# Patient Record
Sex: Female | Born: 1940 | Race: White | Hispanic: No | Marital: Married | Smoking: Former smoker
Health system: Southern US, Community
[De-identification: ages and names within clinical notes are randomized; demographics above are authoritative.]

## PROBLEM LIST (undated history)

## (undated) DIAGNOSIS — F0390 Unspecified dementia without behavioral disturbance: Secondary | ICD-10-CM

## (undated) DIAGNOSIS — I10 Essential (primary) hypertension: Secondary | ICD-10-CM

## (undated) DIAGNOSIS — I2510 Atherosclerotic heart disease of native coronary artery without angina pectoris: Secondary | ICD-10-CM

## (undated) DIAGNOSIS — I4892 Unspecified atrial flutter: Secondary | ICD-10-CM

## (undated) DIAGNOSIS — I480 Paroxysmal atrial fibrillation: Secondary | ICD-10-CM

## (undated) DIAGNOSIS — I6529 Occlusion and stenosis of unspecified carotid artery: Secondary | ICD-10-CM

## (undated) DIAGNOSIS — I340 Nonrheumatic mitral (valve) insufficiency: Secondary | ICD-10-CM

## (undated) DIAGNOSIS — F32A Depression, unspecified: Secondary | ICD-10-CM

## (undated) DIAGNOSIS — E785 Hyperlipidemia, unspecified: Secondary | ICD-10-CM

## (undated) DIAGNOSIS — F419 Anxiety disorder, unspecified: Secondary | ICD-10-CM

## (undated) DIAGNOSIS — K219 Gastro-esophageal reflux disease without esophagitis: Secondary | ICD-10-CM

## (undated) DIAGNOSIS — Z9289 Personal history of other medical treatment: Secondary | ICD-10-CM

## (undated) DIAGNOSIS — G459 Transient cerebral ischemic attack, unspecified: Secondary | ICD-10-CM

## (undated) DIAGNOSIS — F329 Major depressive disorder, single episode, unspecified: Secondary | ICD-10-CM

## (undated) DIAGNOSIS — Z720 Tobacco use: Secondary | ICD-10-CM

## (undated) HISTORY — DX: Anxiety disorder, unspecified: F41.9

## (undated) HISTORY — DX: Gastro-esophageal reflux disease without esophagitis: K21.9

## (undated) HISTORY — DX: Personal history of other medical treatment: Z92.89

## (undated) HISTORY — DX: Essential (primary) hypertension: I10

## (undated) HISTORY — DX: Nonrheumatic mitral (valve) insufficiency: I34.0

## (undated) HISTORY — PX: A FLUTTER ABLATION: SHX5348

## (undated) HISTORY — DX: Occlusion and stenosis of unspecified carotid artery: I65.29

## (undated) HISTORY — DX: Transient cerebral ischemic attack, unspecified: G45.9

## (undated) HISTORY — DX: Major depressive disorder, single episode, unspecified: F32.9

## (undated) HISTORY — DX: Depression, unspecified: F32.A

## (undated) HISTORY — PX: MITRAL VALVE REPLACEMENT: SHX147

## (undated) HISTORY — DX: Hyperlipidemia, unspecified: E78.5

---

## 2000-09-11 ENCOUNTER — Emergency Department (HOSPITAL_COMMUNITY): Admission: EM | Admit: 2000-09-11 | Discharge: 2000-09-11 | Payer: Self-pay | Admitting: Emergency Medicine

## 2000-12-05 ENCOUNTER — Emergency Department (HOSPITAL_COMMUNITY): Admission: EM | Admit: 2000-12-05 | Discharge: 2000-12-05 | Payer: Self-pay | Admitting: Emergency Medicine

## 2000-12-05 ENCOUNTER — Encounter: Payer: Self-pay | Admitting: Emergency Medicine

## 2001-04-08 ENCOUNTER — Other Ambulatory Visit: Admission: RE | Admit: 2001-04-08 | Discharge: 2001-04-08 | Payer: Self-pay | Admitting: Family Medicine

## 2003-04-30 ENCOUNTER — Inpatient Hospital Stay (HOSPITAL_COMMUNITY): Admission: EM | Admit: 2003-04-30 | Discharge: 2003-05-02 | Payer: Self-pay | Admitting: Emergency Medicine

## 2004-03-15 ENCOUNTER — Ambulatory Visit: Payer: Self-pay | Admitting: Internal Medicine

## 2004-03-29 ENCOUNTER — Ambulatory Visit: Payer: Self-pay | Admitting: Internal Medicine

## 2004-04-26 ENCOUNTER — Ambulatory Visit: Payer: Self-pay | Admitting: *Deleted

## 2004-05-24 ENCOUNTER — Ambulatory Visit: Payer: Self-pay | Admitting: Internal Medicine

## 2004-05-26 ENCOUNTER — Emergency Department (HOSPITAL_COMMUNITY): Admission: EM | Admit: 2004-05-26 | Discharge: 2004-05-26 | Payer: Self-pay | Admitting: Emergency Medicine

## 2004-06-21 ENCOUNTER — Ambulatory Visit: Payer: Self-pay | Admitting: Cardiology

## 2004-07-19 ENCOUNTER — Ambulatory Visit: Payer: Self-pay | Admitting: *Deleted

## 2004-08-16 ENCOUNTER — Ambulatory Visit: Payer: Self-pay | Admitting: Internal Medicine

## 2004-09-06 ENCOUNTER — Ambulatory Visit: Payer: Self-pay | Admitting: Cardiology

## 2004-09-06 ENCOUNTER — Emergency Department (HOSPITAL_COMMUNITY): Admission: EM | Admit: 2004-09-06 | Discharge: 2004-09-06 | Payer: Self-pay | Admitting: Emergency Medicine

## 2004-09-10 ENCOUNTER — Ambulatory Visit: Payer: Self-pay

## 2004-09-13 ENCOUNTER — Ambulatory Visit: Payer: Self-pay | Admitting: Internal Medicine

## 2004-10-04 ENCOUNTER — Ambulatory Visit: Payer: Self-pay | Admitting: Cardiology

## 2004-11-01 ENCOUNTER — Ambulatory Visit: Payer: Self-pay | Admitting: Cardiology

## 2004-11-15 ENCOUNTER — Ambulatory Visit: Payer: Self-pay | Admitting: Internal Medicine

## 2004-11-29 ENCOUNTER — Ambulatory Visit: Payer: Self-pay | Admitting: Cardiology

## 2004-12-13 ENCOUNTER — Ambulatory Visit: Payer: Self-pay | Admitting: Internal Medicine

## 2004-12-27 ENCOUNTER — Ambulatory Visit: Payer: Self-pay | Admitting: Internal Medicine

## 2005-01-09 ENCOUNTER — Ambulatory Visit: Payer: Self-pay | Admitting: *Deleted

## 2005-01-09 ENCOUNTER — Ambulatory Visit: Payer: Self-pay | Admitting: Cardiology

## 2005-01-24 ENCOUNTER — Ambulatory Visit: Payer: Self-pay | Admitting: Internal Medicine

## 2005-02-14 ENCOUNTER — Ambulatory Visit: Payer: Self-pay | Admitting: Cardiology

## 2005-03-07 ENCOUNTER — Ambulatory Visit: Payer: Self-pay | Admitting: Cardiology

## 2005-04-05 ENCOUNTER — Ambulatory Visit: Payer: Self-pay | Admitting: Internal Medicine

## 2005-05-02 ENCOUNTER — Ambulatory Visit: Payer: Self-pay | Admitting: Cardiology

## 2005-05-30 ENCOUNTER — Ambulatory Visit: Payer: Self-pay | Admitting: Cardiology

## 2005-06-13 ENCOUNTER — Ambulatory Visit: Payer: Self-pay | Admitting: Cardiology

## 2005-07-11 ENCOUNTER — Ambulatory Visit: Payer: Self-pay | Admitting: Cardiology

## 2005-08-08 ENCOUNTER — Ambulatory Visit: Payer: Self-pay | Admitting: Cardiology

## 2005-08-11 DIAGNOSIS — I2510 Atherosclerotic heart disease of native coronary artery without angina pectoris: Secondary | ICD-10-CM

## 2005-08-11 HISTORY — DX: Atherosclerotic heart disease of native coronary artery without angina pectoris: I25.10

## 2005-08-23 ENCOUNTER — Ambulatory Visit: Payer: Self-pay | Admitting: Cardiology

## 2005-08-23 ENCOUNTER — Inpatient Hospital Stay (HOSPITAL_COMMUNITY): Admission: EM | Admit: 2005-08-23 | Discharge: 2005-08-25 | Payer: Self-pay | Admitting: Emergency Medicine

## 2005-08-29 ENCOUNTER — Ambulatory Visit: Payer: Self-pay

## 2005-09-03 ENCOUNTER — Ambulatory Visit: Payer: Self-pay | Admitting: Cardiology

## 2005-09-05 ENCOUNTER — Ambulatory Visit (HOSPITAL_COMMUNITY): Admission: RE | Admit: 2005-09-05 | Discharge: 2005-09-05 | Payer: Self-pay | Admitting: Cardiology

## 2005-09-05 ENCOUNTER — Ambulatory Visit: Payer: Self-pay | Admitting: Cardiology

## 2005-09-12 ENCOUNTER — Ambulatory Visit: Payer: Self-pay | Admitting: Cardiology

## 2005-09-19 ENCOUNTER — Ambulatory Visit: Payer: Self-pay | Admitting: Cardiology

## 2005-10-02 ENCOUNTER — Ambulatory Visit: Payer: Self-pay | Admitting: Internal Medicine

## 2005-10-08 ENCOUNTER — Ambulatory Visit: Payer: Self-pay | Admitting: Internal Medicine

## 2005-10-08 ENCOUNTER — Ambulatory Visit: Payer: Self-pay | Admitting: Cardiology

## 2005-10-08 ENCOUNTER — Ambulatory Visit: Payer: Self-pay

## 2005-10-10 ENCOUNTER — Ambulatory Visit: Payer: Self-pay | Admitting: Internal Medicine

## 2005-10-10 ENCOUNTER — Ambulatory Visit (HOSPITAL_COMMUNITY): Admission: RE | Admit: 2005-10-10 | Discharge: 2005-10-11 | Payer: Self-pay | Admitting: Internal Medicine

## 2005-10-15 ENCOUNTER — Ambulatory Visit: Payer: Self-pay | Admitting: Cardiology

## 2005-10-24 ENCOUNTER — Ambulatory Visit: Payer: Self-pay | Admitting: Cardiology

## 2005-11-05 ENCOUNTER — Ambulatory Visit: Payer: Self-pay | Admitting: Internal Medicine

## 2005-11-14 ENCOUNTER — Ambulatory Visit: Payer: Self-pay | Admitting: Cardiology

## 2005-11-21 ENCOUNTER — Ambulatory Visit: Payer: Self-pay | Admitting: Cardiology

## 2005-12-05 ENCOUNTER — Ambulatory Visit: Payer: Self-pay | Admitting: Internal Medicine

## 2005-12-19 ENCOUNTER — Ambulatory Visit: Payer: Self-pay | Admitting: Cardiology

## 2005-12-26 ENCOUNTER — Ambulatory Visit: Payer: Self-pay | Admitting: Cardiology

## 2006-01-09 ENCOUNTER — Ambulatory Visit: Payer: Self-pay | Admitting: Cardiology

## 2006-01-30 ENCOUNTER — Ambulatory Visit: Payer: Self-pay | Admitting: Cardiology

## 2006-02-20 ENCOUNTER — Ambulatory Visit: Payer: Self-pay | Admitting: Cardiology

## 2006-03-07 ENCOUNTER — Ambulatory Visit: Payer: Self-pay | Admitting: Internal Medicine

## 2006-04-04 ENCOUNTER — Ambulatory Visit: Payer: Self-pay | Admitting: Cardiology

## 2006-04-10 ENCOUNTER — Ambulatory Visit: Payer: Self-pay | Admitting: Internal Medicine

## 2006-04-24 ENCOUNTER — Ambulatory Visit: Payer: Self-pay | Admitting: Cardiology

## 2006-05-15 ENCOUNTER — Ambulatory Visit: Payer: Self-pay | Admitting: Cardiology

## 2006-05-29 ENCOUNTER — Ambulatory Visit: Payer: Self-pay | Admitting: *Deleted

## 2006-06-12 ENCOUNTER — Ambulatory Visit: Payer: Self-pay | Admitting: Cardiology

## 2006-06-26 ENCOUNTER — Ambulatory Visit: Payer: Self-pay | Admitting: Internal Medicine

## 2006-07-17 ENCOUNTER — Ambulatory Visit: Payer: Self-pay | Admitting: Cardiology

## 2006-08-07 ENCOUNTER — Ambulatory Visit: Payer: Self-pay | Admitting: Cardiology

## 2006-08-28 ENCOUNTER — Ambulatory Visit: Payer: Self-pay | Admitting: Internal Medicine

## 2006-09-18 ENCOUNTER — Ambulatory Visit: Payer: Self-pay | Admitting: Cardiology

## 2006-10-09 ENCOUNTER — Ambulatory Visit: Payer: Self-pay | Admitting: *Deleted

## 2006-10-30 ENCOUNTER — Ambulatory Visit: Payer: Self-pay | Admitting: Internal Medicine

## 2006-11-20 ENCOUNTER — Ambulatory Visit: Payer: Self-pay | Admitting: Cardiovascular Disease

## 2006-12-18 ENCOUNTER — Ambulatory Visit: Payer: Self-pay | Admitting: Cardiovascular Disease

## 2007-01-15 ENCOUNTER — Ambulatory Visit: Payer: Self-pay | Admitting: Cardiology

## 2007-01-29 ENCOUNTER — Ambulatory Visit: Payer: Self-pay | Admitting: Cardiology

## 2007-02-12 ENCOUNTER — Ambulatory Visit: Payer: Self-pay | Admitting: Cardiology

## 2007-03-05 ENCOUNTER — Ambulatory Visit: Payer: Self-pay | Admitting: Cardiology

## 2007-03-18 ENCOUNTER — Ambulatory Visit: Payer: Self-pay | Admitting: Cardiology

## 2007-03-18 LAB — CONVERTED CEMR LAB
ALT: 22 units/L (ref 0–35)
AST: 21 units/L (ref 0–37)
Basophils Relative: 0 % (ref 0.0–1.0)
Bilirubin, Direct: 0.1 mg/dL (ref 0.0–0.3)
CO2: 32 meq/L (ref 19–32)
Calcium: 9.7 mg/dL (ref 8.4–10.5)
Chloride: 104 meq/L (ref 96–112)
Creatinine, Ser: 0.6 mg/dL (ref 0.4–1.2)
Eosinophils Relative: 2.8 % (ref 0.0–5.0)
GFR calc non Af Amer: 106 mL/min
Glucose, Bld: 104 mg/dL — ABNORMAL HIGH (ref 70–99)
HCT: 39.9 % (ref 36.0–46.0)
Neutrophils Relative %: 70.6 % (ref 43.0–77.0)
Platelets: 249 10*3/uL (ref 150–400)
RBC: 4.69 M/uL (ref 3.87–5.11)
RDW: 12.7 % (ref 11.5–14.6)
Sodium: 140 meq/L (ref 135–145)
Total Bilirubin: 0.6 mg/dL (ref 0.3–1.2)
Total Protein: 6.9 g/dL (ref 6.0–8.3)
WBC: 11.1 10*3/uL — ABNORMAL HIGH (ref 4.5–10.5)

## 2007-04-02 ENCOUNTER — Ambulatory Visit: Payer: Self-pay | Admitting: Cardiology

## 2007-04-30 ENCOUNTER — Ambulatory Visit: Payer: Self-pay | Admitting: Cardiology

## 2007-05-21 ENCOUNTER — Ambulatory Visit: Payer: Self-pay | Admitting: Internal Medicine

## 2007-05-28 ENCOUNTER — Ambulatory Visit: Payer: Self-pay | Admitting: Internal Medicine

## 2007-06-25 ENCOUNTER — Ambulatory Visit: Payer: Self-pay | Admitting: Cardiology

## 2007-07-06 ENCOUNTER — Ambulatory Visit: Payer: Self-pay | Admitting: Cardiology

## 2007-07-06 ENCOUNTER — Inpatient Hospital Stay (HOSPITAL_COMMUNITY): Admission: AD | Admit: 2007-07-06 | Discharge: 2007-07-07 | Payer: Self-pay | Admitting: Cardiology

## 2007-07-14 ENCOUNTER — Ambulatory Visit: Payer: Self-pay | Admitting: Internal Medicine

## 2007-07-14 ENCOUNTER — Inpatient Hospital Stay (HOSPITAL_COMMUNITY): Admission: RE | Admit: 2007-07-14 | Discharge: 2007-07-15 | Payer: Self-pay | Admitting: Internal Medicine

## 2007-07-15 ENCOUNTER — Encounter: Payer: Self-pay | Admitting: Internal Medicine

## 2007-07-17 ENCOUNTER — Ambulatory Visit: Payer: Self-pay | Admitting: Cardiology

## 2007-07-30 ENCOUNTER — Ambulatory Visit: Payer: Self-pay | Admitting: Cardiovascular Disease

## 2007-07-30 ENCOUNTER — Inpatient Hospital Stay (HOSPITAL_COMMUNITY): Admission: EM | Admit: 2007-07-30 | Discharge: 2007-07-31 | Payer: Self-pay | Admitting: Emergency Medicine

## 2007-08-05 ENCOUNTER — Ambulatory Visit: Payer: Self-pay

## 2007-08-13 ENCOUNTER — Ambulatory Visit: Payer: Self-pay | Admitting: Internal Medicine

## 2007-08-13 LAB — CONVERTED CEMR LAB: Prothrombin Time: 27 s — ABNORMAL HIGH (ref 10.9–13.3)

## 2007-08-20 ENCOUNTER — Ambulatory Visit: Payer: Self-pay | Admitting: Internal Medicine

## 2007-08-20 ENCOUNTER — Ambulatory Visit: Payer: Self-pay | Admitting: Cardiology

## 2007-08-20 ENCOUNTER — Encounter: Payer: Self-pay | Admitting: Cardiology

## 2007-08-20 ENCOUNTER — Ambulatory Visit: Payer: Self-pay

## 2007-09-10 ENCOUNTER — Ambulatory Visit: Payer: Self-pay | Admitting: Cardiology

## 2007-10-01 ENCOUNTER — Ambulatory Visit: Payer: Self-pay | Admitting: Internal Medicine

## 2007-10-15 ENCOUNTER — Ambulatory Visit: Payer: Self-pay | Admitting: Internal Medicine

## 2007-10-29 ENCOUNTER — Ambulatory Visit: Payer: Self-pay | Admitting: Cardiology

## 2007-11-19 ENCOUNTER — Ambulatory Visit: Payer: Self-pay | Admitting: Cardiology

## 2007-12-17 ENCOUNTER — Ambulatory Visit: Payer: Self-pay | Admitting: Cardiology

## 2007-12-28 ENCOUNTER — Ambulatory Visit: Payer: Self-pay | Admitting: Cardiology

## 2008-01-14 ENCOUNTER — Ambulatory Visit: Payer: Self-pay | Admitting: Internal Medicine

## 2008-02-04 ENCOUNTER — Ambulatory Visit: Payer: Self-pay | Admitting: Cardiology

## 2008-02-25 ENCOUNTER — Ambulatory Visit: Payer: Self-pay | Admitting: Internal Medicine

## 2008-03-10 ENCOUNTER — Ambulatory Visit: Payer: Self-pay | Admitting: Cardiovascular Disease

## 2008-03-31 ENCOUNTER — Ambulatory Visit: Payer: Self-pay | Admitting: Internal Medicine

## 2008-04-14 ENCOUNTER — Ambulatory Visit: Payer: Self-pay | Admitting: Internal Medicine

## 2008-05-12 ENCOUNTER — Ambulatory Visit: Payer: Self-pay | Admitting: Cardiology

## 2008-06-02 ENCOUNTER — Ambulatory Visit: Payer: Self-pay | Admitting: Cardiology

## 2008-06-30 ENCOUNTER — Ambulatory Visit: Payer: Self-pay | Admitting: Cardiology

## 2008-06-30 LAB — CONVERTED CEMR LAB: INR: 2.8 — ABNORMAL HIGH (ref 0.0–1.5)

## 2008-07-21 ENCOUNTER — Ambulatory Visit: Payer: Self-pay | Admitting: Cardiology

## 2008-08-18 ENCOUNTER — Ambulatory Visit: Payer: Self-pay | Admitting: Cardiology

## 2008-08-23 ENCOUNTER — Telehealth: Payer: Self-pay | Admitting: Cardiology

## 2008-08-25 ENCOUNTER — Ambulatory Visit: Payer: Self-pay | Admitting: Cardiology

## 2008-09-15 ENCOUNTER — Ambulatory Visit: Payer: Self-pay | Admitting: Cardiology

## 2008-09-21 DIAGNOSIS — F17200 Nicotine dependence, unspecified, uncomplicated: Secondary | ICD-10-CM | POA: Insufficient documentation

## 2008-09-21 DIAGNOSIS — Z9889 Other specified postprocedural states: Secondary | ICD-10-CM | POA: Insufficient documentation

## 2008-09-21 DIAGNOSIS — E785 Hyperlipidemia, unspecified: Secondary | ICD-10-CM

## 2008-09-21 DIAGNOSIS — I10 Essential (primary) hypertension: Secondary | ICD-10-CM

## 2008-09-21 DIAGNOSIS — I480 Paroxysmal atrial fibrillation: Secondary | ICD-10-CM | POA: Insufficient documentation

## 2008-09-22 ENCOUNTER — Ambulatory Visit: Payer: Self-pay | Admitting: Cardiology

## 2008-09-23 ENCOUNTER — Ambulatory Visit: Payer: Self-pay | Admitting: Cardiology

## 2008-09-23 LAB — CONVERTED CEMR LAB
Basophils Relative: 0.8 % (ref 0.0–3.0)
Bilirubin, Direct: 0 mg/dL (ref 0.0–0.3)
CO2: 30 meq/L (ref 19–32)
Calcium: 9.4 mg/dL (ref 8.4–10.5)
HCT: 44.7 % (ref 36.0–46.0)
HDL: 41.4 mg/dL (ref >39.00)
Hemoglobin: 15.3 g/dL — ABNORMAL HIGH (ref 12.0–15.0)
Lymphocytes Relative: 23.8 % (ref 12.0–46.0)
MCHC: 34.3 g/dL (ref 30.0–36.0)
Monocytes Relative: 7.3 % (ref 3.0–12.0)
Neutro Abs: 5.7 10*3/uL (ref 1.4–7.7)
Potassium: 4.4 meq/L (ref 3.5–5.1)
RBC: 4.97 M/uL (ref 3.87–5.11)
Sodium: 138 meq/L (ref 135–145)
Total Bilirubin: 0.9 mg/dL (ref 0.3–1.2)
VLDL: 22.8 mg/dL (ref 0.0–40.0)

## 2008-10-12 ENCOUNTER — Ambulatory Visit: Payer: Self-pay | Admitting: Internal Medicine

## 2008-11-10 ENCOUNTER — Ambulatory Visit: Payer: Self-pay | Admitting: Internal Medicine

## 2008-11-30 ENCOUNTER — Ambulatory Visit: Payer: Self-pay | Admitting: Cardiology

## 2008-12-26 ENCOUNTER — Encounter: Payer: Self-pay | Admitting: *Deleted

## 2008-12-29 ENCOUNTER — Ambulatory Visit: Payer: Self-pay | Admitting: Cardiology

## 2008-12-29 LAB — CONVERTED CEMR LAB
POC INR: 4
Prothrombin Time: 24.4 s

## 2009-01-26 ENCOUNTER — Ambulatory Visit: Payer: Self-pay | Admitting: Cardiology

## 2009-01-26 LAB — CONVERTED CEMR LAB: POC INR: 4.1

## 2009-02-23 ENCOUNTER — Ambulatory Visit: Payer: Self-pay | Admitting: Cardiology

## 2009-02-23 LAB — CONVERTED CEMR LAB: POC INR: 3.5

## 2009-03-14 ENCOUNTER — Encounter (INDEPENDENT_AMBULATORY_CARE_PROVIDER_SITE_OTHER): Payer: Self-pay | Admitting: *Deleted

## 2009-03-23 ENCOUNTER — Ambulatory Visit: Payer: Self-pay | Admitting: Cardiology

## 2009-04-08 ENCOUNTER — Inpatient Hospital Stay (HOSPITAL_COMMUNITY): Admission: EM | Admit: 2009-04-08 | Discharge: 2009-04-10 | Payer: Self-pay | Admitting: Emergency Medicine

## 2009-04-17 ENCOUNTER — Ambulatory Visit: Payer: Self-pay | Admitting: Family Medicine

## 2009-04-20 ENCOUNTER — Ambulatory Visit: Payer: Self-pay | Admitting: Cardiology

## 2009-04-28 ENCOUNTER — Encounter: Payer: Self-pay | Admitting: Internal Medicine

## 2009-04-28 ENCOUNTER — Ambulatory Visit: Payer: Self-pay | Admitting: Internal Medicine

## 2009-05-18 ENCOUNTER — Ambulatory Visit: Payer: Self-pay | Admitting: Cardiology

## 2009-06-15 ENCOUNTER — Ambulatory Visit: Payer: Self-pay | Admitting: Cardiovascular Disease

## 2009-07-11 ENCOUNTER — Ambulatory Visit: Payer: Self-pay | Admitting: Neurology

## 2009-07-13 ENCOUNTER — Ambulatory Visit: Payer: Self-pay | Admitting: Cardiovascular Disease

## 2009-08-10 ENCOUNTER — Ambulatory Visit: Payer: Self-pay | Admitting: Cardiovascular Disease

## 2009-09-07 ENCOUNTER — Ambulatory Visit: Payer: Self-pay | Admitting: Cardiology

## 2009-09-07 LAB — CONVERTED CEMR LAB: POC INR: 3

## 2009-10-05 ENCOUNTER — Ambulatory Visit: Payer: Self-pay | Admitting: Cardiovascular Disease

## 2009-10-05 LAB — CONVERTED CEMR LAB: POC INR: 2.8

## 2009-10-26 ENCOUNTER — Ambulatory Visit: Payer: Self-pay | Admitting: Cardiovascular Disease

## 2009-11-06 ENCOUNTER — Encounter: Payer: Self-pay | Admitting: Cardiovascular Disease

## 2009-11-07 ENCOUNTER — Ambulatory Visit: Payer: Self-pay | Admitting: Cardiology

## 2009-11-10 LAB — CONVERTED CEMR LAB
BUN: 9 mg/dL (ref 6–23)
Eosinophils Relative: 2.4 % (ref 0.0–5.0)
GFR calc non Af Amer: 94.24 mL/min (ref >60)
HCT: 42.8 % (ref 36.0–46.0)
Hemoglobin: 14.2 g/dL (ref 12.0–15.0)
Lymphs Abs: 2.5 10*3/uL (ref 0.7–4.0)
Monocytes Relative: 5.6 % (ref 3.0–12.0)
Platelets: 258 10*3/uL (ref 150.0–400.0)
Potassium: 4.5 meq/L (ref 3.5–5.1)
Sodium: 136 meq/L (ref 135–145)
TSH: 1.45 microintl units/mL (ref 0.35–5.50)
WBC: 10.4 10*3/uL (ref 4.5–10.5)

## 2009-11-22 ENCOUNTER — Ambulatory Visit: Payer: Self-pay | Admitting: Cardiovascular Disease

## 2009-12-13 ENCOUNTER — Ambulatory Visit: Payer: Self-pay | Admitting: Cardiovascular Disease

## 2009-12-13 LAB — CONVERTED CEMR LAB: POC INR: 3.7

## 2010-01-10 ENCOUNTER — Ambulatory Visit: Payer: Self-pay | Admitting: Cardiology

## 2010-01-10 LAB — CONVERTED CEMR LAB: POC INR: 3.2

## 2010-02-07 ENCOUNTER — Ambulatory Visit: Payer: Self-pay | Admitting: Cardiology

## 2010-02-07 LAB — CONVERTED CEMR LAB: POC INR: 3.2

## 2010-03-12 ENCOUNTER — Ambulatory Visit: Payer: Self-pay | Admitting: Cardiovascular Disease

## 2010-04-18 ENCOUNTER — Ambulatory Visit: Payer: Self-pay | Admitting: Cardiovascular Disease

## 2010-05-23 ENCOUNTER — Ambulatory Visit: Admission: RE | Admit: 2010-05-23 | Discharge: 2010-05-23 | Payer: Self-pay | Source: Home / Self Care

## 2010-05-23 LAB — CONVERTED CEMR LAB: POC INR: 4

## 2010-06-12 NOTE — Medication Information (Signed)
Summary: rov/ewj  Anticoagulant Therapy  Managed by: Cloyde Reams, RN, BSN Referring MD: Charlies Constable MD PCP: dr Elease Hashimoto in [place] Supervising MD: Mariah Milling Indication 1: Aortic Valve Replacement (ICD-V43.3) Indication 2: Mitral Valve Disorder (ICD-424.0) Lab Used: [facility] Site: [place] INR POC 3.2 INR RANGE 3.0-4.0  Dietary changes: no    Health status changes: no    Bleeding/hemorrhagic complications: no    Recent/future hospitalizations: no    Any changes in medication regimen? no    Recent/future dental: no  Any missed doses?: no       Is patient compliant with meds? yes       Allergies: 1)  ! Codeine  Anticoagulation Management History:      The patient is taking warfarin and comes in today for a routine follow up visit.  Positive risk factors for bleeding include an age of 44 years or older.  The bleeding index is 'intermediate risk'.  Positive CHADS2 values include History of HTN.  Negative CHADS2 values include Age > 69 years old.  The start date was 09/12/1997.  Her last INR was 2.8.  Anticoagulation responsible provider: gollan.  INR POC: 3.2.  Cuvette Lot#: [id number].  Exp: 02/2011.    Anticoagulation Management Assessment/Plan:      The patient's current anticoagulation dose is Coumadin 5 mg tabs: take as directed.  The target INR is 3 - 4.  The next INR is due 02/07/2010.  Anticoagulation instructions were given to patient.  Results were reviewed/authorized by Cloyde Reams, RN, BSN.  She was notified by Cloyde Reams RN.         Prior Anticoagulation Instructions: INR 3.7  Continue on same dosage 1 tablet daily except 1.5 tablets on Mondays, Wednesdays, and Saturdays.  Recheck in 4 weeks.    Current Anticoagulation Instructions: INR 3.2  Continue on same dosage 1 tablet daily except 1.5 tablets on Mondays, Wednesdays and Saturdays.  Recheck in 4 weeks.

## 2010-06-12 NOTE — Medication Information (Signed)
Summary: CCR/AMD  Anticoagulant Therapy  Managed by: Robyn Haber, RN, BSN Referring MD: Charlies Constable MD Supervising MD: Mariah Milling Indication 1: Aortic Valve Replacement (ICD-V43.3) Indication 2: Mitral Valve Disorder (ICD-424.0) Lab Used: [facility] Site: [place] INR POC 3.0 INR RANGE 3.0-4.0  Dietary changes: no    Health status changes: no    Bleeding/hemorrhagic complications: no    Recent/future hospitalizations: no    Any changes in medication regimen? no    Recent/future dental: no  Any missed doses?: no       Is patient compliant with meds? yes       Allergies: 1)  ! Codeine  Anticoagulation Management History:      The patient is taking warfarin and comes in today for a routine follow up visit.  Positive risk factors for bleeding include an age of 71 years or older.  The bleeding index is 'intermediate risk'.  Positive CHADS2 values include History of HTN.  Negative CHADS2 values include Age > 24 years old.  The start date was 09/12/1997.  Her last INR was 2.8.  Anticoagulation responsible provider: gollan.  INR POC: 3.0.    Anticoagulation Management Assessment/Plan:      The patient's current anticoagulation dose is Coumadin 5 mg tabs: take as directed.  The target INR is 3 - 4.  The next INR is due 10/05/2009.  Anticoagulation instructions were given to patient.  Results were reviewed/authorized by Robyn Haber, RN, BSN.  She was notified by Charlena Cross, RN, BSN.         Prior Anticoagulation Instructions: coumadin 7.5 mg today then coumadin 5 mg daily with 7.5 mg on Wed and Sat  Current Anticoagulation Instructions: The patient is to continue with the same dose of coumadin.  This dosage includes: coumadin 5 mg daily with 7.5mg  on Wed and Sat

## 2010-06-12 NOTE — Medication Information (Signed)
Summary: CCR/AMD  Anticoagulant Therapy  Managed by: Robyn Haber, RN, BSN Referring MD: Charlies Constable MD Supervising MD: Mariah Milling Indication 1: Aortic Valve Replacement (ICD-V43.3) Indication 2: Mitral Valve Disorder (ICD-424.0) Lab Used: [facility]  Site: [place] INR POC 3.2 INR RANGE 3.0-4.0  Dietary changes: no    Health status changes: no    Bleeding/hemorrhagic complications: no    Recent/future hospitalizations: no    Any changes in medication regimen? no    Recent/future dental: no  Any missed doses?: no       Is patient compliant with meds? yes       Allergies: 1)  ! Codeine  Anticoagulation Management History:      The patient is taking warfarin and comes in today for a routine follow up visit.  Positive risk factors for bleeding include an age of 6 years or older.  The bleeding index is 'intermediate risk'.  Positive CHADS2 values include History of HTN.  Negative CHADS2 values include Age > 21 years old.  The start date was 09/12/1997.  Her last INR was 2.8.  Anticoagulation responsible provider: Britini Garcilazo.  INR POC: 3.2.    Anticoagulation Management Assessment/Plan:      The patient's current anticoagulation dose is Coumadin 5 mg tabs: take as directed.  The target INR is 3 - 4.  The next INR is due 07/13/2009.  Anticoagulation instructions were given to patient.  Results were reviewed/authorized by Robyn Haber, RN, BSN.  She was notified by Charlena Cross, RN, BSN.         Prior Anticoagulation Instructions: coumadin 7.5 mg today then resume dose of 5mg  daily with 7.5 mg on Wed and Sat.  Current Anticoagulation Instructions: The patient is to continue with the same dose of coumadin.  This dosage includes: coumadin 5 mg daily with 7.5 mg on Wed and Sat.

## 2010-06-12 NOTE — Medication Information (Signed)
Summary: CCR/AMD  Anticoagulant Therapy  Managed by: Cloyde Reams, RN, BSN Referring MD: Charlies Constable MD Supervising MD: Mariah Milling Indication 1: Aortic Valve Replacement (ICD-V43.3) Indication 2: Mitral Valve Disorder (ICD-424.0) Lab Used: [facility]--[place]  Site: [place] INR POC 2.8 INR RANGE 3.0-4.0    Bleeding/hemorrhagic complications: no     Any changes in medication regimen? no     Any missed doses?: no       Is patient compliant with meds? yes       Allergies: 1)  ! Codeine  Anticoagulation Management History:      The patient is taking warfarin and comes in today for a routine follow up visit.  Positive risk factors for bleeding include an age of 71 years or older.  The bleeding index is 'intermediate risk'.  Positive CHADS2 values include History of HTN.  Negative CHADS2 values include Age > 71 years old.  The start date was 09/12/1997.  Her last INR was 2.8.  Anticoagulation responsible provider: gollan.  INR POC: 2.8.  Cuvette Lot#: [id number].  Exp: 12/2010.    Anticoagulation Management Assessment/Plan:      The patient's current anticoagulation dose is Coumadin 5 mg tabs: take as directed.  The target INR is 3 - 4.  The next INR is due 10/26/2009.  Anticoagulation instructions were given to patient.  Results were reviewed/authorized by Cloyde Reams, RN, BSN.  She was notified by Cloyde Reams, RN, BSN.         Prior Anticoagulation Instructions: The patient is to continue with the same dose of coumadin.  This dosage includes: coumadin 5 mg daily with 7.5mg  on Wed and Sat  Current Anticoagulation Instructions: INR 2.8  Take 1.5 tablets today, then resume same dosage 1 tablet daily except 1.5 tablets on Wednesdays and Saturdays.  Recheck in 3 weeks.

## 2010-06-12 NOTE — Medication Information (Signed)
Summary: CCR/AMD  Anticoagulant Therapy  Managed by: Robyn Haber, RN, BSN Referring MD: Charlies Constable MD Supervising MD: Mariah Milling Indication 1: Aortic Valve Replacement (ICD-V43.3) Indication 2: Mitral Valve Disorder (ICD-424.0) Lab Used: [facility] Site:  INR POC 2.5 INR RANGE 3.0-4.0  Dietary changes: no    Health status changes: no    Bleeding/hemorrhagic complications: no    Recent/future hospitalizations: no    Any changes in medication regimen? yes       Details: started on namenda  Recent/future dental: no  Any missed doses?: no       Is patient compliant with meds? yes       Allergies: 1)  ! Codeine  Anticoagulation Management History:      The patient is taking warfarin and comes in today for a routine follow up visit.  Positive risk factors for bleeding include an age of 21 years or older.  The bleeding index is 'intermediate risk'.  Positive CHADS2 values include History of HTN.  Negative CHADS2 values include Age > 47 years old.  The start date was 09/12/1997.  Her last INR was 2.8.  Anticoagulation responsible provider: gollan.  INR POC: 2.5.    Anticoagulation Management Assessment/Plan:      The patient's current anticoagulation dose is Coumadin 5 mg tabs: take as directed.  The target INR is 3 - 4.  The next INR is due 08/10/2009.  Anticoagulation instructions were given to patient.  Results were reviewed/authorized by Robyn Haber, RN, BSN.  She was notified by Charlena Cross, RN, BSN.         Prior Anticoagulation Instructions: The patient is to continue with the same dose of coumadin.  This dosage includes: coumadin 5 mg daily with 7.5 mg on Wed and Sat.  Current Anticoagulation Instructions: coumadin 10 mg today then resume 5 mg daily with 7.5 mg on Wed and Sat

## 2010-06-12 NOTE — Medication Information (Signed)
Summary: CCR/AMD  Anticoagulant Therapy  Managed by: Cloyde Reams, RN, BSN Referring MD: Charlies Constable MD Supervising MD: Mariah Milling Indication 1: Aortic Valve Replacement (ICD-V43.3) Indication 2: Mitral Valve Disorder (ICD-424.0) Lab Used: [facility] Site: [place] INR POC 3.4 INR RANGE 3.0-4.0  Dietary changes: no    Health status changes: no    Bleeding/hemorrhagic complications: no    Recent/future hospitalizations: no    Any changes in medication regimen? no     Any missed doses?: no       Is patient compliant with meds? yes       Allergies: 1)  ! Codeine  Anticoagulation Management History:      Positive risk factors for bleeding include an age of 71 years or older.  The bleeding index is 'intermediate risk'.  Positive CHADS2 values include History of HTN.  Negative CHADS2 values include Age > 71 years old.  The start date was 09/12/1997.  Her last INR was 2.8.  Anticoagulation responsible provider: Mardie Kellen.  INR POC: 3.4.  Cuvette Lot#: [id number].  Exp: 01/2011.    Anticoagulation Management Assessment/Plan:      The patient's current anticoagulation dose is Coumadin 5 mg tabs: take as directed.  The target INR is 3 - 4.  The next INR is due 10/26/2009.  Anticoagulation instructions were given to patient.  Results were reviewed/authorized by Cloyde Reams, RN, BSN.  She was notified by Benedict Needy, RN.         Prior Anticoagulation Instructions: INR 2.8  Take 1.5 tablets today, then resume same dosage 1 tablet daily except 1.5 tablets on Wednesdays and Saturdays.  Recheck in 3 weeks.    Current Anticoagulation Instructions: INR 3.4  Continue taking 1 tablet daily except 1.5 Wednesdays and Saturday. Recheck in 4 weeks.

## 2010-06-12 NOTE — Assessment & Plan Note (Signed)
Summary: F1Y   Visit Type:  Follow-up Primary Provider:  dr Elease Hashimoto in [place]  CC:  no complaints.  History of Present Illness: [name] [name] is 70 years old and return for followup management of valvular heart disease.she had a mitral valve replacement with a Bjork-Shiley valve years ago for much regurgitation. she also has a history of TIAs and aspirin has been added to her Coumadin. 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 She also has a history of paroxysmal atrial fibrillation and had atrial flutter ablation a number of years ago.  She says she's been doing quite well has had no chest pain shortness breath or palpitations. Her other medical problems include hypertension and hyperlipidemia. Unfortunately she continues to smoke a pack and a half of cigarettes per day.  Current Medications (verified): 1)  Coumadin 5 Mg Tabs (Warfarin Sodium) .... Take As Directed 2)  Lopressor 50 Mg Tabs (Metoprolol Tartrate) .... Take 1 Tablet By Mouth Twice A Day 3)  Adult Aspirin Ec Low Strength 81 Mg Tbec (Aspirin) .[name].. 1 Tablet Once Daily 4)  Temazepam 30 Mg Caps (Temazepam) .... At Bedtime 5)  Prilosec 20 Mg Cpdr (Omeprazole) .... Take One Tablet Once Daily 6)  Celexa 40 Mg Tabs (Citalopram Hydrobromide) .[name].. 1 Tab Once Daily 7)  Vitamin C Cr 500 Mg Cr-Caps (Ascorbic Acid) .... Take One Cap Once Daily 8)  Eql One Daily Womens  Tabs (Multiple Vitamins-Calcium) .... Take One Tablet Once Daily 9)  Namenda 10 Mg Tabs (Memantine Hcl) .... Take 1 Tablet By Mouth Two Times A Day 10)  Vitamin B-12 500 Mcg  Tabs (Cyanocobalamin) .[name].. 1 Tab Once Daily  Allergies (verified): 1)  ! Codeine  Past History:  Past Medical History: Reviewed history from 09/21/2008 and no changes required.  1. History of nonobstructive coronary artery disease by       catheterization, April 2007.   2. History of atrial fibrillation on chronic Coumadin.   3. Depression.   4.  History of atrial flutter, status post radiofrequency ablation x2.   5. History of mitral regurgitation status post Bjork Shiley mechanical mitral valve       replacement in 1980   6. Gastroesophageal reflux disease.   7. History of transient ischemic attack.   8. Hypertension.   9. Anxiety.   10.Ongoing tobacco abuse.   11.Hyperlipidemia.   Review of Systems       ROS is negative except as outlined in HPI.   Vital Signs:  Patient profile:   70 year old female Height:      66 inches Weight:      158 pounds BMI:     25.59 Pulse rate:   68 / minute BP sitting:   146 / 74  (left arm) Cuff size:   regular  Vitals Entered By: Burnett Kanaris, CNA (November 07, 2009 2:35 PM)  Physical Exam  Additional Exam:  Gen. Well-nourished, in no distress   Neck: No JVD, thyroid not enlarged, no carotid bruits Lungs: No tachypnea, clear without rales, rhonchi or wheezes Cardiovascular: Rhythm regular, PMI not displaced,  normal prosthetic closure sound and normal prosthetic opening sound, short systolic murmur at the left sternal edge, no peripheral edema, pulses normal in all 4 extremities. Abdomen: BS normal, abdomen soft and non-tender without masses or organomegaly, no hepatosplenomegaly. MS: No deformities, no cyanosis or clubbing   Neuro:  No focal sns   Skin:  no lesions  Impression & Recommendations:  Problem # 1:  MITRAL VALVE REPLACEMENT, HX OF (ICD-V15.1) She has a mitral prosthesis. She has had no cardiovascular symptoms. She is on both Coumadin and aspirin because of previous TIAs thought to be related to her prosthetic valve. This polyp is stable.  We will arrange followup with Dr. Shirlee Latch in one year. Orders: EKG w/ Interpretation (93000) TLB-BMP (Basic Metabolic Panel-BMET) (80048-METABOL) TLB-CBC Platelet - w/Differential (85025-CBCD) TLB-TSH (Thyroid Stimulating Hormone) (84443-TSH)  Problem # 2:  ATRIAL FIBRILLATION (ICD-427.31) She has a history of paroxysmal  fibrillation. She has also had previous atrial flutter ablation. She's had no recent symptomatic recurrences. She is on chronic Coumadin therapy. This problem appears stable. Her updated medication list for this problem includes:    Coumadin 5 Mg Tabs (Warfarin sodium) .[name]... Take as directed    Lopressor 50 Mg Tabs (Metoprolol tartrate) .[name]... Take 1 tablet by mouth twice a day    Adult Aspirin Ec Low Strength 81 Mg Tbec (Aspirin) .[name]... 1 tablet once daily  Orders: EKG w/ Interpretation (93000) TLB-BMP (Basic Metabolic Panel-BMET) (80048-METABOL) TLB-CBC Platelet - w/Differential (85025-CBCD) TLB-TSH (Thyroid Stimulating Hormone) (84443-TSH)  Her updated medication list for this problem includes:    Coumadin 5 Mg Tabs (Warfarin sodium) .[name]... Take as directed    Lopressor 50 Mg Tabs (Metoprolol tartrate) .[name]... Take 1 tablet by mouth twice a day    Adult Aspirin Ec Low Strength 81 Mg Tbec (Aspirin) .[name]... 1 tablet once daily  Problem # 3:  TOBACCO ABUSE (ICD-305.1) Unfortunately, she continues to smoke a pack and a half of cigarettes per day. We counseled her regarding this.  Patient Instructions: 1)  Labs today- bmet/cbc/tsh (427.32;v43.3) 2)  Your physician wants you to follow-up in: 1 year with Dr. Shirlee Latch.  You will receive a reminder letter in the mail two months in advance. If you don't receive a letter, please call our office to schedule the follow-up appointment. 3)  Your physician recommends that you continue on your current medications as directed. Please refer to the Current Medication list given to you today.

## 2010-06-12 NOTE — Medication Information (Signed)
Summary: CCR/AMD  Anticoagulant Therapy  Managed by: Cloyde Reams, RN, BSN Referring MD: Charlies Constable MD PCP: dr Elease Hashimoto in [place] Supervising MD: Mariah Milling Indication 1: Aortic Valve Replacement (ICD-V43.3) Indication 2: Mitral Valve Disorder (ICD-424.0) Lab Used: [facility] Site: [place] INR POC 2.6 INR RANGE 3.0-4.0  Dietary changes: no    Health status changes: no    Bleeding/hemorrhagic complications: no    Recent/future hospitalizations: no    Any changes in medication regimen? no    Recent/future dental: no  Any missed doses?: no       Is patient compliant with meds? yes       Allergies: 1)  ! Codeine  Anticoagulation Management History:      The patient is taking warfarin and comes in today for a routine follow up visit.  Positive risk factors for bleeding include an age of 38 years or older.  The bleeding index is 'intermediate risk'.  Positive CHADS2 values include History of HTN.  Negative CHADS2 values include Age > 49 years old.  The start date was 09/12/1997.  Her last INR was 2.8.  Anticoagulation responsible provider: Haydn Hutsell.  INR POC: 2.6.  Cuvette Lot#: [id number].  Exp: 01/2011.    Anticoagulation Management Assessment/Plan:      The patient's current anticoagulation dose is Coumadin 5 mg tabs: take as directed.  The target INR is 3 - 4.  The next INR is due 12/13/2009.  Anticoagulation instructions were given to patient.  Results were reviewed/authorized by Cloyde Reams, RN, BSN.  She was notified by Cloyde Reams RN.         Prior Anticoagulation Instructions: INR 3.4  Continue taking 1 tablet daily except 1.5 Wednesdays and Saturday. Recheck in 4 weeks.  Current Anticoagulation Instructions: INR 2.6  Take 2 tablets today, then start taking 1 tablet daily except 1.5 tablets on Mondays, Wednesdays, and Saturdays.  Recheck in 3 weeks.

## 2010-06-12 NOTE — Medication Information (Signed)
Summary: CCR/AMD  Anticoagulant Therapy  Managed by: Robyn Haber, RN, BSN Referring MD: Charlies Constable MD Supervising MD: Mariah Milling Indication 1: Aortic Valve Replacement (ICD-V43.3) Indication 2: Mitral Valve Disorder (ICD-424.0) Lab Used: [facility] Site: [place] INR POC 2.8 INR RANGE 3.0-4.0  Dietary changes: no    Health status changes: no    Bleeding/hemorrhagic complications: no    Recent/future hospitalizations: no    Any changes in medication regimen? no    Recent/future dental: no  Any missed doses?: no       Is patient compliant with meds? yes       Allergies: 1)  ! Codeine  Anticoagulation Management History:      The patient is taking warfarin and comes in today for a routine follow up visit.  Positive risk factors for bleeding include an age of 71 years or older.  The bleeding index is 'intermediate risk'.  Positive CHADS2 values include History of HTN.  Negative CHADS2 values include Age > 7 years old.  The start date was 09/12/1997.  Her last INR was 2.8.  Anticoagulation responsible provider: Caprice Wasko.  INR POC: 2.8.    Anticoagulation Management Assessment/Plan:      The patient's current anticoagulation dose is Coumadin 5 mg tabs: take as directed.  The target INR is 3 - 4.  The next INR is due 09/07/2009.  Anticoagulation instructions were given to patient.  Results were reviewed/authorized by Robyn Haber, RN, BSN.  She was notified by Charlena Cross, RN, BSN.         Prior Anticoagulation Instructions: coumadin 10 mg today then resume 5 mg daily with 7.5 mg on Wed and Sat  Current Anticoagulation Instructions: coumadin 7.5 mg today then coumadin 5 mg daily with 7.5 mg on Wed and Sat

## 2010-06-12 NOTE — Medication Information (Signed)
Summary: CCR/AMD  Anticoagulant Therapy  Managed by: Bethena Midget, RN, BSN Referring MD: Charlies Constable MD PCP: dr Elease Hashimoto in [place] Supervising MD: Mariah Milling Indication 1: Aortic Valve Replacement (ICD-V43.3) Indication 2: Mitral Valve Disorder (ICD-424.0) Lab Used: [facility] Site: [place] INR POC 3.7 INR RANGE 3.0-4.0  Dietary changes: no    Health status changes: no    Bleeding/hemorrhagic complications: no    Recent/future hospitalizations: no    Any changes in medication regimen? no    Recent/future dental: no  Any missed doses?: no       Is patient compliant with meds? yes       Allergies: 1)  ! Codeine  Anticoagulation Management History:      The patient is taking warfarin and comes in today for a routine follow up visit.  Positive risk factors for bleeding include an age of 71 years or older.  The bleeding index is 'intermediate risk'.  Positive CHADS2 values include History of HTN.  Negative CHADS2 values include Age > 71 years old.  The start date was 09/12/1997.  Her last INR was 2.8.  Anticoagulation responsible [name] [name]: Gollan.  INR POC: 3.7.  Cuvette Lot#: [id number].  Exp: 04/2011.    Anticoagulation Management Assessment/Plan:      The patient's current anticoagulation dose is Coumadin 5 mg tabs: take as directed.  The target INR is 3 - 4.  The next INR is due 05/23/2010.  Anticoagulation instructions were given to patient.  Results were reviewed/authorized by Bethena Midget, RN, BSN.  She was notified by Bethena Midget, RN, BSN.         Prior Anticoagulation Instructions: INR 3.3  Continue taking 1 tab daily except for 1.5 tabs on Monday Wednesday and Saturday. Recheck in 4 weeks.   Current Anticoagulation Instructions: INR 3.7 Continue 5mg s everyday except 7.5mg s on Mondays, Wednesdays and Saturdays. Recheck in 4 weeks.

## 2010-06-12 NOTE — Medication Information (Signed)
Summary: CCR/AMD  Anticoagulant Therapy  Managed by: Cloyde Reams, RN, BSN Referring MD: Charlies Constable MD PCP: dr Elease Hashimoto in [place] Supervising MD: Shirlee Latch MD, [place] Indication 1: Aortic Valve Replacement (ICD-V43.3) Indication 2: Mitral Valve Disorder (ICD-424.0) Lab Used: [facility] Site: [place] INR POC 3.3 INR RANGE 3.0-4.0  Dietary changes: no     Bleeding/hemorrhagic complications: no     Any changes in medication regimen? no    Recent/future dental: no  Any missed doses?: no       Is patient compliant with meds? yes       Allergies: 1)  ! Codeine  Anticoagulation Management History:      The patient is taking warfarin and comes in today for a routine follow up visit.  Positive risk factors for bleeding include an age of 18 years or older.  The bleeding index is 'intermediate risk'.  Positive CHADS2 values include History of HTN.  Negative CHADS2 values include Age > 70 years old.  The start date was 09/12/1997.  Her last INR was 2.8.  Anticoagulation responsible Kenyatta Keidel: Shirlee Latch MD, [place].  INR POC: 3.3.  Cuvette Lot#: [id number].  Exp: 03/2011.    Anticoagulation Management Assessment/Plan:      The patient's current anticoagulation dose is Coumadin 5 mg tabs: take as directed.  The target INR is 3 - 4.  The next INR is due 04/11/2010.  Anticoagulation instructions were given to patient.  Results were reviewed/authorized by Cloyde Reams, RN, BSN.  She was notified by Benedict Needy, RN.         Prior Anticoagulation Instructions: INR 3.2  Continue on same dosage 1 tablet daily except 1.5 tablets on Mondays, Wednesdays, and Saturdays.  Recheck in 4 weeks.    Current Anticoagulation Instructions: INR 3.3  Continue taking 1 tab daily except for 1.5 tabs on Monday Wednesday and Saturday. Recheck in 4 weeks.

## 2010-06-12 NOTE — Medication Information (Signed)
Summary: ccr  Anticoagulant Therapy  Managed by: Robyn Haber, RN, BSN Referring MD: Charlies Constable MD Supervising MD: Gala Romney MD, Reuel Boom Indication 1: Aortic Valve Replacement (ICD-V43.3) Indication 2: Mitral Valve Disorder (ICD-424.0) Lab Used: [facility]  Site: [place] INR RANGE 3.0-4.0  Dietary changes: no    Health status changes: no    Bleeding/hemorrhagic complications: no    Recent/future hospitalizations: no    Any changes in medication regimen? no    Recent/future dental: no  Any missed doses?: no       Is patient compliant with meds? yes       Allergies: 1)  ! Codeine  Anticoagulation Management History:      The patient is taking warfarin and comes in today for a routine follow up visit.  Positive risk factors for bleeding include an age of 71 years or older.  The bleeding index is 'intermediate risk'.  Positive CHADS2 values include History of HTN.  Negative CHADS2 values include Age > 11 years old.  The start date was 09/12/1997.  Her last INR was 2.8.  Anticoagulation responsible provider: Bensimhon MD, Reuel Boom.    Anticoagulation Management Assessment/Plan:      The patient's current anticoagulation dose is Coumadin 5 mg tabs: take as directed.  The target INR is 3 - 4.  The next INR is due 06/15/2009.  Anticoagulation instructions were given to patient.  Results were reviewed/authorized by Robyn Haber, RN, BSN.         Prior Anticoagulation Instructions: coumadin 5 mg daily with 7.5 mg on Thurs and Sat  Current Anticoagulation Instructions: coumadin 7.5 mg today then resume dose of 5mg  daily with 7.5 mg on Wed and Sat.

## 2010-06-12 NOTE — Miscellaneous (Signed)
Summary: Coumadin follow-up  Clinical Lists Changes  Observations: Added new observation of NEXT PT: 11/23/2009 (11/06/2009 14:38)

## 2010-06-12 NOTE — Medication Information (Signed)
Summary: rov/ewj  Anticoagulant Therapy  Managed by: Cloyde Reams, RN, BSN Referring MD: Charlies Constable MD PCP: dr Elease Hashimoto in [place] Supervising MD: Shirlee Latch MD, Quintavius Niebuhr Indication 1: Aortic Valve Replacement (ICD-V43.3) Indication 2: Mitral Valve Disorder (ICD-424.0) Lab Used: [facility] Site: [place] INR POC 3.2 INR RANGE 3.0-4.0  Dietary changes: no    Health status changes: no    Bleeding/hemorrhagic complications: no    Recent/future hospitalizations: no    Any changes in medication regimen? no    Recent/future dental: no  Any missed doses?: no       Is patient compliant with meds? yes       Allergies: 1)  ! Codeine  Anticoagulation Management History:      The patient is taking warfarin and comes in today for a routine follow up visit.  Positive risk factors for bleeding include an age of 11 years or older.  The bleeding index is 'intermediate risk'.  Positive CHADS2 values include History of HTN.  Negative CHADS2 values include Age > 43 years old.  The start date was 09/12/1997.  Her last INR was 2.8.  Anticoagulation responsible provider: Shirlee Latch MD, Kenry Daubert.  INR POC: 3.2.  Cuvette Lot#: [id number].  Exp: 03/2011.    Anticoagulation Management Assessment/Plan:      The patient's current anticoagulation dose is Coumadin 5 mg tabs: take as directed.  The target INR is 3 - 4.  The next INR is due 03/07/2010.  Anticoagulation instructions were given to patient.  Results were reviewed/authorized by Cloyde Reams, RN, BSN.  She was notified by Cloyde Reams RN.         Prior Anticoagulation Instructions: INR 3.2  Continue on same dosage 1 tablet daily except 1.5 tablets on Mondays, Wednesdays and Saturdays.  Recheck in 4 weeks.    Current Anticoagulation Instructions: INR 3.2  Continue on same dosage 1 tablet daily except 1.5 tablets on Mondays, Wednesdays, and Saturdays.  Recheck in 4 weeks.

## 2010-06-12 NOTE — Medication Information (Signed)
Summary: ccr/ewj  Anticoagulant Therapy  Managed by: Cloyde Reams, RN, BSN Referring MD: Charlies Constable MD PCP: dr Elease Hashimoto in [place] Supervising MD: Mariah Milling Indication 1: Aortic Valve Replacement (ICD-V43.3) Indication 2: Mitral Valve Disorder (ICD-424.0) Lab Used: [facility] Site:  INR POC 3.7 INR RANGE 3.0-4.0  Dietary changes: no    Health status changes: no    Bleeding/hemorrhagic complications: no    Recent/future hospitalizations: no    Any changes in medication regimen? no    Recent/future dental: no  Any missed doses?: no       Is patient compliant with meds? yes       Allergies: 1)  ! Codeine  Anticoagulation Management History:      The patient is taking warfarin and comes in today for a routine follow up visit.  Positive risk factors for bleeding include an age of 45 years or older.  The bleeding index is 'intermediate risk'.  Positive CHADS2 values include History of HTN.  Negative CHADS2 values include Age > 40 years old.  The start date was 09/12/1997.  Her last INR was 2.8.  Anticoagulation responsible provider: Dallen Bunte.  INR POC: 3.7.  Cuvette Lot#: [id number].  Exp: 10/2010.    Anticoagulation Management Assessment/Plan:      The patient's current anticoagulation dose is Coumadin 5 mg tabs: take as directed.  The target INR is 3 - 4.  The next INR is due 01/10/2010.  Anticoagulation instructions were given to patient.  Results were reviewed/authorized by Cloyde Reams, RN, BSN.  She was notified by Cloyde Reams RN.         Prior Anticoagulation Instructions: INR 2.6  Take 2 tablets today, then start taking 1 tablet daily except 1.5 tablets on Mondays, Wednesdays, and Saturdays.  Recheck in 3 weeks.    Current Anticoagulation Instructions: INR 3.7  Continue on same dosage 1 tablet daily except 1.5 tablets on Mondays, Wednesdays, and Saturdays.  Recheck in 4 weeks.

## 2010-06-14 NOTE — Medication Information (Signed)
Summary: rov/tm  Anticoagulant Therapy  Managed by: Cloyde Reams, RN, BSN Referring MD: Charlies Constable MD PCP: dr Elease Hashimoto in [place] Supervising MD: Mariah Milling Indication 1: Aortic Valve Replacement (ICD-V43.3) Indication 2: Mitral Valve Disorder (ICD-424.0) Lab Used: [facility] Site: [place] INR POC 4.0 INR RANGE 3.0-4.0  Dietary changes: no    Health status changes: no    Bleeding/hemorrhagic complications: no    Recent/future hospitalizations: no    Any changes in medication regimen? no    Recent/future dental: no  Any missed doses?: no       Is patient compliant with meds? yes       Allergies: 1)  ! Codeine  Anticoagulation Management History:      The patient is taking warfarin and comes in today for a routine follow up visit.  Positive risk factors for bleeding include an age of 71 years or older.  The bleeding index is 'intermediate risk'.  Positive CHADS2 values include History of HTN.  Negative CHADS2 values include Age > 30 years old.  The start date was 09/12/1997.  Her last INR was 2.8.  Anticoagulation responsible provider: Darrien Laakso.  INR POC: 4.0.  Cuvette Lot#: [id number].  Exp: 06/2011.    Anticoagulation Management Assessment/Plan:      The patient's current anticoagulation dose is Coumadin 5 mg tabs: take as directed.  The target INR is 3 - 4.  The next INR is due 06/27/2010.  Anticoagulation instructions were given to patient.  Results were reviewed/authorized by Cloyde Reams, RN, BSN.  She was notified by Cloyde Reams RN.         Prior Anticoagulation Instructions: INR 3.7 Continue 5mg s everyday except 7.5mg s on Mondays, Wednesdays and Saturdays. Recheck in 4 weeks.   Current Anticoagulation Instructions: INR 4.0  Continue on same dosage 1 tablet daily except 1.5 tablets on Mondays, Wednesdays, and Saturdays.  Recheck in 4 weeks.

## 2010-06-27 ENCOUNTER — Encounter (INDEPENDENT_AMBULATORY_CARE_PROVIDER_SITE_OTHER): Payer: Medicare PPO

## 2010-06-27 ENCOUNTER — Encounter: Payer: Self-pay | Admitting: Cardiovascular Disease

## 2010-06-27 DIAGNOSIS — Z7901 Long term (current) use of anticoagulants: Secondary | ICD-10-CM

## 2010-06-27 DIAGNOSIS — I059 Rheumatic mitral valve disease, unspecified: Secondary | ICD-10-CM

## 2010-07-03 ENCOUNTER — Encounter: Payer: Self-pay | Admitting: Cardiovascular Disease

## 2010-07-04 NOTE — Medication Information (Signed)
Summary: ROV/EWJ/AMD  Anticoagulant Therapy  Managed by: Bethena Midget, RN, BSN Referring MD: Charlies Constable MD PCP: dr Elease Hashimoto in [place] Supervising MD: Mariah Milling Indication 1: Aortic Valve Replacement (ICD-V43.3) Indication 2: Mitral Valve Disorder (ICD-424.0) Lab Used: [facility] Site: [place] INR POC 4.3 INR RANGE 3.0-4.0  Dietary changes: yes       Details: Ate less green veggies  Health status changes: no    Bleeding/hemorrhagic complications: no    Recent/future hospitalizations: no    Any changes in medication regimen? no    Recent/future dental: no  Any missed doses?: no       Is patient compliant with meds? yes       Allergies: 1)  ! Codeine  Anticoagulation Management History:      The patient is taking warfarin and comes in today for a routine follow up visit.  Positive risk factors for bleeding include an age of 70 years or older.  The bleeding index is 'intermediate risk'.  Positive CHADS2 values include History of HTN.  Negative CHADS2 values include Age > 10 years old.  The start date was 09/12/1997.  Her last INR was 2.8.  Anticoagulation responsible provider: Buzz Axel.  INR POC: 4.3.  Cuvette Lot#: [id number].  Exp: 05/2011.    Anticoagulation Management Assessment/Plan:      The patient's current anticoagulation dose is Coumadin 5 mg tabs: take as directed.  The target INR is 3 - 4.  The next INR is due 07/25/2010.  Anticoagulation instructions were given to patient.  Results were reviewed/authorized by Bethena Midget, RN, BSN.  She was notified by Bethena Midget, RN, BSN.         Prior Anticoagulation Instructions: INR 4.0  Continue on same dosage 1 tablet daily except 1.5 tablets on Mondays, Wednesdays, and Saturdays.  Recheck in 4 weeks.   Current Anticoagulation Instructions: INR 4.3 Today only take 1/2 pill then resume 1 pill everyday except 1.5 pills on Mondays, Wednesdays and Saturdays. Recheck in 4 weeks.

## 2010-07-25 ENCOUNTER — Encounter: Payer: Self-pay | Admitting: Cardiovascular Disease

## 2010-07-25 ENCOUNTER — Encounter (INDEPENDENT_AMBULATORY_CARE_PROVIDER_SITE_OTHER): Payer: Medicare PPO

## 2010-07-25 DIAGNOSIS — Z7901 Long term (current) use of anticoagulants: Secondary | ICD-10-CM

## 2010-07-25 DIAGNOSIS — I359 Nonrheumatic aortic valve disorder, unspecified: Secondary | ICD-10-CM

## 2010-07-31 NOTE — Medication Information (Signed)
Summary: rov/tm  Anticoagulant Therapy  Managed by: Cloyde Reams, RN, BSN Referring MD: Charlies Constable MD PCP: dr Elease Hashimoto in [place] Supervising MD: Mariah Milling Indication 1: Aortic Valve Replacement (ICD-V43.3) Indication 2: Mitral Valve Disorder (ICD-424.0) Lab Used: [facility] Site:  INR POC 2.5 INR RANGE 3.0-4.0  Dietary changes: no    Health status changes: no    Bleeding/hemorrhagic complications: no    Recent/future hospitalizations: no    Any changes in medication regimen? no    Recent/future dental: no  Any missed doses?: no       Is patient compliant with meds? yes       Allergies: 1)  ! Codeine  Anticoagulation Management History:      The patient is taking warfarin and comes in today for a routine follow up visit.  Positive risk factors for bleeding include an age of 70 years or older.  The bleeding index is 'intermediate risk'.  Positive CHADS2 values include History of HTN.  Negative CHADS2 values include Age > 55 years old.  The start date was 09/12/1997.  Her last INR was 2.8.  Anticoagulation responsible provider: [place].  INR POC: 2.5.  Cuvette Lot#: [id number].  Exp: 05/2011.    Anticoagulation Management Assessment/Plan:      The patient's current anticoagulation dose is Coumadin 5 mg tabs: take as directed.  The target INR is 3 - 4.  The next INR is due 08/15/2010.  Anticoagulation instructions were given to patient.  Results were reviewed/authorized by Cloyde Reams, RN, BSN.  She was notified by Cloyde Reams RN.         Prior Anticoagulation Instructions: INR 4.3 Today only take 1/2 pill then resume 1 pill everyday except 1.5 pills on Mondays, Wednesdays and Saturdays. Recheck in 4 weeks.   Current Anticoagulation Instructions: INR 2.5  Take 10mg  today, then resume 5mg  daily except 7.5mg  on Mondays, Wednesdays, and Saturdays. Recheck in 3 weeks.

## 2010-08-07 ENCOUNTER — Encounter: Payer: Self-pay | Admitting: Cardiology

## 2010-08-07 DIAGNOSIS — G459 Transient cerebral ischemic attack, unspecified: Secondary | ICD-10-CM

## 2010-08-07 DIAGNOSIS — I4891 Unspecified atrial fibrillation: Secondary | ICD-10-CM

## 2010-08-07 DIAGNOSIS — Z9889 Other specified postprocedural states: Secondary | ICD-10-CM

## 2010-08-15 ENCOUNTER — Ambulatory Visit (INDEPENDENT_AMBULATORY_CARE_PROVIDER_SITE_OTHER): Payer: Medicare PPO | Admitting: Emergency Medicine

## 2010-08-15 DIAGNOSIS — G459 Transient cerebral ischemic attack, unspecified: Secondary | ICD-10-CM

## 2010-08-15 DIAGNOSIS — I4891 Unspecified atrial fibrillation: Secondary | ICD-10-CM

## 2010-08-15 DIAGNOSIS — Z7901 Long term (current) use of anticoagulants: Secondary | ICD-10-CM | POA: Insufficient documentation

## 2010-08-15 DIAGNOSIS — Z9889 Other specified postprocedural states: Secondary | ICD-10-CM

## 2010-08-15 LAB — COMPREHENSIVE METABOLIC PANEL
ALT: 24 U/L (ref 0–35)
ALT: 31 U/L (ref 0–35)
AST: 25 U/L (ref 0–37)
Alkaline Phosphatase: 118 U/L — ABNORMAL HIGH (ref 39–117)
Alkaline Phosphatase: 79 U/L (ref 39–117)
BUN: 5 mg/dL — ABNORMAL LOW (ref 6–23)
CO2: 21 mEq/L (ref 19–32)
CO2: 24 mEq/L (ref 19–32)
Chloride: 101 mEq/L (ref 96–112)
GFR calc Af Amer: >60 mL/min (ref >60)
GFR calc non Af Amer: >60 mL/min (ref >60)
GFR calc non Af Amer: >60 mL/min (ref >60)
Glucose, Bld: 112 mg/dL — ABNORMAL HIGH (ref 70–99)
Potassium: 3.2 mEq/L — ABNORMAL LOW (ref 3.5–5.1)
Sodium: 133 mEq/L — ABNORMAL LOW (ref 135–145)
Sodium: 137 mEq/L (ref 135–145)
Total Bilirubin: 0.9 mg/dL (ref 0.3–1.2)
Total Bilirubin: 1.1 mg/dL (ref 0.3–1.2)

## 2010-08-15 LAB — DIFFERENTIAL
Basophils Absolute: 0 10*3/uL (ref 0.0–0.1)
Basophils Absolute: 0 10*3/uL (ref 0.0–0.1)
Basophils Absolute: 0.1 10*3/uL (ref 0.0–0.1)
Basophils Relative: 0 % (ref 0–1)
Basophils Relative: 0 % (ref 0–1)
Basophils Relative: 0 % (ref 0–1)
Eosinophils Absolute: 0.1 10*3/uL (ref 0.0–0.7)
Eosinophils Absolute: 0.2 10*3/uL (ref 0.0–0.7)
Eosinophils Relative: 1 % (ref 0–5)
Lymphocytes Relative: 12 % (ref 12–46)
Monocytes Absolute: 0.5 10*3/uL (ref 0.1–1.0)
Monocytes Relative: 7 % (ref 3–12)
Neutro Abs: 6.1 10*3/uL (ref 1.7–7.7)
Neutro Abs: 6.6 10*3/uL (ref 1.7–7.7)
Neutrophils Relative %: 77 % (ref 43–77)
Neutrophils Relative %: 81 % — ABNORMAL HIGH (ref 43–77)

## 2010-08-15 LAB — URINALYSIS, ROUTINE W REFLEX MICROSCOPIC
Bilirubin Urine: NEGATIVE
Leukocytes, UA: NEGATIVE
Nitrite: NEGATIVE
Protein, ur: NEGATIVE mg/dL
Urobilinogen, UA: 0.2 mg/dL (ref 0.0–1.0)

## 2010-08-15 LAB — POCT CARDIAC MARKERS
CKMB, poc: 1.5 ng/mL (ref 1.0–8.0)
Myoglobin, poc: 107 ng/mL (ref 12–200)
Troponin i, poc: <0.05 ng/mL (ref 0.00–0.09)
Troponin i, poc: <0.05 ng/mL (ref 0.00–0.09)

## 2010-08-15 LAB — PROTIME-INR
Prothrombin Time: 27.9 seconds — ABNORMAL HIGH (ref 11.6–15.2)
Prothrombin Time: 28.8 seconds — ABNORMAL HIGH (ref 11.6–15.2)

## 2010-08-15 LAB — CBC
HCT: 36.4 % (ref 36.0–46.0)
Hemoglobin: 12.6 g/dL (ref 12.0–15.0)
Hemoglobin: 13.6 g/dL (ref 12.0–15.0)
Hemoglobin: 16.6 g/dL — ABNORMAL HIGH (ref 12.0–15.0)
MCHC: 35.2 g/dL (ref 30.0–36.0)
RBC: 4.15 MIL/uL (ref 3.87–5.11)
RBC: 4.44 MIL/uL (ref 3.87–5.11)
RBC: 5.49 MIL/uL — ABNORMAL HIGH (ref 3.87–5.11)
RDW: 13.1 % (ref 11.5–15.5)
WBC: 17.8 10*3/uL — ABNORMAL HIGH (ref 4.0–10.5)

## 2010-08-15 LAB — BASIC METABOLIC PANEL
CO2: 22 mEq/L (ref 19–32)
Calcium: 8.6 mg/dL (ref 8.4–10.5)
Creatinine, Ser: 0.74 mg/dL (ref 0.4–1.2)
GFR calc Af Amer: >60 mL/min (ref >60)
GFR calc non Af Amer: >60 mL/min (ref >60)
Sodium: 135 mEq/L (ref 135–145)

## 2010-08-15 LAB — POCT I-STAT, CHEM 8
BUN: 12 mg/dL (ref 6–23)
Calcium, Ion: 1.11 mmol/L — ABNORMAL LOW (ref 1.12–1.32)
Glucose, Bld: 111 mg/dL — ABNORMAL HIGH (ref 70–99)
TCO2: 23 mmol/L (ref 0–100)

## 2010-08-15 LAB — CLOSTRIDIUM DIFFICILE EIA: C difficile Toxins A+B, EIA: NEGATIVE

## 2010-08-15 LAB — LIPASE, BLOOD: Lipase: 16 U/L (ref 11–59)

## 2010-08-15 LAB — POCT INR: INR: 4.2

## 2010-08-15 LAB — MAGNESIUM: Magnesium: 1.6 mg/dL (ref 1.5–2.5)

## 2010-08-15 NOTE — Patient Instructions (Signed)
Take 1/2 tablet today, then resume same dosage 1 tablet daily except 1.5 tablets on Mondays, Wednesdays, and Saturdays.  Recheck in 4 weeks.

## 2010-08-22 ENCOUNTER — Telehealth: Payer: Self-pay | Admitting: Cardiology

## 2010-08-23 MED ORDER — WARFARIN SODIUM 5 MG PO TABS: 5.0000 mg | ORAL_TABLET | ORAL | Status: DC

## 2010-08-23 NOTE — Telephone Encounter (Signed)
Rx refill sent to pharmacy, pt aware.

## 2010-09-12 ENCOUNTER — Ambulatory Visit (INDEPENDENT_AMBULATORY_CARE_PROVIDER_SITE_OTHER): Payer: Medicare PPO | Admitting: Emergency Medicine

## 2010-09-12 DIAGNOSIS — I4891 Unspecified atrial fibrillation: Secondary | ICD-10-CM

## 2010-09-12 DIAGNOSIS — G459 Transient cerebral ischemic attack, unspecified: Secondary | ICD-10-CM

## 2010-09-12 DIAGNOSIS — Z9889 Other specified postprocedural states: Secondary | ICD-10-CM

## 2010-09-25 NOTE — H&P (Signed)
[name] [name], STINES             ACCOUNT NO.:  [id number]   MEDICAL RECORD NO.:  [id number]          PATIENT TYPE:  INP   LOCATION:  2024                         FACILITY:  [facility]   PHYSICIAN:  Brayton El, MD    DATE OF BIRTH:  11-Nov-1940   DATE OF ADMISSION:  07/30/2007  DATE OF DISCHARGE:                              HISTORY & PHYSICAL   CARDIOLOGIST:  Everardo Beals. Juanda Chance, M.D., [facility]   CHIEF COMPLAINT:  Chest discomfort.   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  The  patient states that since her recent mapping and catheter ablation  procedure on July 14, 2007 she has been doing fairly well.  Today she  was working in the yard, exerting herself more than she has done in  quite some time and she began to experience substernal chest discomfort  associated with shortness of breath and diaphoresis that did not resolve  until she received nitroglycerin here in the emergency room.  Since then  she has not had any recurrence of this pain.  Her initial troponin in  the emergency room was completely within normal limits.  The patient  denies any edema, PND and orthopnea.  She states she had been in her  usual state of  health until she experienced this episode today.   PAST MEDICAL HISTORY:  The past medical history is as above in the HPI.   SOCIAL HISTORY:  The patient continues to smoke cigarettes.  She denies  any significant alcohol intake.   FAMILY HISTORY:  The family history is positive for premature coronary  disease.   REVIEW OF SYSTEMS:  The review of systems is as per the HPI.  All the  patient's  systems are reviewed and are negative.   PHYSICAL EXAMINATION:  VITAL SIGNS:  Temperature of 98.1, pulse is 76-  83, respirations are 18, and blood pressure is 125/63. She is sating at  98% on 2 liters,  GENERAL APPEARANCE:  In general she is in no acute distress.  HEENT:  Normocephalic and atraumatic.  Extraocular movements are intact.  NECK:  The neck is supple without JVD or carotid bruits.  HEART:  The heart has a regular rate and rhythm with a 2/6 systolic  murmur heard, loudest at the a the right upper sternal border, and a  prosthetic valve click auscultated in the left sternal border.  LUNGS:  The lungs are clear to auscultation bilaterally.  ABDOMEN:  The abdomen is soft, nontender and nondistended.  EXTREMITIES:  The extremities are without edema.  NEUROLOGIC:  The neuro exam is nonfocal.  PSYCHIATRIC:  The patient is appropriate with normal levels of insight.  SKIN:  The skin is warm and dry.   LABORATORY DATA:  The patient has a sodium of 131, potassium 3.9,  chloride 103, BUN 10, creatinine 0.8, and glucose 99.  Hemoglobin 13.9,  hematocrit 41.0.  Troponin less than 0.05.  CK/MB less than 1.  Myoglobin 75.  The EKG was independently reviewed by myself and  demonstrated normal sinus rhythm with nonspecific abnormalities in V1,  V2, 1, and aVL; however, there was some baseline artifact in 1 and aVL.  The nonspecific T wave abnormality in V2 is different when compared to  the EKG of July 15, 2007; however, this may be a function of lead  placement.  Cardiac catheterization report from September 06, 2006 was  reviewed.  It demonstrated the left main to be free of coronary disease,  the LAD to give rise to a diagonal branch and two septal perforators  that were free of significant disease.  The circumflex gave rise to a  first marginal branch, secondary marginal branch and two posterolateral  branches.  There was 40% narrowing in the first marginal branch and left  ventriculogram  at that time showed and an ejection fraction of 60%.   ASSESSMENT AND PLAN:  The patient's symptoms are concerning for unstable  angina. She did have an ablation two weeks ago; however, she had not had  chest pain up until today when she exerted herself.  We will admit the  patient to a telemetry unit and rule her out for an acute myocardial  infarction.  We will continue her on her home medications.  If the  decision is eventually made that she will need a repeat cardiac  catheterization her Coumadin would have to be held with starting her on  heparin so as to keep her anticoagulated as she will be at high risk for  thromboembolism considering her mechanical mitral valve and her history  of transient ischemic attacks.  However, for now we will continue her on  aspirin 81 mg and her Coumadin.  Although the patient did have a false  positive exercise Cardiolite prior to her last heart catheterization a  repeat exercise stress test to compare to compare to the old one may be  prudent.   The patient will be made nothing per os after midnight.      Brayton El, MD  Electronically Signed     SGA/MEDQ  D:  07/30/2007  T:  07/31/2007  Job:  [id number]

## 2010-09-25 NOTE — Assessment & Plan Note (Signed)
[facility]                            CARDIOLOGY OFFICE NOTE   NAME:[name], [name]                    MRN:          [id number]  DATE:08/20/2007                            DOB:          1941/05/10    PRIMARY CARE PHYSICIAN:  Dr. Lorie Phenix   CLINICAL HISTORY:  [name] is 70 years old and returned for a follow  up visit after a recent hospitalization for chest pain.  She was  hospitalized for chest pain and ruled out for a myocardial infarction  and underwent a Myoview scan which was negative for ischemia.  She had  had a previous catheterization in 2007 which showed nonobstructive  disease.   She also has a Bjork-Shiley valve placed for mitral regurgitation.  She  also has had recurrent atrial flutter following the first ablation and  had a second ablation in early March.   PAST MEDICAL HISTORY:  Significant for GERD.   Her current medications include aspirin, Coumadin, Prilosec, Prozac,  Simvastatin and Diltiazem.   EXAMINATION:  The blood pressure is 171/73, and the pulse 80 and  regular.  There was no venous distention.  Carotid pulses were full without  bruits.  The chest was clear.  The cardiac rhythm was regular.  There was a normal mitral prosthetic  closure sound and open sound.  The abdomen was soft with normal bowel sounds.  Peripheral pulses were full.  There was no peripheral edema.   IMPRESSION:  1. Recent hospitalization for chest pain, doubt ischemia with negative      Myoview scan.  2. Status post Bjork-Shiley mitral valve replacement.  3. Status post recurrent atrial flutter requiring second ablation      procedure.  4. History of transient ischemic attacks while on  Coumadin, and now      on both Coumadin and aspirin.  5. Nonobstructive disease at prior catheterization.   RECOMMENDATIONS:  I think [name] [name] is doing well.  We will plan to  continue her same medications, and we will see her back in follow up  in  one year.     Bruce Elvera Lennox Juanda Chance, MD, [facility]  Electronically Signed    BRB/MedQ  DD: 08/20/2007  DT: 08/20/2007  Job #: [id number]   cc:   Lorie Phenix

## 2010-09-25 NOTE — Discharge Summary (Signed)
NAMEALBIE, BAZIN             ACCOUNT NO.:  [id number]   MEDICAL RECORD NO.:  [id number]          PATIENT TYPE:  OIB   LOCATION:  2027                         FACILITY:  [facility]   PHYSICIAN:  Duke Salvia, MD, FACCDATE OF BIRTH:  06-05-1940   DATE OF ADMISSION:  07/14/2007  DATE OF DISCHARGE:  07/15/2007                               DISCHARGE SUMMARY   ALLERGIES:  This patient has an ALLERGY to CODEINE.   Dictation time greater than 35 minutes.   FINAL DIAGNOSES:  1. Recent admission July 06, 2007 to July 07, 2007 for      recurrent tachy-palpitations with chest tightness.      a.     Recurrent atrial tachycardia with conversion on intravenous       Cardizem.      b.     Discharged July 07, 2007 to return for electrophysiology       study, July 14, 2007.  2. Discharging day #1 status post electrophysiology      study/radiofrequency catheter ablation of a low right atrial      tachycardia, Dr. Sherryl Manges.   SECONDARY DIAGNOSES:  1. History of atrial tachycardia, status post ablation of a low right      atrial tachycardia, May, 2007.  2. Left heart catheterization September 05, 2005.  This study showed left      main and the left anterior descending had no significant disease.      The first obtuse marginal 40%, the proximal right coronary artery      40%.  There was normal left ventricular function.  Slight      hypokinesis of the anterolateral wall.  3. History of mitral regurgitation status post mitral valve      replacement, (mechanical Bjork-Shiley).      a.     Chronic Coumadin therapy.  4. Ongoing tobacco habituation.  5. Gastroesophageal reflux disease.  6. Status post cholecystectomy.  7. History of transient ischemic attack's.  8. Hypertension.   PROCEDURE:  July 14, 2007 electrophysiology study, radiofrequency  catheter ablation of a low right atrial tachycardia (this was a  recurrent atrial arrhythmia), Dr. Sherryl Manges.  The patient had no  post  procedural complications.  Discharging post procedure day #1.   HOSPITAL COURSE:  Ms. Toves is a 70 year old female who has a history  of tachy-palpitations.  She presented to the office of [facility] on July 06, 2007 with four days of palpitations associated with  chest tightness.  She did not relate any associated nausea, vomiting,  dyspnea, diaphoresis, presyncope or syncope.  The patient was  transferred to [facility] and was placed on IV Cardizem and  spontaneously converted.  Two things were planned for that  hospitalization - one was a 2-D echocardiogram and the second was a  radiofrequency catheter ablation of recurrent atrial arrhythmia.  Because of scheduling conflicts, the radiofrequency catheter ablation  was planned for July 13, 2007 and the echocardiogram was not done.   The patient presented electively July 14, 2007 for electrophysiology  study, radiofrequency catheter ablation.  As mentioned above,  the  arrhythmia was a low right atrial tachycardia.  This was successfully  ablated.  The patient has had no post procedural complications.  The  patient did hold her Coumadin on Monday, July 13, 2007.  She had an  appointment with the Coumadin Clinic at that time but due to a snow  cancellation, was unable to keep it.  The patient's INR is 1.9 on July 15, 2007 the day of discharge.  She is asked to take 7.5 mg both today  and tomorrow and to present to the [facility]in Clinic Friday, July 17, 2007  a 11:30.  In followup she will followup Dr. Juanda Chance, [facility]  on Thursday, August 20, 2007 at 2 o'clock for that echocardiogram, which  had not been done her last admission and at 3 o'clock to see Dr. Juanda Chance.   DISCHARGE MEDICATIONS:  The patient has a prescription for medications  of:  1. Cardizem 180 mg daily.  This was on hold prior to the procedure and      the patient will not need it going forward, but she is advised to      take it if she  has recurrence of tachy-palpitations.  2. Coumadin 7.5 mg Wednesday July 15, 2007 and Thursday July 16, 2007      and then to present to the Coumadin Clinic for Pro Time on Friday,      July 17, 2007.  Her home dose of Coumadin is 5 mg Tuesday, Thursday      and Saturday and 7.5 mg on Sunday, Monday, Wednesday and Friday.  3. Enteric coated aspirin 81 mg daily.  4. Temazepam 30 mg at bedtime daily.  5. Prilosec 20 mg daily.  6. Multivitamin daily.  7. Prozac 20 mg daily.  8. Vitamin B12 as before this admission.  9. Continue Excedrin as needed.   FOLLOWUP:  Once again, Coumadin Clinic on Friday, July 17, 2007 at  11:30.  Dr. Juanda Chance, Thursday, August 20, 2007 at 2 o'clock for  echocardiogram and 3 o'clock to see Dr. Juanda Chance.   LABORATORY DATA:  Labs for today included a Pro Time 21.9 and INR 1.9.      Maple Mirza, [place]      Duke Salvia, MD, [facility]  Electronically Signed    GM/MEDQ  D:  07/15/2007  T:  07/15/2007  Job:  [id number]

## 2010-09-25 NOTE — H&P (Signed)
NAMECOPPER, KIRTLEY             ACCOUNT NO.:  [id number]   MEDICAL RECORD NO.:  [id number]          PATIENT TYPE:  INP   LOCATION:  3703                         FACILITY:  [facility]   PHYSICIAN:  Madolyn Frieze. Jens Som, MD, FACCDATE OF BIRTH:  05/26/1940   DATE OF ADMISSION:  07/06/2007  DATE OF DISCHARGE:                              HISTORY & PHYSICAL   Mrs. 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  The patient is status post mitral  valve replacement with mechanical valve in 1980 secondary to mitral  regurgitation.  The patient underwent ablation of a low right atrial  tachycardia in May 2007 by Dr. Graciela Husbands.  Note:  The patient did have a  cardiac catheterization on September 05, 2005, after the admission for  atrial flutter as her troponins were mildly elevated.  The left main was  free of disease.  There was no significant disease in the LAD.  There  was 40% narrowing in the first marginal and 40% narrowing in the  proximal right coronary artery.  There was normal LV function with  slight hypokinesis of the anterolateral wall.  She has been treated  medically.  The patient states that she typically does not have  orthopnea, PND or pedal edema, although she does have chronic mild  dyspnea on exertion.  She does not have exertional chest pain.  Approximately 4 days ago she developed palpitations associated with  chest tightness.  This has persisted continuously since that time.  The  pain in her chest is not radiate nor is it pleuritic.  It is not related  to food.  There is no associated nausea, vomiting, shortness of breath  or diaphoresis.  She has not had presyncope or syncope.  She presented  to the office today because of the above symptoms.   CURRENT MEDICATIONS:  1. Aspirin 81 mg  daily.  2. Coumadin as directed.  3. Temazepam 30 mg p.o. nightly.  4. Prilosec 20 mg daily.  5. Vitamin B12.  6. Multivitamin.  7. Vitamin C.  8. Prozac 20 mg daily.  9. Excedrin.   ALLERGIES:  She has an allergy CODEINE.   SOCIAL HISTORY:  She does smoke, but she does not consume alcohol.   FAMILY HISTORY:  Positive for coronary artery disease.   PAST MEDICAL HISTORY:  Significant for:  1. Mitral valve replacement as described in the HPI.  2. She is also had apparently TIAs on Coumadin, and low-dose aspirin      has been added due to that.  3. She also has a history of atrial flutter ablation.  4. Gastroesophageal reflux disease.  5. She had a prior cholecystectomy as well.   REVIEW OF SYSTEMS:  She denies any headaches, fevers, or chills.  There  is no productive cough or hemoptysis.  There is no dysphagia,  odynophagia, melena, hematochezia.  There is no dysuria, hematuria.  There is no rash or seizure  activity.  There is no orthopnea, PND or  pedal edema.  Remaining systems are negative.   PHYSICAL EXAMINATION:  VITAL SIGNS:  Blood pressure of 153/95 and pulse  148.  She weighs 146 pounds.  GENERAL:  She is well-developed, well-nourished and does not appear to  be in any acute distress.  Skin is warm and dry.  She does not appear to  be depressed.  There is no peripheral clubbing.  Her back is normal.  HEENT:  Normal with normal eyelids.  NECK:  Supple with a normal upstroke bilaterally.  No bruits heard.  There is no jugular distention and no thyromegaly noted.  CHEST:  Clear to auscultation.  CARDIOVASCULAR:  Exam reveals a tachycardic rate, but a regular rhythm  is noted.  There is a crisp mechanical valve sound with no murmurs.  There is no rub noted.  ABDOMEN:  Nontender, nondistended.  Positive bowel sounds.  No  hepatosplenomegaly.  No mass appreciated.  There is no abdominal bruit.  She is status post cholecystectomy.  She also has a previous sternotomy.   EXTREMITIES:  She is 2+ femoral pulses bilaterally, no bruits.  Extremities show no edema.  No cords.  She is 2+ dorsalis pedis pulses  bilaterally.  NEUROLOGIC:  Exam is grossly intact.   Her electrocardiogram shows atrial flutter at rate of 147.  There are no  significant ST changes.   Note:  We did give adenosine in the office (6 mg) IV.  This clearly  demonstrated atrial flutter, but the rate of the flutter was very slow.   DIAGNOSES:  1. Recurrent atrial flutter versus ectopic atrial tachycardia.  Mrs.      Stanczak had developed recurrent atrial flutter, and her ventricular      response is 150.  It has been present this way for 4 days.  We will      admit to the hospital and cycle enzymes.  Note:  Her troponin was      elevated previously when she was in atrial flutter.  We will also      add IV Cardizem for rate control.  I will check an echocardiogram      as well as TSH.  We will need to check her INRs.  If they been      therapeutic recently, then we will plan to proceed with      cardioversion tomorrow or if the schedule will allow, we will ask      the electrophysiologist to proceed with ablation.  If her INR has      recently been less than 2, then she will need a TEE prior to either      of these.  We will also check baseline laboratories.  2. Hypertension.  Her blood pressure is elevated today, but we are      adding Cardizem, and this should help with this.  3. Status post mitral valve replacement.  She will continue on her      Coumadin as well as her baby aspirin.  We are repeated      echocardiogram for atrial flutter, and this should help reassess      her mitral valve as well.  4. Gastroesophageal reflux disease.  5. History of tobacco use.  We discussed the importance of      discontinuing this.      Madolyn Frieze Jens Som, MD, [facility]  Electronically Signed     BSC/MEDQ  D:  07/06/2007  T:  07/06/2007  Job:  [id number]

## 2010-09-25 NOTE — Discharge Summary (Signed)
NAMEJIANNI, SHELDEN             ACCOUNT NO.:  [id number]   MEDICAL RECORD NO.:  [id number]          PATIENT TYPE:  INP   LOCATION:  3703                         FACILITY:  [facility]   PHYSICIAN:  Maple Mirza, PA   DATE OF BIRTH:  [birth year]   DATE OF ADMISSION:  07/06/2007  DATE OF DISCHARGE:  07/07/2007                               DISCHARGE SUMMARY   TIME OF THIS DICTATION:  Greater than 35 minutes since it involves  setting up a return appointment to [facility]Stay [facility].   FINAL DIAGNOSES:  1. Admitted with 4 days of persistent palpitation/chest tightness (no      dyspnea on exertion, no orthopnea, no pre/syncope)  2. Atrial flutter, typical.  Adenosine given at [facility] office      demonstrating atrial flutter with slow rate.   SECONDARY DIAGNOSES:  1. History of electrophysiology study May 2007.  Arrhythmia was not      atrial flutter but atrial tachycardia from the low septal right      atrium.  2. History of mitral valve replacement with mechanical mitral valve      1980.  3. History of TIAs.  4. Chronic Coumadin.  5. GERD.  6. Hypertension.  7. Ongoing tobacco habituation having been counseled this admission      and expressing that she does not want to quit.  8. Status post cholecystectomy.   PROCEDURES THIS ADMISSION:  Echocardiogram was planned, but this has not  been done at the time of this discharge.  Perhaps we can get to it at  the next admission.   BRIEF HISTORY:  Ms. Konicki is a 70 year old female.  She has a history  of mitral valve replacement for mitral regurgitation.  This was done in  1980.  She has a history of transient ischemic attack, atrial  tachycardia ablation which was done in May 2007.   The patient did have a cardiac catheterization September 05, 2005, this was  the same admission for her atrial tachycardia ablation.  Her troponins  were mildly elevated.  This study showed that the left main was free of  disease.  There was  no significant disease in the LAD.  There was a 40%  narrowing in the first obtuse marginal and a 40% narrowing in the  proximal right coronary artery left ventricular function was normal with  slight hypokinesis of the anterolateral wall.  She has been treated  since medically.   The patient states she does not have orthopnea, paroxysmal nocturnal  dyspnea or pedal edema, although she does have chronic mild dyspnea on  exertion.  She does not have exertional chest pain but approximately 4  days ago she developed palpitations associated with chest tightness.  This has persisted continuously since that time.  The pain in her chest  does not radiate and it is not pleuritic.  It is not related to intake  of food.  There is no associated nausea, vomiting, shortness of breath  or diaphoresis.  She is has not had presyncope or syncope.  She  presented to the office today with her tachy palpitations  and chest  tightness.   HOSPITAL COURSE:  The patient was seen in the office by Dr. Olga Millers with tachy palpitations lasting 4 days and persistent.  The  patient was given adenosine in the office which showed pronounced and  marked atrial flutter waves with slow rates.  The patient was  transferred from the office to [facility] and started on IV  Cardizem bolus and drip.   HOSPITAL COURSE:  The patient was transferred from the office with a  diagnosis of atrial flutter which by electrocardiogram looks typical.  She was started on IV Cardizem bolus and drip and about 3 o'clock in the  morning on February 24 converted spontaneously on this medication back  to sinus rhythm.  Her troponin I studies have been cycled, and they are  0.03 and 0.03.  Her TSH is 1.016, creatinine this admission is 0.73,  hemoglobin 15.  Her INR is therapeutic.  Prior to knowledge of her  conversion to sinus rhythm, the patient was scheduled for  transesophageal echocardiogram and cardioversion.  This was  cancelled  when the patient declared sinus rhythm.  It was thought that perhaps in  the afternoon of February 24, the patient could be taken to the  electrophysiology lab for electrophysiology study radiofrequency  catheter ablation.  However, scheduling conflicts have not allowed this  to be done.  The patient will then discharge in sinus rhythm February  24.  She will be maintained on Cardizem and she will be brought back on  March 3 at 7 o'clock in the morning to Short-Stay C to have a  radiofrequency catheter ablation after electrophysiology study.  Prior  to discharge on February 24, she will be given Cardizem 180 mg.  This is  a medication she will take for the rest of the week and over the  weekend.   MEDICATIONS AT DISCHARGE:  1. Cardizem 180 mg daily.  2. Coumadin 7.5 mg Sunday, Monday, Wednesday, Friday; 5 mg Tuesday,      Thursday, Saturday.  This is her home dose.  3. Enteric-coated aspirin 81 mg daily.  4. Temazepam 30 mg at bedtime daily.  5. Prilosec 20 mg daily.  6. Multivitamin daily.  7. Prozac 20 mg daily.  8. Continue vitamin B12, vitamin BC as before this admission.  9. Continue Excedrin as needed.   She returns to [facility] Short-Stay C Tuesday, March 3 at 7  o'clock.  She is asked to eat nothing after midnight, Monday, March 2.  She is not to take Cardizem on the morning of Monday March 2 or in the  morning of March 3.      Maple Mirza, PA     GM/MEDQ  D:  07/07/2007  T:  07/08/2007  Job:  [id number]   cc:   Everardo Beals. Juanda Chance, MD, [facility]  Madolyn Frieze. Jens Som, MD, [facility]  Duke Salvia, MD, [facility]  Lorie Phenix

## 2010-09-25 NOTE — Assessment & Plan Note (Signed)
[facility]                            CARDIOLOGY OFFICE NOTE   NAME:Pantaleon, JORJA EMPIE                    MRN:          [id number]  DATE:03/18/2007                            DOB:          1940/11/17    PRIMARY CARE PHYSICIAN:  Dr. Lorie Phenix.   CLINICAL HISTORY:  Ms. Rosenkranz is 70 years old and had a Bjork-Shiley  mitral valve replacement for mitral regurgitation in 1980.  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  We have targeted her INRs between 3 and  4.   She also has had an atrial flutter ablation.  She had a positive  troponin at the time of her atrial flutter, and subsequently underwent  catheterization and was found to have only very mild nonobstructive  coronary disease.  Dr. Graciela Husbands performed atrial flutter ablation.   She has been doing well recently from her heart with no recent chest  pain or shortness of breath.  She occasionally has some short-lived  palpitations.   Her chief complaint is related to some memory disturbances which has  affected her at work.   PAST MEDICAL HISTORY:  Significant for gastroesophageal reflux disease.   CURRENT MEDICATIONS:  1. Aspirin.  2. Coumadin.  3. Temazepam.  4. Prilosec.  5. Vitamin B12.  6. Prozac.   PHYSICAL EXAMINATION:  The blood pressure is 175/90 and the pulse 83 and  regular.  There was no venous distension.  The carotid pulses were full without  bruits.  CHEST:  Clear.  Cardiac rhythm was regular.  There was a normal prosthetic closure sound  and a normal prosthetic opening sound.  There is a short systolic murmur  at the left sternal edge.  The abdomen was soft with normal bowel sounds.  There was no  hepatosplenomegaly.  Peripheral pulses were full and there was no peripheral edema.   An ECG was normal.   IMPRESSION:  1. Valvular heart disease, status post Bjork-Shiley mitral valve  replacement for mitral regurgitation, 1980, now stable.  2. History of TIAs while on Coumadin, now on Coumadin and aspirin.  3. Nonobstructive disease at catheterization.  4. Status post atrial flutter ablation.  5. Good left ventricular function.  6. Memory difficulties.   RECOMMENDATIONS:  I think Ms. Gleed is doing quite well from the  standpoint of her heart.  Her biggest problem is memory.  We will plan  to get some laboratory studies today, including a BNP, CBC, T4, B12  level, and liver function tests.  I have recommended that she see Dr.  Elease Hashimoto for further evaluation regarding her memory difficulties and  consideration for treatment.  I will plan to see Ms. Preuss back in 1  year.     Bruce Elvera Lennox Juanda Chance, MD, [facility]  Electronically Signed    BRB/MedQ  DD: 03/18/2007  DT: 03/19/2007  Job #: [id number]

## 2010-09-25 NOTE — Assessment & Plan Note (Signed)
[facility]                               LIPID CLINIC NOTE   NAME:Velador, LEILANEE RIGHETTI                    MRN:          [id number]  DATE:07/17/2007                            DOB:          [birth year]    [name] is seen back in the Coumadin Clinic today for follow-up  after her recent hospital admission on July 14, 2007 status post  radiofrequency catheter ablation of a low right atrial tachycardia by  Dr. Graciela Husbands. The patient is seen in Coumadin Clinic today for normal  follow-up.  She states that she has been feeling mini palpitations and  is extremely short of breath.  The patient's weights have remained  normal.  She is having no chest pain or chest discomfort.  The patient's  case was discussed with Dr. Sharrell Ku as Dr. Graciela Husbands was not in the  office today. An EKG was obtained to determine if the patient was in  normal rhythm or not. EKG evaluated by Dr. Ladona Ridgel revealed normal sinus  rhythm and normal EKG with ventricular rate of 95 beats per minute and a  QRS duration of 90 milliseconds as a result and the patient's symptoms  were resolving. The patient was asked to continue on her medications as  directed at the time of discharge and to followup with Dr. Graciela Husbands as  indicated. The patient will call with questions or problems in the  meantime.      Shelby Dubin, PharmD, BCPS, CPP  Electronically Signed      Doylene Canning. Ladona Ridgel, MD  Electronically Signed   MP/MedQ  DD: 08/03/2007  DT: 08/03/2007  Job #: [id number]

## 2010-09-25 NOTE — Discharge Summary (Signed)
NAMEFEY, COGHILL             ACCOUNT NO.:  [id number]   MEDICAL RECORD NO.:  [id number]          PATIENT TYPE:  INP   LOCATION:  2024                         FACILITY:  [facility]   PHYSICIAN:  Bruce R. Juanda Chance, MD, FACCDATE OF BIRTH:  14-Oct-1940   DATE OF ADMISSION:  07/30/2007  DATE OF DISCHARGE:  07/31/2007                               DISCHARGE SUMMARY   PRIMARY CARDIOLOGIST:  Everardo Beals. Juanda Chance, MD, [facility]   PRIMARY CARE Joslynne Klatt:  Dr. Lorie Phenix.   DISCHARGE DIAGNOSIS:  Unstable angina.   SECONDARY DIAGNOSES:  1. History of nonobstructive coronary artery disease by      catheterization, April 2007.  2. History of atrial fibrillation on chronic Coumadin.  3. Depression.  4. History of atrial flutter, status post radiofrequency ablation.  5. History of mitral regurgitation status post mechanical mitral valve      replacement in 1980 with chronic Coumadin.  6. Gastroesophageal reflux disease.  7. History of transient ischemic attack.  8. Hypertension.  9. Anxiety.  10.Ongoing tobacco abuse.  11.Hyperlipidemia.   ALLERGIES:  CODEINE.   PROCEDURES:  None.   HISTORY OF PRESENT ILLNESS:  A 70 year old female with the above problem  list who was in her usual state of health until July 30, 2007, when she  was out doing some yard work and had sudden onset of extreme weakness  with associated nausea, pallor, and diaphoresis, followed by substernal  chest heaviness and dyspnea.  She laid down on the bed, called 911, and  upon their upon arrival, was treated with oxygen with resolution of  chest heaviness.  She presented to [facility] ED, where ECG showed no  acute changes.  She was admitted for further evaluation.   HOSPITAL COURSE:  The patient ruled out for MI.  She has not had any  recurrent chest discomfort, and we will plan to discharge her home today  in good condition.  We have arranged for an Adenosine Myoview on August 05, 2007, at 11:30 a.m. in our office with  followup with Dr. Juanda Chance on  August 20, 2007.   DISCHARGE LABORATORY DATA:  Hemoglobin 13.9, hematocrit 40.6, WBC 7.8,  platelet 264, MCV 85.3.  sodium 135, potassium 4.6, chloride 103, CO2  28, BUN 6, creatinine 0.65, glucose 98, INR 2.6, total bilirubin 0.9,  alkaline phosphatase 120, AST 21, ALT 20, and albumin 3.5.  Cardiac  markers negative x2.  Total cholesterol 306, triglycerides 75, HDL 42,  LDL 249, calcium 9.2, and TSH is pending.  BNP 117.0.   DISPOSITION:  The patient is being discharged home today in good  condition.  Follow up on her appointments.  As above, we have arranged  for followup Adenosine Myoview August 05, 2007, at 11:30 a.m. in our  office.  She will follow up with the 2D echocardiogram on August 20, 2007  at 2 p.m. and then with Dr. Juanda Chance on August 20, 2007, at 3 p.m.   DISCHARGE MEDICATIONS:  1. Aspirin 81 mg daily.  2. Cardizem CD 180 mg daily.  3. Temazepam 30 mg nightly.  4. Prilosec 20 mg daily.  5. Vitamin B12 daily.  6. Multivitamin 1 daily.  7. Vitamin C daily.  8. Prozac 20 mg daily.  9. Coumadin as previously prescribed.  10.Claritin as previously taken.  11.Simvastatin 40 mg nightly.   The patient has been counseled on the importance of smoking cessation.   OUTSTANDING LAB STUDIES:  TSH is pending.   DURATION OF DISCHARGE ENCOUNTER:  Forty minutes including physician  time.      Nicolasa Ducking, ANP      Bruce R. Juanda Chance, MD, [facility]  Electronically Signed    CB/MEDQ  D:  07/31/2007  T:  08/01/2007  Job:  [id number]   cc:   Lorie Phenix

## 2010-09-25 NOTE — Op Note (Signed)
NAMEJOELEE, SNOKE             ACCOUNT NO.:  [id number]   MEDICAL RECORD NO.:  [id number]          PATIENT TYPE:  OIB   LOCATION:  2027                         FACILITY:  [facility]   PHYSICIAN:  Duke Salvia, MD, FACCDATE OF BIRTH:  19-Jan-1941   DATE OF PROCEDURE:  07/14/2007  DATE OF DISCHARGE:                               OPERATIVE REPORT   PREOPERATIVE DIAGNOSIS:  Supraventricular tachycardia.   POSTOPERATIVE DIAGNOSIS:  Peri-coronary sinus atrial tachycardia.   PROCEDURE:  Invasive electrophysiological study, isoproterenol infusion,  arrhythmia mapping, and catheter ablation.   Following obtaining informed consent, the patient was brought to the  electrophysiology laboratory and placed on the fluoroscopic table in the  supine position.  After routine prep and drape, cardiac catheterization  was performed under local anesthesia and conscious sedation.  Noninvasive blood pressure monitoring, transcutaneous oxygen saturation  monitoring, and end-tidal CO2 monitoring were performed continuously  throughout the procedure.  Following the procedure, the catheter was  removed.  The sheaths were left in place, and the patient was  transferred to the holding area in stable condition for sheath removal.   CATHETERS:  1. A 5-French quadripolar catheter was inserted via the femoral vein      to the AV junction to measure the His electrogram.  2. A 5-French quadripolar catheter was inserted via the femoral vein      to the right ventricular apex and subsequently changed out for a      hexapolar catheter.  3. A 7-French dual decapolar catheter was inserted via the femoral      vein to the tricuspid annulus and the right atrium.  4. A 6-French octapolar catheter was inserted via the right femoral      vein to the coronary sinus.  5. An 8-French 5 mm deflectable-tip ablation catheter was inserted via      an SR-0 sheath to mapping sites in the posterior septal space.   Surface I, aVF,  and V1 were monitored continuously throughout the  procedure.  Following insertion of the catheters, the stimulation  protocol included incremental atrial pacing, incremental ventricular  pacing, single and double atrial extra stimuli paced cycle length of  600, 500k, and 400 msec, burst atrial pacing from 400-300 msec in the  absence and the presence of isoproterenol.   RESULTS:   SURFACE ELECTROCARDIOGRAM:  Rhythm:  Sinus; RR interval 684 msec; PR  interval:  185 msec; QRS duration 107 msec; QT interval 454 msec; P-wave  duration 159 msec; AH interval 102 msec; HV interval 45 msec.  Final:  Sinus:  RR interval 625 msec; PR interval 128 msec; QRS duration  105 msec; QT interval 356 msec; P-wave duration N/M;  AH interval 58  msec; HV interval:  49 msec.   AV Wenckebach was less than 400 msec.   VA conduction was dissociated at 600 msec.   The AV node had an effective refractory period.  At a paced cycle length  of 500 msec, it was 280 msec, and antegrade conduction was continuous.   Accessory pathway function:  No evidence of an accessory pathway was  identified.  ARRHYTHMIAS INDUCED:  During catheter manipulation, the patient wanted  supraventricular tachycardia which mimicked the patient's clinical  tachycardia.  It was noted to be 1:1, and it was noted that we could  dissociate A from V with ventricular pacing.  We also knew that  previously carotid sinus massage and resulted in the induction of  complete heart block, with preservation of tachycardia, all consistent  with an atrial tachycardia.   Mapping of the tachycardia demonstrated a site near the coronary sinus  os that was -22 to -30 seconds prior the onset of surface P.  Left  atrial conduction was proximal to lateral, and right atrial conduction  was mapped with a halo catheter and continuously had earliest right  atrial activation at the os of the coronary sinus.   Radiofrequency energy:  A radiofrequency  catheter was used to map the  site and the site where we were -25 msec prior the onset of surface P.  RF energy resulted in of the termination of the tachycardia following  prolongation of the PP interval a little bit different from what I  expected.  We then placed a rosette around this area.   Following this, induction with burst pacing and programmed stimulation  failed to reinduce tachycardia in the absence or in the presence of  isoproterenol.   A total of 4 minutes and 29 seconds of RF energy was applied in the  aforementioned applications.   FLUOROSCOPY TIME:  A total of 5 minutes and 25 seconds of fluoroscopy  time was utilized at 7-1/2 frames per second.   IMPRESSION:  1. Normal sinus function.  2. Abnormal atrial function, manifested by sustained atrial      tachycardia originating from the coronary sinus area.  3. Normal AV nodal function.  4. Normal His-Purkinje system function.  5. No accessory pathway.   In summary, the results of electrophysiological testing resulted in the  reinduction of the patient's clinical arrhythmia, which was then  demonstrated to be an atrial tachycardia.  Catheter ablation using  electrogram mapping, unipolar mapping successfully identify the source  of the tachycardia and eliminated it with RF energy.  The patient was  not inducible at the end of the study in the presence and the absence of  isoproterenol.   The patient will be observed overnight.  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      Duke Salvia, MD, [facility]  Electronically Signed     SCK/MEDQ  D:  07/14/2007  T:  07/15/2007  Job:  [id number]   cc:   Electrophysiology Laboratory

## 2010-09-25 NOTE — Letter (Signed)
August 20, 2007     RE:  CATALEYAH, COLBORN  MRN:  [id number]  /  DOB:  13-Apr-1941   To whom it may concern:   Mrs. [name] [name] was requested to serve on jury duty.  She has been  recently hospitalized for cardiac arrhythmia and chest pain.  She has  undergone a radiofrequency ablation procedure for her arrhythmia and she  had and is being evaluated with a scan for chest pain.  Because of her  medical condition, I think it is in her best interest from a medical  standpoint not to serve on jury duty for the next 2 months from today  which is August 20, 2007.  I would be glad to answer questions regarding  her medical condition.    Sincerely,      Bruce R. Juanda Chance, MD, [facility]  Electronically Signed    BRB/MedQ  DD: 08/20/2007  DT: 08/20/2007  Job #: [id number]   CC:    Beverely Low, Ms.

## 2010-09-28 NOTE — Discharge Summary (Signed)
NAME:  [name] [name], [name] [name]                       ACCOUNT NO.:  [id number]   MEDICAL RECORD NO.:  [id number]                   PATIENT TYPE:  INP   LOCATION:  5743                                 FACILITY:  [facility]   PHYSICIAN:  Candyce Churn, M.D.          DATE OF BIRTH:  1940/06/23   DATE OF ADMISSION:  04/29/2003  DATE OF DISCHARGE:  05/02/2003                                 DISCHARGE SUMMARY   DISCHARGE DIAGNOSES:  1. Acute gastroenteritis, resolved.  2. History of depression.  3. History of cerebrovascular accident.  4. Upper respiratory tract infection, likely viral, improved.   DISCHARGE MEDICATIONS:  1. Prozac 20 mg daily.  2. Plavix 75 mg daily.  3. Prilosec 20 mg daily.  4. Claritin 10 mg daily.  5. Coumadin 7.5 mg Tuesday and Thursday, and 5 mg on the other days of the     week.  6. Cipro 500 mg p.o. b.i.d. for three days.   FOLLOWUP:  She is to follow up with her primary care physician, Dr. Lorie [name], in three to four days to check PT and INR.   HOSPITAL COURSE:  [name] [name] is a 70 year old female with a history  of depression, stroke, allergies, and apparent GERD.  She had been  complaining of intermittent abdominal pain after eating a cream pie several  hours prior to that.  It was associated with nausea and vomiting.  She had  recently had a respiratory infection, which she received antibiotic  therapy for in the form of Zithromax.  On admission she was complaining of  sore muscles and dizziness and also loose stools.  No melena or bright red  blood per rectum.   On evaluation in the [facility] Emergency Room on April 14, 2003, she was  found to have a low-grade temperature of 99.1, she had tenderness in the  left upper quadrant without rebound.  Her white count was 22.5, hemoglobin  14.9, platelet count 279,000.  CT scan of the abdomen and pelvis reveals  gastric distention, but was otherwise benign.  There was some fluid in the   colon.   She was admitted and treated with intravenous fluids and continued on her  usual medications except for Coumadin.  She was also treated with  intravenous Flagyl and ciprofloxacin.   She actually improved a great deal the initial 24 hours after admission.  She was seen in consultation by Dr. Herbert Moors who felt that continued IV  hydration was important, and he did feel that it was also acute  gastroenteritis and that she was clinically improving.   Stools for white cells were negative on April 30, 2003.  Stool for C.  diff toxin was negative on April 30, 2003.  Urinalysis on April 30, 2003, did reveal positive nitrites, 3 to 6 white cells, and many bacteria.  Leukocyte esterase was negative.  This could have been simply a contaminated  urine.  On admission, the prothrombin time was elevated at 28.2, and INR was 4.1,  and by May 02, 2003, the day of discharge, her INR was down to 2.8, and  prothrombin time was 23.   White count on admission was 22,500, and on April 30, 2003, was 15,900,  on May 01, 2003, was 7000.  Hemoglobin was 14.9 upon admission and 12.6  on April 30, 2003.  White cell differential included 93% polys on  admission to the ER.   The patient was able to tolerate a soft diet on May 01, 2003, and by  May 02, 2003, she was ambulatory, taking her diet very well, was  without pain or complaint.   She was discharged home on May 02, 2003, on the above medications in  markedly improved condition.   She is to follow up with Dr. Lorie [name] as per above.                                                Candyce Churn, M.D.    RNG/MEDQ  D:  05/21/2003  T:  05/22/2003  Job:  [id number]   cc:   [name] [name]  [address]  [place]  [place] [address]  Fax: [phone]

## 2010-09-28 NOTE — H&P (Signed)
[name] [name], [name] [name]             ACCOUNT NO.:  [id number]   MEDICAL RECORD NO.:  [id number]          PATIENT TYPE:  OIB   LOCATION:  NA                           FACILITY:  [facility]   PHYSICIAN:  Ok Anis, NPDATE OF BIRTH:  11-Apr-1941   DATE OF ADMISSION:  09/05/2005  DATE OF DISCHARGE:                                HISTORY & PHYSICAL   PRIMARY CARE PHYSICIAN:  Dr. Lorie Phenix.   PRIMARY CARDIOLOGIST:  Dr. Charlies Constable.   PATIENT PROFILE:  A 70 year old, married, white female with a prior history  of mitral valve replacement in 1980 who presented today for cardiac  catheterization secondary to a recent episodes of a fluttering chest pain  with follow-up abnormal functional study.   PROBLEM LIST:  1.  Chest pain.      1.  On August 29, 2005 exercise Myoview, exercise time 6 minutes.          Positive chest pain with exercise.  EF 72% with perfusion imaging          showing a defect in the mid/distal anterior wall with only slight          reversibility without wall motion abnormality.  2.  Atrial flutter with rapid ventricular response.      1.  Hospitalized August 23, 2005 status post cardioversion on August 25, 2005.  3.  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  4.  Chronic anticoagulation.  5.  Ongoing tobacco abuse 30 pack year history, currently one pack a day.  6.  Anxiety.  7.  History of transient ischemic attack.  8.  Gastroesophageal reflux disease.  9.  Seasonal allergies.   ALLERGIES:  CODEINE.   HISTORY OF PRESENT ILLNESS:  A 70 year old white female with a prior history  of tobacco abuse and MVR as well as A. flutter who was admitted in August 23, 2005 with anterior chest heaviness and was found to be in atrial flutter.  She underwent cardioversion on April 15 and was discharged home on diltiazem  and Coumadin. She was also set up for an outpatient functional study  secondary to the chest  pain that she had on admission and this was performed  revealing mid/distal anterior defect which was felt to be too significant to  be breast attenuation.  She also has been reporting exertional chest  heaviness as well as dyspnea on exertion and decreased exercise tolerance.  She presents for cardiac catheterization today.   HOME MEDICATIONS:  1.  Coumadin as directed.  2.  Prozac 20 mg q.d.  3.  Prilosec 20 mg q.d.  4.  Claritin 10 mg q.d.  5.  Vitamin B12 1000 mcg q.d.  6.  Multivitamin 1 q.d.  7.  Restoril 30 mg q.h.s.  8.  Diltiazem ER 240 mg q.d.  9.  Nicotine patch not currently using.   FAMILY HISTORY:  Father died of an MI at age 65.  Mother died around age 50  also of an  MI.   SOCIAL HISTORY:  She lives in [place] with her husband.  She is a  Conservation officer, nature at [employer]. She is currently smoking a pack a day which  she is down from about a pack and a half and she has been smoking for 30  years. She does drink a lot of caffeine but denies any alcohol or drugs and  does not routinely exercise.   REVIEW OF SYSTEMS:  Positive for exertional chest heaviness and dyspnea on  exertion as well as GERD and depression. All other systems reviewed and  negative.   PHYSICAL EXAMINATION:  VITAL SIGNS:  Temperature 97.5, heart rate 80,  respirations 18, blood pressure 151/64, pulse ox 96% on room air.  She is 66  inches tall and weighs 150 pounds.  GENERAL:  Pleasant, white female in no acute distress.  Awake, alert,  oriented x3.  NECK:  No carotid upstrokes, no bruits or JVD.  LUNGS:  Respirations regular and unlabored, clear to auscultation.  CARDIAC:  Regular S1, S2, no S3 or S4. There is a 2/6 systolic murmur in the  __________ upper sternal border.  ABDOMEN:  Round, soft, nontender, nondistended. Bowel sounds present x4.  EXTREMITIES:  Warm, dry, pink, no clubbing, cyanosis or edema. Dorsalis  pedis and posterior tibial pulses 2+ and equal bilaterally. There are no   femoral bruits.   ACCESSORY FOCAL FINDINGS:  All labs and chest x-ray are pending.   ASSESSMENT/PLAN:  1.  Unstable angina. The patient continues to have exertional chest      heaviness with decrease in exercise tolerance.  She has an abnormal      functional study and scheduled for cardiac catheterization today with      Dr. Juanda Chance.  She remains on aspirin and beta blocker therapy.  Will add      statin if coronary artery disease present.  2.  History of MVR. She has come off Coumadin and has been bridged with      Lovenox. She will need to be bridged following catheterization as well.  3.  Atrial flutter.  She is currently in sinus rhythm.  She remains on      diltiazem and Coumadin.  4.  Tobacco abuse.  Smoking cessation strongly advised.  The patient      underwent smoking cessation counseling during her last admission but      continues to smoke a pack a day.  5.  Gastroesophageal reflux disease. No complaints. Continue proton pump      inhibitor.  6.  Depression.  Continue Prozac.  7.  Seasonal allergies stable, continue Claritin.      Ok Anis, NP     CRB/MEDQ  D:  09/05/2005  T:  09/05/2005  Job:  [id number]

## 2010-09-28 NOTE — Op Note (Signed)
NAMELEONARD, HENDLER             ACCOUNT NO.:  [id number]   MEDICAL RECORD NO.:  [id number]          PATIENT TYPE:  OIB   LOCATION:  3702                         FACILITY:  [facility]   PHYSICIAN:  Duke Salvia, M.D.  DATE OF BIRTH:  09/13/1940   DATE OF PROCEDURE:  10/10/2005  DATE OF DISCHARGE:                                 OPERATIVE REPORT   PREOPERATIVE DIAGNOSIS:  Atrial flutter.   POSTOPERATIVE DIAGNOSIS:  Low right atrial tachycardia.   OPERATION PERFORMED:  Invasive electrophysiologic study, arrhythmia mapping,  radio frequency catheter ablation.   DESCRIPTION OF PROCEDURE:  Following the obtaining of informed consent, the  patient was brought to the electrophysiology laboratory and placed on the  fluoroscopic table in supine position.  After routine prep and drape,  cardiac catheterization was performed with local anesthesia and conscious  sedation.  Noninvasive blood pressure monitoring, transcutaneous oxygen  saturation monitoring and end tidal CO2 monitoring were performed continuous  throughout the procedure.  Following the procedure, the catheters were  removed.  Hemostasis was obtained.  The patient was transferred to the  holding area in stable condition.   Please note that the patient's INR at the beginning of the study was 1.9  with a Bjork-Shiley valve.  It had been anticipated that it would be above  2.  Because of this, heparin without a bolus was initiated at the onset of  the procedure.   CATHETERS:  A 5 French quadripolar catheter was inserted via the left  femoral vein to the AV junction, subsequently changed out for a 6 French  hexapolar catheter to be used as a unipolar marker.  A 6 French octapolar catheter was inserted via the right femoral vein to the  coronary sinus and subsequently switched out for an 8 French sheath through  which the ablation catheter was passed.  A 7 French duodecapolar catheter was inserted via the left femoral vein to  the tricuspid annulus.   Surface leads 1, aVF and V1 were monitored continuously throughout the  procedure.  Following insertion of the catheters, the stimulation protocol  included incremental atrial pacing, incremental ventricular pacing, single  and double atrial extrastimuli at pace cycle lengths of 600 and 400 msec;  entrainment mapping from the tricuspid annulus.   RESULTS:  Surface electrocardiogram basic intervals.  Initial:  Rhythm R-R interval 856 msec, P-R interval was 150 msec, P-wave duration was  137 msec, QRS duration 106 msec, QT interval 415 msec, bundle branch block  absent, pre-excitation absent, A-H interval 99 msec, H-V interval 56 msec.  Final:  Rhythm sinus.  R-R interval 915 msec, P-R interval 193 msec, QRS duration  117 msec, Q-T interval 460 msec, P-wave duration 131 msec, bundle branch  block absent, pre-excitation absent, A0-H interval 143 msec, H-V interval 53  msec.   AV NODAL FUNCTION:  AV Wenckebach was 380 msec.  VA Wenckebach was 400 msec.  AV nodal effective refractory period at pace cycle length of 600 msec was  270 msec and A nodal conduction was continuous antegrade and retrograde.   ACCESSORY PATHWAY FUNCTION:  No evidence of an  accessory pathway was  identified.   ARRHYTHMIAS INDUCED:  The patient's clinical atrial arrhythmia was induced  with programmed stimulation with coronary sinus pacing at 400:300:250.  It  was sustained.  The atrial cycle length was 346 msec, reflecting the  patient's clinical arrhythmia.   Entrainment mapping was undertaken from the tricuspid annular region as the  tachycardia seemed to emanate from the posterior septal area in the region  just inferior posterior to the coronary sinus os.  Earliest activation was  in the right atrium and the earliest atrial activation  in the right atrium  was about 15 to 20 msec to onset of surface P.  In the area of earliest  atrial activation, the post pacing interval was  approximately 360 msec,  quite close to the tachycardia cycle length.  For the more medial tricuspid  annulus, post pacing interval was 403 msec.   Activation mapping was then used to try to identify the site of earliest  atrial activation.  At the site of successful ablation at -43 msec prior to  onset of surface P with a very fractionated electrogram, RF was delivered  with immediate termination of tachycardia.  A total of 60 seconds of RF was  placed at this site for a total application of RF energy at multiple sites  of three minutes and 59 seconds.  Hereafter the clinical tachycardia was  noninducible.   FLUOROSCOPY TIME:  A total of 15 minutes and 4 seconds of fluoroscopy time  was utilized with the RAO camera, the LAO camera time was inadvertently  turned off before it was recorded.   I should mention that at the end of the procedure, a nonclinical atrial  tachycardia with a cycle length of 460 msec that was nonsustained was  induced.  It lasted about 15 to 18 seconds and then stopped.   IMPRESSION:  1.  Normal sinus function.  2.  Abnormal atrial function manifested by a low right atrial tachycardia      with origins and descriptions as characterized above.  This was      successfully eliminated with radio frequency catheter ablation.  3.  Normal AV nodal function.  4.  Normal His-Purkinje system function.  5.  No accessory pathway.  6.  Normal ventricular response to programmed stimulation.   SUMMARY:  In conclusion, the results of electrophysiologic testing  demonstrated that the patient's arrhythmia was not in fact an atrial flutter  with slow isthmus conduction but an atrial tachycardia originating in the  low septal right atrium.  RF ablation successfully eliminated this  tachycardia, rendering it noninducible.   The patient did have a nonclinical inducible tachycardia at a cycle length of 460 msec that was self terminating after just a few seconds.   In  conclusion, the results of electrophysiologic testing demonstrated that  the patient's arrhythmia was in fact an atrial tachycardia that had a focal  origin in the posterior septal right atrium just inferior posterior to the  coronary sinus.  RF ablation successfully eliminated this tachycardia.   The patient will be admitted to hospital for anticoagulation overlap, given  her Bjork-Shiley mitral valve.  Her INR today was 1.9.  She took her  Coumadin yesterday.  We will have to see what it is tomorrow and I will get  Dr. Juanda Chance to assist with this as I will be out of town tomorrow.           ______________________________  Duke Salvia, M.D.  SCK/MEDQ  D:  10/10/2005  T:  10/11/2005  Job:  [id number]   cc:   Charlies Constable, M.D. [facility]  [address]  [address]  [place]  [place] [address]   Electrophysiology lab

## 2010-09-28 NOTE — Discharge Summary (Signed)
[name] [name], [name] [name]             ACCOUNT NO.:  [id number]   MEDICAL RECORD NO.:  [id number]          PATIENT TYPE:  INP   LOCATION:  2009                         FACILITY:  [facility]   PHYSICIAN:  Luther Bing, M.D. [facility] OF BIRTH:  [birth year]   DATE OF ADMISSION:  08/23/2005  DATE OF DISCHARGE:  08/25/2005                                 DISCHARGE SUMMARY   PRIMARY CARDIOLOGIST:  Charlies Constable, M.D.   ELECTROPHYSIOLOGIST:  Duke Salvia, M.D.   PRIMARY CARE PHYSICIAN:  Lorie Phenix, M.D.   PRINCIPAL DIAGNOSIS:  Atrial flutter with rapid ventricular response.   OTHER DIAGNOSES:  1.  Chest pain.  2.  History of transient ischemic attack.  3.  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  4.  Chronic anticoagulation.  5.  Ongoing tobacco abuse.  6.  Anxiety.  7.  Gastroesophageal reflux disease.  8.  Seasonal allergies.   ALLERGIES:  CODEINE.   PROCEDURES:  DC cardioversion.   HISTORY OF PRESENT ILLNESS:  A 70 year old white female with a prior history  of tobacco abuse, status post MVR in 1980, with chronic anticoagulation, who  was in her usual state of health until August 23, 2005, when she was at work  and developed increased heart rate with anterior chest heaviness and  tingling in her fingers lasting less than five minutes, associated with  shortness of breath.  She went home and still felt a little bit  uncomfortable and called into our office and was referred to the [facility]  ED.  In the ED she was noted to be in atrial flutter with rapid ventricular  response and was admitted for further evaluation.   HOSPITAL COURSE:  [name] [name] had mild elevation of troponin to a peak of  0.10 but normal CKs and MBs.  She was placed on initially a diltiazem drip;  however, this was discontinued in favor of diltiazem 60 mg q.6h. with heart  rates sustaining in the 100 range and occasionally going up as high as 120-  130 with  exertion.  Arrangements were made for DC cardioversion and this  took place on April 15 with one 75-joule synchronized discharge and  successful conversion to sinus rhythm.  We will convert her diltiazem to  diltiazem ER 240 mg daily.  She has remained on Coumadin therapy.  We will  have her follow up with Dr. Graciela Husbands for potential of atrial flutter ablation  in the near future.  She has been counseled regarding the importance of  smoking cessation and is being discharged home today in satisfactory  condition.   DISCHARGE LABORATORY DATA:  Hemoglobin 15.1, hematocrit 44.9, WBC 11.4,  platelets 280, MCV 86.7.  Sodium 135, potassium 3.6, chloride 103, CO2 23,  BUN 7, creatinine 7, creatinine 0.7, glucose 100.  PT 26.2, INR 2.4.  Total  bilirubin 1.1, alkaline phosphatase 124, AST 20, ALT 18, albumin 3.7.  Peak  troponin I 0.10, CK 87, MB 3.6.  Total cholesterol 288, triglycerides 172,  HDL 47, LDL 207.  Calcium 9.3.  TSH 2.339.   DISPOSITION:  The patient is being discharged home today in good condition.   FOLLOW-UP PLANS AND APPOINTMENTS:  We will contact her with an appointment  for follow-up with Dr. Juanda Chance in approximately two weeks and with Dr. Graciela Husbands  on a first-available basis.  We will also arrange for the patient to undergo  an exercise stress test to rule out ischemia.   DISCHARGE MEDICATIONS:  1.  Coumadin as directed.  2.  Prozac 20 mg daily.  3.  Prilosec 20 mg daily.  4.  Claritin 10 mg daily.  5.  Vitamin B12 1000 mcg daily.  6.  Multivitamin one daily.  7.  Restoril 30 mg q.h.s.  8.  Diltiazem ER 240 mg daily.  9.  Nicotine patch 21 mg daily x6 weeks, then 14 mg x2 weeks, then 7 mg x2      weeks.   OUTSTANDING LAB STUDIES:  None.   DURATION OF DISCHARGE ENCOUNTER:  45 minutes including physician time.      Ok Anis, NP      [place] Bing, M.D. [facility]  Electronically Signed    CRB/MEDQ  D:  08/25/2005  T:  08/26/2005  Job:  [id number]   cc:    Lorie Phenix  Fax: [phone]

## 2010-09-28 NOTE — Discharge Summary (Signed)
[name] [name], APEL             ACCOUNT NO.:  [id number]   MEDICAL RECORD NO.:  [id number]          PATIENT TYPE:  OIB   LOCATION:  3702                         FACILITY:  [facility]   PHYSICIAN:  Duke Salvia, M.D.  DATE OF BIRTH:  [birth year]   DATE OF ADMISSION:  10/10/2005  DATE OF DISCHARGE:  10/11/2005                                 DISCHARGE SUMMARY   ALLERGIES:  CODEINE.   PRINCIPAL DIAGNOSES:  1.  Discharge on day one, status post electrophysiology study/radiofrequency      catheter ablation of atrial tachycardia.  2.  Atrial tachycardia originated in the low posterior septal right atrium,      inferoposterior to the coronary sinus.  3.  Subtherapeutic INR on admission, 1.9.  4.  Home dose 5 mg daily of Coumadin.  5.  Recurrent history of tachy palpitation.   SECONDARY DIAGNOSES:  1.  A history of mitral valve replacement for mitral regurgitation in 1980.  2.  A history of transient ischemic attacks, even on Coumadin.  INR target      is 3-4.  Also she is on aspirin.  3.  Myoview study, August 29, 2005.  Defect in the mid to distal anterior      wall.  Wall motion is normal.  EF is 72%.  4.  Catheterization September 08, 2005.  Ejection fraction 60%.  Overall wall      motion is good.  Nonobstructive coronary artery disease.  5.  Echocardiogram Sep 10, 2004, ejection fraction normal.  No mitral      regurgitation.   PROCEDURE:  Oct 10, 2005 electrophysiology study radiofrequency catheter  ablation of atrial tachycardia, Dr. Sherryl Manges.  The patient has had no  post-procedural complications.  Her INR was subtherapeutic on admission,  1.9.  Post procedure day number one, it was 1.7 after being given 7.5 mg on  the evening of May 31st.  The patient has been maintained on IV heparin  throughout this hospitalization.   BRIEF HISTORY:  [name] [name] is a 70 year old female.  She has had problems  with recurrent tachy palpitations.  She presented to the hospital August 23, 2005.  She was found to be in what is thought of as atrial flutter with 2:1  conduction.  This terminated spontaneously.   Her thromboembolic risk factors are notable for a mitral valve as well as  prior transient ischemic attacks, even while on Coumadin.  They affected the  left arm and the left leg and lasted just until she got to the emergency  room.   She is not known to have atrial fibrillation.  She has normal left  ventricular function.   The patient and husband would like to discuss the possibility of catheter  ablation.  The potential benefits and risks were discussed with the patient,  and she is willing to proceed.  This will be done on an elective basis at  [facility].   HOSPITAL COURSE:  The patient presented to [facility] May 31st.  She underwent electrophysiology study.  Her dysrhythmia is not atrial  flutter, but it is  atrial tachycardia with a low posterior septal right  atrial origination.  It is inferoposterior to the coronary sinus.  It was  eliminated with radiofrequency catheter ablation, and inducibility was not  successful post procedure.  The patient has had subtherapeutic INR  throughout this admission.  On admission on May 31st, it was 1.9.  On the  day of discharge, June 1, it is 1.7.  She will go home on a weekend of  Lovenox.  She will have six syringes, which will cover her for 72 hours and  then will present to the [facility] on Tuesday, June 5th at  11:30.   The patient discharges  on Coumadin as follows.  She has 5-mg tablets.  She  is to take 1-1/2 tablets Friday, Saturday and Sunday and then begin 5 mg on  Monday, June 4th.  She presents to the Coumadin Clinic Tuesday, June 5th.  She will continue on Prozac 20 mg daily, aspirin 81 mg daily, temazepam 30  mg at bedtime, Prilosec 20 mg daily,  Claritin 10 mg daily, multivitamin  daily and diltiazem XR 240 mg daily.   PERTINENT LABORATORIES THIS ADMISSION:  Complete blood  count:  White cells  7.9, hemoglobin 14.6, hematocrit 42.4, platelets 291.  Serum electrolytes:  Sodium 141, potassium 3.8, chloride 104, bicarbonate 29, glucose 110, BUN 7,  creatinine 0.8.  On May 29th, the protime was 18.4, INR 2.1, still below the  target 3-4 range necessary for this patient.   We recommend that she increase the Coumadin dose as an outpatient.  She will  also follow up with Dr. Graciela Husbands Tuesday, June 26th at 11:30.      Maple Mirza, P.A.    ______________________________  Duke Salvia, M.D.    GM/MEDQ  D:  10/11/2005  T:  10/11/2005  Job:  [id number]   cc:   Duke Salvia, M.D.  [address]  [address]  [place]  [place] [address]   Lorie Phenix  Fax: [phone]

## 2010-09-28 NOTE — Cardiovascular Report (Signed)
NAMECLYDELL, ALBERTS             ACCOUNT NO.:  [id number]   MEDICAL RECORD NO.:  [id number]            PATIENT TYPE:   LOCATION:                                 FACILITY:   PHYSICIAN:  Bruce R. Juanda Chance, MD, FACCDATE OF BIRTH:  [birth year]   DATE OF PROCEDURE:  09/05/2005  DATE OF DISCHARGE:                            CARDIAC CATHETERIZATION   CLINICAL HISTORY:  Ms. Totino is 70 years old and mitral valve replaced  in 1980, and was hospitalized with atrial flutter, and had positive  enzymes, and subsequently had a Myoview scan which was positive, so she  was brought in for cardiac catheterization.   PROCEDURE:  The procedure was performed via the right femoral artery  using an arterial sheath and 6-Frnech preformed coronary catheters.  A  front wall __________  was performed.  Omnipaque contrast was used.  The  patient tolerated the procedure well and left the laboratory in  satisfactory condition.   </RESULTS>  1. The left main coronary artery was free of significant disease.  2. Left anterior descending artery __________  gave rise to a diagonal      branch and two septal perforators.  These and the LAD proper were      free of significant disease.  3. __________  gave rise to 2 marginal branches and 2 posterolateral      branches.  There was 40% narrowing in the first marginal branch.  4. The right coronary artery was __________  gave rise to a right      ventricular branch, posterior descending branch, and posterolateral      branch.  There was 40% narrowing in the proximal right coronary      artery.   LEFT VENTRICULOGRAM:  The left ventriculogram was performed in the RAO  projection.  It showed a slight hypokinesis of the anterolateral wall.  The overall wall motion was quite good with an estimated ejection  fraction of 60%.  There was Bjork-Shiley prosthesis with a 1+ mitral  regurgitation.   HEMODYNAMIC DATA:  The aortic pressure was 143/65, mean 98.  Left ventricle  pressure was 143/12.   CONCLUSION:  1. Nonobstructive coronary artery disease with 40% narrowing in the      first marginal branch of the circumflex artery, no significant      obstruction of the left anterior descending artery, and 40%      narrowing of the proximal right coronary artery with slight      anterolateral wall hypokinesis and an estimated ejection fraction      of 60%.  2. Status post Bjork-Shiley mitral valve replacement.   RECOMMENDATIONS:  Reassurance.      Bruce Elvera Lennox Juanda Chance, MD, [facility]  Electronically Signed    BRB/MEDQ  D:  04/09/2006  T:  04/09/2006  Job:  [id number]

## 2010-09-28 NOTE — Cardiovascular Report (Signed)
NAMESHAQUAN, PUERTA             ACCOUNT NO.:  [id number]   MEDICAL RECORD NO.:  [id number]          PATIENT TYPE:  OIB   LOCATION:  NA                           FACILITY:  [facility]   PHYSICIAN:  Charlies Constable, M.D. [facility] DATE OF BIRTH:  [birth year]   DATE OF PROCEDURE:  09/05/2005  DATE OF DISCHARGE:                              CARDIAC CATHETERIZATION   INDICATIONS:  Ms. Garcilazo is 70 years old and in 1980 had Bjork-Shiley  mitral valve replacement for mitral regurgitation.  She has done well since  that time, although she has had TIAs and has been kept on aspirin along with  her Coumadin.  She recently was admitted with an episode of atrial flutter  and converted to sinus rhythm, but her troponins were positive and she  subsequently had a Cardiolite scan which suggested anterior ischemia.  For  this reason, she was scheduled to come in for angiography.  She has been  bridged with Lovenox as her Coumadin was tapered and her INR was 1 today.   PROCEDURE NOTE:  The procedure was performed via the right femoral artery  with arterial sheath and 6-French coronary.  Procedure was performed and  Omnipaque contrast was used.  The patient tolerated the procedure well and  left the laboratory in satisfactory condition.   RESULTS:  The left main coronary artery is free of disease.   Left anterior descending artery gave rise to diagonal branch and two septal  perforators.  These were free of significant disease.   The circumflex gave rise to a first marginal branch, a second marginal  branch and two posterolateral branches.  There was 4% narrowing in the first  marginal branch.   The coronary femoral artery gave rise to a right ventricle branch, posterior  descending branch and posterolateral branch.  There is 4 cm proximal portion  of the right coronary.   The left ventriculogram performed in the RAO position showed good wall  motion.  There was slight hypokinesis of the anterolateral wall  toward the  apex.  The overall motion was good with estimated fraction of 60%.  The  Bjork-Shiley mitral prosthesis appeared to be moving well.  There was trace  to 1+ mitral regurgitation.   HEMODYNAMIC DATA:  Left ventricle pressure 162/12 and aortic pressure 162/65  with a mean of 98.   CONCLUSION:  1.  Nonobstructive coronary artery disease with no significant obstruction      in the left anterior descending artery, 40% narrowing in the first      marginal branch of circumflex artery, 40% narrowing of proximal right      coronary and overall normal LV function with slight hypokinesis of the      anterolateral wall.  2.  Status post prior Bjork-Shiley and mitral valve replacement.   RECOMMENDATIONS:  The patient has only nonobstructive coronary disease.  In  view of these findings, I think the Cardiolite scan represents a false  positive study.  Will plan reassurance.  Will start her back on Coumadin  tonight and give her Lovenox in about 2 hours to bridge her until her  INR is  therapeutic.   ADDENDUM:  We are going to start her back on 7.5 mg of Coumadin tonight and  tomorrow night and then 5 mg daily until Monday when she comes into the  Coumadin Clinic.  Continue Lovenox until then as well.  Lovenox probably  needs to be continued until INR is greater than 2.5.           ______________________________  Charlies Constable, M.D. [facility]     BB/MEDQ  D:  09/05/2005  T:  09/06/2005  Job:  [id number]   cc:   Lorie Phenix  Fax: [phone]

## 2010-09-28 NOTE — Consult Note (Signed)
NAME:  [name] [name], [name] [name]                       ACCOUNT NO.:  [id number]   MEDICAL RECORD NO.:  [id number]                   PATIENT TYPE:  INP   LOCATION:  5743                                 FACILITY:  [facility]   PHYSICIAN:  Graylin Shiver, M.D.                DATE OF BIRTH:  [birth year]   DATE OF CONSULTATION:  04/30/2003  DATE OF DISCHARGE:                                   CONSULTATION   REFERRING PHYSICIAN:  Dr. Candyce Churn.   REASON FOR CONSULTATION:  The patient is a 70 year old Caucasian female who  came to the emergency room with complaints of abdominal pain which was  generalized; this was associated with vomiting and diarrhea; this started  yesterday.  She was admitted to the hospital, started on IV fluids and also  IV Cipro and Flagyl.  It was felt that she needed to be admitted because of  her symptoms.  Her white blood cell count on admission was 22,500; today, it  is 15,900.  Her hemoglobin and hematocrit are 14.9 and 42.8.  Today, she  feels much better.  She is sitting up, no longer complaining of abdominal  pain, no longer vomiting; she is sitting up, eating a liquid tray right now.  It was felt that she probably had a gastroenteritis.   A CT scan of the abdomen and pelvis was done last night and she had some  fluid in her colon, but it was otherwise a negative scan.  Stool for enteric  pathogens, ova and parasites and Clostridium difficile toxin are pending.   MEDICAL PROBLEMS:  1. Valvular heart disease, status post mitral valve replacement in the past.  2. Depression.  3. History of CVA.   MEDICATIONS:  Plavix, Prozac, Claritin, Prilosec, Coumadin.   ALLERGIES:  Allergies to ASPIRIN and CODEINE.   SOCIAL HISTORY:  Smokes cigarettes, denies alcohol.   SYSTEMS REVIEW:  No anginal chest pains, shortness of breath, cough or  sputum production.   PHYSICAL EXAMINATION:  VITAL SIGNS:  Vital signs are stable.  GENERAL:  She does not appear in any acute  distress.  HEENT:  She is nonicteric.  HEART:  Regular rhythm.  Mitral valve click heard.  LUNGS:  Lungs clear.  ABDOMEN:  Bowel sounds normal and soft.  Tenderness.  No hepatosplenomegaly.   IMPRESSION:  Acute gastroenteritis seems to be clinically improving.   PLAN:  Continue IV hydration.  Continue current antibiotics, pending stool  culture check.  Advance diet as tolerated.                                               Graylin Shiver, M.D.    SFG/MEDQ  D:  04/30/2003  T:  05/02/2003  Job:  [id number]   cc:   Candyce Churn,  M.D.  [address] [place]  [place] [address]  Fax: [phone]

## 2010-09-28 NOTE — H&P (Signed)
NAMELAYNE, [name] [name]             ACCOUNT NO.:  [id number]   MEDICAL RECORD NO.:  [id number]          PATIENT TYPE:  EMS   LOCATION:  MAJO                         FACILITY:  [facility]   PHYSICIAN:  Rollene Rotunda, M.D.   DATE OF BIRTH:  1941-03-28   DATE OF ADMISSION:  08/23/2005  DATE OF DISCHARGE:                                HISTORY & PHYSICAL   PRIMARY CARE PHYSICIAN:  Dr. Lorie Phenix   CARDIOLOGIST:  Dr. Charlies Constable   REASON FOR ADMISSION:  Evaluate patient with atrial flutter with rapid  ventricular response.   HISTORY OF PRESENT ILLNESS:  The patient is a very pleasant 70 year old with  a history of mitral valve replacement in 1980.  She has had occasional  palpitations over time.  Today around 10 o'clock she developed rapid heart  rate and a pounding in her chest.  She put up with this all day long.  She  had some chest heaviness and some tingling in her fingers.  She was slightly  short of breath with this.  She was at work at the time and went home to lie  down.  It persisted throughout the day.  She finally called the office and  was referred to the emergency room.  She did not have any presyncope or  syncope.  She did not have any jaw discomfort or shoulder discomfort.  She  did not have any diaphoresis.  She has been slightly anxious throughout the  day.   Of note, the patient had been on metoprolol which was stopped last August.  It was stopped because she was no longer having any palpitations.  She does  drink caffeine frequently throughout the day.  She has been taking Claritin-  D.   PAST MEDICAL HISTORY:  TIA, mitral regurgitation.   PAST SURGICAL HISTORY:  Mitral valve replacement with a Bjork-Shiley valve  on chronic Coumadin therapy (she has had therapeutic Coumadin levels 3.0  last Thursday and approximately 1 month ago), cholecystectomy.   ALLERGIES:  CODEINE.   MEDICATIONS:  Coumadin, Prozac 20 mg daily, temazepam 30 mg daily, vitamin  B12,  Women's Day vitamin, Prilosec 20 mg daily, Excedrin, Claritin, vitamin  C.   SOCIAL HISTORY:  The patient lives in [place] with her husband.  She  is Conservation officer, nature at [employer].  She smokes cigarettes 1-1/2 packs per  day.  She drinks a lot of caffeine.   FAMILY HISTORY:  Contributory for father dying of myocardial infarction at  age 74.   REVIEW OF SYSTEMS:  As stated in the HPI positive for reading glasses,  menopause, bilateral hip and knee pains.  Review negative for all other  systems.   PHYSICAL EXAMINATION:  GENERAL:  The patient appears anxious but in no  distress.  VITAL SIGNS:  Blood pressure 150/103 initially, heart rate 150s initially,  afebrile.  HEENT:  Eyes unremarkable; pupils equal, round, react to light; fundi not  visualized; oral mucosa unremarkable.  NECK:  No jugular venous distention; waveform within normal limits; carotid  upstroke brisk and symmetric; no bruits, no thyromegaly.  LYMPHATICS:  No  cervical, axillary, or inguinal adenopathy.  LUNGS:  Clear to auscultation bilaterally.  BACK:  No costovertebral angle tenderness.  CHEST:  Well-healed sternotomy scar.  HEART:  PMI not displaced or sustained; mechanical S1, S2 within normal  limits, no S3, no S4, no murmurs.  ABDOMEN:  Flat, positive bowel sounds, normal in frequency and pitch; mild  diffuse tenderness without rebound or guarding; no hepatomegaly, no  splenomegaly.  SKIN:  No rashes, no nodules.  EXTREMITIES:  2+ pulses throughout; no cyanosis, no clubbing.  NEUROLOGICAL:  Oriented to person, place, and time.  Cranial nerves II-XII  grossly intact.  Motor grossly intact.   ELECTROCARDIOGRAM:  Atrial flutter with a rate of 150, axis within normal  limits, intervals within normal limits.   LABORATORIES:  Pending.   CHEST X-RAY:  Pending.   ASSESSMENT AND PLAN:  Problem 1. ATRIAL FLUTTER.  The patient was in atrial  flutter on admission.  She did get some adenosine which slowed it  down and  demonstrated the flutter waves.  Interestingly, she has a flutter rate of  approximately 150 and was having 1:1 conduction.  She was started on IV  Cardizem and now has continued flutter but with a rate of 2:1.  She is now  having no symptoms.   The patient will continue on the Cardizem.  She has been on therapeutic  anticoagulation and can have cardioversion on Monday.  Ultimately, we should  consider flutter ablation in this patient.   Problem 2. CHEST DISCOMFORT.  The patient has had some chest discomfort.  We  will cycle enzymes.  I would suggest that if these are negative she should  still have a stress perfusion study as an outpatient.   Problem 3. MITRAL VALVE REPLACEMENT.  The patient is not having any symptoms  related to this.  She has had therapeutic anticoagulation.   Problem 4. ANXIETY.  She will continue Prozac and other medications.  May  have p.r.n. benzodiazepines.   Problem 5. TOBACCO.  She will be educated about the need to stop smoking.           ______________________________  Rollene Rotunda, M.D.     JH/MEDQ  D:  08/23/2005  T:  08/23/2005  Job:  [id number]   cc:   Charlies Constable, M.D. [facility]  [address]  [address]  [place]  [place] [address]   Lorie Phenix  Fax: [phone]

## 2010-09-28 NOTE — Discharge Summary (Signed)
[name] [name], [name] [name]             ACCOUNT NO.:  [id number]   MEDICAL RECORD NO.:  [id number]          PATIENT TYPE:  INP   LOCATION:  1824                         FACILITY:  [facility]   PHYSICIAN:  Charlies Constable, M.D. [facility] DATE OF BIRTH:  12-08-1940   DATE OF ADMISSION:  09/06/2004  DATE OF DISCHARGE:  09/06/2004                           DISCHARGE SUMMARY - REFERRING   Please refer to dictated History and Physical.   Dr. Edwyna Shell and Gene Serpe, P.A.  saw [name] [name] in the emergency room  evaluation of tachy palpitations.  The patient was never admitted to the  hospital, and she was released from the emergency room with plans for  outpatient management.  Outpatient management was outlined in the dictated  History and Physical exam to include:  1.  Lopressor 25 mg q.8h.  2.  Coumadin 5 mg daily except 7.5 mg on Thursday and Sunday.   She will keep her scheduled appointment with the Coumadin clinic on  Thursday, Sep 13, 2004. She will have an outpatient 2-D echocardiogram in 30  days, event monitor, and she will return to the office on Tuesday, Oct 02, 2004, at 4:30 to be seen by Dr. Juanda Chance.  She also was asked to change  Claritin D to plain Claritin.      Joellyn Rued, P.A. [facility]    ______________________________  Charlies Constable, M.D. [facility]    EW/MEDQ  D:  01/09/2005  T:  01/09/2005  Job:  [id number]   cc:   Beatris Si

## 2010-09-28 NOTE — Assessment & Plan Note (Signed)
[facility]                              CARDIOLOGY OFFICE NOTE   NAME:[name] [name] KACHEL                    MRN:          [id number]  DATE:01/09/2006                            DOB:          1941-02-19    PRIMARY CARE PHYSICIAN:  Dr. Lorie Phenix.   CLINICAL HISTORY:  [name] [name] is 70 years old and had a Bjork-Shiley mitral  valve replacement for mitral regurgitation in 1980.  She has a history of  TIA since that time.  Has been on Coumadin and aspirin because of this.  Her  INRs have been targeted at 3-4.  She was admitted earlier this year with  atrial flutter and had positive troponin.  Subsequently underwent  catheterization and was found to have only mild nonobstructive disease.  She  subsequently underwent atrial flutter/ablation by Dr. Graciela Husbands.   She has been doing well since that time, although she said she has had some  difficulty getting her INR therapeutic.  It was therapeutic today at 3.6.  She has had no recent symptoms of chest pain, shortness of breath, or  palpitations.   Her past medical history is negative for hypertension and diabetes.   Her current medications include aspirin, Coumadin, temazepam, Prilosec,  Claritin, Prozac, and Excedrin.   Her past medical history is significant for GERD.   PHYSICAL EXAMINATION:  VITAL SIGNS:  Blood pressure 148/76, pulse 75 and  regular.  NECK:  There was no venous distention.  The carotid pulses were full without  bruits.  CHEST:  Clear.  CARDIAC:  Rhythm was regular.  No murmurs or gallops.  ABDOMEN:  Soft without organomegaly.  EXTREMITIES:  Peripheral pulses were full.  There was no peripheral edema.   IMPRESSION:  1. Valvular disease, status post Bjork-Shiley mitral valve replacement for      mitral regurgitation in 1980, now stable.  2. History of transient ischemic attacks, on Coumadin.  Now on Coumadin      and aspirin.  3. Nonobstructive disease at recent  catheterization.  4. Status post recent atrial flutter/ablation.  5. Good left ventricular function.   RECOMMENDATIONS:  I think [name] [name] is doing well.  We will not make any  changes in her medications.  We will plan to see her back in followup in one  year.                               Bruce Elvera Lennox Juanda Chance, MD, [facility]    BRB/MedQ  DD:  01/09/2006  DT:  01/09/2006  Job #:  [id number]

## 2010-09-28 NOTE — H&P (Signed)
NAME:  [name] [name], [name] [name]             ACCOUNT NO.:  [id number]   MEDICAL RECORD NO.:  [id number]          PATIENT TYPE:  INP   LOCATION:  1824                         FACILITY:  [facility]   PHYSICIAN:  Charlies Constable, M.D. [facility] DATE OF BIRTH:  April 23, 1941   DATE OF ADMISSION:  09/06/2004  DATE OF DISCHARGE:                                HISTORY & PHYSICAL   [name] [name] is a 70 year old female with no known coronary artery disease,  status post remote Bjork-Shiley mitral valve replacement, on chronic  Coumadin, followed by Charlies Constable, M.D., who presents to the emergency room  with complaint of new onset tachypalpitations associated with dyspnea,  lightheadedness and chest pressure.   The patient describes an episode lasting approximately 45 minutes earlier  this morning where she felt the sensation of her heart beating both hard and  fast.  She reportedly took a pulse with a reading in the 140 bpm range.  She  also had some associated left hand tingling, which has since improved.  Following this episode, she felt weak and washed out.   The patient contacted our office and was directed to the emergency room.  On  admission, electrocardiogram revealed normal sinus rhythm with no ischemic  changes.  The patient is currently asymptomatic.   ALLERGIES:  CODEINE.   MEDICATIONS PRIOR TO ADMISSION:  1.  Coumadin 5 mg daily.  2.  Prilosec.  3.  Aspirin 81 mg daily.  4.  Prozac 30 mg daily.  5.  Restoril 30 mg q.h.s.  6.  Claritin D one tablet daily.   PAST MEDICAL HISTORY:  1.  Status post mitral valve replacement in 1980 (Bjork-Shiley) by [name] [name], M.D.  2.  History of TIAs.  Normal left ventricular function by echocardiogram in      2001.  3.  Longstanding tobacco smoking.  4.  Status post cholecystectomy.  5.  History of rheumatic fever.   SOCIAL HISTORY:  The patient lives in [place], [place], with her  husband.  She continues to work part-time in a [employer].  They have one grown son.  She has a 24-pack-year history of tobacco smoking.  Denies alcohol use.   FAMILY HISTORY:  Father deceased at age 73 secondary to fatal myocardial  infarction (first).  Siblings:  The patient has a brother deceased in his  35s secondary to MI.   REVIEW OF SYSTEMS:  Chronic exertional dyspnea and chronic exertional chest  discomfort with no recent exacerbation of either.  Denies orthopnea, PND or  pedal edema.  Reports rare palpitations.  Reports left lower extremity  claudication.  Occasional heartburn symptoms.  Denies any recent evidence of  upper or lower GI bleeding.   PHYSICAL EXAMINATION:  VITAL SIGNS:  Blood pressure 175/79 on admission.  Pulse 90s and regular.  Respirations 16.  SAO2 98% (room air).  GENERAL APPEARANCE:  A 70 year old female in no apparent distress.  HEENT:  Normocephalic and atraumatic.  NECK:  Preserved bilateral carotid pulses without bruits.  No JVD.  LUNGS:  Clear to auscultation in all fields.  HEART:  Regular rate and rhythm (S1 and S2).  Soft grade 1/6 systolic  ejection murmur.  Crisp S1 with rumble-like S2 sound.  No diastolic  murmur.  ABDOMEN:  Soft and nontender.  Intact bowel sounds without bruits.  EXTREMITIES:  Preserved bilateral femoral pulses with soft bilateral bruits  (left greater than right).  Intact distal pulses without edema.  NEUROLOGIC:  No focal deficits.  Grossly intact cranial nerves II-XII.  Preserved bilateral strength in both upper and lower extremities.   LABORATORY DATA:  Admission chest x-ray:  No acute disease.   Electrocardiogram:  Normal sinus rhythm at 88 bpm with normal access; no  acute changes.   Hemoglobin 15.5, hematocrit 44.8, WBC 8.7, platelets 279.  Sodium 136,  potassium 3.7, BUN 3, creatinine 0.7, glucose 90.  CPK 88/1.7, troponin I  0.01.  Elevated alkaline phosphatase 141.  INR 2.2.   IMPRESSION:  1.  Symptomatic tachypalpitations.  2.  Status post mitral  valve replacement (Bjork-Shiley) in 1990.      1.  Chronic Coumadin.  3.  Chronic exertional dyspnea.  4.  Exertional chest pressure.  5.  Left lower extremity claudication.  6.  Longstanding tobacco.  7.  History of transient ischemic attacks.   PLAN:  The patient was seen and examined in the emergency room by Charlies Constable, M.D., who felt that the patient could be further evaluated as an  outpatient.  Serial cardiac markers are negative.  Electrocardiogram  revealed no ischemic changes.  Will therefore treat the patient with the  addition of Lopressor 25 mg q.8h. and adjust Coumadin slightly to 5 mg  daily, except 7.5 mg every Thursday and Sunday.  The patient will keep a  previously scheduled followup appointment in our Coumadin clinic on  Thursday, Sep 13, 2004.  Additionally, we will schedule her for an  outpatient 2-D echocardiogram and a 30-day event monitor.  She will then  return to the office on Tuesday, Oct 02, 2004, at 4:30 p.m. to follow up  with Charlies Constable, M.D.  Of note, I also instructed Ms. Eberwein to switch  Claritin D to plain Claritin without the decongestant component.      GS/MEDQ  D:  09/06/2004  T:  09/06/2004  Job:  [id number]   cc:   Lorie Phenix  [address]  [place]  [place] [address]  Fax: [phone]

## 2010-10-10 ENCOUNTER — Ambulatory Visit (INDEPENDENT_AMBULATORY_CARE_PROVIDER_SITE_OTHER): Payer: Medicare PPO | Admitting: Emergency Medicine

## 2010-10-10 DIAGNOSIS — G459 Transient cerebral ischemic attack, unspecified: Secondary | ICD-10-CM

## 2010-10-10 DIAGNOSIS — Z9889 Other specified postprocedural states: Secondary | ICD-10-CM

## 2010-10-10 DIAGNOSIS — I4891 Unspecified atrial fibrillation: Secondary | ICD-10-CM

## 2010-10-19 ENCOUNTER — Other Ambulatory Visit: Payer: Self-pay

## 2010-10-19 MED ORDER — METOPROLOL TARTRATE 50 MG PO TABS: 50.0000 mg | ORAL_TABLET | Freq: Two times a day (BID) | ORAL | Status: DC

## 2010-11-07 ENCOUNTER — Ambulatory Visit (INDEPENDENT_AMBULATORY_CARE_PROVIDER_SITE_OTHER): Payer: Medicare PPO | Admitting: Emergency Medicine

## 2010-11-07 DIAGNOSIS — Z9889 Other specified postprocedural states: Secondary | ICD-10-CM

## 2010-11-07 DIAGNOSIS — I4891 Unspecified atrial fibrillation: Secondary | ICD-10-CM

## 2010-11-07 DIAGNOSIS — G459 Transient cerebral ischemic attack, unspecified: Secondary | ICD-10-CM

## 2010-11-07 LAB — POCT INR: INR: 4.3

## 2010-11-28 ENCOUNTER — Ambulatory Visit (INDEPENDENT_AMBULATORY_CARE_PROVIDER_SITE_OTHER): Payer: Medicare PPO | Admitting: Emergency Medicine

## 2010-11-28 DIAGNOSIS — G459 Transient cerebral ischemic attack, unspecified: Secondary | ICD-10-CM

## 2010-11-28 DIAGNOSIS — I4891 Unspecified atrial fibrillation: Secondary | ICD-10-CM

## 2010-11-28 DIAGNOSIS — Z9889 Other specified postprocedural states: Secondary | ICD-10-CM

## 2010-12-19 ENCOUNTER — Ambulatory Visit (INDEPENDENT_AMBULATORY_CARE_PROVIDER_SITE_OTHER): Payer: Medicare PPO | Admitting: Emergency Medicine

## 2010-12-19 DIAGNOSIS — G459 Transient cerebral ischemic attack, unspecified: Secondary | ICD-10-CM

## 2010-12-19 DIAGNOSIS — I4891 Unspecified atrial fibrillation: Secondary | ICD-10-CM

## 2010-12-19 DIAGNOSIS — Z9889 Other specified postprocedural states: Secondary | ICD-10-CM

## 2011-01-09 ENCOUNTER — Ambulatory Visit (INDEPENDENT_AMBULATORY_CARE_PROVIDER_SITE_OTHER): Payer: Medicare PPO | Admitting: Emergency Medicine

## 2011-01-09 DIAGNOSIS — Z9889 Other specified postprocedural states: Secondary | ICD-10-CM

## 2011-01-09 DIAGNOSIS — I4891 Unspecified atrial fibrillation: Secondary | ICD-10-CM

## 2011-01-09 DIAGNOSIS — G459 Transient cerebral ischemic attack, unspecified: Secondary | ICD-10-CM

## 2011-01-09 LAB — POCT INR: INR: 5.2

## 2011-01-16 ENCOUNTER — Other Ambulatory Visit: Payer: Self-pay | Admitting: Cardiology

## 2011-01-16 ENCOUNTER — Ambulatory Visit (INDEPENDENT_AMBULATORY_CARE_PROVIDER_SITE_OTHER): Payer: Medicare PPO | Admitting: Emergency Medicine

## 2011-01-16 DIAGNOSIS — I4891 Unspecified atrial fibrillation: Secondary | ICD-10-CM

## 2011-01-16 DIAGNOSIS — G459 Transient cerebral ischemic attack, unspecified: Secondary | ICD-10-CM

## 2011-01-16 DIAGNOSIS — Z9889 Other specified postprocedural states: Secondary | ICD-10-CM

## 2011-01-16 LAB — POCT INR: INR: 2.6

## 2011-01-30 ENCOUNTER — Ambulatory Visit (INDEPENDENT_AMBULATORY_CARE_PROVIDER_SITE_OTHER): Payer: Medicare PPO | Admitting: Emergency Medicine

## 2011-01-30 DIAGNOSIS — I4891 Unspecified atrial fibrillation: Secondary | ICD-10-CM

## 2011-01-30 DIAGNOSIS — G459 Transient cerebral ischemic attack, unspecified: Secondary | ICD-10-CM

## 2011-01-30 DIAGNOSIS — Z9889 Other specified postprocedural states: Secondary | ICD-10-CM

## 2011-01-30 LAB — POCT INR: INR: 4.1

## 2011-02-01 LAB — CARDIAC PANEL(CRET KIN+CKTOT+MB+TROPI)
Relative Index: INVALID
Relative Index: INVALID
Total CK: 65
Troponin I: 0.01

## 2011-02-01 LAB — COMPREHENSIVE METABOLIC PANEL
ALT: 18
AST: 23
CO2: 28
Calcium: 9.9
GFR calc Af Amer: >60
GFR calc non Af Amer: >60
Potassium: 3.7
Sodium: 138
Total Protein: 6.9

## 2011-02-01 LAB — CBC
MCHC: 34
RBC: 5.17 — ABNORMAL HIGH

## 2011-02-01 LAB — TSH: TSH: 1.016

## 2011-02-04 LAB — COMPREHENSIVE METABOLIC PANEL
AST: 21
Albumin: 3.5
BUN: 6
Creatinine, Ser: 0.65
Potassium: 4.6
Sodium: 135
Total Protein: 6.8

## 2011-02-04 LAB — I-STAT 8, (EC8 V) (CONVERTED LAB)
Acid-Base Excess: 1
Chloride: 103
HCT: 41
Hemoglobin: 13.9
Operator id: 146091
Potassium: 3.9
Sodium: 131 — ABNORMAL LOW

## 2011-02-04 LAB — PROTIME-INR
INR: 1.9 — ABNORMAL HIGH
INR: 2.6 — ABNORMAL HIGH
INR: 2.8 — ABNORMAL HIGH
Prothrombin Time: 30.2 — ABNORMAL HIGH

## 2011-02-04 LAB — POCT CARDIAC MARKERS
CKMB, poc: <1 — ABNORMAL LOW
Troponin i, poc: <0.05

## 2011-02-04 LAB — LIPID PANEL
Cholesterol: 306 — ABNORMAL HIGH
LDL Cholesterol: 249 — ABNORMAL HIGH

## 2011-02-04 LAB — POCT I-STAT CREATININE: Operator id: 146091

## 2011-02-04 LAB — TROPONIN I: Troponin I: 0.01

## 2011-02-04 LAB — CBC
MCHC: 34.3
MCV: 85.3
RBC: 4.76

## 2011-02-04 LAB — CK TOTAL AND CKMB (NOT AT ARMC)
CK, MB: 1.9
Relative Index: INVALID

## 2011-02-04 LAB — TSH: TSH: 2.938

## 2011-02-04 LAB — APTT
aPTT: 39 — ABNORMAL HIGH
aPTT: 44 — ABNORMAL HIGH

## 2011-02-16 ENCOUNTER — Other Ambulatory Visit: Payer: Self-pay | Admitting: Cardiology

## 2011-02-20 ENCOUNTER — Ambulatory Visit (INDEPENDENT_AMBULATORY_CARE_PROVIDER_SITE_OTHER): Payer: Medicare PPO | Admitting: Emergency Medicine

## 2011-02-20 DIAGNOSIS — G459 Transient cerebral ischemic attack, unspecified: Secondary | ICD-10-CM

## 2011-02-20 DIAGNOSIS — I4891 Unspecified atrial fibrillation: Secondary | ICD-10-CM

## 2011-02-20 DIAGNOSIS — Z9889 Other specified postprocedural states: Secondary | ICD-10-CM

## 2011-02-20 LAB — POCT INR: INR: 3.3

## 2011-03-20 ENCOUNTER — Ambulatory Visit (INDEPENDENT_AMBULATORY_CARE_PROVIDER_SITE_OTHER): Payer: Medicare PPO | Admitting: Emergency Medicine

## 2011-03-20 DIAGNOSIS — G459 Transient cerebral ischemic attack, unspecified: Secondary | ICD-10-CM

## 2011-03-20 DIAGNOSIS — Z9889 Other specified postprocedural states: Secondary | ICD-10-CM

## 2011-03-20 DIAGNOSIS — I4891 Unspecified atrial fibrillation: Secondary | ICD-10-CM

## 2011-03-20 DIAGNOSIS — Z7901 Long term (current) use of anticoagulants: Secondary | ICD-10-CM

## 2011-03-20 LAB — POCT INR: INR: 2.2

## 2011-04-03 ENCOUNTER — Ambulatory Visit (INDEPENDENT_AMBULATORY_CARE_PROVIDER_SITE_OTHER): Payer: Medicare PPO | Admitting: Emergency Medicine

## 2011-04-03 DIAGNOSIS — G459 Transient cerebral ischemic attack, unspecified: Secondary | ICD-10-CM

## 2011-04-03 DIAGNOSIS — Z9889 Other specified postprocedural states: Secondary | ICD-10-CM

## 2011-04-03 DIAGNOSIS — Z7901 Long term (current) use of anticoagulants: Secondary | ICD-10-CM

## 2011-04-03 DIAGNOSIS — I4891 Unspecified atrial fibrillation: Secondary | ICD-10-CM

## 2011-04-03 LAB — POCT INR: INR: 4.2

## 2011-04-08 ENCOUNTER — Ambulatory Visit (INDEPENDENT_AMBULATORY_CARE_PROVIDER_SITE_OTHER): Payer: Medicare PPO | Admitting: Cardiology

## 2011-04-08 ENCOUNTER — Encounter: Payer: Self-pay | Admitting: Cardiology

## 2011-04-08 DIAGNOSIS — I6529 Occlusion and stenosis of unspecified carotid artery: Secondary | ICD-10-CM | POA: Insufficient documentation

## 2011-04-08 DIAGNOSIS — E785 Hyperlipidemia, unspecified: Secondary | ICD-10-CM

## 2011-04-08 DIAGNOSIS — I10 Essential (primary) hypertension: Secondary | ICD-10-CM

## 2011-04-08 DIAGNOSIS — F17200 Nicotine dependence, unspecified, uncomplicated: Secondary | ICD-10-CM

## 2011-04-08 DIAGNOSIS — R0989 Other specified symptoms and signs involving the circulatory and respiratory systems: Secondary | ICD-10-CM

## 2011-04-08 DIAGNOSIS — I4891 Unspecified atrial fibrillation: Secondary | ICD-10-CM

## 2011-04-08 DIAGNOSIS — Z9889 Other specified postprocedural states: Secondary | ICD-10-CM

## 2011-04-08 DIAGNOSIS — I059 Rheumatic mitral valve disease, unspecified: Secondary | ICD-10-CM

## 2011-04-08 LAB — CBC WITH DIFFERENTIAL/PLATELET
Basophils Absolute: 0.1 10*3/uL (ref 0.0–0.1)
Eosinophils Absolute: 0.2 10*3/uL (ref 0.0–0.7)
Lymphocytes Relative: 25.5 % (ref 12.0–46.0)
MCHC: 33.7 g/dL (ref 30.0–36.0)
Monocytes Relative: 5.5 % (ref 3.0–12.0)
Neutro Abs: 5.7 10*3/uL (ref 1.4–7.7)
Neutrophils Relative %: 65.7 % (ref 43.0–77.0)
Platelets: 289 10*3/uL (ref 150.0–400.0)
RDW: 14.3 % (ref 11.5–14.6)

## 2011-04-08 LAB — BASIC METABOLIC PANEL
CO2: 24 mEq/L (ref 19–32)
Calcium: 9.6 mg/dL (ref 8.4–10.5)
Creatinine, Ser: 0.7 mg/dL (ref 0.4–1.2)
GFR: 87.69 mL/min (ref >60.00)
Glucose, Bld: 99 mg/dL (ref 70–99)

## 2011-04-08 LAB — LDL CHOLESTEROL, DIRECT: Direct LDL: 226 mg/dL

## 2011-04-08 LAB — LIPID PANEL
HDL: 49.3 mg/dL (ref >39.00)
Triglycerides: 128 mg/dL (ref 0.0–149.0)

## 2011-04-08 NOTE — Assessment & Plan Note (Signed)
BP is high, but she says it runs in normal range when she checks at home.  I have asked her to check it daily for 2 weeks and record the readings.  We will call for numbers in 2 weeks.

## 2011-04-08 NOTE — Progress Notes (Signed)
PCP: Dr. Elease Hashimoto  69 yo with history of mitral regurgitation s/p MV replacement with Bjork-Shiley tilting disc valve in 1980 as well as paroxysmal atrial fibrillation and history of TIA presents for cardiology followup.  Patient has been seen in the past by Dr. Juanda Chance and is seen by me for the first time today.  Patient has been doing well in general.  She does continue to smoke 1 ppd.  She denies exertional dyspnea and is relatively active doing yardwork, etc.  No chest pain.  No tachypalpitations.  She is in sinus rhythm today.  No recent TIA-like symptoms.  Per her husband, she has mild dementia and has had some memory difficulty that has been fairly stable.  BP is high today, but husband says that it is within normal range whenever she checks it at home.   ECG: NSR, anterolateral shallow T wave inversions  PMH: 1. Mitral regurgitation s/p mitral valve replacement in 1980 with Bjork-Shiley tilting disc mechanical mitral valve.   Echo (4/09) with EF 55%, well-seated mechanical mitral valve with mean gradient 6 mmHg across the valve.  2. Atrial flutter s/p atrial flutter ablation.  3. Paroxysmal atrial fibrillation.  4. TIA 5. Dementia: Mild.  6. Active smoker 7. GERD 8. HTN 9. Depression  SH: Married, lives in [place] with husband.  Smokes 1 ppd.  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   FH: No premature CAD.   ROS: All systems reviewed and negative except as per HPI.   Current Outpatient Prescriptions  Medication Sig Dispense Refill  . Ascorbic Acid (VITAMIN C) 500 MG tablet Take 500 mg by mouth daily.        [name] aspirin EC 81 MG EC tablet Take 81 mg by mouth daily.        . citalopram (CELEXA) 40 MG tablet Take 40 mg by mouth daily.        . Cyanocobalamin (VITAMIN B 12) 250 MCG LOZG Take 500 mg by mouth daily.        . metoprolol (LOPRESSOR) 50 MG tablet TAKE ONE TABLET BY MOUTH TWICE DAILY  60 tablet  2  . Multiple Vitamins-Calcium (EQL ONE DAILY WOMENS PO)  Take 1 tablet by mouth daily.        [name] omeprazole (PRILOSEC) 20 MG capsule Take 20 mg by mouth daily.        . temazepam (RESTORIL) 30 MG capsule Take 30 mg by mouth at bedtime.        [name] warfarin (COUMADIN) 5 MG tablet TAKE ONE TABLET BY MOUTH AS DIRECTED  45 tablet  2    BP 159/76  Pulse 92  Ht 5\' 7"  (1.702 m)  Wt 71.578 kg (157 lb 12.8 oz)  BMI 24.72 kg/m2 General: NAD Neck: No JVD, no thyromegaly or thyroid nodule.  Lungs: Clear to auscultation bilaterally with normal respiratory effort. CV: Nondisplaced PMI.  Heart regular S1/S2, no S3/S4, 1/6 systolic murmur upper sternal border.  No peripheral edema.  Left carotid bruit.  Normal pedal pulses.  Abdomen: Soft, nontender, no hepatosplenomegaly, no distention.  Neurologic: Alert and oriented x 3.  Psych: Normal affect. Extremities: No clubbing or cyanosis.

## 2011-04-08 NOTE — Assessment & Plan Note (Signed)
Left carotid bruit, will get carotid dopplers.

## 2011-04-08 NOTE — Assessment & Plan Note (Signed)
Paroxysmal.  In NSR today.  She is on coumadin and takes metoprolol.

## 2011-04-08 NOTE — Patient Instructions (Signed)
Lab today--lipid profile/BMET /CBC 424.0  Take and record your blood pressure. I will call you in 2 weeks to get the readings. Luana Shu [phone]  Your physician has requested that you have an echocardiogram. Echocardiography is a painless test that uses sound waves to create images of your heart. It provides your doctor with information about the size and shape of your heart and how well your heart's chambers and valves are working. This procedure takes approximately one hour. There are no restrictions for this procedure. THIS CAN BE SCHEDULED IN [facility]  Your physician has requested that you have a carotid duplex. This test is an ultrasound of the carotid arteries in your neck. It looks at blood flow through these arteries that supply the brain with blood. Allow one hour for this exam. There are no restrictions or special instructions. THIS CAN BE SCHEDULED IN [facility]  Your physician wants you to follow-up in: 1 year with Dr Shirlee Latch. (November 2013).  You will receive a reminder letter in the mail two months in advance. If you don't receive a letter, please call our office to schedule the follow-up appointment. THIS CAN BE SCHEDULED IN [place]

## 2011-04-08 NOTE — Assessment & Plan Note (Signed)
Mechanical Bjork-Shiley tilting disc valve.  I will get an echo to reassess the valve.  She will continue ASA 81 mg daily and INR goal 3-3.5 (history of TIA).  She needs bridging heparin or Lovenox if she ever has to come off coumadin, and she needs antibiotic prophylaxis with dental work.

## 2011-04-08 NOTE — Assessment & Plan Note (Addendum)
Check lipids today 

## 2011-04-08 NOTE — Assessment & Plan Note (Signed)
I strongly encouraged her to quit smoking, she does not seem ready yet.

## 2011-04-15 ENCOUNTER — Telehealth: Payer: Self-pay | Admitting: *Deleted

## 2011-04-15 DIAGNOSIS — E785 Hyperlipidemia, unspecified: Secondary | ICD-10-CM

## 2011-04-15 DIAGNOSIS — I059 Rheumatic mitral valve disease, unspecified: Secondary | ICD-10-CM

## 2011-04-15 DIAGNOSIS — I10 Essential (primary) hypertension: Secondary | ICD-10-CM

## 2011-04-15 MED ORDER — ATORVASTATIN CALCIUM 20 MG PO TABS: 20.0000 mg | ORAL_TABLET | Freq: Every day | ORAL | Status: DC

## 2011-04-15 NOTE — Telephone Encounter (Signed)
Notes Recorded by Jacqlyn Krauss, RN on 04/15/2011 at 2:24 PM I discussed with pt's husband. Pt will start atorvastatin 20mg  daily. She will return for fasting lipid/liver profile 06/20/11. Notes Recorded by [name], MD on 04/12/2011 at 8:50 AM LDL is extremely high. Would suggest start atorvastatin 20 mg daily with lipids/LFTs in 2 months.

## 2011-04-24 ENCOUNTER — Ambulatory Visit (INDEPENDENT_AMBULATORY_CARE_PROVIDER_SITE_OTHER): Payer: Medicare PPO | Admitting: Emergency Medicine

## 2011-04-24 ENCOUNTER — Telehealth: Payer: Self-pay | Admitting: *Deleted

## 2011-04-24 DIAGNOSIS — Z9889 Other specified postprocedural states: Secondary | ICD-10-CM

## 2011-04-24 DIAGNOSIS — G459 Transient cerebral ischemic attack, unspecified: Secondary | ICD-10-CM

## 2011-04-24 DIAGNOSIS — I4891 Unspecified atrial fibrillation: Secondary | ICD-10-CM

## 2011-04-24 DIAGNOSIS — Z7901 Long term (current) use of anticoagulants: Secondary | ICD-10-CM

## 2011-04-24 LAB — POCT INR: INR: 4.6

## 2011-04-24 NOTE — Telephone Encounter (Signed)
LMTCB

## 2011-04-24 NOTE — Telephone Encounter (Signed)
ESSENTIAL HYPERTENSION, BENIGN - [name], MD 04/08/2011 3:07 PM Signed  BP is high, but she says it runs in normal range when she checks at home. I have asked her to check it daily for 2 weeks and record the readings. We will call for numbers in 2 weeks.   04/24/11--I talked with pt's husband-- BP readings since office visit with Dr Shirlee Latch 04/08/11----126/73  136/75 136/83 117/65 and  143/59.  I will forward to Dr Shirlee Latch for review.

## 2011-04-24 NOTE — Telephone Encounter (Signed)
These are acceptable.

## 2011-04-26 NOTE — Telephone Encounter (Signed)
Discussed with husband

## 2011-04-26 NOTE — Telephone Encounter (Signed)
LMTCB

## 2011-04-30 ENCOUNTER — Encounter (INDEPENDENT_AMBULATORY_CARE_PROVIDER_SITE_OTHER): Payer: Medicare PPO | Admitting: *Deleted

## 2011-04-30 ENCOUNTER — Other Ambulatory Visit (INDEPENDENT_AMBULATORY_CARE_PROVIDER_SITE_OTHER): Payer: Medicare PPO | Admitting: *Deleted

## 2011-04-30 DIAGNOSIS — R0989 Other specified symptoms and signs involving the circulatory and respiratory systems: Secondary | ICD-10-CM

## 2011-04-30 DIAGNOSIS — I6529 Occlusion and stenosis of unspecified carotid artery: Secondary | ICD-10-CM

## 2011-04-30 DIAGNOSIS — I059 Rheumatic mitral valve disease, unspecified: Secondary | ICD-10-CM

## 2011-05-08 ENCOUNTER — Encounter: Payer: Medicare PPO | Admitting: Emergency Medicine

## 2011-05-08 ENCOUNTER — Ambulatory Visit (INDEPENDENT_AMBULATORY_CARE_PROVIDER_SITE_OTHER): Payer: Medicare PPO | Admitting: Emergency Medicine

## 2011-05-08 DIAGNOSIS — Z7901 Long term (current) use of anticoagulants: Secondary | ICD-10-CM

## 2011-05-08 DIAGNOSIS — G459 Transient cerebral ischemic attack, unspecified: Secondary | ICD-10-CM

## 2011-05-08 DIAGNOSIS — I4891 Unspecified atrial fibrillation: Secondary | ICD-10-CM

## 2011-05-08 DIAGNOSIS — Z9889 Other specified postprocedural states: Secondary | ICD-10-CM

## 2011-05-08 LAB — POCT INR: INR: 3.6

## 2011-05-16 ENCOUNTER — Telehealth: Payer: Self-pay | Admitting: *Deleted

## 2011-05-16 NOTE — Telephone Encounter (Signed)
[name], MD More Detail >>      [name], MD        Sent: Caleen Essex May 03, 2011 11:29 AM    To: Jacqlyn Krauss, RN        [name] [name]    MRN: [id number] DOB: 26-Jul-1940     Pt Work: [phone] Pt Home: [phone]           Message     Echo showed normal EF, mechanical valve looked ok.  Please let paitent know.    05/16/11--pt's husband notified of echo results

## 2011-05-28 ENCOUNTER — Other Ambulatory Visit: Payer: Self-pay | Admitting: Cardiology

## 2011-05-28 NOTE — Telephone Encounter (Signed)
Medication refill-coumadin 

## 2011-05-29 ENCOUNTER — Ambulatory Visit (INDEPENDENT_AMBULATORY_CARE_PROVIDER_SITE_OTHER): Payer: Medicare PPO | Admitting: Emergency Medicine

## 2011-05-29 DIAGNOSIS — I4891 Unspecified atrial fibrillation: Secondary | ICD-10-CM

## 2011-05-29 DIAGNOSIS — Z9889 Other specified postprocedural states: Secondary | ICD-10-CM

## 2011-05-29 DIAGNOSIS — G459 Transient cerebral ischemic attack, unspecified: Secondary | ICD-10-CM

## 2011-05-29 DIAGNOSIS — Z7901 Long term (current) use of anticoagulants: Secondary | ICD-10-CM

## 2011-05-29 LAB — POCT INR: INR: 3.3

## 2011-06-20 ENCOUNTER — Other Ambulatory Visit: Payer: Medicare PPO | Admitting: *Deleted

## 2011-06-25 ENCOUNTER — Other Ambulatory Visit: Payer: Self-pay | Admitting: Cardiology

## 2011-06-26 ENCOUNTER — Ambulatory Visit (INDEPENDENT_AMBULATORY_CARE_PROVIDER_SITE_OTHER): Payer: Medicare PPO | Admitting: Emergency Medicine

## 2011-06-26 DIAGNOSIS — G459 Transient cerebral ischemic attack, unspecified: Secondary | ICD-10-CM

## 2011-06-26 DIAGNOSIS — Z7901 Long term (current) use of anticoagulants: Secondary | ICD-10-CM

## 2011-06-26 DIAGNOSIS — Z9889 Other specified postprocedural states: Secondary | ICD-10-CM

## 2011-06-26 DIAGNOSIS — I4891 Unspecified atrial fibrillation: Secondary | ICD-10-CM

## 2011-06-26 LAB — POCT INR: INR: 2.8

## 2011-07-24 ENCOUNTER — Ambulatory Visit (INDEPENDENT_AMBULATORY_CARE_PROVIDER_SITE_OTHER): Payer: Medicare PPO

## 2011-07-24 DIAGNOSIS — G459 Transient cerebral ischemic attack, unspecified: Secondary | ICD-10-CM

## 2011-07-24 DIAGNOSIS — I4891 Unspecified atrial fibrillation: Secondary | ICD-10-CM

## 2011-07-24 DIAGNOSIS — Z7901 Long term (current) use of anticoagulants: Secondary | ICD-10-CM

## 2011-07-24 DIAGNOSIS — Z9889 Other specified postprocedural states: Secondary | ICD-10-CM

## 2011-07-24 LAB — POCT INR: INR: 3.2

## 2011-08-21 ENCOUNTER — Ambulatory Visit (INDEPENDENT_AMBULATORY_CARE_PROVIDER_SITE_OTHER): Payer: Medicare PPO

## 2011-08-21 DIAGNOSIS — Z7901 Long term (current) use of anticoagulants: Secondary | ICD-10-CM

## 2011-08-21 DIAGNOSIS — Z9889 Other specified postprocedural states: Secondary | ICD-10-CM

## 2011-08-21 DIAGNOSIS — I4891 Unspecified atrial fibrillation: Secondary | ICD-10-CM

## 2011-08-21 DIAGNOSIS — G459 Transient cerebral ischemic attack, unspecified: Secondary | ICD-10-CM

## 2011-08-21 LAB — POCT INR: INR: 3.4

## 2011-08-28 ENCOUNTER — Other Ambulatory Visit: Payer: Self-pay | Admitting: Cardiology

## 2011-09-25 ENCOUNTER — Ambulatory Visit (INDEPENDENT_AMBULATORY_CARE_PROVIDER_SITE_OTHER): Payer: Medicare PPO

## 2011-09-25 DIAGNOSIS — G459 Transient cerebral ischemic attack, unspecified: Secondary | ICD-10-CM

## 2011-09-25 DIAGNOSIS — Z9889 Other specified postprocedural states: Secondary | ICD-10-CM

## 2011-09-25 DIAGNOSIS — Z7901 Long term (current) use of anticoagulants: Secondary | ICD-10-CM

## 2011-09-25 DIAGNOSIS — I4891 Unspecified atrial fibrillation: Secondary | ICD-10-CM

## 2011-10-31 ENCOUNTER — Other Ambulatory Visit: Payer: Self-pay | Admitting: Cardiology

## 2011-10-31 MED ORDER — WARFARIN SODIUM 5 MG PO TABS: ORAL_TABLET | ORAL | Status: DC

## 2011-11-06 ENCOUNTER — Ambulatory Visit (INDEPENDENT_AMBULATORY_CARE_PROVIDER_SITE_OTHER): Payer: Medicare PPO

## 2011-11-06 DIAGNOSIS — Z7901 Long term (current) use of anticoagulants: Secondary | ICD-10-CM

## 2011-11-06 DIAGNOSIS — Z9889 Other specified postprocedural states: Secondary | ICD-10-CM

## 2011-11-06 DIAGNOSIS — I4891 Unspecified atrial fibrillation: Secondary | ICD-10-CM

## 2011-11-06 DIAGNOSIS — G459 Transient cerebral ischemic attack, unspecified: Secondary | ICD-10-CM

## 2011-12-18 ENCOUNTER — Ambulatory Visit (INDEPENDENT_AMBULATORY_CARE_PROVIDER_SITE_OTHER): Payer: Medicare PPO

## 2011-12-18 DIAGNOSIS — G459 Transient cerebral ischemic attack, unspecified: Secondary | ICD-10-CM

## 2011-12-18 DIAGNOSIS — Z7901 Long term (current) use of anticoagulants: Secondary | ICD-10-CM

## 2011-12-18 DIAGNOSIS — I4891 Unspecified atrial fibrillation: Secondary | ICD-10-CM

## 2011-12-18 DIAGNOSIS — Z9889 Other specified postprocedural states: Secondary | ICD-10-CM

## 2012-01-01 ENCOUNTER — Other Ambulatory Visit: Payer: Self-pay

## 2012-01-01 MED ORDER — METOPROLOL TARTRATE 50 MG PO TABS: 50.0000 mg | ORAL_TABLET | Freq: Two times a day (BID) | ORAL | Status: DC

## 2012-01-01 NOTE — Telephone Encounter (Signed)
..   Requested Prescriptions   Signed Prescriptions Disp Refills  . metoprolol (LOPRESSOR) 50 MG tablet 60 tablet 5    Sig: Take 1 tablet (50 mg total) by mouth 2 (two) times daily.    Authorizing Provider: Laurey Morale    Ordering User: Christella Hartigan, Dmarcus Decicco Judie Petit

## 2012-01-22 ENCOUNTER — Ambulatory Visit (INDEPENDENT_AMBULATORY_CARE_PROVIDER_SITE_OTHER): Payer: Medicare PPO

## 2012-01-22 DIAGNOSIS — G459 Transient cerebral ischemic attack, unspecified: Secondary | ICD-10-CM

## 2012-01-22 DIAGNOSIS — Z7901 Long term (current) use of anticoagulants: Secondary | ICD-10-CM

## 2012-01-22 DIAGNOSIS — Z9889 Other specified postprocedural states: Secondary | ICD-10-CM

## 2012-01-22 DIAGNOSIS — I4891 Unspecified atrial fibrillation: Secondary | ICD-10-CM

## 2012-01-22 LAB — POCT INR: INR: 5.2

## 2012-02-05 ENCOUNTER — Ambulatory Visit (INDEPENDENT_AMBULATORY_CARE_PROVIDER_SITE_OTHER): Payer: Medicare PPO

## 2012-02-05 DIAGNOSIS — I4891 Unspecified atrial fibrillation: Secondary | ICD-10-CM

## 2012-02-05 DIAGNOSIS — Z7901 Long term (current) use of anticoagulants: Secondary | ICD-10-CM

## 2012-02-05 DIAGNOSIS — Z9889 Other specified postprocedural states: Secondary | ICD-10-CM

## 2012-02-05 DIAGNOSIS — G459 Transient cerebral ischemic attack, unspecified: Secondary | ICD-10-CM

## 2012-02-19 ENCOUNTER — Ambulatory Visit (INDEPENDENT_AMBULATORY_CARE_PROVIDER_SITE_OTHER): Payer: Medicare PPO

## 2012-02-19 DIAGNOSIS — G459 Transient cerebral ischemic attack, unspecified: Secondary | ICD-10-CM

## 2012-02-19 DIAGNOSIS — Z9889 Other specified postprocedural states: Secondary | ICD-10-CM

## 2012-02-19 DIAGNOSIS — Z7901 Long term (current) use of anticoagulants: Secondary | ICD-10-CM

## 2012-02-19 DIAGNOSIS — I4891 Unspecified atrial fibrillation: Secondary | ICD-10-CM

## 2012-03-11 ENCOUNTER — Ambulatory Visit (INDEPENDENT_AMBULATORY_CARE_PROVIDER_SITE_OTHER): Payer: Medicare PPO

## 2012-03-11 DIAGNOSIS — Z7901 Long term (current) use of anticoagulants: Secondary | ICD-10-CM

## 2012-03-11 DIAGNOSIS — Z9889 Other specified postprocedural states: Secondary | ICD-10-CM

## 2012-03-11 DIAGNOSIS — I4891 Unspecified atrial fibrillation: Secondary | ICD-10-CM

## 2012-03-11 DIAGNOSIS — G459 Transient cerebral ischemic attack, unspecified: Secondary | ICD-10-CM

## 2012-03-11 LAB — POCT INR: INR: 3.2

## 2012-03-25 ENCOUNTER — Other Ambulatory Visit: Payer: Self-pay | Admitting: *Deleted

## 2012-03-25 MED ORDER — ATORVASTATIN CALCIUM 20 MG PO TABS: 20.0000 mg | ORAL_TABLET | Freq: Every day | ORAL | Status: DC

## 2012-03-26 ENCOUNTER — Other Ambulatory Visit: Payer: Self-pay

## 2012-03-26 MED ORDER — WARFARIN SODIUM 5 MG PO TABS: ORAL_TABLET | ORAL | Status: DC

## 2012-04-08 ENCOUNTER — Ambulatory Visit (INDEPENDENT_AMBULATORY_CARE_PROVIDER_SITE_OTHER): Payer: Medicare PPO

## 2012-04-08 DIAGNOSIS — I4891 Unspecified atrial fibrillation: Secondary | ICD-10-CM

## 2012-04-08 DIAGNOSIS — Z9889 Other specified postprocedural states: Secondary | ICD-10-CM

## 2012-04-08 DIAGNOSIS — G459 Transient cerebral ischemic attack, unspecified: Secondary | ICD-10-CM

## 2012-04-08 DIAGNOSIS — Z7901 Long term (current) use of anticoagulants: Secondary | ICD-10-CM

## 2012-05-14 ENCOUNTER — Ambulatory Visit (INDEPENDENT_AMBULATORY_CARE_PROVIDER_SITE_OTHER): Payer: Medicare PPO

## 2012-05-14 DIAGNOSIS — G459 Transient cerebral ischemic attack, unspecified: Secondary | ICD-10-CM

## 2012-05-14 DIAGNOSIS — Z7901 Long term (current) use of anticoagulants: Secondary | ICD-10-CM

## 2012-05-14 DIAGNOSIS — I4891 Unspecified atrial fibrillation: Secondary | ICD-10-CM

## 2012-05-14 DIAGNOSIS — Z9889 Other specified postprocedural states: Secondary | ICD-10-CM

## 2012-06-17 ENCOUNTER — Ambulatory Visit (INDEPENDENT_AMBULATORY_CARE_PROVIDER_SITE_OTHER): Payer: Medicare PPO | Admitting: *Deleted

## 2012-06-17 DIAGNOSIS — Z7901 Long term (current) use of anticoagulants: Secondary | ICD-10-CM

## 2012-06-17 DIAGNOSIS — Z9889 Other specified postprocedural states: Secondary | ICD-10-CM

## 2012-06-17 DIAGNOSIS — I4891 Unspecified atrial fibrillation: Secondary | ICD-10-CM

## 2012-06-17 DIAGNOSIS — G459 Transient cerebral ischemic attack, unspecified: Secondary | ICD-10-CM

## 2012-06-24 ENCOUNTER — Other Ambulatory Visit: Payer: Self-pay | Admitting: *Deleted

## 2012-06-24 MED ORDER — METOPROLOL TARTRATE 50 MG PO TABS: 50.0000 mg | ORAL_TABLET | Freq: Two times a day (BID) | ORAL | Status: DC

## 2012-07-15 ENCOUNTER — Ambulatory Visit (INDEPENDENT_AMBULATORY_CARE_PROVIDER_SITE_OTHER): Payer: Medicare PPO

## 2012-07-15 DIAGNOSIS — Z9889 Other specified postprocedural states: Secondary | ICD-10-CM

## 2012-07-15 DIAGNOSIS — G459 Transient cerebral ischemic attack, unspecified: Secondary | ICD-10-CM

## 2012-07-15 DIAGNOSIS — I4891 Unspecified atrial fibrillation: Secondary | ICD-10-CM

## 2012-07-15 DIAGNOSIS — Z7901 Long term (current) use of anticoagulants: Secondary | ICD-10-CM

## 2012-07-15 LAB — POCT INR: INR: 3.1

## 2012-07-24 ENCOUNTER — Other Ambulatory Visit: Payer: Self-pay | Admitting: *Deleted

## 2012-07-24 MED ORDER — METOPROLOL TARTRATE 50 MG PO TABS: 50.0000 mg | ORAL_TABLET | Freq: Two times a day (BID) | ORAL | Status: DC

## 2012-08-12 ENCOUNTER — Ambulatory Visit (INDEPENDENT_AMBULATORY_CARE_PROVIDER_SITE_OTHER): Payer: Medicare PPO

## 2012-08-12 DIAGNOSIS — I4891 Unspecified atrial fibrillation: Secondary | ICD-10-CM

## 2012-08-12 DIAGNOSIS — Z7901 Long term (current) use of anticoagulants: Secondary | ICD-10-CM

## 2012-08-12 DIAGNOSIS — Z9889 Other specified postprocedural states: Secondary | ICD-10-CM

## 2012-08-12 DIAGNOSIS — G459 Transient cerebral ischemic attack, unspecified: Secondary | ICD-10-CM

## 2012-08-12 LAB — POCT INR: INR: 3.4

## 2012-08-25 ENCOUNTER — Other Ambulatory Visit: Payer: Self-pay | Admitting: *Deleted

## 2012-08-25 MED ORDER — METOPROLOL TARTRATE 50 MG PO TABS: 50.0000 mg | ORAL_TABLET | Freq: Two times a day (BID) | ORAL | Status: DC

## 2012-09-16 ENCOUNTER — Ambulatory Visit (INDEPENDENT_AMBULATORY_CARE_PROVIDER_SITE_OTHER): Payer: Medicare PPO

## 2012-09-16 DIAGNOSIS — Z7901 Long term (current) use of anticoagulants: Secondary | ICD-10-CM

## 2012-09-16 DIAGNOSIS — I4891 Unspecified atrial fibrillation: Secondary | ICD-10-CM

## 2012-09-16 DIAGNOSIS — G459 Transient cerebral ischemic attack, unspecified: Secondary | ICD-10-CM

## 2012-09-16 DIAGNOSIS — Z9889 Other specified postprocedural states: Secondary | ICD-10-CM

## 2012-09-16 LAB — POCT INR: INR: 3.4

## 2012-09-22 ENCOUNTER — Other Ambulatory Visit: Payer: Self-pay | Admitting: *Deleted

## 2012-09-22 MED ORDER — METOPROLOL TARTRATE 50 MG PO TABS: 50.0000 mg | ORAL_TABLET | Freq: Two times a day (BID) | ORAL | Status: DC

## 2012-10-22 ENCOUNTER — Other Ambulatory Visit: Payer: Self-pay | Admitting: Pharmacist

## 2012-10-22 ENCOUNTER — Other Ambulatory Visit: Payer: Self-pay | Admitting: *Deleted

## 2012-10-22 MED ORDER — WARFARIN SODIUM 5 MG PO TABS: ORAL_TABLET | ORAL | Status: DC

## 2012-10-28 ENCOUNTER — Ambulatory Visit (INDEPENDENT_AMBULATORY_CARE_PROVIDER_SITE_OTHER): Payer: Medicare PPO

## 2012-10-28 DIAGNOSIS — Z9889 Other specified postprocedural states: Secondary | ICD-10-CM

## 2012-10-28 DIAGNOSIS — I4891 Unspecified atrial fibrillation: Secondary | ICD-10-CM

## 2012-10-28 DIAGNOSIS — Z7901 Long term (current) use of anticoagulants: Secondary | ICD-10-CM

## 2012-10-28 DIAGNOSIS — G459 Transient cerebral ischemic attack, unspecified: Secondary | ICD-10-CM

## 2012-10-28 LAB — POCT INR: INR: 3.3

## 2012-11-02 ENCOUNTER — Other Ambulatory Visit: Payer: Self-pay | Admitting: *Deleted

## 2012-11-02 MED ORDER — METOPROLOL TARTRATE 50 MG PO TABS: 50.0000 mg | ORAL_TABLET | Freq: Two times a day (BID) | ORAL | Status: DC

## 2012-11-17 ENCOUNTER — Other Ambulatory Visit: Payer: Self-pay | Admitting: *Deleted

## 2012-11-17 MED ORDER — METOPROLOL TARTRATE 50 MG PO TABS: 50.0000 mg | ORAL_TABLET | Freq: Two times a day (BID) | ORAL | Status: DC

## 2012-11-17 NOTE — Telephone Encounter (Signed)
Talked with patient's husband about Ms Fabel needing to schedule an office appointment before further refills can be granted. She has not been seen since 2012. Husband was adviced to schedule appointment and then I will able to refill medication up to appointment date. There after we can refill medication accordingly. He agreed and will call back to schedule her appointment.   Micki Riley, CMA

## 2012-11-23 ENCOUNTER — Other Ambulatory Visit: Payer: Self-pay | Admitting: *Deleted

## 2012-11-23 MED ORDER — ATORVASTATIN CALCIUM 20 MG PO TABS: 20.0000 mg | ORAL_TABLET | Freq: Every day | ORAL | Status: DC

## 2012-12-04 ENCOUNTER — Ambulatory Visit: Payer: Medicare PPO | Admitting: Physician Assistant

## 2012-12-07 ENCOUNTER — Encounter: Payer: Self-pay | Admitting: Physician Assistant

## 2012-12-07 ENCOUNTER — Ambulatory Visit (INDEPENDENT_AMBULATORY_CARE_PROVIDER_SITE_OTHER): Payer: Medicare PPO | Admitting: Physician Assistant

## 2012-12-07 VITALS — BP 150/70 | HR 76 | Wt 137.0 lb

## 2012-12-07 DIAGNOSIS — I6529 Occlusion and stenosis of unspecified carotid artery: Secondary | ICD-10-CM

## 2012-12-07 DIAGNOSIS — F17200 Nicotine dependence, unspecified, uncomplicated: Secondary | ICD-10-CM

## 2012-12-07 DIAGNOSIS — E785 Hyperlipidemia, unspecified: Secondary | ICD-10-CM

## 2012-12-07 DIAGNOSIS — I059 Rheumatic mitral valve disease, unspecified: Secondary | ICD-10-CM

## 2012-12-07 DIAGNOSIS — I10 Essential (primary) hypertension: Secondary | ICD-10-CM

## 2012-12-07 DIAGNOSIS — I4891 Unspecified atrial fibrillation: Secondary | ICD-10-CM

## 2012-12-07 MED ORDER — METOPROLOL TARTRATE 50 MG PO TABS: 75.0000 mg | ORAL_TABLET | Freq: Two times a day (BID) | ORAL | Status: DC

## 2012-12-07 MED ORDER — ATORVASTATIN CALCIUM 20 MG PO TABS: 20.0000 mg | ORAL_TABLET | Freq: Every day | ORAL | Status: DC

## 2012-12-07 NOTE — Progress Notes (Signed)
[address] [place], [place]  [address] Phone: [phone] Fax:  [phone]  Date:  12/07/2012   ID:  [name] [name], DOB Jan 07, 1941, MRN [id number]  PCP:  Lorie Phenix, MD  Cardiologist:  Dr. [name]     History of Present Illness: [name] [name] is a 72 y.o. female who returns for follow up.  She has a hx of mitral regurgitation s/p MV replacement with Bjork-Shiley tilting disc valve in 1980 as well as paroxysmal atrial fibrillation and history of TIA. Patient was previously followed by Dr. Juanda Chance.  Last seen by Dr. [name] in 03/2011.  Follow up echo demonstrated stable mechanical mitral valve prosthesis.  Carotid US demonstrated 60-79% RICA stenosis.  Follow up US was due in 04/2012.  LDL was very high on labs and Atorvastatin was started.    The patient denies chest pain, shortness of breath, syncope, orthopnea, PND or significant pedal edema.   Labs (11/12):  K 4.7, Cr 0.7, LDL 226, Hgb 14.3    Wt Readings from Last 3 Encounters:  12/07/12 137 lb (62.143 kg)  04/08/11 157 lb 12.8 oz (71.578 kg)  11/07/09 158 lb (71.668 kg)    Past Medical History: 1. Mitral regurgitation s/p mitral valve replacement in 1980 with Bjork-Shiley tilting disc mechanical mitral valve. Echo (4/09) with EF 55%, well-seated mechanical mitral valve with mean gradient 6 mmHg across the valve.  Echo 04/2011: Mild LVH, EF 55-65%, mechanical MVR, mean gradient 4, mildly elevated PASP 2. Atrial flutter s/p atrial flutter ablation.  3. Paroxysmal atrial fibrillation.  4. TIA  5. Dementia: Mild.  6. Active smoker  7. GERD  8. HTN  9. Depression 10. CAD: Nonobstructive by [facility] 11/07 (OM1 40%, proximal RCA 40%, EF 60%) 11. Carotid stenosis: Carotid US (12/12) RICA 60-79%, LICA 0-39% (followup 1 year)  Current Outpatient Prescriptions  Medication Sig Dispense Refill  . Ascorbic Acid (VITAMIN C) 500 MG tablet Take 500 mg by mouth daily.        [name] aspirin EC 81 MG  EC tablet Take 81 mg by mouth daily.        [name] atorvastatin (LIPITOR) 20 MG tablet Take 1 tablet (20 mg total) by mouth daily.  30 tablet  0  . citalopram (CELEXA) 40 MG tablet Take 40 mg by mouth daily.        . Cyanocobalamin (VITAMIN B 12) 250 MCG LOZG Take 500 mg by mouth daily.        . metoprolol (LOPRESSOR) 50 MG tablet Take 1.5 tablets (75 mg total) by mouth 2 (two) times daily.  90 tablet  11  . Multiple Vitamins-Calcium (EQL ONE DAILY WOMENS PO) Take 1 tablet by mouth daily.        . temazepam (RESTORIL) 30 MG capsule Take 30 mg by mouth at bedtime.        [name] warfarin (COUMADIN) 5 MG tablet Take as directed by the Anticoagulation clinic.  45 tablet  3   No current facility-administered medications for this visit.    Allergies:    Allergies  Allergen Reactions  . Codeine     Social History:  The patient  reports that she has been smoking.  She does not have any smokeless tobacco history on file. She reports that she does not drink alcohol.   ROS:  Please see the history of present illness.      All other systems reviewed and negative.   PHYSICAL EXAM: VS:  BP 150/70  Pulse 76  Wt 137 lb (62.143 kg)  BMI 21.45 kg/m2 Well nourished, well developed, in no acute distress HEENT: normal Neck: no JVD Vascular: + bilateral carotid bruits Cardiac:  mechanical S1, normal S2; RRR; 1/6 systolic murmur along LSB Lungs:  clear to auscultation bilaterally, no wheezing, rhonchi or rales Abd: soft, nontender, no hepatomegaly Ext: no edema Skin: warm and dry Neuro:  CNs 2-12 intact, no focal abnormalities noted  EKG:  NSR, HR 76, normal axis, NSSTTW changes, no change from prior tracing     ASSESSMENT AND PLAN:  1. Mitral Regurgitation, s/p Bjork-Shiley MVR:  Continue coumadin with goal INR 3-3.5 (hx of TIA).  Last echo with stable mechanical MV prosthesis.  Continue SBE prophylaxis.  2. Carotid Stenosis:  Arrange f/u dopplers.   3. Hyperlipidemia:  Arrange follow up Lipids and LFTs.    4. Paroxysmal Atrial Fibrillation:  Maintaining NSR.  Remains on Coumadin.  She needs bridging Lovenox or Heparin if she ever needs to come off heparin. 5. CAD:  No angina.  Continue ASA  And statin.  6. Hypertension:  Elevated BP.  Increase Metoprolol to 75 mg bid.  Check BP with RN at INR check next month.  7. Tobacco Abuse:  We discussed the importance of cessation and different strategies for quitting.    8. Disposition:  F/u with Dr. [name] in 1 year.   Signed, Tereso Newcomer, PA-C  12/07/2012 3:06 PM

## 2012-12-07 NOTE — Patient Instructions (Addendum)
BP CHECK IN 12/2012 THE [place] OFFICE WHEN YOU GO FOR 12/2012 COUMADIN VISIT, MAKE SURE THEY SEND RESULTS TO SCOTT [facility]AVER, PAC  FASTING LIPID AND LIVER PANEL TO BE DONE ON 12/09/12 IN [place] WITH YOUR NEXT COUMADIN VISIT   PLEASE SCHEDULE TO HAVE CAROTID DOPPLERS DONE IN THE [place] OFFICE DX 433.10   INCREASE METOPROLOL TO 75 MG TWICE DAILY (1 AND 1/2 TABS TWICE DAILY) REFILL SENT IN TODAY  Your physician wants you to follow-up in: 12 MONTH WITH DR. Shirlee Latch. You will receive a reminder letter in the mail two months in advance. If you don't receive a letter, please call our office to schedule the follow-up appointment.

## 2012-12-09 ENCOUNTER — Ambulatory Visit (INDEPENDENT_AMBULATORY_CARE_PROVIDER_SITE_OTHER): Payer: Medicare PPO | Admitting: *Deleted

## 2012-12-09 ENCOUNTER — Ambulatory Visit (INDEPENDENT_AMBULATORY_CARE_PROVIDER_SITE_OTHER): Payer: Medicare PPO

## 2012-12-09 DIAGNOSIS — Z9889 Other specified postprocedural states: Secondary | ICD-10-CM

## 2012-12-09 DIAGNOSIS — E785 Hyperlipidemia, unspecified: Secondary | ICD-10-CM

## 2012-12-09 DIAGNOSIS — G459 Transient cerebral ischemic attack, unspecified: Secondary | ICD-10-CM

## 2012-12-09 DIAGNOSIS — Z7901 Long term (current) use of anticoagulants: Secondary | ICD-10-CM

## 2012-12-09 DIAGNOSIS — I4891 Unspecified atrial fibrillation: Secondary | ICD-10-CM

## 2012-12-09 LAB — POCT INR: INR: 4.1

## 2012-12-10 LAB — HEPATIC FUNCTION PANEL
ALT: 15 IU/L (ref 0–32)
AST: 14 IU/L (ref 0–40)
Albumin: 3.9 g/dL (ref 3.5–4.8)
Alkaline Phosphatase: 128 IU/L — ABNORMAL HIGH (ref 39–117)
Total Bilirubin: 0.5 mg/dL (ref 0.0–1.2)

## 2012-12-10 LAB — LIPID PANEL
Cholesterol, Total: 175 mg/dL (ref 100–199)
Triglycerides: 77 mg/dL (ref 0–149)

## 2012-12-11 ENCOUNTER — Telehealth: Payer: Self-pay | Admitting: *Deleted

## 2012-12-11 NOTE — Telephone Encounter (Signed)
Message copied by Tarri Fuller on Fri Dec 11, 2012  6:03 PM ------      Message from: [place], [place] T      Created: Thu Dec 10, 2012  5:19 PM       Lipids okay      Continue with current treatment plan.      Tereso Newcomer, PA-C        12/10/2012 5:19 PM ------

## 2012-12-11 NOTE — Telephone Encounter (Signed)
s/w pt's husband who has been notified about lab results with verbal understanding

## 2013-01-06 ENCOUNTER — Telehealth: Payer: Self-pay

## 2013-01-06 ENCOUNTER — Ambulatory Visit (INDEPENDENT_AMBULATORY_CARE_PROVIDER_SITE_OTHER): Payer: Medicare PPO

## 2013-01-06 DIAGNOSIS — Z9889 Other specified postprocedural states: Secondary | ICD-10-CM

## 2013-01-06 DIAGNOSIS — Z7901 Long term (current) use of anticoagulants: Secondary | ICD-10-CM

## 2013-01-06 DIAGNOSIS — I10 Essential (primary) hypertension: Secondary | ICD-10-CM

## 2013-01-06 DIAGNOSIS — I4891 Unspecified atrial fibrillation: Secondary | ICD-10-CM

## 2013-01-06 DIAGNOSIS — G459 Transient cerebral ischemic attack, unspecified: Secondary | ICD-10-CM

## 2013-01-06 DIAGNOSIS — Z79899 Other long term (current) drug therapy: Secondary | ICD-10-CM

## 2013-01-06 MED ORDER — LISINOPRIL 10 MG PO TABS: 10.0000 mg | ORAL_TABLET | Freq: Every day | ORAL | Status: DC

## 2013-01-06 NOTE — Telephone Encounter (Signed)
Pt was seen in [facility] today, BP checked per Tereso Newcomer, PA request.  Pt's Metoprolol was increased from 50mg  BID to 75mg  BID at last OV on 12/07/12.  Pt's BP today in office is 156/74.  Will forward to [facility] for review and further medication management.

## 2013-01-06 NOTE — Addendum Note (Signed)
Addended by: Migdalia Dk on: 01/06/2013 03:42 PM   Modules accepted: Orders

## 2013-01-06 NOTE — Telephone Encounter (Signed)
BP still elevated. Start Lisinopril 10 mg QD. Check BMET 1-2 weeks after starting with BP check. Tereso Newcomer, PA-C   01/06/2013 1:25 PM

## 2013-01-06 NOTE — Telephone Encounter (Signed)
Called spoke with pt's husband aware BP still elevated per Tereso Newcomer, PA.  Advised to start taking Lisinopril 10mg  QD in addition to continuing on the Metoprolol 75mg  BID.  Pt has carotid US on 01/20/13 will schedule BP check and lab same day.  Pt's husband aware of appts and plan. Verbalized understanding.

## 2013-01-20 ENCOUNTER — Ambulatory Visit: Payer: Medicare PPO

## 2013-01-20 ENCOUNTER — Encounter (INDEPENDENT_AMBULATORY_CARE_PROVIDER_SITE_OTHER): Payer: Medicare PPO

## 2013-01-20 ENCOUNTER — Ambulatory Visit (INDEPENDENT_AMBULATORY_CARE_PROVIDER_SITE_OTHER): Payer: Medicare PPO | Admitting: *Deleted

## 2013-01-20 VITALS — BP 158/72 | HR 71 | Ht 66.0 in | Wt 138.0 lb

## 2013-01-20 DIAGNOSIS — I10 Essential (primary) hypertension: Secondary | ICD-10-CM

## 2013-01-20 DIAGNOSIS — I6529 Occlusion and stenosis of unspecified carotid artery: Secondary | ICD-10-CM

## 2013-01-20 DIAGNOSIS — R42 Dizziness and giddiness: Secondary | ICD-10-CM

## 2013-01-20 DIAGNOSIS — Z79899 Other long term (current) drug therapy: Secondary | ICD-10-CM

## 2013-01-20 NOTE — Progress Notes (Signed)
Pt came in for BP check in the office today while here for an ECHO. BP today was 158/72, no headaches or dizziness. Pt states that she started Lipitor 2 weeks ago. Pt states that she does not check her BP at home. Instructed her to purchase an at home BP machine and check her blood pressure at home once or twice a day, at the same time of day. Pt to call the office in one week to report her blood pressure readings.

## 2013-01-21 ENCOUNTER — Encounter: Payer: Self-pay | Admitting: Physician Assistant

## 2013-01-21 LAB — BASIC METABOLIC PANEL
BUN/Creatinine Ratio: 11 (ref 11–26)
BUN: 7 mg/dL — ABNORMAL LOW (ref 8–27)
CO2: 24 mmol/L (ref 18–29)
Chloride: 98 mmol/L (ref 97–108)
Creatinine, Ser: 0.66 mg/dL (ref 0.57–1.00)
GFR calc Af Amer: 102 mL/min/{1.73_m2} (ref >59)
Glucose: 93 mg/dL (ref 65–99)
Potassium: 5.1 mmol/L (ref 3.5–5.2)
Sodium: 135 mmol/L (ref 134–144)

## 2013-01-22 ENCOUNTER — Telehealth: Payer: Self-pay | Admitting: *Deleted

## 2013-01-22 NOTE — Telephone Encounter (Signed)
lmptcb go over test results

## 2013-01-22 NOTE — Telephone Encounter (Signed)
pt and her husband have both been notified about carotid dopplers and lab results with verbal understanding to all results, pt to stay on current regimen

## 2013-01-22 NOTE — Telephone Encounter (Signed)
Returning nurse call.

## 2013-01-27 ENCOUNTER — Ambulatory Visit (INDEPENDENT_AMBULATORY_CARE_PROVIDER_SITE_OTHER): Payer: Medicare PPO

## 2013-01-27 DIAGNOSIS — G459 Transient cerebral ischemic attack, unspecified: Secondary | ICD-10-CM

## 2013-01-27 DIAGNOSIS — I4891 Unspecified atrial fibrillation: Secondary | ICD-10-CM

## 2013-01-27 DIAGNOSIS — Z9889 Other specified postprocedural states: Secondary | ICD-10-CM

## 2013-01-27 DIAGNOSIS — Z7901 Long term (current) use of anticoagulants: Secondary | ICD-10-CM

## 2013-01-27 LAB — POCT INR: INR: 3.1

## 2013-02-24 ENCOUNTER — Ambulatory Visit (INDEPENDENT_AMBULATORY_CARE_PROVIDER_SITE_OTHER): Payer: Medicare PPO | Admitting: General Practice

## 2013-02-24 DIAGNOSIS — Z9889 Other specified postprocedural states: Secondary | ICD-10-CM

## 2013-02-24 DIAGNOSIS — I4891 Unspecified atrial fibrillation: Secondary | ICD-10-CM

## 2013-02-24 DIAGNOSIS — G459 Transient cerebral ischemic attack, unspecified: Secondary | ICD-10-CM

## 2013-02-24 DIAGNOSIS — Z7901 Long term (current) use of anticoagulants: Secondary | ICD-10-CM

## 2013-02-24 LAB — POCT INR: INR: 3.9

## 2013-03-24 ENCOUNTER — Ambulatory Visit (INDEPENDENT_AMBULATORY_CARE_PROVIDER_SITE_OTHER): Payer: Medicare PPO | Admitting: *Deleted

## 2013-03-24 DIAGNOSIS — Z7901 Long term (current) use of anticoagulants: Secondary | ICD-10-CM

## 2013-03-24 DIAGNOSIS — G459 Transient cerebral ischemic attack, unspecified: Secondary | ICD-10-CM

## 2013-03-24 DIAGNOSIS — Z9889 Other specified postprocedural states: Secondary | ICD-10-CM

## 2013-03-24 DIAGNOSIS — I4891 Unspecified atrial fibrillation: Secondary | ICD-10-CM

## 2013-04-28 ENCOUNTER — Ambulatory Visit (INDEPENDENT_AMBULATORY_CARE_PROVIDER_SITE_OTHER): Payer: Medicare PPO

## 2013-04-28 DIAGNOSIS — G459 Transient cerebral ischemic attack, unspecified: Secondary | ICD-10-CM

## 2013-04-28 DIAGNOSIS — Z9889 Other specified postprocedural states: Secondary | ICD-10-CM

## 2013-04-28 DIAGNOSIS — Z7901 Long term (current) use of anticoagulants: Secondary | ICD-10-CM

## 2013-04-28 DIAGNOSIS — I4891 Unspecified atrial fibrillation: Secondary | ICD-10-CM

## 2013-05-10 ENCOUNTER — Other Ambulatory Visit: Payer: Self-pay | Admitting: *Deleted

## 2013-05-10 MED ORDER — WARFARIN SODIUM 5 MG PO TABS: ORAL_TABLET | ORAL | Status: DC

## 2013-06-09 ENCOUNTER — Ambulatory Visit (INDEPENDENT_AMBULATORY_CARE_PROVIDER_SITE_OTHER): Payer: Medicare PPO

## 2013-06-09 DIAGNOSIS — G459 Transient cerebral ischemic attack, unspecified: Secondary | ICD-10-CM

## 2013-06-09 DIAGNOSIS — Z7901 Long term (current) use of anticoagulants: Secondary | ICD-10-CM

## 2013-06-09 DIAGNOSIS — Z5181 Encounter for therapeutic drug level monitoring: Secondary | ICD-10-CM

## 2013-06-09 DIAGNOSIS — Z9889 Other specified postprocedural states: Secondary | ICD-10-CM

## 2013-06-09 DIAGNOSIS — I4891 Unspecified atrial fibrillation: Secondary | ICD-10-CM

## 2013-06-09 LAB — POCT INR: INR: 3.3

## 2013-07-21 ENCOUNTER — Ambulatory Visit (INDEPENDENT_AMBULATORY_CARE_PROVIDER_SITE_OTHER): Payer: Medicare PPO

## 2013-07-21 DIAGNOSIS — Z9889 Other specified postprocedural states: Secondary | ICD-10-CM

## 2013-07-21 DIAGNOSIS — I4891 Unspecified atrial fibrillation: Secondary | ICD-10-CM

## 2013-07-21 DIAGNOSIS — G459 Transient cerebral ischemic attack, unspecified: Secondary | ICD-10-CM

## 2013-07-21 DIAGNOSIS — Z7901 Long term (current) use of anticoagulants: Secondary | ICD-10-CM

## 2013-07-21 DIAGNOSIS — Z5181 Encounter for therapeutic drug level monitoring: Secondary | ICD-10-CM

## 2013-07-21 LAB — POCT INR: INR: 3.8

## 2013-08-04 ENCOUNTER — Other Ambulatory Visit: Payer: Self-pay

## 2013-08-04 MED ORDER — LISINOPRIL 10 MG PO TABS: 10.0000 mg | ORAL_TABLET | Freq: Every day | ORAL | Status: DC

## 2013-09-01 ENCOUNTER — Ambulatory Visit (INDEPENDENT_AMBULATORY_CARE_PROVIDER_SITE_OTHER): Payer: Medicare PPO

## 2013-09-01 DIAGNOSIS — I4891 Unspecified atrial fibrillation: Secondary | ICD-10-CM

## 2013-09-01 DIAGNOSIS — G459 Transient cerebral ischemic attack, unspecified: Secondary | ICD-10-CM

## 2013-09-01 DIAGNOSIS — Z7901 Long term (current) use of anticoagulants: Secondary | ICD-10-CM

## 2013-09-01 DIAGNOSIS — Z9889 Other specified postprocedural states: Secondary | ICD-10-CM

## 2013-09-01 DIAGNOSIS — Z5181 Encounter for therapeutic drug level monitoring: Secondary | ICD-10-CM

## 2013-09-01 LAB — POCT INR: INR: 3.2

## 2013-10-13 ENCOUNTER — Ambulatory Visit (INDEPENDENT_AMBULATORY_CARE_PROVIDER_SITE_OTHER): Payer: Medicare PPO

## 2013-10-13 DIAGNOSIS — Z7901 Long term (current) use of anticoagulants: Secondary | ICD-10-CM

## 2013-10-13 DIAGNOSIS — I4891 Unspecified atrial fibrillation: Secondary | ICD-10-CM

## 2013-10-13 DIAGNOSIS — Z5181 Encounter for therapeutic drug level monitoring: Secondary | ICD-10-CM

## 2013-10-13 DIAGNOSIS — Z9889 Other specified postprocedural states: Secondary | ICD-10-CM

## 2013-10-13 DIAGNOSIS — G459 Transient cerebral ischemic attack, unspecified: Secondary | ICD-10-CM

## 2013-10-13 LAB — POCT INR: INR: 2.8

## 2013-11-03 ENCOUNTER — Ambulatory Visit (INDEPENDENT_AMBULATORY_CARE_PROVIDER_SITE_OTHER): Payer: Medicare PPO

## 2013-11-03 DIAGNOSIS — Z5181 Encounter for therapeutic drug level monitoring: Secondary | ICD-10-CM

## 2013-11-03 DIAGNOSIS — Z7901 Long term (current) use of anticoagulants: Secondary | ICD-10-CM

## 2013-11-03 DIAGNOSIS — Z9889 Other specified postprocedural states: Secondary | ICD-10-CM

## 2013-11-03 DIAGNOSIS — G459 Transient cerebral ischemic attack, unspecified: Secondary | ICD-10-CM

## 2013-11-03 DIAGNOSIS — I4891 Unspecified atrial fibrillation: Secondary | ICD-10-CM

## 2013-11-03 LAB — POCT INR: INR: 3.4

## 2013-11-04 ENCOUNTER — Other Ambulatory Visit: Payer: Self-pay | Admitting: *Deleted

## 2013-11-04 MED ORDER — WARFARIN SODIUM 5 MG PO TABS: ORAL_TABLET | ORAL | Status: DC

## 2013-12-01 ENCOUNTER — Ambulatory Visit (INDEPENDENT_AMBULATORY_CARE_PROVIDER_SITE_OTHER): Payer: Medicare PPO

## 2013-12-01 DIAGNOSIS — Z9889 Other specified postprocedural states: Secondary | ICD-10-CM

## 2013-12-01 DIAGNOSIS — I4891 Unspecified atrial fibrillation: Secondary | ICD-10-CM

## 2013-12-01 DIAGNOSIS — Z5181 Encounter for therapeutic drug level monitoring: Secondary | ICD-10-CM

## 2013-12-01 DIAGNOSIS — G459 Transient cerebral ischemic attack, unspecified: Secondary | ICD-10-CM

## 2013-12-01 DIAGNOSIS — Z7901 Long term (current) use of anticoagulants: Secondary | ICD-10-CM

## 2013-12-01 LAB — POCT INR: INR: 3

## 2013-12-08 ENCOUNTER — Other Ambulatory Visit: Payer: Self-pay

## 2013-12-08 DIAGNOSIS — I10 Essential (primary) hypertension: Secondary | ICD-10-CM

## 2013-12-08 MED ORDER — METOPROLOL TARTRATE 50 MG PO TABS: 75.0000 mg | ORAL_TABLET | Freq: Two times a day (BID) | ORAL | Status: DC

## 2013-12-15 ENCOUNTER — Other Ambulatory Visit: Payer: Self-pay

## 2013-12-15 MED ORDER — ATORVASTATIN CALCIUM 20 MG PO TABS: 20.0000 mg | ORAL_TABLET | Freq: Every day | ORAL | Status: DC

## 2013-12-29 ENCOUNTER — Ambulatory Visit (INDEPENDENT_AMBULATORY_CARE_PROVIDER_SITE_OTHER): Payer: Medicare PPO

## 2013-12-29 DIAGNOSIS — Z5181 Encounter for therapeutic drug level monitoring: Secondary | ICD-10-CM

## 2013-12-29 DIAGNOSIS — G459 Transient cerebral ischemic attack, unspecified: Secondary | ICD-10-CM

## 2013-12-29 DIAGNOSIS — Z9889 Other specified postprocedural states: Secondary | ICD-10-CM

## 2013-12-29 DIAGNOSIS — I4891 Unspecified atrial fibrillation: Secondary | ICD-10-CM

## 2013-12-29 DIAGNOSIS — Z7901 Long term (current) use of anticoagulants: Secondary | ICD-10-CM

## 2013-12-29 LAB — POCT INR: INR: 3.3

## 2014-01-25 ENCOUNTER — Ambulatory Visit (INDEPENDENT_AMBULATORY_CARE_PROVIDER_SITE_OTHER): Payer: Medicare PPO | Admitting: Pharmacist Clinician (PhC)/ Clinical Pharmacy Specialist

## 2014-01-25 ENCOUNTER — Encounter: Payer: Self-pay | Admitting: Cardiology

## 2014-01-25 ENCOUNTER — Ambulatory Visit (INDEPENDENT_AMBULATORY_CARE_PROVIDER_SITE_OTHER): Payer: Medicare PPO | Admitting: Cardiology

## 2014-01-25 VITALS — BP 140/66 | HR 69 | Ht 66.0 in | Wt 135.0 lb

## 2014-01-25 DIAGNOSIS — Z9889 Other specified postprocedural states: Secondary | ICD-10-CM

## 2014-01-25 DIAGNOSIS — I4891 Unspecified atrial fibrillation: Secondary | ICD-10-CM

## 2014-01-25 DIAGNOSIS — Z5181 Encounter for therapeutic drug level monitoring: Secondary | ICD-10-CM

## 2014-01-25 DIAGNOSIS — G459 Transient cerebral ischemic attack, unspecified: Secondary | ICD-10-CM

## 2014-01-25 DIAGNOSIS — I480 Paroxysmal atrial fibrillation: Secondary | ICD-10-CM

## 2014-01-25 DIAGNOSIS — I10 Essential (primary) hypertension: Secondary | ICD-10-CM

## 2014-01-25 DIAGNOSIS — Z7901 Long term (current) use of anticoagulants: Secondary | ICD-10-CM

## 2014-01-25 DIAGNOSIS — E785 Hyperlipidemia, unspecified: Secondary | ICD-10-CM

## 2014-01-25 DIAGNOSIS — R0989 Other specified symptoms and signs involving the circulatory and respiratory systems: Secondary | ICD-10-CM

## 2014-01-25 LAB — POCT INR: INR: 3.4

## 2014-01-25 NOTE — Patient Instructions (Signed)
Your physician recommends that you have  lab work today--Lipid profile/CBCd/BMET.  Your physician wants you to follow-up in: 1 year with Dr Shirlee Latch. (September 2016). You will receive a reminder letter in the mail two months in advance. If you don't receive a letter, please call our office to schedule the follow-up appointment.

## 2014-01-26 LAB — BASIC METABOLIC PANEL
BUN: 9 mg/dL (ref 6–23)
CALCIUM: 9.7 mg/dL (ref 8.4–10.5)
CO2: 25 meq/L (ref 19–32)
Chloride: 98 mEq/L (ref 96–112)
Creatinine, Ser: 0.8 mg/dL (ref 0.4–1.2)
GFR: 74.58 mL/min (ref >60.00)
GLUCOSE: 99 mg/dL (ref 70–99)
Potassium: 4.7 mEq/L (ref 3.5–5.1)
Sodium: 132 mEq/L — ABNORMAL LOW (ref 135–145)

## 2014-01-26 LAB — CBC WITH DIFFERENTIAL/PLATELET
BASOS ABS: 0 10*3/uL (ref 0.0–0.1)
Basophils Relative: 0.3 % (ref 0.0–3.0)
EOS PCT: 2.8 % (ref 0.0–5.0)
Eosinophils Absolute: 0.3 10*3/uL (ref 0.0–0.7)
HCT: 45.4 % (ref 36.0–46.0)
Hemoglobin: 15.6 g/dL — ABNORMAL HIGH (ref 12.0–15.0)
LYMPHS PCT: 21.2 % (ref 12.0–46.0)
Lymphs Abs: 2.4 10*3/uL (ref 0.7–4.0)
MCHC: 34.4 g/dL (ref 30.0–36.0)
MCV: 91.4 fl (ref 78.0–100.0)
MONOS PCT: 5.4 % (ref 3.0–12.0)
Monocytes Absolute: 0.6 10*3/uL (ref 0.1–1.0)
Neutro Abs: 7.9 10*3/uL — ABNORMAL HIGH (ref 1.4–7.7)
Neutrophils Relative %: 70.3 % (ref 43.0–77.0)
PLATELETS: 217 10*3/uL (ref 150.0–400.0)
RBC: 4.97 Mil/uL (ref 3.87–5.11)
RDW: 13.3 % (ref 11.5–15.5)
WBC: 11.2 10*3/uL — AB (ref 4.0–10.5)

## 2014-01-26 LAB — LIPID PANEL
Cholesterol: 169 mg/dL (ref 0–200)
HDL: 47.5 mg/dL (ref >39.00)
LDL CALC: 108 mg/dL — AB (ref 0–99)
NonHDL: 121.5
TRIGLYCERIDES: 67 mg/dL (ref 0.0–149.0)
Total CHOL/HDL Ratio: 4
VLDL: 13.4 mg/dL (ref 0.0–40.0)

## 2014-01-27 NOTE — Progress Notes (Signed)
Patient ID: [name] [name], female   DOB: 15-Jun-1940, 73 y.o.   MRN: [id number] PCP: Dr. Elease Hashimoto  73 yo with history of mitral regurgitation s/p MV replacement with Bjork-Shiley tilting disc valve in 1980 as well as paroxysmal atrial fibrillation and history of TIA presents for cardiology followup. She does continue to smoke 1 ppd.  She can walk on flat ground at a steady pace without problems, gets short of breath if she "hurries".  No chest pain.  No tachypalpitations.  She is in sinus rhythm today.  No recent TIA-like symptoms.  She has mild to moderate dementia.    ECG: NSR, lateral/anterolateral shallow T wave inversions (no significant change)  Labs (9/14): K 5.1, creatinine 0.66, LDL 104, HDL 56  PMH: 1. Mitral regurgitation s/p mitral valve replacement in 1980 with Bjork-Shiley tilting disc mechanical mitral valve.   Echo (4/09) with EF 55%, well-seated mechanical mitral valve with mean gradient 6 mmHg across the valve.  Echo (12/12) with EF 55-60%, mechanical mitral valve with mean gradient 4 mmHg, RV normal size/function, RV-RA gradient 38 mmHg.  2. Atrial flutter s/p atrial flutter ablation.  3. Paroxysmal atrial fibrillation.  4. TIA 5. Dementia: Moderate.  6. Active smoker 7. GERD 8. HTN 9. Depression 10. Carotid stenosis: 9/14 carotid dopplers with mild bilateral plaque.   SH: Married, lives in [place] with husband.  Smokes 1 ppd.  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   FH: No premature CAD.   ROS: All systems reviewed and negative except as per HPI.   Current Outpatient Prescriptions  Medication Sig Dispense Refill  . Ascorbic Acid (VITAMIN C) 500 MG tablet Take 500 mg by mouth daily.        [name] aspirin EC 81 MG EC tablet Take 81 mg by mouth daily.        [name] atorvastatin (LIPITOR) 20 MG tablet Take 1 tablet (20 mg total) by mouth daily.  30 tablet  1  . Cyanocobalamin (VITAMIN B 12) 250 MCG LOZG Take 500 mg by mouth daily.        . metoprolol  (LOPRESSOR) 50 MG tablet Take 1.5 tablets (75 mg total) by mouth 2 (two) times daily.  90 tablet  1  . Multiple Vitamins-Calcium (EQL ONE DAILY WOMENS PO) Take 1 tablet by mouth daily.        . temazepam (RESTORIL) 30 MG capsule Take 30 mg by mouth at bedtime.        [name] warfarin (COUMADIN) 5 MG tablet 1 tablet daily except 1/2 tablet on Wednesdays or as directed by the Anticoagulation clinic.  45 tablet  3   No current facility-administered medications for this visit.    BP 140/66  Pulse 69  Ht  (1.676 m)  Wt 135 lb (61.236 kg)  BMI 21.80 kg/m2 General: NAD Neck: No JVD, no thyromegaly or thyroid nodule.  Lungs: Clear to auscultation bilaterally with normal respiratory effort. CV: Nondisplaced PMI.  Heart regular S1/S2 (mechanical S1), no S3/S4, 2/6 systolic murmur upper sternal border.  No peripheral edema.  Left carotid bruit.  Normal pedal pulses.  Abdomen: Soft, nontender, no hepatosplenomegaly, no distention.  Neurologic: Alert and oriented x 3.  Psych: Normal affect. Extremities: No clubbing or cyanosis.   Assessment/Plan: 1. Mechanical mitral valve: Continue warfarin goal INR 2.5-3.5 and ASA 81 daily.  No overt bleeding.  Will check CBC.  Needs endocarditis prophylaxis with dental work.  If she has to come off warfarin, she should  get Lovenox bridging.  I will get an echo at followup in 1 year unless symptoms change.  2. Atrial fibrillation: Paroxysmal.  No tachypalpitations, in NSR today.  On warfarin as above.  Will continue metoprolol. 3. Hyperlipidemia: Check lipids today.  Goal LDL < 70 with vascular disease (carotid stenosis).  4. Carotid stenosis: Mild on 9/14 carotid dopplers, repeat 9/16.   [name] 01/27/2014

## 2014-01-28 ENCOUNTER — Other Ambulatory Visit: Payer: Self-pay | Admitting: Cardiology

## 2014-01-28 ENCOUNTER — Ambulatory Visit: Payer: Medicare PPO | Admitting: Cardiology

## 2014-02-09 ENCOUNTER — Other Ambulatory Visit: Payer: Self-pay | Admitting: *Deleted

## 2014-02-09 MED ORDER — ATORVASTATIN CALCIUM 20 MG PO TABS: 20.0000 mg | ORAL_TABLET | Freq: Every day | ORAL | Status: DC

## 2014-02-23 ENCOUNTER — Ambulatory Visit (INDEPENDENT_AMBULATORY_CARE_PROVIDER_SITE_OTHER): Payer: Medicare PPO

## 2014-02-23 DIAGNOSIS — I4891 Unspecified atrial fibrillation: Secondary | ICD-10-CM

## 2014-02-23 DIAGNOSIS — G459 Transient cerebral ischemic attack, unspecified: Secondary | ICD-10-CM

## 2014-02-23 DIAGNOSIS — Z9889 Other specified postprocedural states: Secondary | ICD-10-CM

## 2014-02-23 DIAGNOSIS — Z5181 Encounter for therapeutic drug level monitoring: Secondary | ICD-10-CM

## 2014-02-23 DIAGNOSIS — Z7901 Long term (current) use of anticoagulants: Secondary | ICD-10-CM

## 2014-02-23 LAB — POCT INR: INR: 3.1

## 2014-02-24 ENCOUNTER — Other Ambulatory Visit: Payer: Self-pay | Admitting: Cardiology

## 2014-02-25 MED ORDER — LISINOPRIL 10 MG PO TABS: ORAL_TABLET | ORAL | Status: DC

## 2014-02-25 NOTE — Telephone Encounter (Signed)
I confirmed with patient's husband that pt is  taking lisinopril 10mg  daily and has been taking this regularly.  Looks like it was removed from her medication list in error at the time of office visit with Dr Shirlee LatchMcLean in September.

## 2014-02-25 NOTE — Addendum Note (Signed)
Addended by: Jacqlyn KraussLANKFORD, ANNE M on: 02/25/2014 06:01 PM   Modules accepted: Orders

## 2014-03-30 ENCOUNTER — Ambulatory Visit (INDEPENDENT_AMBULATORY_CARE_PROVIDER_SITE_OTHER): Payer: Medicare PPO

## 2014-03-30 DIAGNOSIS — Z5181 Encounter for therapeutic drug level monitoring: Secondary | ICD-10-CM

## 2014-03-30 DIAGNOSIS — Z7901 Long term (current) use of anticoagulants: Secondary | ICD-10-CM

## 2014-03-30 DIAGNOSIS — I4891 Unspecified atrial fibrillation: Secondary | ICD-10-CM

## 2014-03-30 DIAGNOSIS — Z9889 Other specified postprocedural states: Secondary | ICD-10-CM

## 2014-03-30 DIAGNOSIS — G459 Transient cerebral ischemic attack, unspecified: Secondary | ICD-10-CM

## 2014-03-30 LAB — POCT INR: INR: 4.1

## 2014-04-27 ENCOUNTER — Ambulatory Visit (INDEPENDENT_AMBULATORY_CARE_PROVIDER_SITE_OTHER): Payer: Medicare PPO

## 2014-04-27 DIAGNOSIS — Z9889 Other specified postprocedural states: Secondary | ICD-10-CM

## 2014-04-27 DIAGNOSIS — G459 Transient cerebral ischemic attack, unspecified: Secondary | ICD-10-CM

## 2014-04-27 DIAGNOSIS — Z7901 Long term (current) use of anticoagulants: Secondary | ICD-10-CM

## 2014-04-27 DIAGNOSIS — I4891 Unspecified atrial fibrillation: Secondary | ICD-10-CM

## 2014-04-27 DIAGNOSIS — Z5181 Encounter for therapeutic drug level monitoring: Secondary | ICD-10-CM

## 2014-04-27 LAB — POCT INR: INR: 4.3

## 2014-05-05 ENCOUNTER — Emergency Department (HOSPITAL_COMMUNITY): Payer: Medicare PPO

## 2014-05-05 ENCOUNTER — Encounter (HOSPITAL_COMMUNITY): Payer: Self-pay | Admitting: Emergency Medicine

## 2014-05-05 ENCOUNTER — Inpatient Hospital Stay (HOSPITAL_COMMUNITY)
Admission: EM | Admit: 2014-05-05 | Discharge: 2014-05-11 | DRG: 470 | Disposition: A | Discharge status: Skilled Nursing Facility | Payer: Medicare PPO | Attending: Internal Medicine | Admitting: Internal Medicine

## 2014-05-05 DIAGNOSIS — S72002A Fracture of unspecified part of neck of left femur, initial encounter for closed fracture: Principal | ICD-10-CM | POA: Diagnosis present

## 2014-05-05 DIAGNOSIS — F17210 Nicotine dependence, cigarettes, uncomplicated: Secondary | ICD-10-CM | POA: Diagnosis present

## 2014-05-05 DIAGNOSIS — W19XXXA Unspecified fall, initial encounter: Secondary | ICD-10-CM | POA: Diagnosis present

## 2014-05-05 DIAGNOSIS — F419 Anxiety disorder, unspecified: Secondary | ICD-10-CM | POA: Diagnosis present

## 2014-05-05 DIAGNOSIS — N390 Urinary tract infection, site not specified: Secondary | ICD-10-CM | POA: Diagnosis present

## 2014-05-05 DIAGNOSIS — E785 Hyperlipidemia, unspecified: Secondary | ICD-10-CM | POA: Diagnosis present

## 2014-05-05 DIAGNOSIS — M25552 Pain in left hip: Secondary | ICD-10-CM | POA: Diagnosis not present

## 2014-05-05 DIAGNOSIS — Z9889 Other specified postprocedural states: Secondary | ICD-10-CM

## 2014-05-05 DIAGNOSIS — Z885 Allergy status to narcotic agent status: Secondary | ICD-10-CM

## 2014-05-05 DIAGNOSIS — F329 Major depressive disorder, single episode, unspecified: Secondary | ICD-10-CM | POA: Diagnosis present

## 2014-05-05 DIAGNOSIS — I480 Paroxysmal atrial fibrillation: Secondary | ICD-10-CM | POA: Diagnosis present

## 2014-05-05 DIAGNOSIS — G4700 Insomnia, unspecified: Secondary | ICD-10-CM | POA: Diagnosis present

## 2014-05-05 DIAGNOSIS — I4892 Unspecified atrial flutter: Secondary | ICD-10-CM | POA: Diagnosis present

## 2014-05-05 DIAGNOSIS — Y9389 Activity, other specified: Secondary | ICD-10-CM

## 2014-05-05 DIAGNOSIS — F0390 Unspecified dementia without behavioral disturbance: Secondary | ICD-10-CM | POA: Diagnosis present

## 2014-05-05 DIAGNOSIS — I340 Nonrheumatic mitral (valve) insufficiency: Secondary | ICD-10-CM | POA: Diagnosis present

## 2014-05-05 DIAGNOSIS — K219 Gastro-esophageal reflux disease without esophagitis: Secondary | ICD-10-CM | POA: Diagnosis present

## 2014-05-05 DIAGNOSIS — Z7901 Long term (current) use of anticoagulants: Secondary | ICD-10-CM

## 2014-05-05 DIAGNOSIS — Z952 Presence of prosthetic heart valve: Secondary | ICD-10-CM

## 2014-05-05 DIAGNOSIS — I2510 Atherosclerotic heart disease of native coronary artery without angina pectoris: Secondary | ICD-10-CM | POA: Diagnosis present

## 2014-05-05 DIAGNOSIS — D72829 Elevated white blood cell count, unspecified: Secondary | ICD-10-CM | POA: Diagnosis present

## 2014-05-05 DIAGNOSIS — I10 Essential (primary) hypertension: Secondary | ICD-10-CM | POA: Diagnosis present

## 2014-05-05 DIAGNOSIS — I6529 Occlusion and stenosis of unspecified carotid artery: Secondary | ICD-10-CM | POA: Diagnosis present

## 2014-05-05 DIAGNOSIS — Z8673 Personal history of transient ischemic attack (TIA), and cerebral infarction without residual deficits: Secondary | ICD-10-CM

## 2014-05-05 LAB — CBC WITH DIFFERENTIAL/PLATELET
BASOS PCT: 0 % (ref 0–1)
Basophils Absolute: 0 10*3/uL (ref 0.0–0.1)
Eosinophils Absolute: 0.1 10*3/uL (ref 0.0–0.7)
Eosinophils Relative: 1 % (ref 0–5)
HCT: 44.7 % (ref 36.0–46.0)
HEMOGLOBIN: 15.4 g/dL — AB (ref 12.0–15.0)
LYMPHS ABS: 1.5 10*3/uL (ref 0.7–4.0)
Lymphocytes Relative: 9 % — ABNORMAL LOW (ref 12–46)
MCH: 30.4 pg (ref 26.0–34.0)
MCHC: 34.5 g/dL (ref 30.0–36.0)
MCV: 88.3 fL (ref 78.0–100.0)
MONOS PCT: 5 % (ref 3–12)
Monocytes Absolute: 0.8 10*3/uL (ref 0.1–1.0)
NEUTROS ABS: 14.6 10*3/uL — AB (ref 1.7–7.7)
Neutrophils Relative %: 85 % — ABNORMAL HIGH (ref 43–77)
Platelets: 215 10*3/uL (ref 150–400)
RBC: 5.06 MIL/uL (ref 3.87–5.11)
RDW: 12.9 % (ref 11.5–15.5)
WBC: 17.1 10*3/uL — ABNORMAL HIGH (ref 4.0–10.5)

## 2014-05-05 LAB — BASIC METABOLIC PANEL
ANION GAP: 9 (ref 5–15)
BUN: 10 mg/dL (ref 6–23)
CHLORIDE: 102 meq/L (ref 96–112)
CO2: 24 mmol/L (ref 19–32)
Calcium: 9.3 mg/dL (ref 8.4–10.5)
Creatinine, Ser: 0.72 mg/dL (ref 0.50–1.10)
GFR calc Af Amer: >90 mL/min (ref >90)
GFR calc non Af Amer: 83 mL/min — ABNORMAL LOW (ref >90)
Glucose, Bld: 122 mg/dL — ABNORMAL HIGH (ref 70–99)
POTASSIUM: 4.1 mmol/L (ref 3.5–5.1)
Sodium: 135 mmol/L (ref 135–145)

## 2014-05-05 LAB — PROTIME-INR
INR: 2.81 — AB (ref 0.00–1.49)
PROTHROMBIN TIME: 29.8 s — AB (ref 11.6–15.2)

## 2014-05-05 MED ORDER — HYDROMORPHONE HCL 1 MG/ML IJ SOLN
0.5000 mg | Freq: Once | INTRAMUSCULAR | Status: AC
  Administered 2014-05-05: 0.5 mg via INTRAVENOUS
  Filled 2014-05-05: qty 1

## 2014-05-05 MED ORDER — ONDANSETRON HCL 4 MG/2ML IJ SOLN
4.0000 mg | Freq: Once | INTRAMUSCULAR | Status: AC
  Administered 2014-05-05: 4 mg via INTRAVENOUS
  Filled 2014-05-05: qty 2

## 2014-05-05 NOTE — H&P (Signed)
Date: 05/06/2014               Patient Name:  [name] [name] MRN: [id number]  DOB: 16-Jan-1941 Age / Sex: 73 y.o., female   PCP: Lorie Phenix, MD         Medical Service: Internal Medicine Teaching Service         Attending Physician: Dr. Mirian Mo, MD    First Contact: Dr. Danella Penton Pager: [phone]  Second Contact: Dr. Yetta Barre Pager: [phone]       After Hours (After 5p/  First Contact Pager: [phone]  weekends / holidays): Second Contact Pager: [phone]   Chief Complaint: left hip pain after a fall  History of Present Illness:  73 yo female with hx of CAD, MR s/p mech mitral valve, paroxysmal Afib on coumadin, mild carotid stenosis, HLD, HTN, depression, anxiety here with left hip pain after a fall. Patient stated she had a fall this morning when she was walking around in a hurry and tripped with the end of her bed. She fell and landed on her left side. Husband was present during this fall. Patient is not quite sure if she hit her head. At different times she said she is having pain on her head and then later she denied. She was having hip pain. She has baseline dementia, history is not very clear. Tried to confirm most of the history with her husband. He is also not sure whether she hit her head. She was doing well before the fall without any recent illness or infection. No dizziness or LOC. No other hx of falls. At baseline she is not oriented to date or time and according to husband she is at her baseline mental status. She denies any cough, sob, chest pain, n/v, diarrhea, dysuria, pain anywhere else other than her head or hip. No bleeding from anywhere.  She is compliant with her meds. Husband helps her with meds. Sees Dr. Shirlee Latch cardiology.  Xray done at ED showed mildly displaced subcapital fracture through the left femoral neck. Was given dilaudid for pain control in the ED, not sure how much it helped but seems to be controlling the pain. She appears somewhat uncomfortable  during the interview, was diaphoretic, but states she does sweat a lot usually. No other complaints.  Dr. Ophelia Charter ortho was consulted by [place] and he stated he will take the patient to OR in around 2-3 days for her left femur fracture.   Meds: Current Facility-Administered Medications  Medication Dose Route Frequency Provider Last Rate Last Dose  . phytonadione (VITAMIN K) tablet 5 mg  5 mg Oral Once Gust Rung, DO       Current Outpatient Prescriptions  Medication Sig Dispense Refill  . Ascorbic Acid (VITAMIN C) 500 MG tablet Take 500 mg by mouth daily.      [name] aspirin EC 81 MG EC tablet Take 81 mg by mouth daily.      [name] atorvastatin (LIPITOR) 20 MG tablet Take 1 tablet (20 mg total) by mouth daily. 30 tablet 10  . buPROPion (WELLBUTRIN) 75 MG tablet Take 75 mg by mouth daily.    . Cyanocobalamin (VITAMIN B 12) 250 MCG LOZG Take 500 mg by mouth daily.      [name] escitalopram (LEXAPRO) 20 MG tablet Take 20 mg by mouth daily.    [name] guaiFENesin (ROBITUSSIN) 100 MG/5ML liquid Take 200 mg by mouth 3 (three) times daily as needed for cough.    [name] lisinopril (PRINIVIL,ZESTRIL) 10 MG  tablet TAKE 1 TABLET BY MOUTH ONCE A DAY 30 tablet 12  . metoprolol (LOPRESSOR) 50 MG tablet TAKE 1 AND 1/2 TABLET BY MOUTH TWICE DAILY 90 tablet 11  . Multiple Vitamins-Calcium (EQL ONE DAILY WOMENS PO) Take 1 tablet by mouth daily.      . temazepam (RESTORIL) 30 MG capsule Take 30 mg by mouth at bedtime.      [name]. warfarin (COUMADIN) 5 MG tablet 1 tablet daily except 1/2 tablet on Wednesdays or as directed by the Anticoagulation clinic. (Patient taking differently: Takes 1/2 tablet on Wednesdays and Saturdays Takes 1 tablet all other days) 45 tablet 3    Allergies: Allergies as of 05/05/2014 - Review Complete 05/05/2014  Allergen Reaction Noted  . Codeine Other (See Comments)    Past Medical History  Diagnosis Date  . CAD (coronary artery disease) 08/2005    non-obstructive by cath  . Atrial fibrillation     on  chronic coumadin  . Depression   . Atrial flutter     s/p RFA x 2  . Mitral regurgitation     S/P Bjork Shiley mechanical mitral valve replacement in 1980  . Gastroesophageal reflux disease   . TIA (transient ischemic attack)   . HTN (hypertension)   . Anxiety   . HLD (hyperlipidemia)   . Carotid stenosis     a. Carotid US (9/14):  Bilat 1-39% => f/u PRN   Past Surgical History  Procedure Laterality Date  . A flutter ablation      x 2  . Mitral valve replacement      Bjork-Shiley valve placed for mitral regurgitation   Family History  Problem Relation Age of Onset  . Heart attack Other    History   Social History  . Marital Status: Married    Spouse Name: N/A    Number of Children: N/A  . Years of Education: N/A   Occupational History  . Not on file.   Social History Main Topics  . Smoking status: Current Every Day Smoker -- 2.00 packs/day    Types: Cigarettes  . Smokeless tobacco: Current User  . Alcohol Use: No  . Drug Use: No  . Sexual Activity: Not on file   Other Topics Concern  . Not on file   Social History Narrative    Review of Systems: Review of Systems  Constitutional: Positive for diaphoresis. Negative for fever, chills, weight loss and malaise/fatigue.  HENT: Negative for congestion, ear discharge, ear pain, nosebleeds, sore throat and tinnitus.   Eyes: Negative for blurred vision, double vision, photophobia, pain, discharge and redness.  Respiratory: Negative for cough, hemoptysis, sputum production, shortness of breath, wheezing and stridor.   Cardiovascular: Negative for chest pain, palpitations, orthopnea, claudication, leg swelling and PND.  Gastrointestinal: Negative for heartburn, nausea, vomiting, abdominal pain, diarrhea, constipation, blood in stool and melena.  Genitourinary: Negative for dysuria, urgency, hematuria and flank pain.  Musculoskeletal: Positive for joint pain and falls. Negative for myalgias, back pain and neck pain.        Pain on left hip  Skin: Negative.   Neurological: Positive for headaches. Negative for dizziness, tingling, tremors, sensory change, speech change, focal weakness, seizures, loss of consciousness and weakness.  Endo/Heme/Allergies: Negative.   Psychiatric/Behavioral: Positive for depression and memory loss. Negative for hallucinations and substance abuse. The patient is nervous/anxious and has insomnia.      Physical Exam: Blood pressure 143/57, pulse 72, temperature 97.7 F (36.5 C), temperature source Oral, resp. rate  16, height 5\' 7"  (1.702 m), weight 135 lb (61.236 kg), SpO2 95 %. Physical Exam  Constitutional: She appears well-developed and well-nourished.  Appears to be uncomfortable, trying to move around in bed to get comfortable. Diaphoretic.   HENT:  Head: Normocephalic and atraumatic.  Right Ear: External ear normal.  Left Ear: External ear normal.  Nose: Nose normal.  Mouth/Throat: Oropharynx is clear and moist. No oropharyngeal exudate.  Eyes: Conjunctivae and EOM are normal. Pupils are equal, round, and reactive to light. Right eye exhibits no discharge. Left eye exhibits no discharge. No scleral icterus.  Neck: Normal range of motion. Neck supple. No JVD present. No tracheal deviation present. No thyromegaly present.  Cardiovascular: Exam reveals no gallop and no friction rub.   Irregularly irregular. Mechanical valve sound.   Respiratory: Effort normal and breath sounds normal. No stridor.  GI: Soft. Bowel sounds are normal. She exhibits no distension and no mass. There is no tenderness. There is no rebound and no guarding.  Musculoskeletal: She exhibits no edema or tenderness.  ROM on left hip limited due to the hip immobilizer and pain. Rest of the ROM is normal.   Lymphadenopathy:    She has no cervical adenopathy.  Neurological: She is alert. She displays normal reflexes. No cranial nerve deficit. She exhibits normal muscle tone. Coordination normal.  Oriented  only to place. States she is in [place]. Not sure about date, time, holiday, or the president. This is her baseline per husband.  Skin: Skin is warm. She is diaphoretic.  Psychiatric: She has a normal mood and affect. Her behavior is normal.    Lab results: Basic Metabolic Panel:  Recent Labs  96/04/54 2142  NA 135  K 4.1  CL 102  CO2 24  GLUCOSE 122*  BUN 10  CREATININE 0.72  CALCIUM 9.3   Liver Function Tests: No results for input(s): AST, ALT, ALKPHOS, BILITOT, PROT, ALBUMIN in the last 72 hours. No results for input(s): LIPASE, AMYLASE in the last 72 hours. No results for input(s): AMMONIA in the last 72 hours. CBC:  Recent Labs  05/05/14 2142  WBC 17.1*  NEUTROABS 14.6*  HGB 15.4*  HCT 44.7  MCV 88.3  PLT 215   Cardiac Enzymes: No results for input(s): CKTOTAL, CKMB, CKMBINDEX, TROPONINI in the last 72 hours. BNP: No results for input(s): PROBNP in the last 72 hours. D-Dimer: No results for input(s): DDIMER in the last 72 hours. CBG: No results for input(s): GLUCAP in the last 72 hours. Hemoglobin A1C: No results for input(s): HGBA1C in the last 72 hours. Fasting Lipid Panel: No results for input(s): CHOL, HDL, LDLCALC, TRIG, CHOLHDL, LDLDIRECT in the last 72 hours. Thyroid Function Tests: No results for input(s): TSH, T4TOTAL, FREET4, T3FREE, THYROIDAB in the last 72 hours. Anemia Panel: No results for input(s): VITAMINB12, FOLATE, FERRITIN, TIBC, IRON, RETICCTPCT in the last 72 hours. Coagulation:  Recent Labs  05/05/14 2142  LABPROT 29.8*  INR 2.81*   Urine Drug Screen: Drugs of Abuse  No results found for: LABOPIA, COCAINSCRNUR, LABBENZ, AMPHETMU, THCU, LABBARB  Alcohol Level: No results for input(s): ETH in the last 72 hours. Urinalysis: No results for input(s): COLORURINE, LABSPEC, PHURINE, GLUCOSEU, HGBUR, BILIRUBINUR, KETONESUR, PROTEINUR, UROBILINOGEN, NITRITE, LEUKOCYTESUR in the last 72 hours.  Invalid input(s): APPERANCEUR Misc.  Labs:  Imaging results:  Dg Hip Complete Left  05/05/2014   CLINICAL DATA:  Status post fall. Acute onset of left hip pain and shortening. Initial encounter.  EXAM: LEFT HIP -  COMPLETE 2+ VIEW  COMPARISON:  None.  FINDINGS: There is a mildly displaced subcapital fracture through the left femoral neck, with shortening at the fracture site. The left femoral head remains seated at the acetabulum. No additional fractures are seen. The right hip joint is unremarkable in appearance.  Mild degenerative change is noted at the lower lumbar spine, with mild rotatory scoliosis. The sacroiliac joints are grossly unremarkable. The visualized bowel gas pattern is within normal limits.  IMPRESSION: Mildly displaced subcapital fracture through the left femoral neck, with shortening at the fracture site.   Electronically Signed   By: Roanna RaiderJeffery  Chang M.D.   On: 05/05/2014 22:54   Dg Femur Left  05/05/2014   CLINICAL DATA:  Status post fall. Acute onset of left hip pain and shortening. Initial encounter.  EXAM: LEFT FEMUR - 2 VIEW  COMPARISON:  CT of the abdomen and pelvis performed 04/08/2009  FINDINGS: There is a mildly displaced subcapital fracture through the left femoral neck. No additional fractures are seen. The distal femur appears intact. The knee joint is grossly unremarkable. No knee joint effusion is identified. No significant soft tissue abnormalities are seen.  IMPRESSION: Mildly displaced subcapital fracture through the left femoral neck.   Electronically Signed   By: Roanna RaiderJeffery  Chang M.D.   On: 05/05/2014 22:53    Other results: EKG: afib, nonspecific T wave changes. QTC prolonged but hard to say since it's irregular.   Assessment & Plan by Problem: Principal Problem:   Displaced fracture of left femoral neck Active Problems:   Essential hypertension, benign   Atrial fibrillation   MITRAL VALVE REPLACEMENT, HX OF   Dementia   Leukocytosis 73 yo female with mechanical MV, paroxysmal Afib, HTN,   Depression, anxiety, here with left hip pain after a fall today, xray showing left femur fracture.  Left Femur fracture s/p fall - Mildly displaced subcapital fracture through the left femoral neck ,s/p mechanical fall. Not sure whether she hit her head. No neuro deficits, she has baseline dementia and appears to be in her baseline mental status per husband.  - spoke to ortho Dr. Ophelia CharterYates. He will see the patient tomorrow and likely take her to OR in 2-3 days. Will continue to treat her pain in the mean time with dialudid 0.5mg  q4hr PRN - will get CT head to rule out intracranial hemorrhage al though I don't see any bruising on exam. We have to make certain since she is on coumadin. - continue him immobilizer - will reverse coumadin with vitamin K 5mg . Discussed with pharmacy. This is in preparation for her surgery. Will check INR.  - will bridge her with IV heparin per pharmacy in the mean time. - will get UA to make sure she didn't have UTI causing her fall. Unlikely to be UTI since she doesn't have any dysuria.   Leukocytosis - unclear etiology. WBC 17.1. Was 11.2 3 months ago. - unsure of the cause. Does have neutrophil predominance, 85%. Doesn't have any sign of infection (no respiratory symptoms or dysuria, no diarrhea, no sinus pressure, ab pain, etc.). could me just from the stress of the fall and pain  - will check UA. Will hold CXR for now since not having any resp symptoms. - will monitor white count and watch out for any fever - will get culture if febrile and start abx.   MR s/p mitral valve replacement 1980 - on coumadin, INR goal 2.5-3.5. Recently seen by cards 01/2014.  - will hold coumadin and  reverse with vitamin K as above. per card notes on 01/2014, will need to bridge her if off coumadin so will do with IV heparin.  Paroxysmal Afib - on coumadin at home.  - hold coumadin as above, bridge with IV heparin - cont metoprolol.  HTN - BP labile here but mostly controlled in 140's.  At home takes lisinopril 10mg , metoprolol 75mg  BID - will continue home meds.  Mild carotid stenosis - stable on 2014 carotid doppler, 1-39% b/l ICA stenosis.   Depression - cont lexapro Anxiety - cont bupropion Insomnia - cont restoril.    Dispo: Disposition is deferred at this time, awaiting improvement of current medical problems. Anticipated discharge in approximately 2 day(s).   The patient does have a current PCP Lorie Phenix, MD) and does need an [facility] hospital follow-up appointment after discharge.  The patient does have transportation limitations that hinder transportation to clinic appointments.  Signed: Hyacinth Meeker, MD 05/06/2014, 12:39 AM

## 2014-05-05 NOTE — ED Notes (Addendum)
Pt brought to ED by [facility] from home after falling at home, pt states she trip with something on the floor. Pt states she hit her head but no that much, denies any LOC, pt having 6/10 pain on her left leg, that leg lock shorter than right leg. Pt got 100 mcg of Fentanyl by [facility], pt is on Coumadin at home.

## 2014-05-05 NOTE — ED Notes (Signed)
Pt states she is unable to give us a urine sample a this time secondary to pain on her left hip.

## 2014-05-05 NOTE — ED Notes (Signed)
Informed pt that we need a urine sample. Pt un wanting to try at this time.

## 2014-05-05 NOTE — ED Provider Notes (Signed)
CSN: [id number]     Arrival date & time 05/05/14  2112 History   First MD Initiated Contact with Patient 05/05/14 2128     Chief Complaint  Patient presents with  . Fall  . Hip Pain     (Consider location/radiation/quality/duration/timing/severity/associated sxs/prior Treatment) Patient is a 73 y.o. female presenting with fall and hip pain.  Fall This is a new problem. The current episode started less than 1 hour ago. The problem occurs constantly. The problem has not changed since onset.Pertinent negatives include no chest pain, no abdominal pain, no headaches and no shortness of breath. Exacerbated by: movement, palpation. Nothing relieves the symptoms. She has tried nothing for the symptoms.  Hip Pain This is a new problem. The current episode started less than 1 hour ago. Pertinent negatives include no chest pain, no abdominal pain, no headaches and no shortness of breath. The symptoms are aggravated by bending, twisting and stress. Nothing relieves the symptoms. She has tried nothing for the symptoms.    Past Medical History  Diagnosis Date  . CAD (coronary artery disease) 08/2005    non-obstructive by cath  . Atrial fibrillation     on chronic coumadin  . Depression   . Atrial flutter     s/p RFA x 2  . Mitral regurgitation     S/P Bjork Shiley mechanical mitral valve replacement in 1980  . Gastroesophageal reflux disease   . TIA (transient ischemic attack)   . HTN (hypertension)   . Anxiety   . HLD (hyperlipidemia)   . Carotid stenosis     a. Carotid US (9/14):  Bilat 1-39% => f/u PRN   Past Surgical History  Procedure Laterality Date  . A flutter ablation      x 2  . Mitral valve replacement      Bjork-Shiley valve placed for mitral regurgitation   Family History  Problem Relation Age of Onset  . Heart attack Other    History  Substance Use Topics  . Smoking status: Current Every Day Smoker -- 2.00 packs/day    Types: Cigarettes  . Smokeless tobacco:  Current User  . Alcohol Use: No   OB History    No data available     Review of Systems  Respiratory: Negative for shortness of breath.   Cardiovascular: Negative for chest pain.  Gastrointestinal: Negative for abdominal pain.  Neurological: Negative for headaches.  All other systems reviewed and are negative.     Allergies  Codeine  Home Medications   Prior to Admission medications   Medication Sig Start Date End Date Taking? Authorizing Provider  Ascorbic Acid (VITAMIN C) 500 MG tablet Take 500 mg by mouth daily.     Yes Historical Provider, MD  aspirin EC 81 MG EC tablet Take 81 mg by mouth daily.     Yes Historical Provider, MD  atorvastatin (LIPITOR) 20 MG tablet Take 1 tablet (20 mg total) by mouth daily. 02/09/14  Yes Laurey Moralealton S McLean, MD  buPROPion (WELLBUTRIN) 75 MG tablet Take 75 mg by mouth daily. 04/25/14  Yes Historical Provider, MD  Cyanocobalamin (VITAMIN B 12) 250 MCG LOZG Take 500 mg by mouth daily.     Yes Historical Provider, MD  escitalopram (LEXAPRO) 20 MG tablet Take 20 mg by mouth daily. 04/11/14  Yes Historical Provider, MD  guaiFENesin (ROBITUSSIN) 100 MG/5ML liquid Take 200 mg by mouth 3 (three) times daily as needed for cough.   Yes Historical Provider, MD  lisinopril (PRINIVIL,ZESTRIL) 10 MG  tablet TAKE 1 TABLET BY MOUTH ONCE A DAY 02/25/14  Yes Laurey Moralealton S McLean, MD  metoprolol (LOPRESSOR) 50 MG tablet TAKE 1 AND 1/2 TABLET BY MOUTH TWICE DAILY 01/28/14  Yes Laurey Moralealton S McLean, MD  Multiple Vitamins-Calcium (EQL ONE DAILY WOMENS PO) Take 1 tablet by mouth daily.     Yes Historical Provider, MD  temazepam (RESTORIL) 30 MG capsule Take 30 mg by mouth at bedtime.     Yes Historical Provider, MD  warfarin (COUMADIN) 5 MG tablet 1 tablet daily except 1/2 tablet on Wednesdays or as directed by the Anticoagulation clinic. Patient taking differently: Takes 1/2 tablet on Wednesdays and Saturdays Takes 1 tablet all other days 11/04/13  Yes Laurey Moralealton S McLean, MD   BP  153/65 mmHg  Pulse 73  Temp(Src) 98.6 F (37 C) (Oral)  Resp 18  Ht 5\' 7"  (1.702 m)  Wt 137 lb 3.2 oz (62.234 kg)  BMI 21.48 kg/m2  SpO2 100% Physical Exam  Constitutional: She is oriented to person, place, and time. She appears well-developed and well-nourished.  HENT:  Head: Normocephalic and atraumatic.  Right Ear: External ear normal.  Left Ear: External ear normal.  Eyes: Conjunctivae and EOM are normal. Pupils are equal, round, and reactive to light.  Neck: Normal range of motion. Neck supple.  Cardiovascular: Normal rate, regular rhythm, normal heart sounds and intact distal pulses.   Pulmonary/Chest: Effort normal and breath sounds normal.  Abdominal: Soft. Bowel sounds are normal. There is no tenderness.  Musculoskeletal: Normal range of motion.       Left hip: She exhibits tenderness and bony tenderness.  shortened L leg, externally rotated  Neurological: She is alert and oriented to person, place, and time.  Skin: Skin is warm and dry.  Vitals reviewed.   ED Course  Procedures (including critical care time) Labs Review Labs Reviewed  BASIC METABOLIC PANEL - Abnormal; Notable for the following:    Glucose, Bld 122 (*)    GFR calc non Af Amer 83 (*)    All other components within normal limits  CBC WITH DIFFERENTIAL - Abnormal; Notable for the following:    WBC 17.1 (*)    Hemoglobin 15.4 (*)    Neutrophils Relative % 85 (*)    Neutro Abs 14.6 (*)    Lymphocytes Relative 9 (*)    All other components within normal limits  URINALYSIS, ROUTINE W REFLEX MICROSCOPIC - Abnormal; Notable for the following:    APPearance CLOUDY (*)    Hgb urine dipstick SMALL (*)    Ketones, ur 15 (*)    Nitrite POSITIVE (*)    Leukocytes, UA MODERATE (*)    All other components within normal limits  PROTIME-INR - Abnormal; Notable for the following:    Prothrombin Time 29.8 (*)    INR 2.81 (*)    All other components within normal limits  PROTIME-INR - Abnormal; Notable for  the following:    Prothrombin Time 32.4 (*)    INR 3.12 (*)    All other components within normal limits  BASIC METABOLIC PANEL - Abnormal; Notable for the following:    Glucose, Bld 160 (*)    GFR calc non Af Amer 82 (*)    All other components within normal limits  CBC - Abnormal; Notable for the following:    WBC 14.8 (*)    All other components within normal limits  URINE MICROSCOPIC-ADD ON - Abnormal; Notable for the following:    Bacteria, UA MANY (*)  All other components within normal limits  URINE CULTURE  PROTIME-INR  CBC WITH DIFFERENTIAL    Imaging Review Dg Hip Complete Left  05/05/2014   CLINICAL DATA:  Status post fall. Acute onset of left hip pain and shortening. Initial encounter.  EXAM: LEFT HIP - COMPLETE 2+ VIEW  COMPARISON:  None.  FINDINGS: There is a mildly displaced subcapital fracture through the left femoral neck, with shortening at the fracture site. The left femoral head remains seated at the acetabulum. No additional fractures are seen. The right hip joint is unremarkable in appearance.  Mild degenerative change is noted at the lower lumbar spine, with mild rotatory scoliosis. The sacroiliac joints are grossly unremarkable. The visualized bowel gas pattern is within normal limits.  IMPRESSION: Mildly displaced subcapital fracture through the left femoral neck, with shortening at the fracture site.   Electronically Signed   By: Roanna Raider M.D.   On: 05/05/2014 22:54   Dg Femur Left  05/05/2014   CLINICAL DATA:  Status post fall. Acute onset of left hip pain and shortening. Initial encounter.  EXAM: LEFT FEMUR - 2 VIEW  COMPARISON:  CT of the abdomen and pelvis performed 04/08/2009  FINDINGS: There is a mildly displaced subcapital fracture through the left femoral neck. No additional fractures are seen. The distal femur appears intact. The knee joint is grossly unremarkable. No knee joint effusion is identified. No significant soft tissue abnormalities are  seen.  IMPRESSION: Mildly displaced subcapital fracture through the left femoral neck.   Electronically Signed   By: Roanna Raider M.D.   On: 05/05/2014 22:53   Ct Head Wo Contrast  05/06/2014   CLINICAL DATA:  Fall  EXAM: CT HEAD WITHOUT CONTRAST  TECHNIQUE: Contiguous axial images were obtained from the base of the skull through the vertex without intravenous contrast.  COMPARISON:  04/08/2009  FINDINGS: Mild cortical volume loss noted with proportional ventricular prominence. Areas of periventricular white matter hypodensity are most compatible with small vessel ischemic change. No acute hemorrhage, infarct, or mass lesion is identified. No midline shift. Moderate right maxillary sinusitis. No skull fracture.  IMPRESSION: No acute intracranial finding.  Chronic findings as above.  Moderate right maxillary sinusitis.   Electronically Signed   By: Christiana Pellant M.D.   On: 05/06/2014 01:43     EKG Interpretation   Date/Time:  Thursday May 05 2014 21:25:44 EST Ventricular Rate:  72 PR Interval:  156 QRS Duration: 116 QT Interval:  462 QTC Calculation: 506 R Axis:   83 Text Interpretation:  Normal sinus rhythm LVH with secondary  repolarization abnormality Prolonged QT interval No significant change  since last tracing Confirmed by Mirian Mo [id number]) on 05/05/2014  9:34:43 PM      MDM   Final diagnoses:  Left hip pain  Fall    . 73 y.o. female with pertinent PMH of dementia presents with L hip fracture after mechanical fall.  No antecedent symptoms, loss of consciousness or other recent illness. On arrival vital signs and physical exam as above. X-ray with subcapital left femoral fracture. Consulted orthopedics who requested medical admission for operation in the morning. Consulted medicine. Admitted in stable condition.    I have reviewed all laboratory and imaging studies if ordered as above Dx: principal problem L femoral neck fracture 1. Left hip pain   2. Nicholaus Bloom, MD 05/06/14 [phone]

## 2014-05-06 ENCOUNTER — Encounter (HOSPITAL_COMMUNITY): Payer: Self-pay | Admitting: Internal Medicine

## 2014-05-06 ENCOUNTER — Emergency Department (HOSPITAL_COMMUNITY): Payer: Medicare PPO

## 2014-05-06 DIAGNOSIS — I10 Essential (primary) hypertension: Secondary | ICD-10-CM

## 2014-05-06 DIAGNOSIS — W19XXXA Unspecified fall, initial encounter: Secondary | ICD-10-CM

## 2014-05-06 DIAGNOSIS — Z885 Allergy status to narcotic agent status: Secondary | ICD-10-CM | POA: Diagnosis not present

## 2014-05-06 DIAGNOSIS — K219 Gastro-esophageal reflux disease without esophagitis: Secondary | ICD-10-CM | POA: Diagnosis present

## 2014-05-06 DIAGNOSIS — N390 Urinary tract infection, site not specified: Secondary | ICD-10-CM

## 2014-05-06 DIAGNOSIS — F17210 Nicotine dependence, cigarettes, uncomplicated: Secondary | ICD-10-CM | POA: Diagnosis present

## 2014-05-06 DIAGNOSIS — Z7901 Long term (current) use of anticoagulants: Secondary | ICD-10-CM

## 2014-05-06 DIAGNOSIS — Z8673 Personal history of transient ischemic attack (TIA), and cerebral infarction without residual deficits: Secondary | ICD-10-CM | POA: Diagnosis not present

## 2014-05-06 DIAGNOSIS — F419 Anxiety disorder, unspecified: Secondary | ICD-10-CM | POA: Diagnosis present

## 2014-05-06 DIAGNOSIS — I2510 Atherosclerotic heart disease of native coronary artery without angina pectoris: Secondary | ICD-10-CM | POA: Diagnosis present

## 2014-05-06 DIAGNOSIS — F0390 Unspecified dementia without behavioral disturbance: Secondary | ICD-10-CM | POA: Diagnosis present

## 2014-05-06 DIAGNOSIS — I340 Nonrheumatic mitral (valve) insufficiency: Secondary | ICD-10-CM | POA: Diagnosis present

## 2014-05-06 DIAGNOSIS — Y92009 Unspecified place in unspecified non-institutional (private) residence as the place of occurrence of the external cause: Secondary | ICD-10-CM

## 2014-05-06 DIAGNOSIS — B9689 Other specified bacterial agents as the cause of diseases classified elsewhere: Secondary | ICD-10-CM

## 2014-05-06 DIAGNOSIS — F329 Major depressive disorder, single episode, unspecified: Secondary | ICD-10-CM | POA: Diagnosis present

## 2014-05-06 DIAGNOSIS — Z952 Presence of prosthetic heart valve: Secondary | ICD-10-CM

## 2014-05-06 DIAGNOSIS — D72829 Elevated white blood cell count, unspecified: Secondary | ICD-10-CM | POA: Diagnosis present

## 2014-05-06 DIAGNOSIS — S72002A Fracture of unspecified part of neck of left femur, initial encounter for closed fracture: Secondary | ICD-10-CM | POA: Diagnosis present

## 2014-05-06 DIAGNOSIS — I480 Paroxysmal atrial fibrillation: Secondary | ICD-10-CM

## 2014-05-06 DIAGNOSIS — I4892 Unspecified atrial flutter: Secondary | ICD-10-CM | POA: Diagnosis present

## 2014-05-06 DIAGNOSIS — Y9389 Activity, other specified: Secondary | ICD-10-CM | POA: Diagnosis not present

## 2014-05-06 DIAGNOSIS — M25552 Pain in left hip: Secondary | ICD-10-CM | POA: Diagnosis present

## 2014-05-06 DIAGNOSIS — G4700 Insomnia, unspecified: Secondary | ICD-10-CM | POA: Diagnosis present

## 2014-05-06 DIAGNOSIS — E785 Hyperlipidemia, unspecified: Secondary | ICD-10-CM | POA: Diagnosis present

## 2014-05-06 DIAGNOSIS — I6529 Occlusion and stenosis of unspecified carotid artery: Secondary | ICD-10-CM | POA: Diagnosis present

## 2014-05-06 LAB — BASIC METABOLIC PANEL WITH GFR
Anion gap: 5 (ref 5–15)
BUN: 11 mg/dL (ref 6–23)
CO2: 27 mmol/L (ref 19–32)
Calcium: 9.1 mg/dL (ref 8.4–10.5)
Chloride: 104 meq/L (ref 96–112)
Creatinine, Ser: 0.76 mg/dL (ref 0.50–1.10)
GFR calc Af Amer: >90 mL/min
GFR calc non Af Amer: 82 mL/min — ABNORMAL LOW
Glucose, Bld: 160 mg/dL — ABNORMAL HIGH (ref 70–99)
Potassium: 4.4 mmol/L (ref 3.5–5.1)
Sodium: 136 mmol/L (ref 135–145)

## 2014-05-06 LAB — URINE MICROSCOPIC-ADD ON

## 2014-05-06 LAB — CBC
HCT: 44.5 % (ref 36.0–46.0)
Hemoglobin: 15 g/dL (ref 12.0–15.0)
MCH: 29.9 pg (ref 26.0–34.0)
MCHC: 33.7 g/dL (ref 30.0–36.0)
MCV: 88.8 fL (ref 78.0–100.0)
Platelets: 193 K/uL (ref 150–400)
RBC: 5.01 MIL/uL (ref 3.87–5.11)
RDW: 13 % (ref 11.5–15.5)
WBC: 14.8 K/uL — ABNORMAL HIGH (ref 4.0–10.5)

## 2014-05-06 LAB — URINALYSIS, ROUTINE W REFLEX MICROSCOPIC
Bilirubin Urine: NEGATIVE
GLUCOSE, UA: NEGATIVE mg/dL
Ketones, ur: 15 mg/dL — AB
Nitrite: POSITIVE — AB
PH: 6 (ref 5.0–8.0)
Protein, ur: NEGATIVE mg/dL
SPECIFIC GRAVITY, URINE: 1.02 (ref 1.005–1.030)
UROBILINOGEN UA: 0.2 mg/dL (ref 0.0–1.0)

## 2014-05-06 LAB — PROTIME-INR
INR: 3.12 — ABNORMAL HIGH (ref 0.00–1.49)
Prothrombin Time: 32.4 s — ABNORMAL HIGH (ref 11.6–15.2)

## 2014-05-06 MED ORDER — CYANOCOBALAMIN 500 MCG PO TABS
500.0000 ug | ORAL_TABLET | Freq: Every day | ORAL | Status: DC
  Administered 2014-05-06 – 2014-05-11 (×6): 500 ug via ORAL
  Filled 2014-05-06 (×6): qty 1

## 2014-05-06 MED ORDER — LISINOPRIL 10 MG PO TABS
10.0000 mg | ORAL_TABLET | Freq: Every day | ORAL | Status: DC
  Administered 2014-05-06 – 2014-05-11 (×6): 10 mg via ORAL
  Filled 2014-05-06 (×6): qty 1

## 2014-05-06 MED ORDER — ACETAMINOPHEN 325 MG PO TABS
650.0000 mg | ORAL_TABLET | Freq: Four times a day (QID) | ORAL | Status: DC | PRN
  Administered 2014-05-06 – 2014-05-09 (×3): 650 mg via ORAL
  Filled 2014-05-06 (×3): qty 2

## 2014-05-06 MED ORDER — ASPIRIN EC 81 MG PO TBEC
81.0000 mg | DELAYED_RELEASE_TABLET | Freq: Every day | ORAL | Status: DC
  Administered 2014-05-06: 81 mg via ORAL
  Filled 2014-05-06 (×2): qty 1

## 2014-05-06 MED ORDER — HYDROMORPHONE HCL 1 MG/ML IJ SOLN

## 2014-05-06 MED ORDER — METHOCARBAMOL 500 MG PO TABS
500.0000 mg | ORAL_TABLET | Freq: Three times a day (TID) | ORAL | Status: DC | PRN
  Administered 2014-05-06 – 2014-05-07 (×2): 500 mg via ORAL
  Filled 2014-05-06 (×2): qty 1

## 2014-05-06 MED ORDER — ESCITALOPRAM OXALATE 20 MG PO TABS
20.0000 mg | ORAL_TABLET | Freq: Every day | ORAL | Status: DC
  Administered 2014-05-06 – 2014-05-11 (×6): 20 mg via ORAL
  Filled 2014-05-06 (×6): qty 1

## 2014-05-06 MED ORDER — WHITE PETROLATUM GEL
Status: AC
  Administered 2014-05-06: 03:00:00
  Filled 2014-05-06: qty 5

## 2014-05-06 MED ORDER — ATORVASTATIN CALCIUM 20 MG PO TABS
20.0000 mg | ORAL_TABLET | Freq: Every day | ORAL | Status: DC
  Administered 2014-05-06 – 2014-05-11 (×6): 20 mg via ORAL
  Filled 2014-05-06 (×6): qty 1

## 2014-05-06 MED ORDER — BUPROPION HCL 75 MG PO TABS
75.0000 mg | ORAL_TABLET | Freq: Every day | ORAL | Status: DC
  Administered 2014-05-06 – 2014-05-11 (×6): 75 mg via ORAL
  Filled 2014-05-06 (×6): qty 1

## 2014-05-06 MED ORDER — METOPROLOL TARTRATE 50 MG PO TABS
75.0000 mg | ORAL_TABLET | Freq: Two times a day (BID) | ORAL | Status: DC
  Administered 2014-05-06 – 2014-05-11 (×11): 75 mg via ORAL
  Filled 2014-05-06 (×14): qty 1

## 2014-05-06 MED ORDER — CEFTRIAXONE SODIUM IN DEXTROSE 20 MG/ML IV SOLN
1.0000 g | Freq: Every day | INTRAVENOUS | Status: DC
  Administered 2014-05-06 – 2014-05-08 (×3): 1 g via INTRAVENOUS
  Filled 2014-05-06 (×4): qty 50

## 2014-05-06 MED ORDER — TEMAZEPAM 15 MG PO CAPS
30.0000 mg | ORAL_CAPSULE | Freq: Every day | ORAL | Status: DC
  Administered 2014-05-06 – 2014-05-10 (×5): 30 mg via ORAL
  Filled 2014-05-06 (×5): qty 2

## 2014-05-06 MED ORDER — ACETAMINOPHEN 650 MG RE SUPP: 650.0000 mg | Freq: Four times a day (QID) | RECTAL | Status: DC | PRN

## 2014-05-06 MED ORDER — PHYTONADIONE 5 MG PO TABS
5.0000 mg | ORAL_TABLET | Freq: Once | ORAL | Status: AC
  Administered 2014-05-06: 5 mg via ORAL
  Filled 2014-05-06: qty 1

## 2014-05-06 NOTE — ED Notes (Signed)
Attempted urination on the bedpan. Pt unable to go at this time.

## 2014-05-06 NOTE — Progress Notes (Signed)
ANTICOAGULATION CONSULT NOTE - Initial Consult  Pharmacy Consult for Heparin (while warfarin on hold) Indication: atrial fibrillation  Allergies  Allergen Reactions  . Codeine Other (See Comments)    Patient Measurements: Height: 5\' 7"  (170.2 cm) Weight: 135 lb (61.236 kg) IBW/kg (Calculated) : 61.6  Vital Signs: Temp: 97.7 F (36.5 C) (12/24 2122) Temp Source: Oral (12/24 2122) BP: 143/57 mmHg (12/24 2345) Pulse Rate: 72 (12/24 2345)  Labs:  Recent Labs  05/05/14 2142  HGB 15.4*  HCT 44.7  PLT 215  LABPROT 29.8*  INR 2.81*  CREATININE 0.72    Estimated Creatinine Clearance: 60.5 mL/min (by C-G formula based on Cr of 0.72).   Medical History: Past Medical History  Diagnosis Date  . CAD (coronary artery disease) 08/2005    non-obstructive by cath  . Atrial fibrillation     on chronic coumadin  . Depression   . Atrial flutter     s/p RFA x 2  . Mitral regurgitation     S/P Bjork Shiley mechanical mitral valve replacement in 1980  . Gastroesophageal reflux disease   . TIA (transient ischemic attack)   . HTN (hypertension)   . Anxiety   . HLD (hyperlipidemia)   . Carotid stenosis     a. Carotid US (9/14):  Bilat 1-39% => f/u PRN   Assessment: 73 y/o F to to start heparin while warfarin on hold for left femur/hip fracture. INR 2.81 (giving vit K 5 mg), CBC/renal function good.   Goal of Therapy:  Heparin level 0.3-0.7 units/ml Monitor platelets by anticoagulation protocol: Yes   Plan:  -Daily PT/INR -Start heparin when INR <2  Abran DukeLedford, Derric Dealmeida 05/06/2014,12:30 AM

## 2014-05-06 NOTE — Progress Notes (Signed)
Notified MD regarding positive UA.  No orders at this time.

## 2014-05-06 NOTE — Consult Note (Addendum)
Reason for Consult:fall with left closed displaced femoral neck fracture Referring Physician: Dareen Piano MD  [name] [name] is an 73 y.o. female.  HPI: 73 yo female with fall and left angulated displaced femoral neck fracture. Multiple medical problems. Lives with husband , community ambulator with assistive devices. No previous Hx of hip injury. Neg LOC  Past Medical History  Diagnosis Date  . CAD (coronary artery disease) 08/2005    non-obstructive by cath  . Atrial fibrillation     on chronic coumadin  . Depression   . Atrial flutter     s/p RFA x 2  . Mitral regurgitation     S/P Bjork Shiley mechanical mitral valve replacement in 1980  . Gastroesophageal reflux disease   . TIA (transient ischemic attack)   . HTN (hypertension)   . Anxiety   . HLD (hyperlipidemia)   . Carotid stenosis     a. Carotid US (9/14):  Bilat 1-39% => f/u PRN    Past Surgical History  Procedure Laterality Date  . A flutter ablation      x 2  . Mitral valve replacement      Bjork-Shiley valve placed for mitral regurgitation    Family History  Problem Relation Age of Onset  . Heart attack Other     Social History:  reports that she has been smoking Cigarettes.  She has been smoking about 2.00 packs per day. She uses smokeless tobacco. She reports that she does not drink alcohol or use illicit drugs.  Allergies:  Allergies  Allergen Reactions  . Codeine Other (See Comments)    Medications: I have reviewed the patient's current medications.  Results for orders placed or performed during the hospital encounter of 05/05/14 (from the past 48 hour(s))  Basic metabolic panel     Status: Abnormal   Collection Time: 05/05/14  9:42 PM  Result Value Ref Range   Sodium 135 135 - 145 mmol/L    Comment: Please note change in reference range.   Potassium 4.1 3.5 - 5.1 mmol/L    Comment: Please note change in reference range.   Chloride 102 96 - 112 mEq/L   CO2 24 19 - 32 mmol/L   Glucose, Bld 122  (H) 70 - 99 mg/dL   BUN 10 6 - 23 mg/dL   Creatinine, Ser 0.72 0.50 - 1.10 mg/dL   Calcium 9.3 8.4 - 10.5 mg/dL   GFR calc non Af Amer 83 (L) >90 mL/min   GFR calc Af Amer >90 >90 mL/min    Comment: (NOTE) The eGFR has been calculated using the CKD EPI equation. This calculation has not been validated in all clinical situations. eGFR's persistently <90 mL/min signify possible Chronic Kidney Disease.    Anion gap 9 5 - 15  CBC with Differential     Status: Abnormal   Collection Time: 05/05/14  9:42 PM  Result Value Ref Range   WBC 17.1 (H) 4.0 - 10.5 K/uL   RBC 5.06 3.87 - 5.11 MIL/uL   Hemoglobin 15.4 (H) 12.0 - 15.0 g/dL   HCT 44.7 36.0 - 46.0 %   MCV 88.3 78.0 - 100.0 fL   MCH 30.4 26.0 - 34.0 pg   MCHC 34.5 30.0 - 36.0 g/dL   RDW 12.9 11.5 - 15.5 %   Platelets 215 150 - 400 K/uL   Neutrophils Relative % 85 (H) 43 - 77 %   Neutro Abs 14.6 (H) 1.7 - 7.7 K/uL   Lymphocytes Relative 9 (L)  12 - 46 %   Lymphs Abs 1.5 0.7 - 4.0 K/uL   Monocytes Relative 5 3 - 12 %   Monocytes Absolute 0.8 0.1 - 1.0 K/uL   Eosinophils Relative 1 0 - 5 %   Eosinophils Absolute 0.1 0.0 - 0.7 K/uL   Basophils Relative 0 0 - 1 %   Basophils Absolute 0.0 0.0 - 0.1 K/uL  Protime-INR     Status: Abnormal   Collection Time: 05/05/14  9:42 PM  Result Value Ref Range   Prothrombin Time 29.8 (H) 11.6 - 15.2 seconds   INR 2.81 (H) 0.00 - 1.49  Urinalysis, Routine w reflex microscopic     Status: Abnormal   Collection Time: 05/06/14  2:31 AM  Result Value Ref Range   Color, Urine YELLOW YELLOW   APPearance CLOUDY (A) CLEAR   Specific Gravity, Urine 1.020 1.005 - 1.030   pH 6.0 5.0 - 8.0   Glucose, UA NEGATIVE NEGATIVE mg/dL   Hgb urine dipstick SMALL (A) NEGATIVE   Bilirubin Urine NEGATIVE NEGATIVE   Ketones, ur 15 (A) NEGATIVE mg/dL   Protein, ur NEGATIVE NEGATIVE mg/dL   Urobilinogen, UA 0.2 0.0 - 1.0 mg/dL   Nitrite POSITIVE (A) NEGATIVE   Leukocytes, UA MODERATE (A) NEGATIVE  Urine  microscopic-add on     Status: Abnormal   Collection Time: 05/06/14  2:31 AM  Result Value Ref Range   Squamous Epithelial / LPF RARE RARE   WBC, UA TOO NUMEROUS TO COUNT <3 WBC/hpf   RBC / HPF 3-6 <3 RBC/hpf   Bacteria, UA MANY (A) RARE  Protime-INR     Status: Abnormal   Collection Time: 05/06/14  5:35 AM  Result Value Ref Range   Prothrombin Time 32.4 (H) 11.6 - 15.2 seconds   INR 3.12 (H) 0.00 - 7.89  Basic metabolic panel     Status: Abnormal   Collection Time: 05/06/14  5:35 AM  Result Value Ref Range   Sodium 136 135 - 145 mmol/L    Comment: Please note change in reference range.   Potassium 4.4 3.5 - 5.1 mmol/L    Comment: Please note change in reference range.   Chloride 104 96 - 112 mEq/L   CO2 27 19 - 32 mmol/L   Glucose, Bld 160 (H) 70 - 99 mg/dL   BUN 11 6 - 23 mg/dL   Creatinine, Ser 0.76 0.50 - 1.10 mg/dL   Calcium 9.1 8.4 - 10.5 mg/dL   GFR calc non Af Amer 82 (L) >90 mL/min   GFR calc Af Amer >90 >90 mL/min    Comment: (NOTE) The eGFR has been calculated using the CKD EPI equation. This calculation has not been validated in all clinical situations. eGFR's persistently <90 mL/min signify possible Chronic Kidney Disease.    Anion gap 5 5 - 15  CBC     Status: Abnormal   Collection Time: 05/06/14  5:35 AM  Result Value Ref Range   WBC 14.8 (H) 4.0 - 10.5 K/uL   RBC 5.01 3.87 - 5.11 MIL/uL   Hemoglobin 15.0 12.0 - 15.0 g/dL   HCT 44.5 36.0 - 46.0 %   MCV 88.8 78.0 - 100.0 fL   MCH 29.9 26.0 - 34.0 pg   MCHC 33.7 30.0 - 36.0 g/dL   RDW 13.0 11.5 - 15.5 %   Platelets 193 150 - 400 K/uL    Dg Hip Complete Left  05/05/2014   CLINICAL DATA:  Status post fall. Acute onset of  left hip pain and shortening. Initial encounter.  EXAM: LEFT HIP - COMPLETE 2+ VIEW  COMPARISON:  None.  FINDINGS: There is a mildly displaced subcapital fracture through the left femoral neck, with shortening at the fracture site. The left femoral head remains seated at the acetabulum. No  additional fractures are seen. The right hip joint is unremarkable in appearance.  Mild degenerative change is noted at the lower lumbar spine, with mild rotatory scoliosis. The sacroiliac joints are grossly unremarkable. The visualized bowel gas pattern is within normal limits.  IMPRESSION: Mildly displaced subcapital fracture through the left femoral neck, with shortening at the fracture site.   Electronically Signed   By: Garald Balding M.D.   On: 05/05/2014 22:54   Dg Femur Left  05/05/2014   CLINICAL DATA:  Status post fall. Acute onset of left hip pain and shortening. Initial encounter.  EXAM: LEFT FEMUR - 2 VIEW  COMPARISON:  CT of the abdomen and pelvis performed 04/08/2009  FINDINGS: There is a mildly displaced subcapital fracture through the left femoral neck. No additional fractures are seen. The distal femur appears intact. The knee joint is grossly unremarkable. No knee joint effusion is identified. No significant soft tissue abnormalities are seen.  IMPRESSION: Mildly displaced subcapital fracture through the left femoral neck.   Electronically Signed   By: Garald Balding M.D.   On: 05/05/2014 22:53   Ct Head Wo Contrast  05/06/2014   CLINICAL DATA:  Fall  EXAM: CT HEAD WITHOUT CONTRAST  TECHNIQUE: Contiguous axial images were obtained from the base of the skull through the vertex without intravenous contrast.  COMPARISON:  04/08/2009  FINDINGS: Mild cortical volume loss noted with proportional ventricular prominence. Areas of periventricular white matter hypodensity are most compatible with small vessel ischemic change. No acute hemorrhage, infarct, or mass lesion is identified. No midline shift. Moderate right maxillary sinusitis. No skull fracture.  IMPRESSION: No acute intracranial finding.  Chronic findings as above.  Moderate right maxillary sinusitis.   Electronically Signed   By: Conchita Paris M.D.   On: 05/06/2014 01:43    Review of Systems  Constitutional: Negative for fever,  chills and weight loss.  Respiratory: Negative for cough.   Cardiovascular: Negative for chest pain.       MVR, on chronic coumadin  Genitourinary: Positive for urgency and frequency.  Musculoskeletal: Negative for myalgias.  Skin: Positive for rash.  Neurological: Negative for dizziness, sensory change and headaches.  Endo/Heme/Allergies: Bruises/bleeds easily.  Psychiatric/Behavioral: Negative for suicidal ideas.   Blood pressure 198/68, pulse 87, temperature 98.3 F (36.8 C), temperature source Oral, resp. rate 18, height $RemoveBe'5\' 7"'ycmnCmEks$  (1.702 m), weight 62.234 kg (137 lb 3.2 oz), SpO2 95 %. Physical Exam  Constitutional: She appears well-developed and well-nourished.  Cardiovascular:  Mechanical valve murmur. IR IR pulse  Respiratory: Effort normal. No respiratory distress. She has no wheezes.  GI: Soft.  Musculoskeletal:  Left hip short and ER pulses intact  Psychiatric:  Slow with some responses ? Due to pain meds for hip pain. Judgement , good. O times 3    Assessment/Plan: Left femoral neck fracture, for correction of protime to less than 2 and hopeful surgery Saturday afternoon. ( about 5 PM)  Peytin Dechert C 05/06/2014, 8:59 AM

## 2014-05-06 NOTE — Progress Notes (Signed)
Subjective: Pt does not know what year or day it is. Complaining of left hip pain. Denies dysuria.   Objective: Vital signs in last 24 hours: Filed Vitals:   05/06/14 0131 05/06/14 0140 05/06/14 0200 05/06/14 0707  BP: 173/71  185/66 198/68  Pulse: 74  76 87  Temp:  98 F (36.7 C) 97.5 F (36.4 C) 98.3 F (36.8 C)  TempSrc:  Oral Oral Oral  Resp:   18 18  Height:      Weight:   137 lb 3.2 oz (62.234 kg)   SpO2: 94%  94% 95%   Weight change:   Intake/Output Summary (Last 24 hours) at 05/06/14 0930 Last data filed at 05/06/14 0708  Gross per 24 hour  Intake      0 ml  Output    400 ml  Net   -400 ml   General: NAD, diaphoretic HEENT: moist mucous membranes Cardiac: RRR, no murmurs Lungs: CTAB GI: active bowel sounsd Ext: left leg shortened and externally rotated, no bruising noted on left  Hip  Lab Results: Basic Metabolic Panel:  Recent Labs Lab 05/05/14 2142 05/06/14 0535  NA 135 136  K 4.1 4.4  CL 102 104  CO2 24 27  GLUCOSE 122* 160*  BUN 10 11  CREATININE 0.72 0.76  CALCIUM 9.3 9.1   CBC:  Recent Labs Lab 05/05/14 2142 05/06/14 0535  WBC 17.1* 14.8*  NEUTROABS 14.6*  --   HGB 15.4* 15.0  HCT 44.7 44.5  MCV 88.3 88.8  PLT 215 193   Coagulation:  Recent Labs Lab 05/05/14 2142 05/06/14 0535  LABPROT 29.8* 32.4*  INR 2.81* 3.12*   Urinalysis:  Recent Labs Lab 05/06/14 0231  COLORURINE YELLOW  LABSPEC 1.020  PHURINE 6.0  GLUCOSEU NEGATIVE  HGBUR SMALL*  BILIRUBINUR NEGATIVE  KETONESUR 15*  PROTEINUR NEGATIVE  UROBILINOGEN 0.2  NITRITE POSITIVE*  LEUKOCYTESUR MODERATE*    Studies/Results: Dg Hip Complete Left  05/05/2014   CLINICAL DATA:  Status post fall. Acute onset of left hip pain and shortening. Initial encounter.  EXAM: LEFT HIP - COMPLETE 2+ VIEW  COMPARISON:  None.  FINDINGS: There is a mildly displaced subcapital fracture through the left femoral neck, with shortening at the fracture site. The left femoral head  remains seated at the acetabulum. No additional fractures are seen. The right hip joint is unremarkable in appearance.  Mild degenerative change is noted at the lower lumbar spine, with mild rotatory scoliosis. The sacroiliac joints are grossly unremarkable. The visualized bowel gas pattern is within normal limits.  IMPRESSION: Mildly displaced subcapital fracture through the left femoral neck, with shortening at the fracture site.   Electronically Signed   By: Roanna RaiderJeffery  Chang M.D.   On: 05/05/2014 22:54   Dg Femur Left  05/05/2014   CLINICAL DATA:  Status post fall. Acute onset of left hip pain and shortening. Initial encounter.  EXAM: LEFT FEMUR - 2 VIEW  COMPARISON:  CT of the abdomen and pelvis performed 04/08/2009  FINDINGS: There is a mildly displaced subcapital fracture through the left femoral neck. No additional fractures are seen. The distal femur appears intact. The knee joint is grossly unremarkable. No knee joint effusion is identified. No significant soft tissue abnormalities are seen.  IMPRESSION: Mildly displaced subcapital fracture through the left femoral neck.   Electronically Signed   By: Roanna RaiderJeffery  Chang M.D.   On: 05/05/2014 22:53   Ct Head Wo Contrast  05/06/2014   CLINICAL DATA:  Fall  EXAM: CT HEAD WITHOUT CONTRAST  TECHNIQUE: Contiguous axial images were obtained from the base of the skull through the vertex without intravenous contrast.  COMPARISON:  04/08/2009  FINDINGS: Mild cortical volume loss noted with proportional ventricular prominence. Areas of periventricular white matter hypodensity are most compatible with small vessel ischemic change. No acute hemorrhage, infarct, or mass lesion is identified. No midline shift. Moderate right maxillary sinusitis. No skull fracture.  IMPRESSION: No acute intracranial finding.  Chronic findings as above.  Moderate right maxillary sinusitis.   Electronically Signed   By: Christiana PellantGretchen  Green M.D.   On: 05/06/2014 01:43   Medications: I have  reviewed the patient's current medications. Scheduled Meds: . aspirin EC  81 mg Oral Daily  . atorvastatin  20 mg Oral Daily  . buPROPion  75 mg Oral Daily  . cefTRIAXone (ROCEPHIN)  IV  1 g Intravenous Daily  . cyanocobalamin  500 mcg Oral Daily  . escitalopram  20 mg Oral Daily  . lisinopril  10 mg Oral Daily  . metoprolol  75 mg Oral BID  . temazepam  30 mg Oral QHS   Continuous Infusions:  PRN Meds:.acetaminophen **OR** acetaminophen, HYDROmorphone (DILAUDID) injection, methocarbamol Assessment/Plan: Principal Problem:   Displaced fracture of left femoral neck Active Problems:   Essential hypertension, benign   Atrial fibrillation   MITRAL VALVE REPLACEMENT, HX OF   Dementia   Leukocytosis   Fracture of femoral neck, left   Left Femoral neck fracture-  -  Dr. Ophelia CharterYates will take patient to OR around 4-5pm tomorrow.  - Will continue to treat her pain in the mean time with dialudid 0.5mg  q4hr PRN - ordered vitamin K 5mg . INR 3.12 today.  - will bridge her with IV heparin per pharmacy   UTI-- asymptomatic. UA positive for TNTC WBC, nitrites, LE, and many bacteria. Likely cause of leukocytosis.  - urine culture pending - rocephin IV  MR s/p mitral valve replacement 1980 - on coumadin, INR goal 2.5-3.5. Recently seen by cards 01/2014.  -held coumadin and reverse with vitamin K as above.   Paroxysmal Afib - on coumadin at home.  - held coumadin as above, bridge with IV heparin - cont metoprolol.  HTN - elevated overnight.  At home takes lisinopril 10mg , metoprolol 75mg  BID - will continue home meds, will continue to monitor   Depression - cont lexapro Anxiety - cont bupropion Insomnia - cont restoril.    Dispo: Disposition is deferred at this time, awaiting improvement of current medical problems. Anticipated discharge in approximately 2 day(s).   The patient does have a current PCP Lorie Phenix(Nancy Maloney, MD) and [facility]  [facility]charge.  The patient does have transportation limitations that hinder transportation to clinic appointments.  .Services Needed at time of discharge: Y = Yes, Blank = No PT:   OT:   RN:   Equipment:   Other:     LOS: 1 day   Gara Kroneriana Boysie Bonebrake, MD 05/06/2014, 9:30 AM

## 2014-05-07 ENCOUNTER — Encounter (HOSPITAL_COMMUNITY)
Admission: EM | Disposition: A | Discharge status: Skilled Nursing Facility | Payer: Self-pay | Source: Home / Self Care | Attending: Internal Medicine

## 2014-05-07 ENCOUNTER — Inpatient Hospital Stay (HOSPITAL_COMMUNITY): Payer: Medicare PPO

## 2014-05-07 ENCOUNTER — Inpatient Hospital Stay (HOSPITAL_COMMUNITY): Payer: Medicare PPO | Admitting: Certified Registered"

## 2014-05-07 HISTORY — PX: HIP ARTHROPLASTY: SHX981

## 2014-05-07 LAB — PROTIME-INR
INR: 1.67 — ABNORMAL HIGH (ref 0.00–1.49)
Prothrombin Time: 19.9 seconds — ABNORMAL HIGH (ref 11.6–15.2)

## 2014-05-07 LAB — CBC WITH DIFFERENTIAL/PLATELET
BASOS ABS: 0 10*3/uL (ref 0.0–0.1)
Basophils Relative: 0 % (ref 0–1)
Eosinophils Absolute: 0.1 10*3/uL (ref 0.0–0.7)
Eosinophils Relative: 1 % (ref 0–5)
HEMATOCRIT: 41.6 % (ref 36.0–46.0)
Hemoglobin: 14 g/dL (ref 12.0–15.0)
LYMPHS PCT: 8 % — AB (ref 12–46)
Lymphs Abs: 1.3 10*3/uL (ref 0.7–4.0)
MCH: 30 pg (ref 26.0–34.0)
MCHC: 33.7 g/dL (ref 30.0–36.0)
MCV: 89.1 fL (ref 78.0–100.0)
Monocytes Absolute: 1 10*3/uL (ref 0.1–1.0)
Monocytes Relative: 6 % (ref 3–12)
Neutro Abs: 13.1 10*3/uL — ABNORMAL HIGH (ref 1.7–7.7)
Neutrophils Relative %: 85 % — ABNORMAL HIGH (ref 43–77)
PLATELETS: 184 10*3/uL (ref 150–400)
RBC: 4.67 MIL/uL (ref 3.87–5.11)
RDW: 13.3 % (ref 11.5–15.5)
WBC: 15.5 10*3/uL — AB (ref 4.0–10.5)

## 2014-05-07 LAB — SURGICAL PCR SCREEN
MRSA, PCR: NEGATIVE
Staphylococcus aureus: NEGATIVE

## 2014-05-07 SURGERY — HEMIARTHROPLASTY, HIP, DIRECT ANTERIOR APPROACH, FOR FRACTURE
Anesthesia: General | Laterality: Left

## 2014-05-07 MED ORDER — DEXAMETHASONE SODIUM PHOSPHATE 4 MG/ML IJ SOLN
INTRAMUSCULAR | Status: DC | PRN
  Administered 2014-05-07: 4 mg via INTRAVENOUS

## 2014-05-07 MED ORDER — ONDANSETRON HCL 4 MG/2ML IJ SOLN
INTRAMUSCULAR | Status: DC | PRN
  Administered 2014-05-07: 4 mg via INTRAVENOUS

## 2014-05-07 MED ORDER — ONDANSETRON HCL 4 MG/2ML IJ SOLN: 4.0000 mg | Freq: Once | INTRAMUSCULAR | Status: DC | PRN

## 2014-05-07 MED ORDER — NEOSTIGMINE METHYLSULFATE 10 MG/10ML IV SOLN
INTRAVENOUS | Status: DC | PRN
  Administered 2014-05-07: 1.5 mg via INTRAVENOUS

## 2014-05-07 MED ORDER — DEXTROSE-NACL 5-0.45 % IV SOLN
INTRAVENOUS | Status: DC
  Administered 2014-05-08: 02:00:00 via INTRAVENOUS

## 2014-05-07 MED ORDER — GLYCOPYRROLATE 0.2 MG/ML IJ SOLN
INTRAMUSCULAR | Status: DC | PRN
  Administered 2014-05-07: 0.2 mg via INTRAVENOUS

## 2014-05-07 MED ORDER — MENTHOL 3 MG MT LOZG: 1.0000 | LOZENGE | OROMUCOSAL | Status: DC | PRN

## 2014-05-07 MED ORDER — GLYCOPYRROLATE 0.2 MG/ML IJ SOLN
INTRAMUSCULAR | Status: AC
  Filled 2014-05-07: qty 1

## 2014-05-07 MED ORDER — MORPHINE SULFATE 2 MG/ML IJ SOLN: 0.5000 mg | INTRAMUSCULAR | Status: DC | PRN

## 2014-05-07 MED ORDER — LORAZEPAM 0.5 MG PO TABS
0.5000 mg | ORAL_TABLET | Freq: Four times a day (QID) | ORAL | Status: DC | PRN
  Administered 2014-05-07: 1 mg via ORAL
  Administered 2014-05-08: 0.5 mg via ORAL
  Administered 2014-05-10: 1 mg via ORAL
  Filled 2014-05-07: qty 2
  Filled 2014-05-07: qty 1
  Filled 2014-05-07 (×2): qty 2

## 2014-05-07 MED ORDER — ONDANSETRON HCL 4 MG PO TABS: 4.0000 mg | ORAL_TABLET | Freq: Four times a day (QID) | ORAL | Status: DC | PRN

## 2014-05-07 MED ORDER — WARFARIN - PHARMACIST DOSING INPATIENT: Freq: Every day | Status: DC

## 2014-05-07 MED ORDER — LACTATED RINGERS IV SOLN
INTRAVENOUS | Status: DC
  Administered 2014-05-07: 17:00:00 via INTRAVENOUS

## 2014-05-07 MED ORDER — METOCLOPRAMIDE HCL 5 MG/ML IJ SOLN: 5.0000 mg | Freq: Three times a day (TID) | INTRAMUSCULAR | Status: DC | PRN

## 2014-05-07 MED ORDER — FENTANYL CITRATE 0.05 MG/ML IJ SOLN
INTRAMUSCULAR | Status: AC
  Filled 2014-05-07: qty 5

## 2014-05-07 MED ORDER — SODIUM CHLORIDE 0.9 % IR SOLN
Status: DC | PRN
  Administered 2014-05-07: 1000 mL

## 2014-05-07 MED ORDER — DEXAMETHASONE SODIUM PHOSPHATE 4 MG/ML IJ SOLN
INTRAMUSCULAR | Status: AC
  Filled 2014-05-07: qty 1

## 2014-05-07 MED ORDER — ROCURONIUM BROMIDE 50 MG/5ML IV SOLN
INTRAVENOUS | Status: AC
  Filled 2014-05-07: qty 1

## 2014-05-07 MED ORDER — BUPIVACAINE HCL (PF) 0.25 % IJ SOLN
INTRAMUSCULAR | Status: DC | PRN
  Administered 2014-05-07: 10 mL

## 2014-05-07 MED ORDER — LIDOCAINE HCL (CARDIAC) 20 MG/ML IV SOLN
INTRAVENOUS | Status: AC
  Filled 2014-05-07: qty 5

## 2014-05-07 MED ORDER — HEPARIN (PORCINE) IN NACL 100-0.45 UNIT/ML-% IJ SOLN
950.0000 [IU]/h | INTRAMUSCULAR | Status: DC
  Filled 2014-05-07: qty 250

## 2014-05-07 MED ORDER — ROCURONIUM BROMIDE 100 MG/10ML IV SOLN
INTRAVENOUS | Status: DC | PRN
  Administered 2014-05-07: 35 mg via INTRAVENOUS
  Administered 2014-05-07: 10 mg via INTRAVENOUS

## 2014-05-07 MED ORDER — NEOSTIGMINE METHYLSULFATE 10 MG/10ML IV SOLN
INTRAVENOUS | Status: AC
  Filled 2014-05-07: qty 1

## 2014-05-07 MED ORDER — HEPARIN (PORCINE) IN NACL 100-0.45 UNIT/ML-% IJ SOLN
950.0000 [IU]/h | INTRAMUSCULAR | Status: DC
  Administered 2014-05-07: 950 [IU]/h via INTRAVENOUS
  Filled 2014-05-07: qty 250

## 2014-05-07 MED ORDER — ONDANSETRON HCL 4 MG/2ML IJ SOLN: 4.0000 mg | Freq: Four times a day (QID) | INTRAMUSCULAR | Status: DC | PRN

## 2014-05-07 MED ORDER — PROPOFOL 10 MG/ML IV BOLUS
INTRAVENOUS | Status: DC | PRN
  Administered 2014-05-07: 100 mg via INTRAVENOUS

## 2014-05-07 MED ORDER — FENTANYL CITRATE 0.05 MG/ML IJ SOLN
INTRAMUSCULAR | Status: DC | PRN
Start: 2014-05-07 — End: 2014-05-07
  Administered 2014-05-07 (×5): 50 ug via INTRAVENOUS

## 2014-05-07 MED ORDER — ONDANSETRON HCL 4 MG/2ML IJ SOLN
INTRAMUSCULAR | Status: AC
  Filled 2014-05-07: qty 2

## 2014-05-07 MED ORDER — DOCUSATE SODIUM 100 MG PO CAPS
100.0000 mg | ORAL_CAPSULE | Freq: Two times a day (BID) | ORAL | Status: DC
Start: 2014-05-07 — End: 2014-05-11
  Administered 2014-05-07 – 2014-05-11 (×8): 100 mg via ORAL
  Filled 2014-05-07 (×10): qty 1

## 2014-05-07 MED ORDER — FENTANYL CITRATE 0.05 MG/ML IJ SOLN
25.0000 ug | INTRAMUSCULAR | Status: DC | PRN
  Administered 2014-05-07: 50 ug via INTRAVENOUS

## 2014-05-07 MED ORDER — HYDROCODONE-ACETAMINOPHEN 5-325 MG PO TABS
1.0000 | ORAL_TABLET | Freq: Four times a day (QID) | ORAL | Status: DC | PRN
  Administered 2014-05-08 – 2014-05-09 (×3): 1 via ORAL
  Administered 2014-05-10 – 2014-05-11 (×3): 2 via ORAL
  Filled 2014-05-07: qty 1
  Filled 2014-05-07 (×2): qty 2
  Filled 2014-05-07 (×2): qty 1
  Filled 2014-05-07: qty 2

## 2014-05-07 MED ORDER — HEPARIN (PORCINE) IN NACL 100-0.45 UNIT/ML-% IJ SOLN
1250.0000 [IU]/h | INTRAMUSCULAR | Status: DC
  Administered 2014-05-08: 950 [IU]/h via INTRAVENOUS
  Filled 2014-05-07 (×2): qty 250

## 2014-05-07 MED ORDER — PHENOL 1.4 % MT LIQD: 1.0000 | OROMUCOSAL | Status: DC | PRN

## 2014-05-07 MED ORDER — METOCLOPRAMIDE HCL 10 MG PO TABS: 5.0000 mg | ORAL_TABLET | Freq: Three times a day (TID) | ORAL | Status: DC | PRN

## 2014-05-07 MED ORDER — ACETAMINOPHEN 325 MG PO TABS: 650.0000 mg | ORAL_TABLET | Freq: Four times a day (QID) | ORAL | Status: DC | PRN

## 2014-05-07 MED ORDER — CEFAZOLIN SODIUM-DEXTROSE 2-3 GM-% IV SOLR
INTRAVENOUS | Status: DC | PRN
  Administered 2014-05-07: 2 g via INTRAVENOUS

## 2014-05-07 MED ORDER — LACTATED RINGERS IV SOLN
INTRAVENOUS | Status: DC | PRN
  Administered 2014-05-07 (×2): via INTRAVENOUS

## 2014-05-07 MED ORDER — CEFAZOLIN SODIUM-DEXTROSE 2-3 GM-% IV SOLR
INTRAVENOUS | Status: AC
  Filled 2014-05-07: qty 50

## 2014-05-07 MED ORDER — ACETAMINOPHEN 650 MG RE SUPP: 650.0000 mg | Freq: Four times a day (QID) | RECTAL | Status: DC | PRN

## 2014-05-07 MED ORDER — FENTANYL CITRATE 0.05 MG/ML IJ SOLN
INTRAMUSCULAR | Status: AC
  Filled 2014-05-07: qty 2

## 2014-05-07 MED ORDER — PROPOFOL 10 MG/ML IV BOLUS
INTRAVENOUS | Status: AC
  Filled 2014-05-07: qty 20

## 2014-05-07 MED ORDER — BUPIVACAINE HCL (PF) 0.25 % IJ SOLN
INTRAMUSCULAR | Status: AC
  Filled 2014-05-07: qty 30

## 2014-05-07 SURGICAL SUPPLY — 68 items
APL SKNCLS STERI-STRIP NONHPOA (GAUZE/BANDAGES/DRESSINGS) ×1
BENZOIN TINCTURE PRP APPL 2/3 (GAUZE/BANDAGES/DRESSINGS) ×3 IMPLANT
BLADE SAW SAG 73X25 THK (BLADE) ×2
BLADE SAW SGTL 73X25 THK (BLADE) ×1 IMPLANT
BLADE SURG ROTATE 9660 (MISCELLANEOUS) IMPLANT
BRUSH FEMORAL CANAL (MISCELLANEOUS) IMPLANT
CAPT HIP HEMI 1 ×2 IMPLANT
CLOSURE WOUND 1/2 X4 (GAUZE/BANDAGES/DRESSINGS) ×2
COVER BACK TABLE 24X17X13 BIG (DRAPES) IMPLANT
DRAPE IMP U-DRAPE 54X76 (DRAPES) ×3 IMPLANT
DRAPE INCISE IOBAN 66X45 STRL (DRAPES) IMPLANT
DRAPE ORTHO SPLIT 77X108 STRL (DRAPES) ×6
DRAPE SURG ORHT 6 SPLT 77X108 (DRAPES) ×2 IMPLANT
DRAPE U-SHAPE 47X51 STRL (DRAPES) ×3 IMPLANT
DRSG MEPILEX BORDER 4X8 (GAUZE/BANDAGES/DRESSINGS) ×3 IMPLANT
DRSG PAD ABDOMINAL 8X10 ST (GAUZE/BANDAGES/DRESSINGS) ×4 IMPLANT
DURAPREP 26ML APPLICATOR (WOUND CARE) ×3 IMPLANT
ELECT CAUTERY BLADE 6.4 (BLADE) ×3 IMPLANT
ELECT REM PT RETURN 9FT ADLT (ELECTROSURGICAL) ×3
ELECTRODE REM PT RTRN 9FT ADLT (ELECTROSURGICAL) ×1 IMPLANT
EVACUATOR 1/8 PVC DRAIN (DRAIN) IMPLANT
FACESHIELD WRAPAROUND (MASK) ×3 IMPLANT
FACESHIELD WRAPAROUND OR TEAM (MASK) ×2 IMPLANT
GAUZE SPONGE 4X4 12PLY STRL (GAUZE/BANDAGES/DRESSINGS) ×3 IMPLANT
GAUZE XEROFORM 5X9 LF (GAUZE/BANDAGES/DRESSINGS) ×3 IMPLANT
GLOVE BIOGEL PI IND STRL 7.5 (GLOVE) ×1 IMPLANT
GLOVE BIOGEL PI IND STRL 8 (GLOVE) ×1 IMPLANT
GLOVE BIOGEL PI INDICATOR 7.5 (GLOVE)
GLOVE BIOGEL PI INDICATOR 8 (GLOVE) ×2
GLOVE ECLIPSE 7.0 STRL STRAW (GLOVE) ×1 IMPLANT
GLOVE ORTHO TXT STRL SZ7.5 (GLOVE) ×3 IMPLANT
GOWN STRL REUS W/ TWL LRG LVL3 (GOWN DISPOSABLE) ×1 IMPLANT
GOWN STRL REUS W/ TWL XL LVL3 (GOWN DISPOSABLE) ×1 IMPLANT
GOWN STRL REUS W/TWL LRG LVL3 (GOWN DISPOSABLE) ×3
GOWN STRL REUS W/TWL XL LVL3 (GOWN DISPOSABLE) ×3
HANDPIECE INTERPULSE COAX TIP (DISPOSABLE)
IMMOBILIZER KNEE 20 (SOFTGOODS) ×2 IMPLANT
IMMOBILIZER KNEE 22 UNIV (SOFTGOODS) IMPLANT
KIT BASIN OR (CUSTOM PROCEDURE TRAY) ×3 IMPLANT
KIT ROOM TURNOVER OR (KITS) ×3 IMPLANT
MANIFOLD NEPTUNE II (INSTRUMENTS) ×1 IMPLANT
NDL HYPO 25GX1X1/2 BEV (NEEDLE) ×1 IMPLANT
NDL SUT 2 .5 CRC MAYO 1.732X (NEEDLE) ×1 IMPLANT
NEEDLE HYPO 25GX1X1/2 BEV (NEEDLE) ×3 IMPLANT
NEEDLE MAYO TAPER (NEEDLE) ×3
NS IRRIG 1000ML POUR BTL (IV SOLUTION) ×3 IMPLANT
PACK TOTAL JOINT (CUSTOM PROCEDURE TRAY) ×3 IMPLANT
PACK UNIVERSAL I (CUSTOM PROCEDURE TRAY) ×3 IMPLANT
PAD ARMBOARD 7.5X6 YLW CONV (MISCELLANEOUS) ×6 IMPLANT
PIN STEINMAN 3/16 (PIN) ×1 IMPLANT
SET HNDPC FAN SPRY TIP SCT (DISPOSABLE) IMPLANT
SPONGE GAUZE 4X4 12PLY STER LF (GAUZE/BANDAGES/DRESSINGS) ×2 IMPLANT
SPONGE LAP 4X18 X RAY DECT (DISPOSABLE) ×2 IMPLANT
STAPLER VISISTAT 35W (STAPLE) ×3 IMPLANT
STRIP CLOSURE SKIN 1/2X4 (GAUZE/BANDAGES/DRESSINGS) ×4 IMPLANT
SUCTION FRAZIER TIP 10 FR DISP (SUCTIONS) ×3 IMPLANT
SUT ETHIBOND NAB CT1 #1 30IN (SUTURE) ×9 IMPLANT
SUT TICRON (SUTURE) ×6 IMPLANT
SUT VIC AB 2-0 CT1 27 (SUTURE) ×6
SUT VIC AB 2-0 CT1 TAPERPNT 27 (SUTURE) ×3 IMPLANT
SUT VICRYL 0 TIES 12 18 (SUTURE) ×1 IMPLANT
SYR CONTROL 10ML LL (SYRINGE) ×3 IMPLANT
TAPE CLOTH SURG 4X10 WHT LF (GAUZE/BANDAGES/DRESSINGS) ×2 IMPLANT
TOWEL OR 17X24 6PK STRL BLUE (TOWEL DISPOSABLE) ×3 IMPLANT
TOWEL OR 17X26 10 PK STRL BLUE (TOWEL DISPOSABLE) ×3 IMPLANT
TOWER CARTRIDGE SMART MIX (DISPOSABLE) IMPLANT
TRAY FOLEY CATH 16FRSI W/METER (SET/KITS/TRAYS/PACK) IMPLANT
WATER STERILE IRR 1000ML POUR (IV SOLUTION) ×12 IMPLANT

## 2014-05-07 NOTE — Progress Notes (Signed)
Subjective: Pt complaining of hip pain, nurse just gave her dilaudid injection. Does not know what year or city she is in.   Objective: Vital signs in last 24 hours: Filed Vitals:   05/06/14 0707 05/06/14 1353 05/06/14 2020 05/07/14 0506  BP: 198/68 157/69 153/65 168/57  Pulse: 87 73 73 77  Temp: 98.3 F (36.8 C) 98.3 F (36.8 C) 98.6 F (37 C) 98.7 F (37.1 C)  TempSrc: Oral  Oral Oral  Resp: 18 16 18 18   Height:      Weight:      SpO2: 95% 97% 100% 94%   Weight change:   Intake/Output Summary (Last 24 hours) at 05/07/14 0835 Last data filed at 05/07/14 0404  Gross per 24 hour  Intake    420 ml  Output    950 ml  Net   -530 ml   General: NAD, diaphoretic HEENT: moist mucous membranes Cardiac: RRR, no murmurs Lungs: CTAB GI: active bowel sounds, soft   Lab Results: Basic Metabolic Panel:  Recent Labs Lab 05/05/14 2142 05/06/14 0535  NA 135 136  K 4.1 4.4  CL 102 104  CO2 24 27  GLUCOSE 122* 160*  BUN 10 11  CREATININE 0.72 0.76  CALCIUM 9.3 9.1   CBC:  Recent Labs Lab 05/05/14 2142 05/06/14 0535 05/07/14 0533  WBC 17.1* 14.8* 15.5*  NEUTROABS 14.6*  --  13.1*  HGB 15.4* 15.0 14.0  HCT 44.7 44.5 41.6  MCV 88.3 88.8 89.1  PLT 215 193 184   Coagulation:  Recent Labs Lab 05/05/14 2142 05/06/14 0535 05/07/14 0533  LABPROT 29.8* 32.4* 19.9*  INR 2.81* 3.12* 1.67*   Urinalysis:  Recent Labs Lab 05/06/14 0231  COLORURINE YELLOW  LABSPEC 1.020  PHURINE 6.0  GLUCOSEU NEGATIVE  HGBUR SMALL*  BILIRUBINUR NEGATIVE  KETONESUR 15*  PROTEINUR NEGATIVE  UROBILINOGEN 0.2  NITRITE POSITIVE*  LEUKOCYTESUR MODERATE*    Studies/Results: Dg Hip Complete Left  05/05/2014   CLINICAL DATA:  Status post fall. Acute onset of left hip pain and shortening. Initial encounter.  EXAM: LEFT HIP - COMPLETE 2+ VIEW  COMPARISON:  None.  FINDINGS: There is a mildly displaced subcapital fracture through the left femoral neck, with shortening at the  fracture site. The left femoral head remains seated at the acetabulum. No additional fractures are seen. The right hip joint is unremarkable in appearance.  Mild degenerative change is noted at the lower lumbar spine, with mild rotatory scoliosis. The sacroiliac joints are grossly unremarkable. The visualized bowel gas pattern is within normal limits.  IMPRESSION: Mildly displaced subcapital fracture through the left femoral neck, with shortening at the fracture site.   Electronically Signed   By: Roanna RaiderJeffery  Chang M.D.   On: 05/05/2014 22:54   Dg Femur Left  05/05/2014   CLINICAL DATA:  Status post fall. Acute onset of left hip pain and shortening. Initial encounter.  EXAM: LEFT FEMUR - 2 VIEW  COMPARISON:  CT of the abdomen and pelvis performed 04/08/2009  FINDINGS: There is a mildly displaced subcapital fracture through the left femoral neck. No additional fractures are seen. The distal femur appears intact. The knee joint is grossly unremarkable. No knee joint effusion is identified. No significant soft tissue abnormalities are seen.  IMPRESSION: Mildly displaced subcapital fracture through the left femoral neck.   Electronically Signed   By: Roanna RaiderJeffery  Chang M.D.   On: 05/05/2014 22:53   Ct Head Wo Contrast  05/06/2014   CLINICAL DATA:  Fall  EXAM: CT HEAD WITHOUT CONTRAST  TECHNIQUE: Contiguous axial images were obtained from the base of the skull through the vertex without intravenous contrast.  COMPARISON:  04/08/2009  FINDINGS: Mild cortical volume loss noted with proportional ventricular prominence. Areas of periventricular white matter hypodensity are most compatible with small vessel ischemic change. No acute hemorrhage, infarct, or mass lesion is identified. No midline shift. Moderate right maxillary sinusitis. No skull fracture.  IMPRESSION: No acute intracranial finding.  Chronic findings as above.  Moderate right maxillary sinusitis.   Electronically Signed   By: Christiana PellantGretchen  Green M.D.   On:  05/06/2014 01:43   Medications: I have reviewed the patient's current medications. Scheduled Meds: . atorvastatin  20 mg Oral Daily  . buPROPion  75 mg Oral Daily  . cefTRIAXone (ROCEPHIN)  IV  1 g Intravenous Daily  . cyanocobalamin  500 mcg Oral Daily  . escitalopram  20 mg Oral Daily  . lisinopril  10 mg Oral Daily  . metoprolol  75 mg Oral BID  . temazepam  30 mg Oral QHS   Continuous Infusions: . heparin     PRN Meds:.acetaminophen **OR** acetaminophen, HYDROmorphone (DILAUDID) injection, methocarbamol Assessment/Plan: Principal Problem:   Displaced fracture of left femoral neck Active Problems:   Essential hypertension, benign   Atrial fibrillation   MITRAL VALVE REPLACEMENT, HX OF   Dementia   Leukocytosis   Fracture of femoral neck, left   Left Femoral neck fracture-  -  Dr. Ophelia CharterYates will take patient to OR today, INR 1.62 - Will continue to treat her pain in the mean time with dialudid 0.5mg  q4hr PRN - pharm bridging w/ heparin gtt, repeat heparin level at 4:00pm today.  UTI-- asymptomatic. UA positive for TNTC WBC, nitrites, LE, and many bacteria. Likely cause of leukocytosis.  - urine culture reveal e.coli, sensitivities pending - cont rocephin IV  Paroxysmal Afib - on coumadin at home.  - held coumadin as above, bridge with IV heparin - cont metoprolol.  HTN - elevated overnight.  At home takes lisinopril 10mg , metoprolol 75mg  BID - will continue home meds and monitor   Depression - cont lexapro Anxiety - cont bupropion Insomnia - cont restoril.    Dispo: Disposition is deferred at this time, awaiting improvement of current medical problems. Anticipated discharge in approximately 2-3 day(s).   The patient does have a current PCP Lorie Phenix(Nancy Maloney, MD) and [facility] after discharge.  The patient does have transportation limitations that hinder transportation to clinic appointments.  .Services Needed at time of  discharge: Y = Yes, Blank = No PT:   OT:   RN:   Equipment:   Other:     LOS: 2 days   Gara Kroneriana Truong, MD 05/07/2014, 8:35 AM

## 2014-05-07 NOTE — Brief Op Note (Signed)
05/05/2014 - 05/07/2014  6:44 PM  PATIENT:  Jonni Sangerecilia R Hosie  73 y.o. female  PRE-OPERATIVE DIAGNOSIS:  left femoral neck fracture  POST-OPERATIVE DIAGNOSIS:  same  PROCEDURE:  Procedure(s): ARTHROPLASTY MONOPOLAR HIP (Left)  SURGEON:  Surgeon(s) and Role:    * Eldred MangesMark C Handsome Anglin, MD - Primary  PHYSICIAN ASSISTANT:   ASSISTANTS: none   ANESTHESIA:   local and general  EBL:  Total I/O In: 2200 [I.V.:2200] Out: 250 [Urine:50; Blood:200]  BLOOD ADMINISTERED:none  DRAINS: none   LOCAL MEDICATIONS USED:  MARCAINE     SPECIMEN:  No Specimen  DISPOSITION OF SPECIMEN:  N/A  COUNTS:  YES  TOURNIQUET:  * No tourniquets in log *  DICTATION: .Other Dictation: Dictation Number [id number]  PLAN OF CARE: already inpt  PATIENT DISPOSITION:  PACU - hemodynamically stable.   Delay start of Pharmacological VTE agent (>24hrs) due to surgical blood loss or risk of bleeding: yes

## 2014-05-07 NOTE — Transfer of Care (Signed)
Immediate Anesthesia Transfer of Care Note  Patient: [name] SangerCecilia R Fulcher  Procedure(s) Performed: Procedure(s): ARTHROPLASTY MONOPOLAR HIP (Left)  Patient Location: PACU  Anesthesia Type:General  Level of Consciousness: awake  Airway & Oxygen Therapy: Patient Spontanous Breathing and Patient connected to nasal cannula oxygen  Post-op Assessment: Report given to PACU RN and Post -op Vital signs reviewed and stable  Post vital signs: Reviewed and stable  Complications: No apparent anesthesia complications

## 2014-05-07 NOTE — Progress Notes (Signed)
ANTICOAGULATION CONSULT NOTE - Follow Up Consult  Pharmacy Consult for Heparin Indication: atrial fibrillation  Allergies  Allergen Reactions  . Codeine Other (See Comments)    Patient Measurements: Height: 5\' 7"  (170.2 cm) Weight: 137 lb 3.2 oz (62.234 kg) IBW/kg (Calculated) : 61.6  Vital Signs: Temp: 98.7 F (37.1 C) (12/26 0506) Temp Source: Oral (12/26 0506) BP: 168/57 mmHg (12/26 0506) Pulse Rate: 77 (12/26 0506)  Labs:  Recent Labs  05/05/14 2142 05/06/14 0535 05/07/14 0533  HGB 15.4* 15.0 14.0  HCT 44.7 44.5 41.6  PLT 215 193 184  LABPROT 29.8* 32.4* 19.9*  INR 2.81* 3.12* 1.67*  CREATININE 0.72 0.76  --     Estimated Creatinine Clearance: 60.9 mL/min (by C-G formula based on Cr of 0.76).  Assessment: 73 yo F presents on 12/24 with L hip pain after fall. Larey SeatFell this morning and landed on her left side. Xray showed mildly displaced subcapital fracture through the left femoral neck. Pt to transition off of coumadin and bridge with heparin to prepare for surgery. INR yesterday was 3.12, pt was given vitamin K and today the INR is 1.67. Will start heparin gtt today. CBC stable, Hgb 14.0 and plts 184. No s/s of bleed.  Goal of Therapy:  Heparin level 0.3-0.7 units/ml Monitor platelets by anticoagulation protocol: Yes   Plan:  Continue to HOLD coumadin Start heparin gtt at 950 units/hr with NO bolus Check 8 hr HL at 1600 today Monitor daily HL, CBC, s/s of bleed  Davi Kroon J 05/07/2014,7:30 AM

## 2014-05-07 NOTE — H&P (View-Only) (Signed)
Reason for Consult:fall with left closed displaced femoral neck fracture Referring Physician: Dareen Piano MD  [name] [name] is an 73 y.o. female.  HPI: 73 yo female with fall and left angulated displaced femoral neck fracture. Multiple medical problems. Lives with husband , community ambulator with assistive devices. No previous Hx of hip injury. Neg LOC  Past Medical History  Diagnosis Date  . CAD (coronary artery disease) 08/2005    non-obstructive by cath  . Atrial fibrillation     on chronic coumadin  . Depression   . Atrial flutter     s/p RFA x 2  . Mitral regurgitation     S/P Bjork Shiley mechanical mitral valve replacement in 1980  . Gastroesophageal reflux disease   . TIA (transient ischemic attack)   . HTN (hypertension)   . Anxiety   . HLD (hyperlipidemia)   . Carotid stenosis     a. Carotid US (9/14):  Bilat 1-39% => f/u PRN    Past Surgical History  Procedure Laterality Date  . A flutter ablation      x 2  . Mitral valve replacement      Bjork-Shiley valve placed for mitral regurgitation    Family History  Problem Relation Age of Onset  . Heart attack Other     Social History:  reports that she has been smoking Cigarettes.  She has been smoking about 2.00 packs per day. She uses smokeless tobacco. She reports that she does not drink alcohol or use illicit drugs.  Allergies:  Allergies  Allergen Reactions  . Codeine Other (See Comments)    Medications: I have reviewed the patient's current medications.  Results for orders placed or performed during the hospital encounter of 05/05/14 (from the past 48 hour(s))  Basic metabolic panel     Status: Abnormal   Collection Time: 05/05/14  9:42 PM  Result Value Ref Range   Sodium 135 135 - 145 mmol/L    Comment: Please note change in reference range.   Potassium 4.1 3.5 - 5.1 mmol/L    Comment: Please note change in reference range.   Chloride 102 96 - 112 mEq/L   CO2 24 19 - 32 mmol/L   Glucose, Bld 122  (H) 70 - 99 mg/dL   BUN 10 6 - 23 mg/dL   Creatinine, Ser 0.72 0.50 - 1.10 mg/dL   Calcium 9.3 8.4 - 10.5 mg/dL   GFR calc non Af Amer 83 (L) >90 mL/min   GFR calc Af Amer >90 >90 mL/min    Comment: (NOTE) The eGFR has been calculated using the CKD EPI equation. This calculation has not been validated in all clinical situations. eGFR's persistently <90 mL/min signify possible Chronic Kidney Disease.    Anion gap 9 5 - 15  CBC with Differential     Status: Abnormal   Collection Time: 05/05/14  9:42 PM  Result Value Ref Range   WBC 17.1 (H) 4.0 - 10.5 K/uL   RBC 5.06 3.87 - 5.11 MIL/uL   Hemoglobin 15.4 (H) 12.0 - 15.0 g/dL   HCT 44.7 36.0 - 46.0 %   MCV 88.3 78.0 - 100.0 fL   MCH 30.4 26.0 - 34.0 pg   MCHC 34.5 30.0 - 36.0 g/dL   RDW 12.9 11.5 - 15.5 %   Platelets 215 150 - 400 K/uL   Neutrophils Relative % 85 (H) 43 - 77 %   Neutro Abs 14.6 (H) 1.7 - 7.7 K/uL   Lymphocytes Relative 9 (L)  12 - 46 %   Lymphs Abs 1.5 0.7 - 4.0 K/uL   Monocytes Relative 5 3 - 12 %   Monocytes Absolute 0.8 0.1 - 1.0 K/uL   Eosinophils Relative 1 0 - 5 %   Eosinophils Absolute 0.1 0.0 - 0.7 K/uL   Basophils Relative 0 0 - 1 %   Basophils Absolute 0.0 0.0 - 0.1 K/uL  Protime-INR     Status: Abnormal   Collection Time: 05/05/14  9:42 PM  Result Value Ref Range   Prothrombin Time 29.8 (H) 11.6 - 15.2 seconds   INR 2.81 (H) 0.00 - 1.49  Urinalysis, Routine w reflex microscopic     Status: Abnormal   Collection Time: 05/06/14  2:31 AM  Result Value Ref Range   Color, Urine YELLOW YELLOW   APPearance CLOUDY (A) CLEAR   Specific Gravity, Urine 1.020 1.005 - 1.030   pH 6.0 5.0 - 8.0   Glucose, UA NEGATIVE NEGATIVE mg/dL   Hgb urine dipstick SMALL (A) NEGATIVE   Bilirubin Urine NEGATIVE NEGATIVE   Ketones, ur 15 (A) NEGATIVE mg/dL   Protein, ur NEGATIVE NEGATIVE mg/dL   Urobilinogen, UA 0.2 0.0 - 1.0 mg/dL   Nitrite POSITIVE (A) NEGATIVE   Leukocytes, UA MODERATE (A) NEGATIVE  Urine  microscopic-add on     Status: Abnormal   Collection Time: 05/06/14  2:31 AM  Result Value Ref Range   Squamous Epithelial / LPF RARE RARE   WBC, UA TOO NUMEROUS TO COUNT <3 WBC/hpf   RBC / HPF 3-6 <3 RBC/hpf   Bacteria, UA MANY (A) RARE  Protime-INR     Status: Abnormal   Collection Time: 05/06/14  5:35 AM  Result Value Ref Range   Prothrombin Time 32.4 (H) 11.6 - 15.2 seconds   INR 3.12 (H) 0.00 - 1.49  Basic metabolic panel     Status: Abnormal   Collection Time: 05/06/14  5:35 AM  Result Value Ref Range   Sodium 136 135 - 145 mmol/L    Comment: Please note change in reference range.   Potassium 4.4 3.5 - 5.1 mmol/L    Comment: Please note change in reference range.   Chloride 104 96 - 112 mEq/L   CO2 27 19 - 32 mmol/L   Glucose, Bld 160 (H) 70 - 99 mg/dL   BUN 11 6 - 23 mg/dL   Creatinine, Ser 1.61 0.50 - 1.10 mg/dL   Calcium 9.1 8.4 - 09.6 mg/dL   GFR calc non Af Amer 82 (L) >90 mL/min   GFR calc Af Amer >90 >90 mL/min    Comment: (NOTE) The eGFR has been calculated using the CKD EPI equation. This calculation has not been validated in all clinical situations. eGFR's persistently <90 mL/min signify possible Chronic Kidney Disease.    Anion gap 5 5 - 15  CBC     Status: Abnormal   Collection Time: 05/06/14  5:35 AM  Result Value Ref Range   WBC 14.8 (H) 4.0 - 10.5 K/uL   RBC 5.01 3.87 - 5.11 MIL/uL   Hemoglobin 15.0 12.0 - 15.0 g/dL   HCT 04.5 40.9 - 81.1 %   MCV 88.8 78.0 - 100.0 fL   MCH 29.9 26.0 - 34.0 pg   MCHC 33.7 30.0 - 36.0 g/dL   RDW 91.4 78.2 - 95.6 %   Platelets 193 150 - 400 K/uL    Dg Hip Complete Left  05/05/2014   CLINICAL DATA:  Status post fall. Acute onset of  left hip pain and shortening. Initial encounter.  EXAM: LEFT HIP - COMPLETE 2+ VIEW  COMPARISON:  None.  FINDINGS: There is a mildly displaced subcapital fracture through the left femoral neck, with shortening at the fracture site. The left femoral head remains seated at the acetabulum. No  additional fractures are seen. The right hip joint is unremarkable in appearance.  Mild degenerative change is noted at the lower lumbar spine, with mild rotatory scoliosis. The sacroiliac joints are grossly unremarkable. The visualized bowel gas pattern is within normal limits.  IMPRESSION: Mildly displaced subcapital fracture through the left femoral neck, with shortening at the fracture site.   Electronically Signed   By: Garald Balding M.D.   On: 05/05/2014 22:54   Dg Femur Left  05/05/2014   CLINICAL DATA:  Status post fall. Acute onset of left hip pain and shortening. Initial encounter.  EXAM: LEFT FEMUR - 2 VIEW  COMPARISON:  CT of the abdomen and pelvis performed 04/08/2009  FINDINGS: There is a mildly displaced subcapital fracture through the left femoral neck. No additional fractures are seen. The distal femur appears intact. The knee joint is grossly unremarkable. No knee joint effusion is identified. No significant soft tissue abnormalities are seen.  IMPRESSION: Mildly displaced subcapital fracture through the left femoral neck.   Electronically Signed   By: Garald Balding M.D.   On: 05/05/2014 22:53   Ct Head Wo Contrast  05/06/2014   CLINICAL DATA:  Fall  EXAM: CT HEAD WITHOUT CONTRAST  TECHNIQUE: Contiguous axial images were obtained from the base of the skull through the vertex without intravenous contrast.  COMPARISON:  04/08/2009  FINDINGS: Mild cortical volume loss noted with proportional ventricular prominence. Areas of periventricular white matter hypodensity are most compatible with small vessel ischemic change. No acute hemorrhage, infarct, or mass lesion is identified. No midline shift. Moderate right maxillary sinusitis. No skull fracture.  IMPRESSION: No acute intracranial finding.  Chronic findings as above.  Moderate right maxillary sinusitis.   Electronically Signed   By: Conchita Paris M.D.   On: 05/06/2014 01:43    Review of Systems  Constitutional: Negative for fever,  chills and weight loss.  Respiratory: Negative for cough.   Cardiovascular: Negative for chest pain.       MVR, on chronic coumadin  Genitourinary: Positive for urgency and frequency.  Musculoskeletal: Negative for myalgias.  Skin: Positive for rash.  Neurological: Negative for dizziness, sensory change and headaches.  Endo/Heme/Allergies: Bruises/bleeds easily.  Psychiatric/Behavioral: Negative for suicidal ideas.   Blood pressure 198/68, pulse 87, temperature 98.3 F (36.8 C), temperature source Oral, resp. rate 18, height $RemoveBe'5\' 7"'[id number]$  (1.702 m), weight 62.234 kg (137 lb 3.2 oz), SpO2 95 %. Physical Exam  Constitutional: She appears well-developed and well-nourished.  Cardiovascular:  Mechanical valve murmur. IR IR pulse  Respiratory: Effort normal. No respiratory distress. She has no wheezes.  GI: Soft.  Musculoskeletal:  Left hip short and ER pulses intact  Psychiatric:  Slow with some responses ? Due to pain meds for hip pain. Judgement , good. O times 3    Assessment/Plan: Left femoral neck fracture, for correction of protime to less than 2 and hopeful surgery Saturday afternoon. ( about 5 PM)  Belisa Eichholz C 05/06/2014, 8:59 AM

## 2014-05-07 NOTE — Anesthesia Postprocedure Evaluation (Signed)
  Anesthesia Post-op Note  Patient: [name] [name]  Procedure(s) Performed: Procedure(s): ARTHROPLASTY MONOPOLAR HIP (Left)  Patient Location: PACU  Anesthesia Type:General  Level of Consciousness: awake, mildly confused  Airway and Oxygen Therapy: Nasal O2 Post-op Pain: mild  Post-op Assessment: Post-op Vital signs reviewed, Patient's Cardiovascular Status Stable, Respiratory Function Stable and Patent Airway  Post-op Vital Signs: stable  Last Vitals:  Filed Vitals:   05/07/14 1847  BP: 140/81  Pulse: 71  Temp: 37.1 C  Resp: 15    Complications: No apparent anesthesia complications

## 2014-05-07 NOTE — Anesthesia Preprocedure Evaluation (Addendum)
Anesthesia Evaluation  Patient identified by MRN, date of birth, ID band Patient awake and Patient confused    Reviewed: Allergy & Precautions, H&P , NPO status , Patient's Chart, lab work & pertinent test results, reviewed documented beta blocker date and time   Airway Mallampati: II  TM Distance: >3 FB Neck ROM: Full    Dental  (+) Poor Dentition, Dental Advisory Given Mult broken teeth:   Pulmonary Current Smoker,  breath sounds clear to auscultation        Cardiovascular hypertension, Pt. on medications and Pt. on home beta blockers Rhythm:Irregular Rate:Normal  S/p MVR   Neuro/Psych TIA   GI/Hepatic   Endo/Other    Renal/GU      Musculoskeletal   Abdominal   Peds  Hematology   Anesthesia Other Findings   Reproductive/Obstetrics                          Anesthesia Physical Anesthesia Plan  ASA: III  Anesthesia Plan: General   Post-op Pain Management:    Induction: Intravenous  Airway Management Planned: Oral ETT  Additional Equipment:   Intra-op Plan:   Post-operative Plan: Extubation in OR  Informed Consent:   Dental advisory given  Plan Discussed with: Anesthesiologist and CRNA  Anesthesia Plan Comments:        Anesthesia Quick Evaluation

## 2014-05-07 NOTE — Progress Notes (Signed)
Orthopedic Tech Progress Note Patient Details:  Jonni SangerCecilia R Canche [birth year] [id number]  Ortho Devices Ortho Device/Splint Location: applied ohf to  bed Ortho Device/Splint Interventions: Ordered, Application   Jennye MoccasinHughes, Darrelle Wiberg Craig 05/07/2014, 9:04 PM

## 2014-05-07 NOTE — Progress Notes (Signed)
Subjective:   Procedure(s) (LRB): ARTHROPLASTY MONOPOLAR HIP (Left) Patient reports pain as moderate.    Objective: Vital signs in last 24 hours: Temp:  [98.3 F (36.8 C)-98.7 F (37.1 C)] 98.7 F (37.1 C) (12/26 0506) Pulse Rate:  [73-77] 77 (12/26 0506) Resp:  [16-18] 18 (12/26 0506) BP: (153-168)/(57-69) 168/57 mmHg (12/26 0506) SpO2:  [94 %-100 %] 94 % (12/26 0506)  Intake/Output from previous day: 12/25 0701 - 12/26 0700 In: 540 [P.O.:540] Out: 1350 [Urine:1350] Intake/Output this shift:     Recent Labs  05/05/14 2142 05/06/14 0535 05/07/14 0533  HGB 15.4* 15.0 14.0    Recent Labs  05/06/14 0535 05/07/14 0533  WBC 14.8* 15.5*  RBC 5.01 4.67  HCT 44.5 41.6  PLT 193 184    Recent Labs  05/05/14 2142 05/06/14 0535  NA 135 136  K 4.1 4.4  CL 102 104  CO2 24 27  BUN 10 11  CREATININE 0.72 0.76  GLUCOSE 122* 160*  CALCIUM 9.3 9.1    Recent Labs  05/06/14 0535 05/07/14 0533  INR 3.12* 1.67*    Neurologically intact  Assessment/Plan:   Procedure(s) (LRB): ARTHROPLASTY MONOPOLAR HIP (Left) Plan  Surgery today INR 1.62 which is OK for surgery.   Brayden Betters C 05/07/2014, 7:57 AM

## 2014-05-07 NOTE — Interval H&P Note (Signed)
History and Physical Interval Note:  05/07/2014 5:01 PM  [name] SangerCecilia R Muramoto  has presented today for surgery, with the diagnosis of left femoral neck fracture  The various methods of treatment have been discussed with the patient and family. After consideration of risks, benefits and other options for treatment, the patient has consented to  Procedure(s): ARTHROPLASTY MONOPOLAR HIP (Left) as a surgical intervention .  The patient's history has been reviewed, patient examined, no change in status, stable for surgery.  I have reviewed the patient's chart and labs.  Questions were answered to the patient's satisfaction.     YATES,MARK C

## 2014-05-07 NOTE — Progress Notes (Signed)
ANTICOAGULATION CONSULT NOTE - Follow Up Consult  Pharmacy Consult for Heparin Indication: atrial fibrillation  Allergies  Allergen Reactions  . Codeine Other (See Comments)    Patient Measurements: Height: 5\' 7"  (170.2 cm) Weight: 137 lb 3.2 oz (62.234 kg) IBW/kg (Calculated) : 61.6  Vital Signs: Temp: 98.8 F (37.1 C) (12/26 1847) Temp Source: Oral (12/26 1630) BP: 140/81 mmHg (12/26 1847) Pulse Rate: 71 (12/26 1847)  Labs:  Recent Labs  05/05/14 2142 05/06/14 0535 05/07/14 0533  HGB 15.4* 15.0 14.0  HCT 44.7 44.5 41.6  PLT 215 193 184  LABPROT 29.8* 32.4* 19.9*  INR 2.81* 3.12* 1.67*  CREATININE 0.72 0.76  --     Estimated Creatinine Clearance: 60.9 mL/min (by C-G formula based on Cr of 0.76).  Assessment: 73 yo F presents on 12/24 with L hip pain after fall. Larey SeatFell this morning and landed on her left side. Xray showed mildly displaced subcapital fracture through the left femoral neck. Pt was transitioned off of coumadin and bridged with heparin to prepare for surgery. Now status post surgery with INR 1.67. Per orthopedic surgeon, resume heparin and coumadin tomorrow morning due to mild blood loss.   Goal of Therapy:  Heparin level 0.3-0.7 units/ml Monitor platelets by anticoagulation protocol: Yes   Plan:  -Resume heparin infusion at previous infusion rate of 950 units/hr with NO bolus starting tomorrow morning at 0700.  -Coumadin dose to be entered tomorrow -F/u an 8 hour heparin level tomorrow   Vinnie LevelBenjamin Taelon Bendorf, PharmD., BCPS Clinical Pharmacist Pager [phone]

## 2014-05-07 NOTE — Progress Notes (Addendum)
Changed heparin order to start at 1900 tomorrow instead of 0700 (24 hours post-op per OP note).   Please continue heparin per pharmacy otherwise, except for the start time change.    ----   Addendum: Pharmacy has discussed timing with Dr. Ophelia CharterYates directly. I was assuming the start time based on his note but he has instructed pharmacy verbally to start heparin tomorrow at 0700. Will follow this timing.

## 2014-05-07 NOTE — Anesthesia Procedure Notes (Signed)
Procedure Name: Intubation Date/Time: 05/07/2014 5:33 PM Performed by: Alanda AmassFRIEDMAN, Venecia Mehl A Pre-anesthesia Checklist: Patient identified, Timeout performed, Emergency Drugs available, Suction available and Patient being monitored Patient Re-evaluated:Patient Re-evaluated prior to inductionOxygen Delivery Method: Circle system utilized Preoxygenation: Pre-oxygenation with 100% oxygen Intubation Type: IV induction Ventilation: Mask ventilation without difficulty Laryngoscope Size: Mac and 3 Grade View: Grade II Tube type: Oral Tube size: 7.5 mm Number of attempts: 1 Airway Equipment and Method: Stylet Placement Confirmation: ETT inserted through vocal cords under direct vision,  breath sounds checked- equal and bilateral and positive ETCO2 Secured at: 21 cm Tube secured with: Tape Dental Injury: Teeth and Oropharynx as per pre-operative assessment

## 2014-05-08 DIAGNOSIS — Z96642 Presence of left artificial hip joint: Secondary | ICD-10-CM

## 2014-05-08 DIAGNOSIS — D62 Acute posthemorrhagic anemia: Secondary | ICD-10-CM

## 2014-05-08 LAB — PROTIME-INR
INR: 1.46 (ref 0.00–1.49)
Prothrombin Time: 17.9 seconds — ABNORMAL HIGH (ref 11.6–15.2)

## 2014-05-08 LAB — BASIC METABOLIC PANEL
ANION GAP: 4 — AB (ref 5–15)
BUN: 9 mg/dL (ref 6–23)
CALCIUM: 8.3 mg/dL — AB (ref 8.4–10.5)
CO2: 27 mmol/L (ref 19–32)
CREATININE: 0.59 mg/dL (ref 0.50–1.10)
Chloride: 101 mEq/L (ref 96–112)
GFR calc non Af Amer: 89 mL/min — ABNORMAL LOW (ref >90)
Glucose, Bld: 136 mg/dL — ABNORMAL HIGH (ref 70–99)
Potassium: 4.1 mmol/L (ref 3.5–5.1)
SODIUM: 132 mmol/L — AB (ref 135–145)

## 2014-05-08 LAB — CBC
HCT: 33 % — ABNORMAL LOW (ref 36.0–46.0)
Hemoglobin: 11.1 g/dL — ABNORMAL LOW (ref 12.0–15.0)
MCH: 30.5 pg (ref 26.0–34.0)
MCHC: 33.6 g/dL (ref 30.0–36.0)
MCV: 90.7 fL (ref 78.0–100.0)
Platelets: 159 10*3/uL (ref 150–400)
RBC: 3.64 MIL/uL — ABNORMAL LOW (ref 3.87–5.11)
RDW: 13.2 % (ref 11.5–15.5)
WBC: 10.2 10*3/uL (ref 4.0–10.5)

## 2014-05-08 LAB — URINE CULTURE

## 2014-05-08 LAB — HEPARIN LEVEL (UNFRACTIONATED): HEPARIN UNFRACTIONATED: 0.15 [IU]/mL — AB (ref 0.30–0.70)

## 2014-05-08 MED ORDER — WARFARIN SODIUM 5 MG PO TABS
5.0000 mg | ORAL_TABLET | Freq: Once | ORAL | Status: AC
  Filled 2014-05-08: qty 1

## 2014-05-08 MED ORDER — HEPARIN BOLUS VIA INFUSION
1500.0000 [IU] | Freq: Once | INTRAVENOUS | Status: AC
  Administered 2014-05-08: 1500 [IU] via INTRAVENOUS
  Filled 2014-05-08: qty 1500

## 2014-05-08 NOTE — Evaluation (Signed)
Physical Therapy Evaluation Patient Details Name: [name] [name] MRN: [id number] DOB: 1940-06-17 Today's Date: 05/08/2014   History of Present Illness  [name] SangerCecilia R. Salyers is a 73 y.o. female admitted 05/05/14 after a fall at home resulting in left displaced femoral neck fx. Pt s/p L arthroplasty monopolar hip on 05/07/14. PMH:   Past Medical History  Diagnosis Date  . CAD (coronary artery disease) 08/2005    non-obstructive by cath  . Atrial fibrillation     on chronic coumadin  . Depression   . Atrial flutter     s/p RFA x 2  . Mitral regurgitation     S/P Bjork Shiley mechanical mitral valve replacement in 1980  . Gastroesophageal reflux disease   . TIA (transient ischemic attack)   . HTN (hypertension)   . Anxiety   . HLD (hyperlipidemia)   . Carotid stenosis     a. Carotid US (9/14):  Bilat 1-39% => f/u PRN   Past Surgical History  Procedure Laterality Date  . A flutter ablation      x 2  . Mitral valve replacement      Bjork-Shiley valve placed for mitral regurgitation     Clinical Impression  Patient is s/p above surgery resulting in functional limitations due to the deficits listed below (see PT Problem List).  Patient will benefit from skilled PT to increase their independence and safety with mobility to allow discharge to the venue listed below.       Follow Up Recommendations Home health PT;Supervision/Assistance - 24 hour (and I encourage pt's husband to get help from family prn)    Equipment Recommendations  Rolling walker with 5" wheels;3in1 (PT)    Recommendations for Other Services OT consult     Precautions / Restrictions Precautions Precautions: Posterior Hip;Fall Precaution Booklet Issued: Yes (comment) Precaution Comments: Educated pt and husband on precautions and incorporating into ADLs.  Required Braces or Orthoses: Knee Immobilizer - Left Knee Immobilizer - Left:  (advising that pt wear at all times to increase adherence to Posterior  Precautions ) Restrictions Weight Bearing Restrictions: Yes LLE Weight Bearing: Weight bearing as tolerated      Mobility  Bed Mobility Overal bed mobility: Needs Assistance Bed Mobility: Supine to Sit     Supine to sit: Min assist;HOB elevated     General bed mobility comments: VC's for sequencing, however pt able to complete with min (A). Use of bed rails.   Transfers Overall transfer level: Needs assistance Equipment used: Rolling walker (2 wheeled) Transfers: Sit to/from Stand Sit to Stand: Min assist         General transfer comment: Min (A) for safety. VC's for sequencing. Pt stood impulsively due to need to urinate (has foley cath).   Ambulation/Gait Ambulation/Gait assistance: Min assist;Min guard Ambulation Distance (Feet): 45 Feet Assistive device: Rolling walker (2 wheeled) Gait Pattern/deviations: Step-through pattern Gait velocity: slowed   General Gait Details: Cues for gait sequence and technqiue; education provided to pt and husband re: how to maintain hip prec while turning  Stairs            Wheelchair Mobility    Modified Rankin (Stroke Patients Only)       Balance                                             Pertinent Vitals/Pain Pain Assessment: Faces  Faces Pain Scale: Hurts little more Pain Location: L hip Pain Descriptors / Indicators: Aching Pain Intervention(s): Monitored during session;Repositioned;Patient requesting pain meds-RN notified    Home Living Family/patient expects to be discharged to:: Private residence Living Arrangements: Spouse/significant other Available Help at Discharge: Family;Available 24 hours/day Type of Home: House Home Access: Stairs to enter Entrance Stairs-Rails: Can reach both Entrance Stairs-Number of Steps: 4 Home Layout: One level Home Equipment: None      Prior Function Level of Independence: Independent               Hand Dominance        Extremity/Trunk  Assessment   Upper Extremity Assessment: Generalized weakness           Lower Extremity Assessment: LLE deficits/detail   LLE Deficits / Details: Decr AROM and strength, limited by pain postop  Cervical / Trunk Assessment: Normal  Communication   Communication: No difficulties  Cognition Arousal/Alertness: Awake/alert Behavior During Therapy: Flat affect Overall Cognitive Status: History of cognitive impairments - at baseline (hx dementia)                      General Comments      Exercises        Assessment/Plan    PT Assessment Patient needs continued PT services  PT Diagnosis Difficulty walking;Acute pain   PT Problem List Decreased strength;Decreased range of motion;Decreased activity tolerance;Decreased balance;Decreased mobility;Decreased cognition;Decreased knowledge of use of DME;Decreased safety awareness;Pain;Decreased knowledge of precautions  PT Treatment Interventions DME instruction;Gait training;Stair training;Functional mobility training;Therapeutic activities;Therapeutic exercise;Balance training;Cognitive remediation;Patient/family education   PT Goals (Current goals can be found in the Care Plan section) Acute Rehab PT Goals Patient Stated Goal: not stated PT Goal Formulation: With patient/family Time For Goal Achievement: 05/22/14 Potential to Achieve Goals: Good    Frequency Min 6X/week   Barriers to discharge        Co-evaluation PT/OT/SLP Co-Evaluation/Treatment: Yes Reason for Co-Treatment: Necessary to address cognition/behavior during functional activity;Complexity of the patient's impairments (multi-system involvement) PT goals addressed during session: Mobility/safety with mobility;Proper use of DME OT goals addressed during session: ADL's and self-care       End of Session Equipment Utilized During Treatment: Gait belt Activity Tolerance: Patient tolerated treatment well Patient left: in chair;with call bell/phone within  reach;with chair alarm set;with family/visitor present Nurse Communication: Mobility status         Time: 1115-1145 PT Time Calculation (min) (ACUTE ONLY): 30 min   Charges:   PT Evaluation $Initial PT Evaluation Tier I: 1 Procedure PT Treatments $Gait Training: 8-22 mins   PT G CodesOlen Pel:        Gudelia Eugene Hamff 05/08/2014, 1:34 PM  Van ClinesHolly Venba Zenner, [place]PT  Acute Rehabilitation Services Pager [phone] Office [phone]

## 2014-05-08 NOTE — Progress Notes (Signed)
Occupational Therapy Evaluation Patient Details Name: [name] [name] MRN: [id number] DOB: 1941/04/09 Today's Date: 05/08/2014    History of Present Illness [name] SangerCecilia R. Quiroa is a 73 y.o. female admitted 05/05/14 after a fall at home resulting in left displaced femoral neck fx. Pt s/p L arthroplasty monopolar hip on 05/07/14 and per husband has hx of dementia.  PMH:   Past Medical History  Diagnosis Date  . CAD (coronary artery disease) 08/2005    non-obstructive by cath  . Atrial fibrillation     on chronic coumadin  . Depression   . Atrial flutter     s/p RFA x 2  . Mitral regurgitation     S/P Bjork Shiley mechanical mitral valve replacement in 1980  . Gastroesophageal reflux disease   . TIA (transient ischemic attack)   . HTN (hypertension)   . Anxiety   . HLD (hyperlipidemia)   . Carotid stenosis     a. Carotid US (9/14):  Bilat 1-39% => f/u PRN      Clinical Impression   PTA pt lived at home and husband reports she was independent with ADLs. Pt currently limited by decreased ROM and pain and difficulty recalling precautions due to hx of dementia. Pt will require 24/7 Supervision and assist for LB ADLs. Husband was educated on assisting pt with ADLs and functional mobility and would continue to benefit from acute OT for ADL training.     Follow Up Recommendations  Home health OT;Supervision/Assistance - 24 hour    Equipment Recommendations  3 in 1 bedside comode    Recommendations for Other Services       Precautions / Restrictions Precautions Precautions: Posterior Hip;Fall Precaution Booklet Issued: Yes (comment) Precaution Comments: Educated pt and husband on precautions and incorporating into ADLs.  Required Braces or Orthoses: Knee Immobilizer - Left Knee Immobilizer - Left:  (advising that pt wear at all times to increase adherence to ) Restrictions Weight Bearing Restrictions: Yes LLE Weight Bearing: Weight bearing as tolerated      Mobility Bed  Mobility Overal bed mobility: Needs Assistance Bed Mobility: Supine to Sit     Supine to sit: Min assist;HOB elevated     General bed mobility comments: VC's for sequencing, however pt able to complete with min (A). Use of bed rails.   Transfers Overall transfer level: Needs assistance Equipment used: Rolling walker (2 wheeled) Transfers: Sit to/from Stand Sit to Stand: Min assist         General transfer comment: Min (A) for safety. VC's for sequencing. Pt stood impulsively due to need to urinate (has foley cath).          ADL Overall ADL's : Needs assistance/impaired Eating/Feeding: Independent;Sitting   Grooming: Minimal assistance;Standing;Oral care;Wash/dry face;Wash/dry hands Grooming Details (indicate cue type and reason): min (A) to maintain balance in standing for grooming tasks Upper Body Bathing: Set up;Sitting Upper Body Bathing Details (indicate cue type and reason): VC's for sequencing Lower Body Bathing: Maximal assistance;Sit to/from stand   Upper Body Dressing : Minimal assistance;Sitting   Lower Body Dressing: Total assistance;Sit to/from stand Lower Body Dressing Details (indicate cue type and reason): due to posterior hip precautions Toilet Transfer: Moderate assistance;+2 for safety/equipment;Ambulation;RW Toilet Transfer Details (indicate cue type and reason): sit<>stand from recliner         Functional mobility during ADLs: Moderate assistance;Rolling walker;+2 for safety/equipment General ADL Comments: Pt with hx of dementia and unable to recall or comprehend precautions and purpose. Husband present and education on  incorporating precautions into ADLs. Pt will require constant cueing for precautions. Advise that KI remain on at all times for safety.      Vision                 Additional Comments: No complaints of changes.    Perception Perception Perception Tested?: No   Praxis Praxis Praxis tested?: Within functional limits     Pertinent Vitals/Pain Pain Assessment: Faces Faces Pain Scale: Hurts little more Pain Location: Lt hip Pain Descriptors / Indicators:  ("It hurts") Pain Intervention(s): Limited activity within patient's tolerance;Monitored during session;Repositioned;Patient requesting pain meds-RN notified     Hand Dominance     Extremity/Trunk Assessment Upper Extremity Assessment Upper Extremity Assessment: Generalized weakness   Lower Extremity Assessment Lower Extremity Assessment: Defer to PT evaluation   Cervical / Trunk Assessment Cervical / Trunk Assessment: Normal   Communication Communication Communication: No difficulties   Cognition Arousal/Alertness: Awake/alert Behavior During Therapy: Flat affect Overall Cognitive Status: History of cognitive impairments - at baseline (hx dementia)                                Home Living Family/patient expects to be discharged to:: Private residence Living Arrangements: Spouse/significant other Available Help at Discharge: Family;Available 24 hours/day Type of Home: House Home Access: Stairs to enter Entergy CorporationEntrance Stairs-Number of Steps: 4 Entrance Stairs-Rails: Can reach both Home Layout: One level     Bathroom Shower/Tub: Tub/shower unit Shower/tub characteristics: Door FirefighterBathroom Toilet: Standard Bathroom Accessibility: Yes How Accessible: Accessible via walker Home Equipment: None          Prior Functioning/Environment Level of Independence: Independent             OT Diagnosis: Generalized weakness;Acute pain   OT Problem List: Decreased strength;Decreased range of motion;Decreased activity tolerance;Impaired balance (sitting and/or standing);Decreased safety awareness;Decreased knowledge of use of DME or AE;Decreased knowledge of precautions;Pain   OT Treatment/Interventions: Self-care/ADL training;Therapeutic exercise;Energy conservation;DME and/or AE instruction;Therapeutic activities;Patient/family  education;Balance training    OT Goals(Current goals can be found in the care plan section) Acute Rehab OT Goals Patient Stated Goal: not stated OT Goal Formulation: With patient/family Time For Goal Achievement: 05/22/14 Potential to Achieve Goals: Good ADL Goals Pt Will Perform Grooming: with supervision;standing Pt Will Perform Lower Body Bathing: with caregiver independent in assisting;sit to/from stand (with Supervision to stand) Pt Will Perform Lower Body Dressing: with caregiver independent in assisting;sit to/from stand (with Supervision to stand) Pt Will Transfer to Toilet: with supervision;ambulating;bedside commode Pt Will Perform Toileting - Clothing Manipulation and hygiene: with supervision;sit to/from stand  OT Frequency: Min 2X/week           Co-evaluation PT/OT/SLP Co-Evaluation/Treatment: Yes Reason for Co-Treatment: Complexity of the patient's impairments (multi-system involvement);For patient/therapist safety;Necessary to address cognition/behavior during functional activity   OT goals addressed during session: ADL's and self-care      End of Session Equipment Utilized During Treatment: Gait belt;Rolling walker;Left knee immobilizer Nurse Communication: Patient requests pain meds  Activity Tolerance: Patient tolerated treatment well Patient left: in chair;with call bell/phone within reach;with chair alarm set;with family/visitor present   Time: 8413-24401115-1143 OT Time Calculation (min): 28 min Charges:  OT General Charges $OT Visit: 1 Procedure OT Evaluation $Initial OT Evaluation Tier I: 1 Procedure OT Treatments $Self Care/Home Management : 8-22 mins G-Codes:    Rae LipsMiller, Teresa Nicodemus M 05/08/2014, 12:17 PM   Yehuda MaoLeeAnn Guido SanderMarie Dru Laurel, OTR/L Occupational Therapist [phone] (pager)

## 2014-05-08 NOTE — Progress Notes (Signed)
ANTICOAGULATION CONSULT NOTE - Follow Up Consult  Pharmacy Consult for Heparin to Coumadin Indication: atrial fibrillation  Allergies  Allergen Reactions  . Codeine Other (See Comments)    Patient Measurements: Height: 5\' 7"  (170.2 cm) Weight: 137 lb 3.2 oz (62.234 kg) IBW/kg (Calculated) : 61.6  Vital Signs: Temp: 98.2 F (36.8 C) (12/27 0558) Temp Source: Oral (12/27 0558) BP: 151/61 mmHg (12/27 0558) Pulse Rate: 72 (12/27 0558)  Labs:  Recent Labs  05/05/14 2142 05/06/14 0535 05/07/14 0533 05/08/14 0456  HGB 15.4* 15.0 14.0 11.1*  HCT 44.7 44.5 41.6 33.0*  PLT 215 193 184 159  LABPROT 29.8* 32.4* 19.9* 17.9*  INR 2.81* 3.12* 1.67* 1.46  CREATININE 0.72 0.76  --  0.59    Estimated Creatinine Clearance: 60.9 mL/min (by C-G formula based on Cr of 0.59).  Assessment: 73 yo F presents on 12/24 with L hip pain after fall. Larey SeatFell this morning and landed on her left side. Xray showed mildly displaced subcapital fracture through the left femoral neck. Pt was transitioned off of coumadin and bridged with heparin to prepare for surgery. Now status post surgery with INR 1.46. Per orthopedic surgeon, resume heparin and coumadin today. Hgb trending down post-op to 11.1 from 14.0, plts 159. No apparent s/s of bleed.  Goal of Therapy:  INR 2-3 Heparin level 0.3-0.7 units/ml Monitor platelets by anticoagulation protocol: Yes   Plan:  Continue heparin gtt at 950 units/hr Restart coumadin 5mg  PO x 1 Check HL at 1500 today Monitor daily HL, INR, CBC, s/s of bleed  Dorance Spink J 05/08/2014,10:08 AM

## 2014-05-08 NOTE — Progress Notes (Signed)
Subjective: 1 Day Post-Op Procedure(s) (LRB): ARTHROPLASTY MONOPOLAR HIP (Left) Patient reports pain as mild.    Objective: Vital signs in last 24 hours: Temp:  [98 F (36.7 C)-98.9 F (37.2 C)] 98.2 F (36.8 C) (12/27 0558) Pulse Rate:  [70-77] 72 (12/27 0558) Resp:  [15-19] 18 (12/27 0558) BP: (116-161)/(48-81) 151/61 mmHg (12/27 0558) SpO2:  [90 %-99 %] 99 % (12/27 0558) FiO2 (%):  [28 %] 28 % (12/26 2031)  Intake/Output from previous day: 12/26 0701 - 12/27 0700 In: 2200 [I.V.:2200] Out: 800 [Urine:600; Blood:200] Intake/Output this shift:     Recent Labs  05/05/14 2142 05/06/14 0535 05/07/14 0533 05/08/14 0456  HGB 15.4* 15.0 14.0 11.1*    Recent Labs  05/07/14 0533 05/08/14 0456  WBC 15.5* 10.2  RBC 4.67 3.64*  HCT 41.6 33.0*  PLT 184 159    Recent Labs  05/06/14 0535 05/08/14 0456  NA 136 132*  K 4.4 4.1  CL 104 101  CO2 27 27  BUN 11 9  CREATININE 0.76 0.59  GLUCOSE 160* 136*  CALCIUM 9.1 8.3*    Recent Labs  05/07/14 0533 05/08/14 0456  INR 1.67* 1.46    Neurologically intact  Leg lenth equal. Xray hip reduced.   Assessment/Plan: 1 Day Post-Op Procedure(s) (LRB): ARTHROPLASTY MONOPOLAR HIP (Left) Up with therapy  Bralyn Espino C 05/08/2014, 9:15 AM

## 2014-05-08 NOTE — Op Note (Signed)
NAMBeverely Low:  Covelli, Donne             ACCOUNT NO.:  [id number]  MEDICAL RECORD NO.:  [id number]  LOCATION:  [id number]                        FACILITY:  [facility]  PHYSICIAN:  Elnoria Livingston C. Ophelia CharterYates, M.D.    DATE OF BIRTH:  12/29/1940  DATE OF PROCEDURE:  05/07/2014 DATE OF DISCHARGE:                              OPERATIVE REPORT   PREOPERATIVE DIAGNOSIS:  Displaced left femoral neck fracture.  POSTOPERATIVE DIAGNOSIS:  Displaced left femoral neck fracture.  PROCEDURE:  Left DePuy Press-Fit monopolar hemiarthroplasty, +0 neck, #3 stem, 47 mm ball.  SURGEON:  Sanyla Summey C. Ophelia CharterYates, MD.  ESTIMATED BLOOD LOSS:  200 mL.  DRAINS:  None.  INDICATIONS:  This 73 year old female who has had Bjork-Sheily valve  for many years, is on full anticoagulation with Coumadin and fell suffering hip fracture and had to have her anticoagulation reversed.  Protime was 1.67, and she was brought to the operating room for hemiarthroplasty for displaced left femoral neck fracture.  PROCEDURE IN DETAIL:  After induction of anesthesia, Ancef prophylaxis, lateral positioning, an axillary roll, prepping with DuraPrep, usual hip sheets, drapes, sterile skin marker, impervious stockinette, Coban, and Betadine, Steri-Drape x2 was used to seal the skin.  Time-out procedure completed.  Posterior approach was made.  Channel retractor placed. Piriformis cut, tagged for later repair.  Posterior capsule opened. Head was easily removed.  Neck was cut 1 fingerbreadth above the lesser trochanter.  Sequential preparation of the canal.  No evidence of pathologic lesion.  Sequential broaching.  Head was measured 47, gave nice tight suction fit with the trial.  A +0 trial gave restoration of leg length.  Good stability flexion on 90 degrees, internal rotation 80 degrees with adduction without dislocation.  Marney DoctorBroach was removed. Permanent prosthesis was inserted +0 neck, the monopolar head.  No bone was placed in the acetabulum.  Sciatic nerve was  carefully protected and the hip was popped in.  Piriformis repaired the gluteus medius, identical findings of stability.  Posterior capsule was reapproximated with #1 Ethibond.  Tensor fascia was closed with #1 Ethibond.  A 2-0 Vicryl in subcutaneous tissue, skin staple closure, postop dressing, and knee immobilizer.     Kennan Detter C. Ophelia CharterYates, M.D.    MCY/MEDQ  D:  05/08/2014  T:  05/08/2014  Job:  [id number]

## 2014-05-08 NOTE — Progress Notes (Signed)
Pt seen and examined with Dr. Danella Pentonruong. Case d/w residents in detail. I agree with  findings and plan as outlined in her note.  Pt is now s/p L hip arthroplasty. Will c/w pain control, PT/OT.   Pt also noted to have acute blood loss anemia secondary to operation. Would recheck CBC in AM  C/w abx for UTI  Resumed heparin gtt for a/c for Pafib till INR therapeutic

## 2014-05-08 NOTE — Progress Notes (Signed)
Subjective: Pt no oriented to time or place. Complaining of lower back pain.   Objective: Vital signs in last 24 hours: Filed Vitals:   05/07/14 2031 05/08/14 0000 05/08/14 0558 05/08/14 0800  BP:   151/61   Pulse:   72   Temp:   98.2 F (36.8 C)   TempSrc:   Oral   Resp:  18 18 16   Height:      Weight:      SpO2: 94% 95% 99% 99%   Weight change:   Intake/Output Summary (Last 24 hours) at 05/08/14 1047 Last data filed at 05/08/14 0930  Gross per 24 hour  Intake   2440 ml  Output    800 ml  Net   1640 ml   General: NAD, sitting upright eating breakfast HEENT: moist mucous membranes Cardiac: RRR, no murmurs Lungs: CTAB GI: active bowel sounds, soft Ext: left leg brace in place   Lab Results: Basic Metabolic Panel:  Recent Labs Lab 05/06/14 0535 05/08/14 0456  NA 136 132*  K 4.4 4.1  CL 104 101  CO2 27 27  GLUCOSE 160* 136*  BUN 11 9  CREATININE 0.76 0.59  CALCIUM 9.1 8.3*   CBC:  Recent Labs Lab 05/05/14 2142  05/07/14 0533 05/08/14 0456  WBC 17.1*  < > 15.5* 10.2  NEUTROABS 14.6*  --  13.1*  --   HGB 15.4*  < > 14.0 11.1*  HCT 44.7  < > 41.6 33.0*  MCV 88.3  < > 89.1 90.7  PLT 215  < > 184 159  < > = values in this interval not displayed. Coagulation:  Recent Labs Lab 05/05/14 2142 05/06/14 0535 05/07/14 0533 05/08/14 0456  LABPROT 29.8* 32.4* 19.9* 17.9*  INR 2.81* 3.12* 1.67* 1.46   Urinalysis:  Recent Labs Lab 05/06/14 0231  COLORURINE YELLOW  LABSPEC 1.020  PHURINE 6.0  GLUCOSEU NEGATIVE  HGBUR SMALL*  BILIRUBINUR NEGATIVE  KETONESUR 15*  PROTEINUR NEGATIVE  UROBILINOGEN 0.2  NITRITE POSITIVE*  LEUKOCYTESUR MODERATE*    Studies/Results: Pelvis Portable  05/07/2014   CLINICAL DATA:  Post close reduction of dislocated right hip prosthesis.  EXAM: PORTABLE PELVIS 1-2 VIEWS  COMPARISON:  05/05/2014  FINDINGS: Patient is status post left hip replacement. Normal AP alignment. No evidence for dislocation. No acute bony  abnormality.  IMPRESSION: Left hip replacement.  Normal AP alignment.   Electronically Signed   By: Charlett NoseKevin  Dover M.D.   On: 05/07/2014 19:37   Medications: I have reviewed the patient's current medications. Scheduled Meds: . atorvastatin  20 mg Oral Daily  . buPROPion  75 mg Oral Daily  . cefTRIAXone (ROCEPHIN)  IV  1 g Intravenous Daily  . cyanocobalamin  500 mcg Oral Daily  . docusate sodium  100 mg Oral BID  . escitalopram  20 mg Oral Daily  . lisinopril  10 mg Oral Daily  . metoprolol  75 mg Oral BID  . temazepam  30 mg Oral QHS  . warfarin  5 mg Oral ONCE-1800  . Warfarin - Pharmacist Dosing Inpatient   Does not apply q1800   Continuous Infusions: . dextrose 5 % and 0.45% NaCl 100 mL/hr at 05/08/14 0207  . heparin 950 Units/hr (05/08/14 0703)  . lactated ringers 10 mL/hr at 05/07/14 1713   PRN Meds:.acetaminophen **OR** acetaminophen, HYDROcodone-acetaminophen, HYDROmorphone (DILAUDID) injection, LORazepam, menthol-cetylpyridinium **OR** phenol, methocarbamol, metoCLOPramide **OR** metoCLOPramide (REGLAN) injection, ondansetron **OR** ondansetron (ZOFRAN) IV Assessment/Plan: Principal Problem:   Displaced fracture of left femoral neck  Active Problems:   Essential hypertension, benign   Atrial fibrillation   MITRAL VALVE REPLACEMENT, HX OF   Dementia   Leukocytosis   Fracture of femoral neck, left   Left Femoral neck fracture- day 1 s/p lt him monopolar arthroplasty w/ Dr. Ophelia CharterYates - pain management w/ dialudid 0.5mg  q3hr PRN and norco 5-325mg  q6h prn - resuming heparin and coumadin today per pharm - hgb 11.1 today from 14 yesterday, CBC tomorrow morning  UTI-- asymptomatic. UA positive for TNTC WBC, nitrites, LE, and many bacteria. Likely cause of leukocytosis.  - urine culture reveal e.coli, sensitivities pending - cont rocephin IV day 4, can transition to keflex for a total of 7 days of abx tx on d/c  Paroxysmal Afib - on coumadin at home.  - pharmacy resuming hep and  coumadin today, INR 1.46 today - cont metoprolol.  HTN - BP 151/61 today.  At home takes lisinopril 10mg , metoprolol 75mg  BID - will continue home meds and monitor   Depression - cont lexapro Anxiety - cont bupropion Insomnia - cont restoril.    Dispo: Disposition is deferred at this time, awaiting improvement of current medical problems. Anticipated discharge in approximately 1-2 day(s).   The patient does have a current PCP Lorie Phenix(Nancy Maloney, MD) and [facility] after discharge.  The patient does have transportation limitations that hinder transportation to clinic appointments.  .Services Needed at time of discharge: Y = Yes, Blank = No PT:   OT:   RN:   Equipment:   Other:     LOS: 3 days   Gara Kroneriana Deshonda Cryderman, MD 05/08/2014, 10:47 AM

## 2014-05-08 NOTE — Progress Notes (Addendum)
ANTICOAGULATION CONSULT NOTE - Follow Up Consult  Pharmacy Consult for Heparin Indication: atrial fibrillation  Allergies  Allergen Reactions  . Codeine Other (See Comments)    Patient Measurements: Height: 5\' 7"  (170.2 cm) Weight: 137 lb 3.2 oz (62.234 kg) IBW/kg (Calculated) : 61.6  Vital Signs: Temp: 98.4 F (36.9 C) (12/27 1330) Temp Source: Oral (12/27 1330) BP: 135/55 mmHg (12/27 1330) Pulse Rate: 71 (12/27 1330)  Labs:  Recent Labs  05/05/14 2142 05/06/14 0535 05/07/14 0533 05/08/14 0456 05/08/14 1603  HGB 15.4* 15.0 14.0 11.1*  --   HCT 44.7 44.5 41.6 33.0*  --   PLT 215 193 184 159  --   LABPROT 29.8* 32.4* 19.9* 17.9*  --   INR 2.81* 3.12* 1.67* 1.46  --   HEPARINUNFRC  --   --   --   --  0.15*  CREATININE 0.72 0.76  --  0.59  --     Estimated Creatinine Clearance: 60.9 mL/min (by C-G formula based on Cr of 0.59).  Assessment: 73 yo F presents on 12/24 with L hip pain after fall. Larey SeatFell this morning and landed on her left side. Xray showed mildly displaced subcapital fracture through the left femoral neck. Pt was transitioned off of coumadin and bridged with heparin to prepare for surgery. Now status post surgery with INR 1.46. Per orthopedic surgeon, resume heparin and coumadin today. Hgb trending down post-op to 11.1 from 14.0, plts 159. No apparent s/s of bleed.  HL is subtherapeutic at 0.15 on heparin 950 units/hr. No bleeding was noted by the nurse.  Goal of Therapy:  INR 2-3 Heparin level 0.3-0.7 units/ml Monitor platelets by anticoagulation protocol: Yes   Plan:  Bolus 1500 units of heparin Increase heparin gtt 1100 units/hr Check 8h HL Monitor daily HL, INR, CBC, s/s of bleed  Arlean Hoppingorey M. Newman PiesBall, PharmD Clinical Pharmacist Pager [phone] 05/08/2014,5:12 PM

## 2014-05-09 DIAGNOSIS — W19XXXD Unspecified fall, subsequent encounter: Secondary | ICD-10-CM

## 2014-05-09 DIAGNOSIS — F418 Other specified anxiety disorders: Secondary | ICD-10-CM

## 2014-05-09 DIAGNOSIS — G4700 Insomnia, unspecified: Secondary | ICD-10-CM

## 2014-05-09 DIAGNOSIS — B9629 Other Escherichia coli [E. coli] as the cause of diseases classified elsewhere: Secondary | ICD-10-CM

## 2014-05-09 DIAGNOSIS — E876 Hypokalemia: Secondary | ICD-10-CM

## 2014-05-09 DIAGNOSIS — S72002D Fracture of unspecified part of neck of left femur, subsequent encounter for closed fracture with routine healing: Secondary | ICD-10-CM

## 2014-05-09 LAB — PROTIME-INR
INR: 1.49 (ref 0.00–1.49)
PROTHROMBIN TIME: 18.2 s — AB (ref 11.6–15.2)

## 2014-05-09 LAB — BASIC METABOLIC PANEL
ANION GAP: 7 (ref 5–15)
BUN: 7 mg/dL (ref 6–23)
CO2: 28 mmol/L (ref 19–32)
Calcium: 8.3 mg/dL — ABNORMAL LOW (ref 8.4–10.5)
Chloride: 97 mEq/L (ref 96–112)
Creatinine, Ser: 0.56 mg/dL (ref 0.50–1.10)
GFR calc Af Amer: >90 mL/min (ref >90)
GFR, EST NON AFRICAN AMERICAN: 90 mL/min — AB (ref >90)
Glucose, Bld: 116 mg/dL — ABNORMAL HIGH (ref 70–99)
POTASSIUM: 3.3 mmol/L — AB (ref 3.5–5.1)
SODIUM: 132 mmol/L — AB (ref 135–145)

## 2014-05-09 LAB — CBC
HCT: 30.1 % — ABNORMAL LOW (ref 36.0–46.0)
Hemoglobin: 10.2 g/dL — ABNORMAL LOW (ref 12.0–15.0)
MCH: 29.4 pg (ref 26.0–34.0)
MCHC: 33.9 g/dL (ref 30.0–36.0)
MCV: 86.7 fL (ref 78.0–100.0)
PLATELETS: 175 10*3/uL (ref 150–400)
RBC: 3.47 MIL/uL — ABNORMAL LOW (ref 3.87–5.11)
RDW: 13 % (ref 11.5–15.5)
WBC: 10.9 10*3/uL — ABNORMAL HIGH (ref 4.0–10.5)

## 2014-05-09 LAB — HEPARIN LEVEL (UNFRACTIONATED)
Heparin Unfractionated: 0.26 IU/mL — ABNORMAL LOW (ref 0.30–0.70)
Heparin Unfractionated: 0.48 IU/mL (ref 0.30–0.70)

## 2014-05-09 MED ORDER — CEPHALEXIN 500 MG PO CAPS
500.0000 mg | ORAL_CAPSULE | Freq: Two times a day (BID) | ORAL | Status: DC
  Administered 2014-05-09 – 2014-05-10 (×4): 500 mg via ORAL
  Filled 2014-05-09 (×7): qty 1

## 2014-05-09 MED ORDER — ENOXAPARIN SODIUM 60 MG/0.6ML ~~LOC~~ SOLN
60.0000 mg | Freq: Two times a day (BID) | SUBCUTANEOUS | Status: DC
  Administered 2014-05-09 – 2014-05-11 (×4): 60 mg via SUBCUTANEOUS
  Filled 2014-05-09 (×7): qty 0.6

## 2014-05-09 MED ORDER — POTASSIUM CHLORIDE CRYS ER 20 MEQ PO TBCR
40.0000 meq | EXTENDED_RELEASE_TABLET | Freq: Once | ORAL | Status: AC
  Administered 2014-05-09: 40 meq via ORAL
  Filled 2014-05-09 (×2): qty 2

## 2014-05-09 MED ORDER — WARFARIN SODIUM 7.5 MG PO TABS
7.5000 mg | ORAL_TABLET | Freq: Once | ORAL | Status: AC
  Administered 2014-05-09: 7.5 mg via ORAL
  Filled 2014-05-09: qty 1

## 2014-05-09 MED ORDER — METHOCARBAMOL 500 MG PO TABS
500.0000 mg | ORAL_TABLET | Freq: Four times a day (QID) | ORAL | Status: DC
Start: 2014-05-09 — End: 2015-06-09

## 2014-05-09 MED ORDER — HYDROCODONE-ACETAMINOPHEN 5-325 MG PO TABS: 1.0000 | ORAL_TABLET | Freq: Four times a day (QID) | ORAL | Status: DC | PRN

## 2014-05-09 NOTE — Progress Notes (Signed)
Subjective: Patient only oriented x 1. Denies any pain in her hip or leg.   Objective: Vital signs in last 24 hours: Filed Vitals:   05/08/14 0800 05/08/14 1330 05/08/14 1948 05/09/14 0611  BP:  135/55 165/61 125/57  Pulse:  71 80 77  Temp:  98.4 F (36.9 C) 98.5 F (36.9 C) 98.7 F (37.1 C)  TempSrc:  Oral Oral Oral  Resp: 16 18 17 18   Height:      Weight:      SpO2: 99% 94% 96% 93%   Weight change:   Intake/Output Summary (Last 24 hours) at 05/09/14 0848 Last data filed at 05/09/14 0800  Gross per 24 hour  Intake    350 ml  Output   1000 ml  Net   -650 ml   Physical Exam General: alert, resting in bed, NAD HEENT: [place]/AT, EOMI, sclera anicteric, mucus membranes moist CV: mechanical valve murmur Pulm: CTA bilaterally, breaths non-labored Abd: BS+, soft, nontender Ext: left lower extremity in brace, distal pulses 2+ Neuro: alert and oriented to person only, CNs II-XII intact  Lab Results: Basic Metabolic Panel:  Recent Labs Lab 05/08/14 0456 05/09/14 0250  NA 132* 132*  K 4.1 3.3*  CL 101 97  CO2 27 28  GLUCOSE 136* 116*  BUN 9 7  CREATININE 0.59 0.56  CALCIUM 8.3* 8.3*   Liver Function Tests: No results for input(s): AST, ALT, ALKPHOS, BILITOT, PROT, ALBUMIN in the last 168 hours. No results for input(s): LIPASE, AMYLASE in the last 168 hours. No results for input(s): AMMONIA in the last 168 hours. CBC:  Recent Labs Lab 05/05/14 2142  05/07/14 0533 05/08/14 0456 05/09/14 0250  WBC 17.1*  < > 15.5* 10.2 10.9*  NEUTROABS 14.6*  --  13.1*  --   --   HGB 15.4*  < > 14.0 11.1* 10.2*  HCT 44.7  < > 41.6 33.0* 30.1*  MCV 88.3  < > 89.1 90.7 86.7  PLT 215  < > 184 159 175  < > = values in this interval not displayed. Cardiac Enzymes: No results for input(s): CKTOTAL, CKMB, CKMBINDEX, TROPONINI in the last 168 hours. BNP: No results for input(s): PROBNP in the last 168 hours. D-Dimer: No results for input(s): DDIMER in the last 168  hours. CBG: No results for input(s): GLUCAP in the last 168 hours. Hemoglobin A1C: No results for input(s): HGBA1C in the last 168 hours. Fasting Lipid Panel: No results for input(s): CHOL, HDL, LDLCALC, TRIG, CHOLHDL, LDLDIRECT in the last 168 hours. Thyroid Function Tests: No results for input(s): TSH, T4TOTAL, FREET4, T3FREE, THYROIDAB in the last 168 hours. Coagulation:  Recent Labs Lab 05/06/14 0535 05/07/14 0533 05/08/14 0456 05/09/14 0250  LABPROT 32.4* 19.9* 17.9* 18.2*  INR 3.12* 1.67* 1.46 1.49   Anemia Panel: No results for input(s): VITAMINB12, FOLATE, FERRITIN, TIBC, IRON, RETICCTPCT in the last 168 hours. Urine Drug Screen: Drugs of Abuse  No results found for: LABOPIA, COCAINSCRNUR, LABBENZ, AMPHETMU, THCU, LABBARB  Alcohol Level: No results for input(s): ETH in the last 168 hours. Urinalysis:  Recent Labs Lab 05/06/14 0231  COLORURINE YELLOW  LABSPEC 1.020  PHURINE 6.0  GLUCOSEU NEGATIVE  HGBUR SMALL*  BILIRUBINUR NEGATIVE  KETONESUR 15*  PROTEINUR NEGATIVE  UROBILINOGEN 0.2  NITRITE POSITIVE*  LEUKOCYTESUR MODERATE*     Micro Results: Recent Results (from the past 240 hour(s))  Culture, Urine     Status: None   Collection Time: 05/06/14  2:31 AM  Result Value Ref  Range Status   Specimen Description URINE, CATHETERIZED  Final   Special Requests NONE  Final   Colony Count   Final    >=100,000 COLONIES/ML Performed at [facility]    Culture   Final    ESCHERICHIA COLI Performed at [facility]    Report Status 05/08/2014 FINAL  Final   Organism ID, Bacteria ESCHERICHIA COLI  Final      Susceptibility   Escherichia coli - MIC*    AMPICILLIN <=2 SENSITIVE Sensitive     CEFAZOLIN <=4 SENSITIVE Sensitive     CEFTRIAXONE <=1 SENSITIVE Sensitive     CIPROFLOXACIN <=0.25 SENSITIVE Sensitive     GENTAMICIN <=1 SENSITIVE Sensitive     LEVOFLOXACIN <=0.12 SENSITIVE Sensitive     NITROFURANTOIN <=16 SENSITIVE Sensitive      TOBRAMYCIN <=1 SENSITIVE Sensitive     TRIMETH/SULFA <=20 SENSITIVE Sensitive     PIP/TAZO <=4 SENSITIVE Sensitive     * ESCHERICHIA COLI  Surgical pcr screen     Status: None   Collection Time: 05/07/14 12:33 PM  Result Value Ref Range Status   MRSA, PCR NEGATIVE NEGATIVE Final   Staphylococcus aureus NEGATIVE NEGATIVE Final    Comment:        The Xpert SA Assay (FDA approved for NASAL specimens in patients over 19 years of age), is one component of a comprehensive surveillance program.  Test performance has been validated by [employer] for patients greater than or equal to 9 year old. It is not intended to diagnose infection nor to guide or monitor treatment.    Studies/Results: Pelvis Portable  05/07/2014   CLINICAL DATA:  Post close reduction of dislocated right hip prosthesis.  EXAM: PORTABLE PELVIS 1-2 VIEWS  COMPARISON:  05/05/2014  FINDINGS: Patient is status post left hip replacement. Normal AP alignment. No evidence for dislocation. No acute bony abnormality.  IMPRESSION: Left hip replacement.  Normal AP alignment.   Electronically Signed   By: Charlett Nose M.D.   On: 05/07/2014 19:37   Medications: I have reviewed the patient's current medications. Scheduled Meds: . atorvastatin  20 mg Oral Daily  . buPROPion  75 mg Oral Daily  . cefTRIAXone (ROCEPHIN)  IV  1 g Intravenous Daily  . cyanocobalamin  500 mcg Oral Daily  . docusate sodium  100 mg Oral BID  . escitalopram  20 mg Oral Daily  . lisinopril  10 mg Oral Daily  . metoprolol  75 mg Oral BID  . potassium chloride  40 mEq Oral Once  . temazepam  30 mg Oral QHS  . warfarin  5 mg Oral ONCE-1800  . Warfarin - Pharmacist Dosing Inpatient   Does not apply q1800   Continuous Infusions: . dextrose 5 % and 0.45% NaCl 100 mL/hr at 05/08/14 0207  . heparin 1,250 Units/hr (05/09/14 0349)   PRN Meds:.acetaminophen **OR** acetaminophen, HYDROcodone-acetaminophen, HYDROmorphone (DILAUDID) injection, LORazepam,  menthol-cetylpyridinium **OR** phenol, methocarbamol, ondansetron **OR** ondansetron (ZOFRAN) IV Assessment/Plan: Principal Problem:   Displaced fracture of left femoral neck Active Problems:   Essential hypertension, benign   Atrial fibrillation   MITRAL VALVE REPLACEMENT, HX OF   Dementia   Leukocytosis   Fracture of femoral neck, left  Left Femoral neck fracture- postop day 2 s/p left hip monopolar arthroplasty w/ Dr. Ophelia Charter - Pain management w/ dialudid 0.5mg  q3hr PRN and norco 5-325mg  q6h prn, pain controlled  - Continue heparin and coumadin per pharmacy - Hgb 10.2 today from 11.1 yesterday, CBC tomorrow morning -  PT/OT consulted--> will need home health PT/OT and rolling walker upon discharge   UTI-- asymptomatic. UA positive for TNTC WBC, nitrites, LE, and many bacteria. Likely cause of leukocytosis.  - Urine culture growing E.coli, pansensitive - Stop Rocephin, switch to to Keflex 500 mg Q12H for a total of 7 days (start date: 05/09/14)  Hypokalemia: K 3.3 today. - Repleted with K-Dur 40 mEq  Paroxysmal Afib - on coumadin at home.  - Continue heparin and coumadin per pharmacy, INR 1.49 today - Continue Metoprolol 75 mg BID  HTN - At home takes lisinopril 10mg , metoprolol 75mg  BID - Continue home meds and monitor  Depression  - Continue Lexapro 20 mg daily  Anxiety  - Continue Bupropion 75 mg daily  Insomnia  - Continue Restoril 30 mg QHS  Diet: Heart healthy VTE PPx: Heparin and Coumadin  Dispo: Disposition is deferred at this time, awaiting improvement of current medical problems.  Anticipated discharge in approximately 1-2 day(s).   The patient does have a current PCP Lorie Phenix(Nancy Maloney, MD) and [facility] after discharge.  The patient does not have transportation limitations that hinder transportation to clinic appointments.  .Services Needed at time of discharge: Y = Yes, Blank = No PT:   OT:   RN:   Equipment:     Other:     LOS: 4 days   Rich Numberarly Rivet, MD 05/09/2014, 8:48 AM

## 2014-05-09 NOTE — Progress Notes (Signed)
Occupational Therapy Treatment Patient Details Name: Jonni SangerCecilia R Freedman MRN: [id number] DOB: 21-Aug-1940 Today's Date: 05/09/2014    History of present illness Jonni SangerCecilia R. Selsor is a 73 y.o. female admitted 05/05/14 after a fall at home resulting in left displaced femoral neck fx. Pt s/p L arthroplasty monopolar hip on 05/07/14. PMH:    OT comments  Pt. Progressing with skilled OT.  Still requiring cues for safety and sequencing.  Agree with current recommendations for HHOT and 24/7S.    Follow Up Recommendations  Home health OT;Supervision/Assistance - 24 hour    Equipment Recommendations  3 in 1 bedside comode    Recommendations for Other Services      Precautions / Restrictions Precautions Precautions: Posterior Hip;Fall Required Braces or Orthoses: Knee Immobilizer - Left Restrictions Weight Bearing Restrictions: Yes LLE Weight Bearing: Weight bearing as tolerated       Mobility Bed Mobility Overal bed mobility: Needs Assistance Bed Mobility: Supine to Sit     Supine to sit: Min guard;HOB elevated     General bed mobility comments: VC's for sequencing, however pt able to complete with S and Use of bed rails.   Transfers Overall transfer level: Needs assistance Equipment used: Rolling walker (2 wheeled) Transfers: Sit to/from UGI CorporationStand;Stand Pivot Transfers Sit to Stand: Min assist Stand pivot transfers: Min assist       General transfer comment: Min (A) for safety. VC's for sequencing    Balance                                   ADL Overall ADL's : Needs assistance/impaired                         Toilet Transfer: Minimal assistance;RW;Stand-pivot Toilet Transfer Details (indicate cue type and reason): simulated during stand pivot transfer from eob to recliner Toileting- Clothing Manipulation and Hygiene: Minimal assistance;Sitting/lateral lean;Sit to/from stand Toileting - Clothing Manipulation Details (indicate cue type and  reason): simulated during transfer from eob to recliner     Functional mobility during ADLs: Minimal assistance;Rolling walker General ADL Comments: pt. following instructions this session and able to complete bed mobility and pivot transfer      Vision                     Perception     Praxis      Cognition   Behavior During Therapy: Flat affect Overall Cognitive Status: History of cognitive impairments - at baseline                       Extremity/Trunk Assessment               Exercises     Shoulder Instructions       General Comments      Pertinent Vitals/ Pain       Pain Assessment: No/denies pain  Home Living                                          Prior Functioning/Environment              Frequency Min 2X/week     Progress Toward Goals  OT Goals(current goals can now be found in the care plan section)  Progress towards OT  goals: Progressing toward goals     Plan Discharge plan remains appropriate    Co-evaluation                 End of Session Equipment Utilized During Treatment: Rolling walker;Left knee immobilizer   Activity Tolerance Patient tolerated treatment well   Patient Left in chair;with call bell/phone within reach;with chair alarm set;with nursing/sitter in room   Nurse Communication          Time: [id number] OT Time Calculation (min): 12 min  Charges: OT General Charges $OT Visit: 1 Procedure OT Treatments $Self Care/Home Management : 8-22 mins  Robet LeuMorris, Farron Lafond Lorraine, COTA/L 05/09/2014, 10:51 AM

## 2014-05-09 NOTE — Progress Notes (Signed)
Subjective Patient doing well.  Pain controlled.   Objective: Vital signs in last 24 hours: Temp:  [98.4 F (36.9 C)-98.7 F (37.1 C)] 98.7 F (37.1 C) (12/28 44010611) Pulse Rate:  [71-80] 77 (12/28 0611) Resp:  [17-18] 18 (12/28 0800) BP: (125-165)/(55-61) 125/57 mmHg (12/28 0611) SpO2:  [93 %-96 %] 95 % (12/28 0800)  Intake/Output from previous day: 12/27 0701 - 12/28 0700 In: 350 [P.O.:300; IV Piggyback:50] Out: 800 [Urine:800] Intake/Output this shift: Total I/O In: -  Out: 200 [Urine:200]   Recent Labs  05/07/14 0533 05/08/14 0456 05/09/14 0250  HGB 14.0 11.1* 10.2*    Recent Labs  05/08/14 0456 05/09/14 0250  WBC 10.2 10.9*  RBC 3.64* 3.47*  HCT 33.0* 30.1*  PLT 159 175    Recent Labs  05/08/14 0456 05/09/14 0250  NA 132* 132*  K 4.1 3.3*  CL 101 97  CO2 27 28  BUN 9 7  CREATININE 0.59 0.56  GLUCOSE 136* 116*  CALCIUM 8.3* 8.3*    Recent Labs  05/08/14 0456 05/09/14 0250  INR 1.46 1.49   Exam.  Hip wound looks good.  Staples intact.  No signs of infection.  Calf nontender.   Assessment/Plan: Stable from ortho standpoint.  D/c planning per medical service.     Calyssa Zobrist M 05/09/2014, 10:04 AM

## 2014-05-09 NOTE — Progress Notes (Signed)
Physical Therapy Treatment Patient Details Name: [name] [name] MRN: [id number] DOB: 1940/12/25 Today's Date: 05/09/2014    History of Present Illness [name] [name] is a 73 y.o. female admitted 05/05/14 after a fall at home resulting in left displaced femoral neck fx. Pt s/p L arthroplasty monopolar hip on 05/07/14. PMH:     PT Comments    Overall, [name] [name] is moving quite well; She will need constant supervision for posterior hip precautions, though using the knee immobilizer will help with adherence; I favor dc'ing home to familiar environment and routine;  Will plan to stair train next session   Follow Up Recommendations  Home health PT;Supervision/Assistance - 24 hour (and I encourage pt's husband to get help from family prn)     Equipment Recommendations  Rolling walker with 5" wheels;3in1 (PT)    Recommendations for Other Services       Precautions / Restrictions Precautions Precautions: Posterior Hip Precaution Booklet Issued: Yes (comment) Precaution Comments: reinforced post hip prec Required Braces or Orthoses: Knee Immobilizer - Left Knee Immobilizer - Left:  (advising that pt wear at all times to increase adherence to posterior hip prec) Restrictions LLE Weight Bearing: Weight bearing as tolerated    Mobility  Bed Mobility Overal bed mobility: Needs Assistance Bed Mobility: Supine to Sit     Supine to sit: Min guard;HOB elevated     General bed mobility comments: VC's for sequencing, however pt able to complete with S and Use of bed rails.   Transfers Overall transfer level: Needs assistance Equipment used: Rolling walker (2 wheeled) Transfers: Sit to/from Stand Sit to Stand: Min guard Stand pivot transfers: Min assist       General transfer comment: Cues for hand placement and LLE placement for transfers; not needing  physical assist  Ambulation/Gait Ambulation/Gait assistance: Min guard Ambulation Distance (Feet): 80  Feet Assistive device: Rolling walker (2 wheeled) Gait Pattern/deviations: Step-through pattern Gait velocity: slowed   General Gait Details: Cues for hip precautions with turns, and to "make sure your toes are pointing where you're going"   Stairs            Wheelchair Mobility    Modified Rankin (Stroke Patients Only)       Balance                                    Cognition Arousal/Alertness: Awake/alert Behavior During Therapy: Flat affect Overall Cognitive Status: History of cognitive impairments - at baseline                      Exercises      General Comments        Pertinent Vitals/Pain Pain Assessment: 0-10 Pain Score: 9  Pain Location: L hip Pain Descriptors / Indicators: Aching Pain Intervention(s): Limited activity within patient's tolerance;Monitored during session;Repositioned;Patient requesting pain meds-RN notified    Home Living                      Prior Function            PT Goals (current goals can now be found in the care plan section) Acute Rehab PT Goals Patient Stated Goal: immediate goal to get to bathroom PT Goal Formulation: With patient/family Time For Goal Achievement: 05/22/14 Potential to Achieve Goals: Good Progress towards PT goals: Progressing toward goals    Frequency  Min 6X/week  PT Plan Current plan remains appropriate    Co-evaluation             End of Session Equipment Utilized During Treatment: Gait belt Activity Tolerance: Patient tolerated treatment well Patient left: in chair;with call bell/phone within reach;with chair alarm set     Time: [id number] PT Time Calculation (min) (ACUTE ONLY): 20 min  Charges:  $Gait Training: 8-22 mins                    G Codes:      Olen PelGarrigan, Starlette Thurow Hamff 05/09/2014, 1:31 PM  Van ClinesHolly Thanvi Blincoe, [place]  Acute Rehabilitation Services Pager [phone] Office [phone]

## 2014-05-09 NOTE — Progress Notes (Signed)
Physical Therapy Treatment Patient Details Name: [name] [name] MRN: [id number] DOB: Oct 04, 1940 Today's Date: 05/09/2014    History of Present Illness [name] SangerCecilia R. [name] is a 73 y.o. female admitted 05/05/14 after a fall at home resulting in left displaced femoral neck fx. Pt s/p L arthroplasty monopolar hip on 05/07/14. PMH:     PT Comments    Continuing progress, including stair training -- pt in more pain this afternoon, RN notified;   Safe dc home is contingent upon pt listening to and following husband's cues for Posterior Hip precautions; Unfortunately today pt has been quite resistant to much of what her husband has to offer for her to move safely within the Posterior Hip precautions;   Post-acute rehab at SNF level will likely serve [name] [name] better for a safe dc plan; Dr. Beckie Saltsivet updated, and Corrie DandyMary, Case Mgr notified   Follow Up Recommendations  SNF;Supervision/Assistance - 24 hour     Equipment Recommendations  Rolling walker with 5" wheels;3in1 (PT)    Recommendations for Other Services       Precautions / Restrictions Precautions Precautions: Posterior Hip Precaution Booklet Issued: Yes (comment) Precaution Comments: reinforced post hip prec with husband Required Braces or Orthoses: Knee Immobilizer - Left Knee Immobilizer - Left:  (advising that pt wear at all times to increase adherence to Post Hip prec ) Restrictions LLE Weight Bearing: Weight bearing as tolerated    Mobility  Bed Mobility                  Transfers Overall transfer level: Needs assistance Equipment used: Rolling walker (2 wheeled) Transfers: Sit to/from Stand Sit to Stand: Min guard         General transfer comment: Cues for hand placement and LLE placement for transfers; not needing  physical assist  Ambulation/Gait Ambulation/Gait assistance: Min guard Ambulation Distance (Feet): 20 Feet Assistive device: Rolling walker (2 wheeled) Gait Pattern/deviations:  Step-to pattern Gait velocity: slowed   General Gait Details: Cues for hip precautions with turns, and to "make sure your toes are pointing where you're going"   Stairs Stairs: Yes Stairs assistance: Min guard Stair Management: No rails;Backwards;With walker Number of Stairs: 2 General stair comments: Verbal step-by-step cues for technique; Pt able to manage steps relatively well with step-to technqiue  Wheelchair Mobility    Modified Rankin (Stroke Patients Only)       Balance                                    Cognition Arousal/Alertness: Awake/alert Behavior During Therapy: Flat affect Overall Cognitive Status: History of cognitive impairments - at baseline       Memory:  (Husband needed reinforcement of Hip Prec)              Exercises      General Comments        Pertinent Vitals/Pain Pain Assessment: Faces Pain Score: 9  Faces Pain Scale: Hurts even more Pain Location: L hip Pain Descriptors / Indicators: Aching Pain Intervention(s): Patient requesting pain meds-RN notified;Repositioned    Home Living                      Prior Function            PT Goals (current goals can now be found in the care plan section) Acute Rehab PT Goals Patient Stated Goal: did not  state PT Goal Formulation: With patient/family Time For Goal Achievement: 05/22/14 Potential to Achieve Goals: Good Progress towards PT goals: Progressing toward goals    Frequency  Min 5X/week    PT Plan Discharge plan needs to be updated;Frequency needs to be updated    Co-evaluation             End of Session Equipment Utilized During Treatment: Gait belt Activity Tolerance: Patient tolerated treatment well Patient left: in chair;with call bell/phone within reach;with chair alarm set     Time: 1445-1517 PT Time Calculation (min) (ACUTE ONLY): 32 min  Charges:  $Gait Training: 23-37 mins                    G Codes:      Olen PelGarrigan, Raea Magallon  Hamff 05/09/2014, 4:46 PM  Van ClinesHolly Issiac Jamar, [place]  Acute Rehabilitation Services Pager [phone] Office [phone]

## 2014-05-09 NOTE — Progress Notes (Signed)
Internal Medicine Attending  Date: 05/09/2014  Patient name: [name] [name] Medical record number: [id number] Date of birth: 1940-05-28 Age: 73 y.o. Gender: female  I saw and evaluated the patient. I discussed patient and reviewed the resident's note by Dr. Beckie Saltsivet, and I agree with the resident's findings and plans as documented in her note, with the following additional comments.  Physical therapy is recommending rehabilitation at SNF level as a better option for discharge; patient and her husband are in agreement with this plan.

## 2014-05-09 NOTE — Progress Notes (Signed)
CARE MANAGEMENT NOTE 05/09/2014  Patient:  Jonni SangerMURRELL,Mikahla R   Account Number:  [id number]  Date Initiated:  05/09/2014  Documentation initiated by:  [facility]RIEG,Zakhia Seres  Subjective/Objective Assessment:   left femoral neck fracture, s/p left hip arthroplasty     Action/Plan:   PT/OT evals-recommended HHPT,HHOT, rolling walker and 3N1   Anticipated DC Date:  05/09/2014   Anticipated DC Plan:  HOME W HOME HEALTH SERVICES      DC Planning Services  CM consult      [facility]  HOME HEALTH   Choice offered to / List presented to:  C-3 Spouse   DME arranged  3-N-1  WALKER - ROLLING      DME agency  [employer]     HH arranged  HH-2 PT  HH-3 OT      HH agency  [facility]   Status of service:  Completed, signed off Medicare Important Message given?  YES (If response is "NO", the following Medicare IM given date fields will be blank) Date Medicare IM given:  05/09/2014 Medicare IM given by:  [facility]Iceis Knab Date Additional Medicare IM given:   Additional Medicare IM given by:    Discharge Disposition:  HOME W HOME HEALTH SERVICES  Per UR Regulation:  Reviewed for med. necessity/level of care/duration of stay  If discussed at Long Length of Stay Meetings, dates discussed:    Comments:  05/09/14 Spoke with patient and her husband about HHC. Mr Rada HayMurrell chose [facility]. Contacted [facility] at [facility] and set up HHPT and HHOT. Contacted Brent with [employer], rolling walker and 3N1 delivered to patient's room. Jacquelynn CreeMary Anjelo Pullman RN, BSN, CCM

## 2014-05-09 NOTE — Progress Notes (Signed)
ANTICOAGULATION CONSULT NOTE - Follow Up Consult  Pharmacy Consult for Heparin  Indication: atrial fibrillation  Allergies  Allergen Reactions  . Codeine Other (See Comments)    Patient Measurements: Height: 5\' 7"  (170.2 cm) Weight: 137 lb 3.2 oz (62.234 kg) IBW/kg (Calculated) : 61.6   Vital Signs: Temp: 98.5 F (36.9 C) (12/27 1948) Temp Source: Oral (12/27 1948) BP: 165/61 mmHg (12/27 1948) Pulse Rate: 80 (12/27 1948)  Labs:  Recent Labs  05/06/14 0535 05/07/14 0533 05/08/14 0456 05/08/14 1603 05/09/14 0250  HGB 15.0 14.0 11.1*  --  10.2*  HCT 44.5 41.6 33.0*  --  30.1*  PLT 193 184 159  --  175  LABPROT 32.4* 19.9* 17.9*  --  18.2*  INR 3.12* 1.67* 1.46  --  1.49  HEPARINUNFRC  --   --   --  0.15* 0.26*  CREATININE 0.76  --  0.59  --  0.56    Estimated Creatinine Clearance: 60 mL/min (by C-G formula based on Cr of 0.56).  Assessment: Sub-therapeutic heparin level despite rate increase, no issues per RN.   Goal of Therapy:  Heparin level 0.3-0.7 units/ml Monitor platelets by anticoagulation protocol: Yes   Plan:  -Increase heparin drip to 1250 units/hr -1200 HL -Daily CBC/HL -Monitor for bleeding  Abran DukeLedford, Sal Spratley 05/09/2014,3:50 AM

## 2014-05-09 NOTE — Discharge Summary (Signed)
Name: [name] SangerCecilia R [name] MRN: [id number] DOB: Dec 04, 1940 73 y.o. PCP: Lorie PhenixNancy Maloney, MD  Date of Admission: 05/05/2014  9:12 PM Date of Discharge: 05/11/2014 Attending Physician: Farley LyJerry Dale Joines, MD  Discharge Diagnosis: 1.  Principal Problem:   Displaced fracture of left femoral neck Active Problems:   Essential hypertension, benign   Atrial fibrillation   MITRAL VALVE REPLACEMENT, HX OF   Dementia   Leukocytosis   Fracture of femoral neck, left  Discharge Medications:   Medication List    TAKE these medications        aspirin EC 81 MG tablet  Take 81 mg by mouth daily.     atorvastatin 20 MG tablet  Commonly known as:  LIPITOR  Take 1 tablet (20 mg total) by mouth daily.     buPROPion 75 MG tablet  Commonly known as:  WELLBUTRIN  Take 75 mg by mouth daily.     EQL ONE DAILY WOMENS PO  Take 1 tablet by mouth daily.     escitalopram 20 MG tablet  Commonly known as:  LEXAPRO  Take 20 mg by mouth daily.     guaiFENesin 100 MG/5ML liquid  Commonly known as:  ROBITUSSIN  Take 200 mg by mouth 3 (three) times daily as needed for cough.     HYDROcodone-acetaminophen 5-325 MG per tablet  Commonly known as:  NORCO  Take 1 tablet by mouth every 6 (six) hours as needed for moderate pain.     lisinopril 10 MG tablet  Commonly known as:  PRINIVIL,ZESTRIL  TAKE 1 TABLET BY MOUTH ONCE A DAY     methocarbamol 500 MG tablet  Commonly known as:  ROBAXIN  Take 1 tablet (500 mg total) by mouth 4 (four) times daily.     metoprolol 50 MG tablet  Commonly known as:  LOPRESSOR  TAKE 1 AND 1/2 TABLET BY MOUTH TWICE DAILY     temazepam 30 MG capsule  Commonly known as:  RESTORIL  Take 30 mg by mouth at bedtime.     Vitamin B 12 250 MCG Lozg  Take 500 mg by mouth daily.     vitamin C 500 MG tablet  Commonly known as:  ASCORBIC ACID  Take 500 mg by mouth daily.     warfarin 5 MG tablet  Commonly known as:  COUMADIN  1 tablet daily except 1/2 tablet on Wednesdays  and saturday or as directed by the Anticoagulation clinic.        Disposition and follow-up:   [name] [name] [facility]  At the hospital follow up visit please address:  1.  Left femoral neck fracture- Pt bridged to coumadin with heparin. Will need repeat PT-INR to ensure therapeutic. Also check patient's progress with physical therapy- how is her mobility? Is family able to handle her PT/OT needs? Patients metoprolol can be titrate up as her BP tolerates.  Wound care- Dry Dressings. Staples will be taken out when she follows up with orthopedics in 2 weeks.  Hip precautions- Weight bearing as tolerated.   2.  Labs / imaging needed at time of follow-up: PT-INR, CBC, bmet  3.  Pending labs/ test needing follow-up: None  Follow-up Appointments: Follow-up Information    Follow up with Eldred MangesYATES,[name] C, MD.   Specialty:  Orthopedic Surgery   Why:  need return office visit 2 weeks postop   Contact information:   [address] [place] [address] [phone]  Follow up with [facility].   Why:  They will contact you to schedule home physical and occupational visits.    Contact information:   [address] [place] [address] [phone]       Discharge Instructions: Discharge Instructions    Diet - low sodium heart healthy    Complete by:  As directed      Increase activity slowly    Complete by:  As directed      Weight bearing as tolerated    Complete by:  As directed            Consultations: Treatment Team:  Eldred Manges, MD  Procedures Performed:  Dg Hip Complete Left  05/05/2014   CLINICAL DATA:  Status post fall. Acute onset of left hip pain and shortening. Initial encounter.  EXAM: LEFT HIP - COMPLETE 2+ VIEW  COMPARISON:  None.  FINDINGS: There is a mildly displaced subcapital fracture through the left femoral neck, with shortening at the fracture site. The  left femoral head remains seated at the acetabulum. No additional fractures are seen. The right hip joint is unremarkable in appearance.  Mild degenerative change is noted at the lower lumbar spine, with mild rotatory scoliosis. The sacroiliac joints are grossly unremarkable. The visualized bowel gas pattern is within normal limits.  IMPRESSION: Mildly displaced subcapital fracture through the left femoral neck, with shortening at the fracture site.   Electronically Signed   By: Roanna Raider M.D.   On: 05/05/2014 22:54   Dg Femur Left  05/05/2014   CLINICAL DATA:  Status post fall. Acute onset of left hip pain and shortening. Initial encounter.  EXAM: LEFT FEMUR - 2 VIEW  COMPARISON:  CT of the abdomen and pelvis performed 04/08/2009  FINDINGS: There is a mildly displaced subcapital fracture through the left femoral neck. No additional fractures are seen. The distal femur appears intact. The knee joint is grossly unremarkable. No knee joint effusion is identified. No significant soft tissue abnormalities are seen.  IMPRESSION: Mildly displaced subcapital fracture through the left femoral neck.   Electronically Signed   By: Roanna Raider M.D.   On: 05/05/2014 22:53   Ct Head Wo Contrast  05/06/2014   CLINICAL DATA:  Fall  EXAM: CT HEAD WITHOUT CONTRAST  TECHNIQUE: Contiguous axial images were obtained from the base of the skull through the vertex without intravenous contrast.  COMPARISON:  04/08/2009  FINDINGS: Mild cortical volume loss noted with proportional ventricular prominence. Areas of periventricular white matter hypodensity are most compatible with small vessel ischemic change. No acute hemorrhage, infarct, or mass lesion is identified. No midline shift. Moderate right maxillary sinusitis. No skull fracture.  IMPRESSION: No acute intracranial finding.  Chronic findings as above.  Moderate right maxillary sinusitis.   Electronically Signed   By: Christiana Pellant M.D.   On: 05/06/2014 01:43    Pelvis Portable  05/07/2014   CLINICAL DATA:  Post close reduction of dislocated right hip prosthesis.  EXAM: PORTABLE PELVIS 1-2 VIEWS  COMPARISON:  05/05/2014  FINDINGS: Patient is status post left hip replacement. Normal AP alignment. No evidence for dislocation. No acute bony abnormality.  IMPRESSION: Left hip replacement.  Normal AP alignment.   Electronically Signed   By: Charlett Nose M.D.   On: 05/07/2014 19:37    Admission HPI: 73 yo female with hx of CAD, MR s/p mech mitral valve, paroxysmal Afib on coumadin, mild carotid stenosis, HLD, HTN, depression, anxiety here with left  hip pain after a fall. Patient stated she had a fall this morning when she was walking around in a hurry and tripped with the end of her bed. She fell and landed on her left side. Husband was present during this fall. Patient is not quite sure if she hit her head. At different times she said she is having pain on her head and then later she denied. She was having hip pain. She has baseline dementia, history is not very clear. Tried to confirm most of the history with her husband. He is also not sure whether she hit her head. She was doing well before the fall without any recent illness or infection. No dizziness or LOC. No other hx of falls. At baseline she is not oriented to date or time and according to husband she is at her baseline mental status. She denies any cough, sob, chest pain, n/v, diarrhea, dysuria, pain anywhere else other than her head or hip. No bleeding from anywhere.  She is compliant with her meds. Husband helps her with meds. Sees Dr. Shirlee LatchMclean cardiology.  Xray done at ED showed mildly displaced subcapital fracture through the left femoral neck. Was given dilaudid for pain control in the ED, not sure how much it helped but seems to be controlling the pain. She appears somewhat uncomfortable during the interview, was diaphoretic, but states she does sweat a lot usually. No other complaints.  Dr. Ophelia CharterYates  ortho was consulted by us and he stated he will take the patient to OR in around 2-3 days for her left femur fracture.  Hospital Course by problem list: Principal Problem:   Displaced fracture of left femoral neck Active Problems:   Essential hypertension, benign   Atrial fibrillation   MITRAL VALVE REPLACEMENT, HX OF   Dementia   Leukocytosis   Fracture of femoral neck, left   Left Femoral Neck Fracture: Mildly displaced subcapital fracture through the left femoral neck, s/p mechanical fall. CT head negative for acute intracranial hemorrhage. Patient had left hip arthroplasty on 05/07/14 and tolerated the surgery well. Her pain was controlled with Dilaudid and Norco as needed. Patient was evaluated by PT/OT and SNF placement recommended. Patient and husband in agreement. Patient was discharged to SNF with follow-up with Dr. Ophelia CharterYates from orthopedics and a rolling walker.   UTI: WBC count 17.1 on admission. UA positive for TNTC WBC, nitrites, leukocytes, and many bacteria. Patient was started on IV Rocephin for 3 days. Urine culture grew pansensitive E.coli. Patient was then transitioned to Keflex 500 mg PO Q12H for 2 days for a total of 7 days of antibiotics which is sufficient for UTI treatment.   Paroxysmal AFib: Patient's coumadin was initially held and she was bridged with IV heparin. She was resumed on her home coumadin on 05/09/14. She takes 5mg  everday except wednesdays and Saturday when she takes 2.5mg . Patient will need follow up PT-INR with her PCP. She was continued on her home Metoprolol for rate control, dose can be uptitrated up as patients BP tyolrates.   HTN: Patient was resumed on her home Lisinopril 10 mg daily and Metoprolol 75 mg BID.   Depression: Patient was resumed on her home Lexapro 20 mg daily.   Anxiety: Patient was resumed on her home Buproprion 75 mg daily.   Insomnia: Patient was resumed on her home Restoril 30 mg QHS.   Discharge Vitals:   BP 112/68 mmHg   Pulse 126  Temp(Src) 98.2 F (36.8 C) (Oral)  Resp 17  Ht 5\' 7"  (1.702 m)  Wt 137 lb 3.2 oz (62.234 kg)  BMI 21.48 kg/m2  SpO2 99% Physical Exam General: alert, resting in bed, NAD HEENT: [place]/AT, EOMI, sclera anicteric, mucus membranes moist CV: mechanical valve murmur Pulm: CTA bilaterally, breaths non-labored Abd: BS+, soft, nontender Ext: left lower extremity in brace, distal pulses 2+ Neuro: alert and oriented to person only, CNs II-XII intact  Discharge Labs:  Results for orders placed or performed during the hospital encounter of 05/05/14 (from the past 24 hour(s))  Protime-INR     Status: Abnormal   Collection Time: 05/11/14  6:00 AM  Result Value Ref Range   Prothrombin Time 24.6 (H) 11.6 - 15.2 seconds   INR 2.19 (H) 0.00 - 1.49    Signed: Onnie Boer, MD 05/11/2014, 2:25 PM   Services Ordered on Discharge: SNF Equipment Ordered on Discharge: Rolling walker

## 2014-05-09 NOTE — Progress Notes (Addendum)
ANTICOAGULATION CONSULT NOTE - Follow Up Consult  Pharmacy Consult for Heparin/Coumadin>>Lovenox/Coumadin Indication: atrial fibrillation  Allergies  Allergen Reactions  . Codeine Other (See Comments)    Patient Measurements: Height: 5\' 7"  (170.2 cm) Weight: 137 lb 3.2 oz (62.234 kg) IBW/kg (Calculated) : 61.6 Heparin Dosing Weight:    Vital Signs: Temp: 98.7 F (37.1 C) (12/28 0611) Temp Source: Oral (12/28 96040611) BP: 132/62 mmHg (12/28 1036) Pulse Rate: 100 (12/28 1036)  Labs:  Recent Labs  05/07/14 0533 05/08/14 0456 05/08/14 1603 05/09/14 0250 05/09/14 1228  HGB 14.0 11.1*  --  10.2*  --   HCT 41.6 33.0*  --  30.1*  --   PLT 184 159  --  175  --   LABPROT 19.9* 17.9*  --  18.2*  --   INR 1.67* 1.46  --  1.49  --   HEPARINUNFRC  --   --  0.15* 0.26* 0.48  CREATININE  --  0.59  --  0.56  --     Estimated Creatinine Clearance: 60 mL/min (by C-G formula based on Cr of 0.56).   Assessment: 73 yo F presents on 12/24 with L hip pain after fall. Larey SeatFell this morning and landed on her left side. Xray showed mildly displaced subcapital fracture through the left femoral neck.   PMH: CAD, AFib, depression, GERD, TIA, HTN, anxiety, HLD  Anticoagulation: Afib. INR on admission was 2.81 (5mg  daily, except 2.5mg  Wed/Sat). Now resumed s/p hip surgery. Heparin level 0.48 now in goal. INR 1.49 today. Dose was NOT charted as given last PM. Plan to transition IV heparin >>LMWH.  Infectious Disease: Afebrile, WBC elevated at 17.1 on admission, now 10.9 UA showed positive nitrite, moderate leukocytes, many bacteria.  12/25 Urine Cx > E. Coli > 100k  12/25 Ceftriaxone >> 12/28 12/28 Keflex>>  Cardiovascular: HTN, Afib, MVR, VSS on atorvastatin, lisinopril, metoprolol (ASA on hold)  Endocrinology: Glucose 116.  Gastrointestinal / Nutrition:  Neurology: Dementia, CT of the head unremarkable, Wellbutrin, Lexapro, Restoril  Nephrology: SCr 0.56, CrCl 1960ml/min. Lytes ok exc Na  132  Pulmonary: RA  Hematology / Oncology: Post-op anemia  PTA Medication Issues:  Best Practices: heparin gtt, Coumadin  Goal of Therapy:  Heparin level 0.3-0.7 units/ml  INR 2-3 Monitor platelets by anticoagulation protocol: Yes   Plan:  Coumadin 7.5mg  po x 1 tonight. D/C IV heparin. Lovenox 60mg  SQ BID   Joniel Graumann S. Merilynn Finlandobertson, PharmD, BCPS Clinical Staff Pharmacist Pager [phone]  Misty Stanleyobertson, Jayesh Marbach Stillinger 05/09/2014,1:54 PM

## 2014-05-09 NOTE — Progress Notes (Signed)
Utilization review completed. Cameo Shewell, RN, BSN. 

## 2014-05-10 ENCOUNTER — Encounter (HOSPITAL_COMMUNITY): Payer: Self-pay | Admitting: Orthopaedic Surgery

## 2014-05-10 LAB — PROTIME-INR
INR: 1.76 — ABNORMAL HIGH (ref 0.00–1.49)
Prothrombin Time: 20.7 seconds — ABNORMAL HIGH (ref 11.6–15.2)

## 2014-05-10 MED ORDER — WARFARIN SODIUM 5 MG PO TABS
5.0000 mg | ORAL_TABLET | Freq: Once | ORAL | Status: AC
Start: 2014-05-10 — End: 2014-05-10
  Administered 2014-05-10: 5 mg via ORAL
  Filled 2014-05-10: qty 1

## 2014-05-10 NOTE — Progress Notes (Signed)
Physical Therapy Treatment Patient Details Name: [name] SangerCecilia R Housley MRN: [id number] DOB: December 17, 1940 Today's Date: 05/10/2014    History of Present Illness [name] SangerCecilia R. Camps is a 73 y.o. female admitted 05/05/14 after a fall at home resulting in left displaced femoral neck fx. Pt s/p L arthroplasty monopolar hip on 05/07/14. PMH: with history of dementia making following Posterior Hip precautions quite challenging    PT Comments    Continuing progress, though activity tolerance today seemingly limited by pain; Excellent participation in therex with good muscle control of hip joint  Continue to recommend SNF for post-acute rehab to maximize independence and safety with mobility prior t dc home   Follow Up Recommendations  SNF;Supervision/Assistance - 24 hour     Equipment Recommendations  Rolling walker with 5" wheels;3in1 (PT)    Recommendations for Other Services       Precautions / Restrictions Precautions Precautions: Posterior Hip Precaution Comments: reinforced post hip prec with husband with verbal and demonstrational cues Required Braces or Orthoses: Knee Immobilizer - Left Knee Immobilizer - Left:  (advising that pt wear at all times to increase adherence to Post Hip Prec ) Restrictions LLE Weight Bearing: Weight bearing as tolerated    Mobility  Bed Mobility Overal bed mobility: Needs Assistance Bed Mobility: Supine to Sit     Supine to sit: Min guard;HOB elevated     General bed mobility comments: VC's for sequencing, however pt able to complete with S and Use of bed rails.   Transfers Overall transfer level: Needs assistance Equipment used: Rolling walker (2 wheeled) Transfers: Sit to/from Stand Sit to Stand: Min guard         General transfer comment: Cues for hand placement and LLE placement for transfers; not needing  physical assist, but extremely close guard and step-by-step cues to control descent with stand to  sit  Ambulation/Gait Ambulation/Gait assistance: Min guard Ambulation Distance (Feet): 40 Feet Assistive device: Rolling walker (2 wheeled) Gait Pattern/deviations: Step-through pattern Gait velocity: slowed Gait velocity interpretation: <1.8 ft/sec, indicative of risk for recurrent falls General Gait Details: Cues for hip precautions with turns, and to "make sure your toes are pointing where you're going"   Stairs            Wheelchair Mobility    Modified Rankin (Stroke Patients Only)       Balance                                    Cognition Arousal/Alertness: Awake/alert Behavior During Therapy: Flat affect (though smiling a bit more) Overall Cognitive Status: History of cognitive impairments - at baseline                      Exercises Total Joint Exercises Quad Sets: AROM;Left;10 reps Gluteal Sets: AROM;Both;10 reps Towel Squeeze: AROM;Both;10 reps Short Arc Quad: AROM;Left;10 reps Heel Slides: AAROM;AROM;Left;10 reps Hip ABduction/ADduction: AROM;Left;10 reps    General Comments        Pertinent Vitals/Pain Pain Assessment: Faces Faces Pain Scale: Hurts even more Pain Location: L hip, especially with WBing Pain Descriptors / Indicators: Aching Pain Intervention(s): Monitored during session;Repositioned    Home Living                      Prior Function            PT Goals (current goals can now be found in  the care plan section) Acute Rehab PT Goals Patient Stated Goal: did not state PT Goal Formulation: With patient/family Time For Goal Achievement: 05/22/14 Potential to Achieve Goals: Good Progress towards PT goals: Progressing toward goals    Frequency  Min 5X/week    PT Plan Current plan remains appropriate    Co-evaluation             End of Session Equipment Utilized During Treatment: Gait belt Activity Tolerance: Patient tolerated treatment well Patient left: in chair;with call  bell/phone within reach;with chair alarm set     Time: 1215-1242 PT Time Calculation (min) (ACUTE ONLY): 27 min  Charges:  $Gait Training: 8-22 mins $Therapeutic Exercise: 8-22 mins                    G Codes:      Olen PelGarrigan, Mihail Prettyman Hamff 05/10/2014, 1:45 PM  Van ClinesHolly Kassiah Mccrory, [place]PT  Acute Rehabilitation Services Pager [phone] Office [phone]

## 2014-05-10 NOTE — Progress Notes (Signed)
ANTICOAGULATION CONSULT NOTE - Follow Up Consult  Pharmacy Consult for Lovenox/Coumadin Indication: atrial fibrillation  Allergies  Allergen Reactions  . Codeine Other (See Comments)    Patient Measurements: Height: 5\' 7"  (170.2 cm) Weight: 137 lb 3.2 oz (62.234 kg) IBW/kg (Calculated) : 61.6 Heparin Dosing Weight:    Vital Signs: Temp: 97.6 F (36.4 C) (12/29 0600) BP: 124/72 mmHg (12/29 0600) Pulse Rate: 118 (12/29 0600)  Labs:  Recent Labs  05/08/14 0456 05/08/14 1603 05/09/14 0250 05/09/14 1228 05/10/14 0500  HGB 11.1*  --  10.2*  --   --   HCT 33.0*  --  30.1*  --   --   PLT 159  --  175  --   --   LABPROT 17.9*  --  18.2*  --  20.7*  INR 1.46  --  1.49  --  1.76*  HEPARINUNFRC  --  0.15* 0.26* 0.48  --   CREATININE 0.59  --  0.56  --   --     Estimated Creatinine Clearance: 60 mL/min (by C-G formula based on Cr of 0.56).   Assessment: 73 yo F presents on 12/24 with L hip pain after fall on her left side. Xray showed mildly displaced subcapital fracture through the left femoral neck.   PMH: CAD, AFib, depression, GERD, TIA, HTN, anxiety, HLD  Anticoagulation: Afib. INR on admission was 2.81 (5mg  daily, except 2.5mg  Wed/Sat). Now resumed s/p hip surgery. INR 1.76 today. Dose was NOT charted as given 12/27. Plan to transition IV heparin >>LMWH.  Infectious Disease: Afebrile, WBC elevated at 17.1 on admission, now 10.9 UA showed positive nitrite, moderate leukocytes, many bacteria. Could stop in 2 more days.  12/25 Urine Cx > E. Coli > 100k  12/25 Ceftriaxone >> 12/28 12/28 Keflex>>  Cardiovascular: HTN, Afib, MVR, VSS (HR 118) on atorvastatin, lisinopril, metoprolol (ASA on hold)  Endocrinology: Glucose 116.  Neurology: Dementia, CT of the head unremarkable, Wellbutrin, Lexapro, Restoril  Nephrology: SCr 0.56, CrCl 7860ml/min. Lytes ok exc Na 132, K=3.3  Pulmonary: RA  Hematology / Oncology: Post-op anemia  PTA Medication Issues:  Best  Practices: heparin gtt, Coumadin  Goal of Therapy:    INR 2-3 Monitor platelets by anticoagulation protocol: Yes   Plan:  Coumadin 5mg  po x 1 tonight. Lovenox 60mg  SQ BID [facility]   Jailyne Chieffo S. Merilynn Finlandobertson, PharmD, BCPS Clinical Staff Pharmacist Pager [phone]  Misty Stanleyobertson, Shantana Christon Stillinger 05/10/2014,10:30 AM

## 2014-05-10 NOTE — Progress Notes (Signed)
Subjective: 3 Days Post-Op Procedure(s) (LRB): ARTHROPLASTY MONOPOLAR HIP (Left) Patient reports pain as mild.    Objective: Vital signs in last 24 hours: Temp:  [97.6 F (36.4 C)-98.7 F (37.1 C)] 97.6 F (36.4 C) (12/29 0600) Pulse Rate:  [100-122] 118 (12/29 0600) Resp:  [16-18] 18 (12/29 0600) BP: (124-137)/(62-80) 124/72 mmHg (12/29 0600) SpO2:  [95 %-100 %] 95 % (12/29 0600)  Intake/Output from previous day: 12/28 0701 - 12/29 0700 In: 180 [P.O.:180] Out: 450 [Urine:450] Intake/Output this shift:     Recent Labs  05/08/14 0456 05/09/14 0250  HGB 11.1* 10.2*    Recent Labs  05/08/14 0456 05/09/14 0250  WBC 10.2 10.9*  RBC 3.64* 3.47*  HCT 33.0* 30.1*  PLT 159 175    Recent Labs  05/08/14 0456 05/09/14 0250  NA 132* 132*  K 4.1 3.3*  CL 101 97  CO2 27 28  BUN 9 7  CREATININE 0.59 0.56  GLUCOSE 136* 116*  CALCIUM 8.3* 8.3*    Recent Labs  05/09/14 0250 05/10/14 0500  INR 1.49 1.76*    Neurologically intact  Assessment/Plan: 3 Days Post-Op Procedure(s) (LRB): ARTHROPLASTY MONOPOLAR HIP (Left) Up with therapy  YATES,MARK C 05/10/2014, 7:40 AM

## 2014-05-10 NOTE — Discharge Instructions (Signed)
Information on my medicine - Coumadin®   (Warfarin) ° °This medication education was reviewed with me or my healthcare representative as part of my discharge preparation.  The pharmacist that spoke with me during my hospital stay was:  Oza Oberle Stillinger, RPH ° °Why was Coumadin prescribed for you? °Coumadin was prescribed for you because you have a blood clot or a medical condition that can cause an increased risk of forming blood clots. Blood clots can cause serious health problems by blocking the flow of blood to the heart, lung, or brain. Coumadin can prevent harmful blood clots from forming. °As a reminder your indication for Coumadin is:   Stroke Prevention Because Of Atrial Fibrillation ° °What test will check on my response to Coumadin? °While on Coumadin (warfarin) you will need to have an INR test regularly to ensure that your dose is keeping you in the desired range. The INR (international normalized ratio) number is calculated from the result of the laboratory test called prothrombin time (PT). ° °If an INR APPOINTMENT HAS NOT ALREADY BEEN MADE FOR YOU please schedule an appointment to have this lab work done by your health care provider within 7 days. °Your INR goal is usually a number between:  2 to 3 or your provider may give you a more narrow range like 2-2.5.  Ask your health care provider during an office visit what your goal INR is. ° °What  do you need to  know  About  COUMADIN? °Take Coumadin (warfarin) exactly as prescribed by your healthcare provider about the same time each day.  DO NOT stop taking without talking to the doctor who prescribed the medication.  Stopping without other blood clot prevention medication to take the place of Coumadin may increase your risk of developing a new clot or stroke.  Get refills before you run out. ° °What do you do if you miss a dose? °If you miss a dose, take it as soon as you remember on the same day then continue your regularly scheduled  regimen the next day.  Do not take two doses of Coumadin at the same time. ° °Important Safety Information °A possible side effect of Coumadin (Warfarin) is an increased risk of bleeding. You should call your healthcare provider right away if you experience any of the following: °  Bleeding from an injury or your nose that does not stop. °  Unusual colored urine (red or dark brown) or unusual colored stools (red or black). °  Unusual bruising for unknown reasons. °  A serious fall or if you hit your head (even if there is no bleeding). ° °Some foods or medicines interact with Coumadin® (warfarin) and might alter your response to warfarin. To help avoid this: °  Eat a balanced diet, maintaining a consistent amount of Vitamin K. °  Notify your provider about major diet changes you plan to make. °  Avoid alcohol or limit your intake to 1 drink for women and 2 drinks for men per day. °(1 drink is 5 oz. wine, 12 oz. beer, or 1.5 oz. liquor.) ° °Make sure that ANY health care provider who prescribes medication for you knows that you are taking Coumadin (warfarin).  Also make sure the healthcare provider who is monitoring your Coumadin knows when you have started a new medication including herbals and non-prescription products. ° °Coumadin® (Warfarin)  Major Drug Interactions  °Increased Warfarin Effect Decreased Warfarin Effect  °Alcohol (large quantities) °Antibiotics (esp. Septra/Bactrim, Flagyl, Cipro) °Amiodarone (Cordarone) °Aspirin (  ASA) °Cimetidine (Tagamet) °Megestrol (Megace) °NSAIDs (ibuprofen, naproxen, etc.) °Piroxicam (Feldene) °Propafenone (Rythmol SR) °Propranolol (Inderal) °Isoniazid (INH) °Posaconazole (Noxafil) Barbiturates (Phenobarbital) °Carbamazepine (Tegretol) °Chlordiazepoxide (Librium) °Cholestyramine (Questran) °Griseofulvin °Oral Contraceptives °Rifampin °Sucralfate (Carafate) °Vitamin K  ° °Coumadin® (Warfarin) Major Herbal Interactions  °Increased Warfarin Effect Decreased Warfarin Effect    °Garlic °Ginseng °Ginkgo biloba Coenzyme Q10 °Green tea °St. John’s wort   ° °Coumadin® (Warfarin) FOOD Interactions  °Eat a consistent number of servings per week of foods HIGH in Vitamin K °(1 serving = ½ cup)  °Collards (cooked, or boiled & drained) °Kale (cooked, or boiled & drained) °Mustard greens (cooked, or boiled & drained) °Parsley *serving size only = ¼ cup °Spinach (cooked, or boiled & drained) °Swiss chard (cooked, or boiled & drained) °Turnip greens (cooked, or boiled & drained)  °Eat a consistent number of servings per week of foods MEDIUM-HIGH in Vitamin K °(1 serving = 1 cup)  °Asparagus (cooked, or boiled & drained) °Broccoli (cooked, boiled & drained, or raw & chopped) °Brussel sprouts (cooked, or boiled & drained) *serving size only = ½ cup °Lettuce, raw (green leaf, endive, romaine) °Spinach, raw °Turnip greens, raw & chopped  ° °These websites have more information on Coumadin (warfarin):  www.coumadin.com; °www.ahrq.gov/consumer/coumadin.htm; ° ° ° °

## 2014-05-10 NOTE — Clinical Social Work Psychosocial (Signed)
Clinical Social Work Department BRIEF PSYCHOSOCIAL ASSESSMENT 05/10/2014  Patient:  [name] [name],[name] [name]     Account Number:  [id number]     Admit date:  05/05/2014  Clinical Social Worker:  Read DriversINGLE,Delesia Martinek, LCSWA  Date/Time:  05/10/2014 01:07 PM  Referred by:  Physician  Date Referred:  05/10/2014 Referred for  SNF Placement  Psychosocial assessment   Other Referral:   none   Interview type:  Other - See comment Other interview type:   patient and patient's husband, [name] [name]    PSYCHOSOCIAL DATA Living Status:  HUSBAND Admitted from facility:   Level of care:   Primary support name:  [name] [name] Primary support relationship to patient:  SPOUSE Degree of support available:   adequate    CURRENT CONCERNS Current Concerns  Post-Acute Placement   Other Concerns:   none    SOCIAL WORK ASSESSMENT / PLAN CSW assessed pt at bedside.  Patient is baseline confused- only oriented to self and place (intermittently).  [name] [name], patient's husband, reports being unaware of SNF facilities and requests CSW to [place] [facility] [place] [facility] [place]. Patient husband voiced frustration and guilt not being able to care for wife at home as planned.  CSW offered support.   Assessment/plan status:  Psychosocial Support/Ongoing Assessment of Needs Other assessment/ plan:   FL2  PASARR   Information/referral to community resources:   SNF/STR  [employer] authorization needed    PATIENT'S/FAMILY'S RESPONSE TO PLAN OF CARE: Patient and husband are both agreeable to [place].       Vickii PennaGina Jahlil Ziller, LCSWA [phone]  Psychiatric & Orthopedics (5N 1-16) Clinical Social Worker

## 2014-05-10 NOTE — Progress Notes (Signed)
Subjective: Patient alert and oriented to person and month. She denies any pain, has been doing well working with PT.   Discussed option of SNF with patient, her husband, and PT and all are in agreement that this would be the best option for her.   Objective: Vital signs in last 24 hours: Filed Vitals:   05/09/14 2200 05/10/14 0000 05/10/14 0400 05/10/14 0600  BP: 128/64   124/72  Pulse: 122   118  Temp: 98.7 F (37.1 C)   97.6 F (36.4 C)  TempSrc:      Resp: 18 18 18 18   Height:      Weight:      SpO2: 97% 97% 97% 95%   Weight change:   Intake/Output Summary (Last 24 hours) at 05/10/14 1114 Last data filed at 05/10/14 0830  Gross per 24 hour  Intake    180 ml  Output    250 ml  Net    -70 ml   Physical Exam General: alert, sitting up in bed, pleasant  HEENT: [place]/AT, EOMI, mucus membranes moist CV: RRR, 2/6 mechanical valve murmur Pulm: CTA bilaterally, breaths non-labored Abd: BS+, soft, non-tender Ext: warm, no edema, distal pulses 2+, brace off of left leg Neuro: alert and oriented to person and month, CNs II-XII intact  Lab Results: Basic Metabolic Panel:  Recent Labs Lab 05/08/14 0456 05/09/14 0250  NA 132* 132*  K 4.1 3.3*  CL 101 97  CO2 27 28  GLUCOSE 136* 116*  BUN 9 7  CREATININE 0.59 0.56  CALCIUM 8.3* 8.3*   CBC:  Recent Labs Lab 05/05/14 2142  05/07/14 0533 05/08/14 0456 05/09/14 0250  WBC 17.1*  < > 15.5* 10.2 10.9*  NEUTROABS 14.6*  --  13.1*  --   --   HGB 15.4*  < > 14.0 11.1* 10.2*  HCT 44.7  < > 41.6 33.0* 30.1*  MCV 88.3  < > 89.1 90.7 86.7  PLT 215  < > 184 159 175  < > = values in this interval not displayed. Coagulation:  Recent Labs Lab 05/07/14 0533 05/08/14 0456 05/09/14 0250 05/10/14 0500  LABPROT 19.9* 17.9* 18.2* 20.7*  INR 1.67* 1.46 1.49 1.76*   Urinalysis:  Recent Labs Lab 05/06/14 0231  COLORURINE YELLOW  LABSPEC 1.020  PHURINE 6.0  GLUCOSEU NEGATIVE  HGBUR SMALL*  BILIRUBINUR NEGATIVE    KETONESUR 15*  PROTEINUR NEGATIVE  UROBILINOGEN 0.2  NITRITE POSITIVE*  LEUKOCYTESUR MODERATE*    Micro Results: Recent Results (from the past 240 hour(s))  Culture, Urine     Status: None   Collection Time: 05/06/14  2:31 AM  Result Value Ref Range Status   Specimen Description URINE, CATHETERIZED  Final   Special Requests NONE  Final   Colony Count   Final    >=100,000 COLONIES/ML Performed at [facility]    Culture   Final    ESCHERICHIA COLI Performed at [facility]    Report Status 05/08/2014 FINAL  Final   Organism ID, Bacteria ESCHERICHIA COLI  Final      Susceptibility   Escherichia coli - MIC*    AMPICILLIN <=2 SENSITIVE Sensitive     CEFAZOLIN <=4 SENSITIVE Sensitive     CEFTRIAXONE <=1 SENSITIVE Sensitive     CIPROFLOXACIN <=0.25 SENSITIVE Sensitive     GENTAMICIN <=1 SENSITIVE Sensitive     LEVOFLOXACIN <=0.12 SENSITIVE Sensitive     NITROFURANTOIN <=16 SENSITIVE Sensitive     TOBRAMYCIN <=1 SENSITIVE Sensitive  TRIMETH/SULFA <=20 SENSITIVE Sensitive     PIP/TAZO <=4 SENSITIVE Sensitive     * ESCHERICHIA COLI  Surgical pcr screen     Status: None   Collection Time: 05/07/14 12:33 PM  Result Value Ref Range Status   MRSA, PCR NEGATIVE NEGATIVE Final   Staphylococcus aureus NEGATIVE NEGATIVE Final    Comment:        The Xpert SA Assay (FDA approved for NASAL specimens in patients over 621 years of age), is one component of a comprehensive surveillance program.  Test performance has been validated by [facility] for patients greater than or equal to 74 year old. It is not intended to diagnose infection nor to guide or monitor treatment.    Studies/Results: No results found. Medications: I have reviewed the patient's current medications. Scheduled Meds: . atorvastatin  20 mg Oral Daily  . buPROPion  75 mg Oral Daily  . cephALEXin  500 mg Oral Q12H  . cyanocobalamin  500 mcg Oral Daily  . docusate sodium  100 mg Oral BID   . enoxaparin (LOVENOX) injection  60 mg Subcutaneous Q12H  . escitalopram  20 mg Oral Daily  . lisinopril  10 mg Oral Daily  . metoprolol  75 mg Oral BID  . temazepam  30 mg Oral QHS  . warfarin  5 mg Oral ONCE-1800  . Warfarin - Pharmacist Dosing Inpatient   Does not apply q1800   Continuous Infusions: . dextrose 5 % and 0.45% NaCl 100 mL/hr at 05/08/14 0207   PRN Meds:.acetaminophen **OR** acetaminophen, HYDROcodone-acetaminophen, HYDROmorphone (DILAUDID) injection, LORazepam, menthol-cetylpyridinium **OR** phenol, methocarbamol, ondansetron **OR** ondansetron (ZOFRAN) IV Assessment/Plan: Principal Problem:   Displaced fracture of left femoral neck Active Problems:   Essential hypertension, benign   Atrial fibrillation   MITRAL VALVE REPLACEMENT, HX OF   Dementia   Leukocytosis   Fracture of femoral neck, left  Left Femoral neck fracture- postop day 3 s/p left hip monopolar arthroplasty w/ Dr. Ophelia CharterYates - Pain management w/ dialudid 0.5mg  q3hr PRN and norco 5-325mg  q6h prn, pain controlled  - Switched to Lovenox yesterday. Continue Lovenox and coumadin per pharmacy - PT/OT consulted--> now recommending SNF. Agree this is the best option after discussing with PT and patient's husband. Social work consulted.   UTI-- asymptomatic. UA positive for TNTC WBC, nitrites, LE, and many bacteria. Likely cause of leukocytosis.  - Urine culture growing E.coli, pansensitive - Continue Keflex 500 mg Q12H for a total of 7 days (start date: 05/09/14)  Paroxysmal Afib - on coumadin at home.  - Continue lovenox and coumadin per pharmacy, INR 1.76 today - Continue Metoprolol 75 mg BID  HTN - At home takes lisinopril 10mg , metoprolol 75mg  BID - Continue home meds and monitor  Depression  - Continue Lexapro 20 mg daily  Anxiety  - Continue Bupropion 75 mg daily  Insomnia  - Continue Restoril 30 mg QHS  Diet: Heart healthy VTE PPx: Heparin and Coumadin  Dispo: Hopefully SNF placement  today or tomorrow   The patient does have a current PCP Lorie Phenix(Nancy Maloney, MD) and [facility] discharge.  The patient does not have transportation limitations that hinder transportation to clinic appointments.  .Services Needed at time of discharge: Y = Yes, Blank = No PT:   OT:   RN:   Equipment:   Other:     LOS: 5 days   Rich Numberarly Rivet, MD 05/10/2014, 11:14 AM

## 2014-05-10 NOTE — Clinical Social Work Placement (Addendum)
Clinical Social Work Department CLINICAL SOCIAL WORK PLACEMENT NOTE 05/10/2014  Patient:  [name] [name],[name] [name]  Account Number:  [id number] Admit date:  05/05/2014  Clinical Social Worker:  Read DriversEGINA Caren Garske, LCSWA  Date/time:  05/10/2014 01:10 PM  Clinical Social Work is seeking post-discharge placement for this patient at the following level of care:   SKILLED NURSING   (*CSW will update this form in Epic as items are completed)   05/10/2014  Patient/family provided with [facility] Department of Clinical Social Work's list of facilities offering this level of care within the geographic area requested by the patient (or if unable, by the patient's family).  05/10/2014  Patient/family informed of their freedom to choose among providers that offer the needed level of care, that participate in Medicare, Medicaid or managed care program needed by the patient, have an available bed and are willing to accept the patient.  05/10/2014  Patient/family informed of [facility]' ownership interest in [facility], as well as of the fact that they are under no obligation to receive care at this facility.  PASARR submitted to EDS on  PASARR number received on   FL2 transmitted to all facilities in geographic area requested by pt/family on  05/10/2014 FL2 transmitted to all facilities within larger geographic area on   Patient informed that his/her managed care company has contracts with or will negotiate with  certain facilities, including the following:     Patient/family informed of bed offers received:  05/11/2014 [facility] Physician recommends and patient chooses bed at    [facility]  05/11/2014 Patient to be transferred to facility by [facility] Patient and family notified of transfer on 05/11/2014 Name of family member notified:  John husband and patient RN also updated  The following physician request were entered in  Epic:   Additional Comments:   Vickii PennaGina Manpreet Strey, LCSWA [phone]  Psychiatric & Orthopedics (5N 1-16) Clinical Social Worker

## 2014-05-11 LAB — PROTIME-INR
INR: 2.19 — ABNORMAL HIGH (ref 0.00–1.49)
PROTHROMBIN TIME: 24.6 s — AB (ref 11.6–15.2)

## 2014-05-11 MED ORDER — WARFARIN SODIUM 5 MG PO TABS: ORAL_TABLET | ORAL | Status: DC

## 2014-05-11 MED ORDER — WARFARIN SODIUM 5 MG PO TABS: 5.0000 mg | ORAL_TABLET | ORAL | Status: DC

## 2014-05-11 MED ORDER — WARFARIN SODIUM 2.5 MG PO TABS
2.5000 mg | ORAL_TABLET | ORAL | Status: DC
Start: 2014-05-11 — End: 2014-05-11
  Filled 2014-05-11: qty 1

## 2014-05-11 NOTE — Progress Notes (Signed)
Subjective: Doing well.  Pain controlled.     Objective: Vital signs in last 24 hours: Temp:  [97.7 F (36.5 C)-98.2 F (36.8 C)] 98.2 F (36.8 C) (12/30 0609) Pulse Rate:  [120-126] 126 (12/30 0609) Resp:  [16-20] 17 (12/30 0609) BP: (112-126)/(61-98) 112/68 mmHg (12/30 0609) SpO2:  [99 %-100 %] 99 % (12/30 0609)  Intake/Output from previous day: 12/29 0701 - 12/30 0700 In: 420 [P.O.:420] Out: -  Intake/Output this shift:     Recent Labs  05/09/14 0250  HGB 10.2*    Recent Labs  05/09/14 0250  WBC 10.9*  RBC 3.47*  HCT 30.1*  PLT 175    Recent Labs  05/09/14 0250  NA 132*  K 3.3*  CL 97  CO2 28  BUN 7  CREATININE 0.56  GLUCOSE 116*  CALCIUM 8.3*    Recent Labs  05/10/14 0500 05/11/14 0600  INR 1.76* 2.19*    Exam:  Some bloody drainage on dressing.  Staples intact.  No signs of infection.  Some bruising lateral hip.  Calf nontender, NVI.    Assessment/Plan: Awaiting transfer to SNF.  Stable from ortho standpoint.    Gresham Caetano M 05/11/2014, 8:26 AM

## 2014-05-11 NOTE — Progress Notes (Signed)
Physical Therapy Treatment Patient Details Name: [name] [name] MRN: [id number] DOB: 1941/02/13 Today's Date: 05/11/2014    History of Present Illness [name] SangerCecilia R. Hara is a 73 y.o. female admitted 05/05/14 after a fall at home resulting in left displaced femoral neck fx. Pt s/p L arthroplasty monopolar hip on 05/07/14. PMH: dementia    PT Comments    Participates well despite more pain apparent today; Cued pt to push down into RW to unweigh painful LLE; Noted UE fatigue after amb this session, and pt did report R shoulder discomfort during amb; AROM R shoulder WNL for elevation, flexion, abduction, and pt is without tenderness to palpation -- I posit that her shoulder discomfort is from heavy pushing down into RW during amb   Encouraged pt's husband to be present during rehab sessions at SNF as she will be dependent on him once home to remember and follow Posterior Hip Prec once home   Follow Up Recommendations  SNF;Supervision/Assistance - 24 hour     Equipment Recommendations  Rolling walker with 5" wheels;3in1 (PT)    Recommendations for Other Services       Precautions / Restrictions Precautions Precautions: Posterior Hip Precaution Comments: reinforced post hip prec with husband with verbal and demonstrational cues Required Braces or Orthoses: Knee Immobilizer - Left Knee Immobilizer - Left:  (advising that pt wear at all times to increase adherence to Post hip prec ) Restrictions LLE Weight Bearing: Weight bearing as tolerated    Mobility  Bed Mobility Overal bed mobility: Needs Assistance Bed Mobility: Supine to Sit     Supine to sit: Min assist     General bed mobility comments: VC's for sequencing, however pt able to complete with S and Use of bed rails.   Transfers Overall transfer level: Needs assistance Equipment used: Rolling walker (2 wheeled) Transfers: Sit to/from Stand Sit to Stand: Min guard         General transfer comment: Cues for hand  placement and LLE placement for transfers; not needing  physical assist, but extremely close guard and step-by-step cues to control descent with stand to sit  Ambulation/Gait Ambulation/Gait assistance: Min guard Ambulation Distance (Feet): 70 Feet Assistive device: Rolling walker (2 wheeled) Gait Pattern/deviations: Step-to pattern Gait velocity: slowed   General Gait Details: Cues for hip precautions with turns, and to "make sure your toes are pointing where you're going"   Stairs            Wheelchair Mobility    Modified Rankin (Stroke Patients Only)       Balance                                    Cognition Arousal/Alertness: Awake/alert Behavior During Therapy: Flat affect (though smiling a bit more) Overall Cognitive Status: History of cognitive impairments - at baseline       Memory: Decreased short-term memory;Decreased recall of precautions              Exercises      General Comments        Pertinent Vitals/Pain Pain Assessment: Faces Faces Pain Scale: Hurts whole lot (and pt requesting to sit down) Pain Location: L hip Pain Descriptors / Indicators: Grimacing (Requesting to sit) Pain Intervention(s): Limited activity within patient's tolerance;Monitored during session;Repositioned;Premedicated before session    Home Living  Prior Function            PT Goals (current goals can now be found in the care plan section) Acute Rehab PT Goals Patient Stated Goal: did not state PT Goal Formulation: With patient/family Time For Goal Achievement: 05/22/14 Potential to Achieve Goals: Good Progress towards PT goals: Progressing toward goals    Frequency  Min 5X/week    PT Plan Current plan remains appropriate    Co-evaluation             End of Session Equipment Utilized During Treatment: Gait belt Activity Tolerance: Patient tolerated treatment well;Patient limited by pain Patient left:  in chair;with call bell/phone within reach;with chair alarm set     Time: 1151-1210 PT Time Calculation (min) (ACUTE ONLY): 19 min  Charges:  $Gait Training: 8-22 mins                    G Codes:      Van ClinesGarrigan, Linette Gunderson Hamff 05/11/2014, 1:19 PM  Van ClinesHolly Elliotte Marsalis, [place]PT  Acute Rehabilitation Services Pager [phone] Office [phone]

## 2014-05-11 NOTE — Clinical Social Work Note (Addendum)
11:52am-  Patient will discharge today, per MD, to [facility], SNF RN to call report prior to transportation to: [phone] Transportation: [facility]  CSW updated family (husband John), patient and Therapist, sports.  10:24am- CSW met with patient and husband at bedside to review bed offers.  Patient and husband choose [facility] SNF.  [facility] was able to offer a bed and has initiated insurance authorization.   DC pending: insurance authorization and completion of SNF admission paperwork- husband to complete at 1:30pm today  Nonnie Done, [place] [phone]  Psychiatric & Orthopedics (5N 1-16) Clinical Social Worker

## 2014-05-11 NOTE — Progress Notes (Signed)
ANTICOAGULATION CONSULT NOTE - Follow Up Consult  Pharmacy Consult for Coumadin Indication: atrial fibrillation, s/p THA  Allergies  Allergen Reactions  . Codeine Other (See Comments)    Patient Measurements: Height: 5\' 7"  (170.2 cm) Weight: 137 lb 3.2 oz (62.234 kg) IBW/kg (Calculated) : 61.6 Heparin Dosing Weight:    Vital Signs: Temp: 98.2 F (36.8 C) (12/30 0609) Temp Source: Oral (12/30 0609) BP: 112/68 mmHg (12/30 0609) Pulse Rate: 126 (12/30 0609)  Labs:  Recent Labs  05/08/14 1603 05/09/14 0250 05/09/14 1228 05/10/14 0500 05/11/14 0600  HGB  --  10.2*  --   --   --   HCT  --  30.1*  --   --   --   PLT  --  175  --   --   --   LABPROT  --  18.2*  --  20.7* 24.6*  INR  --  1.49  --  1.76* 2.19*  HEPARINUNFRC 0.15* 0.26* 0.48  --   --   CREATININE  --  0.56  --   --   --     Estimated Creatinine Clearance: 60 mL/min (by C-G formula based on Cr of 0.56).   Assessment: 73 yo F presents on 12/24 with L hip pain after fall on her left side. Xray showed mildly displaced subcapital fracture through the left femoral neck.   PMH: CAD, AFib, depression, GERD, TIA, HTN, anxiety, HLD  Anticoagulation: Afib. INR on admission was 2.81 (5mg  daily, except 2.5mg  Wed/Sat). Now resumed s/p hip surgery. INR 2.19 today. Dose was NOT charted as given 12/27.   Infectious Disease: Afebrile, WBC elevated at 17.1 on admission,>> now 10.9. UA showed positive nitrite, moderate leukocytes, many bacteria. Could stop Keflex in 1 more day  12/25 Urine Cx > E. Coli > 100k  12/25 Ceftriaxone >> 12/28 12/28 Keflex>>  Cardiovascular: HTN, Afib, MVR, VSS (HR 118) on atorvastatin, lisinopril, metoprolol (ASA on hold)  Endocrinology: Glucose 116.  Neurology: Dementia, depression/anxiety, CT of the head unremarkable, Wellbutrin, Lexapro, Restoril  Nephrology: SCr 0.56, CrCl 6160ml/min. Lytes ok exc Na 132, K=3.3  Hematology / Oncology: Post-op anemia  Goal of Therapy:    INR  2-3 Monitor platelets by anticoagulation protocol: Yes   Plan:  Resume home Coumadin regimen of 5mg  daily except 2.5mg  Wed/Sat D/c'd Lovenox Plan rehab at SNF **Please consider STOPPING Keflex 12/31 (s/p 7d abx Rocephin>>Keflex)   [name] [name] S. Merilynn Finlandobertson, PharmD, [facility] Clinical Staff Pharmacist Pager [phone]  Misty Stanleyobertson, Kristi Hyer Stillinger 05/11/2014,8:48 AM

## 2014-05-11 NOTE — Progress Notes (Addendum)
Discharge instructions gave to patient and her husband. Dressing changed and patient is ready for SNF. Report Gave to RN, Sant[facility] EvansLuvecah Julius [facility].

## 2014-05-11 NOTE — Progress Notes (Signed)
Internal Medicine Attending  Date: 05/11/2014  Patient name: [name] SangerCecilia R Ringley Medical record number: [id number] Date of birth: 06-Jun-1940 Age: 73 y.o. Gender: female  I saw and evaluated the patient. I discussed patient and reviewed the resident's note by Dr. Beckie Saltsivet, and I agree with the resident's findings and plans as documented in her note, with the following additional comments.  Patient is doing well without acute complaints, and is stable for discharge to skilled nursing facility.  She has had mild asymptomatic tachycardia postoperatively; at the time of my exam her apical rate was 100.  She may benefit from an increase in her beta blocker depending upon her blood pressure if this persists.  We discussed discharge plans with patient and her husband.

## 2014-05-11 NOTE — Progress Notes (Signed)
Subjective: Patient denies any pain. Awaiting SNF placement.   Objective: Vital signs in last 24 hours: Filed Vitals:   05/10/14 0600 05/10/14 1546 05/10/14 2107 05/11/14 0609  BP: 124/72 119/61 126/98 112/68  Pulse: 118 120 125 126  Temp: 97.6 F (36.4 C) 97.7 F (36.5 C) 98.2 F (36.8 C) 98.2 F (36.8 C)  TempSrc:  Oral Oral Oral  Resp: 18 16 20 17   Height:      Weight:      SpO2: 95% 99% 100% 99%   Weight change:   Intake/Output Summary (Last 24 hours) at 05/11/14 0947 Last data filed at 05/10/14 2107  Gross per 24 hour  Intake    300 ml  Output      0 ml  Net    300 ml   Physical Exam General: alert, sitting up in bed, pleasant  HEENT: [place]/AT, EOMI, mucus membranes moist CV: RRR, 2/6 mechanical valve murmur Pulm: CTA bilaterally, breaths non-labored Abd: BS+, soft, non-tender Ext: warm, no edema, distal pulses 2+ Neuro: alert and oriented to person and month, CNs II-XII intact  Lab Results: Basic Metabolic Panel:  Recent Labs Lab 05/08/14 0456 05/09/14 0250  NA 132* 132*  K 4.1 3.3*  CL 101 97  CO2 27 28  GLUCOSE 136* 116*  BUN 9 7  CREATININE 0.59 0.56  CALCIUM 8.3* 8.3*   CBC:  Recent Labs Lab 05/05/14 2142  05/07/14 0533 05/08/14 0456 05/09/14 0250  WBC 17.1*  < > 15.5* 10.2 10.9*  NEUTROABS 14.6*  --  13.1*  --   --   HGB 15.4*  < > 14.0 11.1* 10.2*  HCT 44.7  < > 41.6 33.0* 30.1*  MCV 88.3  < > 89.1 90.7 86.7  PLT 215  < > 184 159 175  < > = values in this interval not displayed.  Coagulation:  Recent Labs Lab 05/08/14 0456 05/09/14 0250 05/10/14 0500 05/11/14 0600  LABPROT 17.9* 18.2* 20.7* 24.6*  INR 1.46 1.49 1.76* 2.19*   Urinalysis:  Recent Labs Lab 05/06/14 0231  COLORURINE YELLOW  LABSPEC 1.020  PHURINE 6.0  GLUCOSEU NEGATIVE  HGBUR SMALL*  BILIRUBINUR NEGATIVE  KETONESUR 15*  PROTEINUR NEGATIVE  UROBILINOGEN 0.2  NITRITE POSITIVE*  LEUKOCYTESUR MODERATE*    Micro Results: Recent Results (from the  past 240 hour(s))  Culture, Urine     Status: None   Collection Time: 05/06/14  2:31 AM  Result Value Ref Range Status   Specimen Description URINE, CATHETERIZED  Final   Special Requests NONE  Final   Colony Count   Final    >=100,000 COLONIES/ML Performed at [facility]    Culture   Final    ESCHERICHIA COLI Performed at [facility]    Report Status 05/08/2014 FINAL  Final   Organism ID, Bacteria ESCHERICHIA COLI  Final      Susceptibility   Escherichia coli - MIC*    AMPICILLIN <=2 SENSITIVE Sensitive     CEFAZOLIN <=4 SENSITIVE Sensitive     CEFTRIAXONE <=1 SENSITIVE Sensitive     CIPROFLOXACIN <=0.25 SENSITIVE Sensitive     GENTAMICIN <=1 SENSITIVE Sensitive     LEVOFLOXACIN <=0.12 SENSITIVE Sensitive     NITROFURANTOIN <=16 SENSITIVE Sensitive     TOBRAMYCIN <=1 SENSITIVE Sensitive     TRIMETH/SULFA <=20 SENSITIVE Sensitive     PIP/TAZO <=4 SENSITIVE Sensitive     * ESCHERICHIA COLI  Surgical pcr screen     Status: None  Collection Time: 05/07/14 12:33 PM  Result Value Ref Range Status   MRSA, PCR NEGATIVE NEGATIVE Final   Staphylococcus aureus NEGATIVE NEGATIVE Final    Comment:        The Xpert SA Assay (FDA approved for NASAL specimens in patients over 74 years of age), is one component of a comprehensive surveillance program.  Test performance has been validated by [facility] for patients greater than or equal to 74 year old. It is not intended to diagnose infection nor to guide or monitor treatment.    Medications: I have reviewed the patient's current medications. Scheduled Meds: . atorvastatin  20 mg Oral Daily  . buPROPion  75 mg Oral Daily  . cephALEXin  500 mg Oral Q12H  . cyanocobalamin  500 mcg Oral Daily  . docusate sodium  100 mg Oral BID  . escitalopram  20 mg Oral Daily  . lisinopril  10 mg Oral Daily  . metoprolol  75 mg Oral BID  . temazepam  30 mg Oral QHS  . warfarin  2.5 mg Oral Once per day on Wed Sat  .  [START ON 05/12/2014] warfarin  5 mg Oral Once per day on Sun Mon Tue Thu Fri  . Warfarin - Pharmacist Dosing Inpatient   Does not apply q1800   Continuous Infusions:  PRN Meds:.acetaminophen **OR** acetaminophen, HYDROcodone-acetaminophen, HYDROmorphone (DILAUDID) injection, LORazepam, menthol-cetylpyridinium **OR** phenol, methocarbamol, ondansetron **OR** ondansetron (ZOFRAN) IV Assessment/Plan: Principal Problem:   Displaced fracture of left femoral neck Active Problems:   Essential hypertension, benign   Atrial fibrillation   MITRAL VALVE REPLACEMENT, HX OF   Dementia   Leukocytosis   Fracture of femoral neck, left Left Femoral neck fracture- postop day 4 s/p left hip monopolar arthroplasty w/ Dr. Ophelia CharterYates - Pain management w/ dialudid 0.5mg  q3hr PRN and norco 5-325mg  q6h prn, pain controlled  - Coumadin per pharmacy - PT/OT consulted--> recommending SNF. Agree this is the best option after discussing with PT and patient's husband. Social work working on finding SNF placement.   UTI-- asymptomatic. UA positive for TNTC WBC, nitrites, LE, and many bacteria. Likely cause of leukocytosis.  - Urine culture growing E.coli, pansensitive - Discontinue Keflex 500 mg Q12H (start date: 05/09/14, end date: 05/11/14). Patient received total of 7 days of antibiotics (Rocephin and Keflex) which is sufficient for UTI treatment.   Paroxysmal Afib - on coumadin at home.  - Continue coumadin per pharmacy, INR 2.19 today - Continue Metoprolol 75 mg BID  HTN - At home takes lisinopril 10mg , metoprolol 75mg  BID - Continue home meds and monitor  Depression  - Continue Lexapro 20 mg daily  Anxiety  - Continue Bupropion 75 mg daily  Insomnia  - Continue Restoril 30 mg QHS  Diet: Heart healthy VTE PPx: Coumadin  Dispo: Hopefully SNF placement today   The patient does have a current PCP Lorie Phenix(Nancy Maloney, MD) and [facility] after discharge.  The patient  does not have transportation limitations that hinder transportation to clinic appointments.  .Services Needed at time of discharge: Y = Yes, Blank = No PT:   OT:   RN:   Equipment:   Other:     LOS: 6 days   Rich Numberarly Rivet, MD 05/11/2014, 9:47 AM

## 2014-05-18 ENCOUNTER — Ambulatory Visit
Admission: RE | Admit: 2014-05-18 | Discharge: 2014-05-18 | Disposition: A | Discharge status: Home / Self Care | Payer: Medicare PPO | Source: Ambulatory Visit | Attending: Endocrinology | Admitting: Endocrinology

## 2014-05-18 ENCOUNTER — Other Ambulatory Visit: Payer: Self-pay | Admitting: Endocrinology

## 2014-05-18 DIAGNOSIS — R710 Precipitous drop in hematocrit: Secondary | ICD-10-CM

## 2014-05-25 ENCOUNTER — Ambulatory Visit (INDEPENDENT_AMBULATORY_CARE_PROVIDER_SITE_OTHER): Payer: Medicare PPO

## 2014-05-25 DIAGNOSIS — Z9889 Other specified postprocedural states: Secondary | ICD-10-CM

## 2014-05-25 DIAGNOSIS — G459 Transient cerebral ischemic attack, unspecified: Secondary | ICD-10-CM

## 2014-05-25 DIAGNOSIS — Z5181 Encounter for therapeutic drug level monitoring: Secondary | ICD-10-CM

## 2014-05-25 DIAGNOSIS — Z7901 Long term (current) use of anticoagulants: Secondary | ICD-10-CM

## 2014-05-25 DIAGNOSIS — I4891 Unspecified atrial fibrillation: Secondary | ICD-10-CM

## 2014-05-25 LAB — POCT INR: INR: 2.4

## 2014-06-01 ENCOUNTER — Ambulatory Visit (INDEPENDENT_AMBULATORY_CARE_PROVIDER_SITE_OTHER): Payer: Medicare PPO

## 2014-06-01 DIAGNOSIS — Z9889 Other specified postprocedural states: Secondary | ICD-10-CM

## 2014-06-01 DIAGNOSIS — Z7901 Long term (current) use of anticoagulants: Secondary | ICD-10-CM

## 2014-06-01 DIAGNOSIS — I4891 Unspecified atrial fibrillation: Secondary | ICD-10-CM

## 2014-06-01 DIAGNOSIS — Z5181 Encounter for therapeutic drug level monitoring: Secondary | ICD-10-CM

## 2014-06-01 DIAGNOSIS — G459 Transient cerebral ischemic attack, unspecified: Secondary | ICD-10-CM

## 2014-06-01 LAB — POCT INR: INR: 1.9

## 2014-06-15 ENCOUNTER — Ambulatory Visit (INDEPENDENT_AMBULATORY_CARE_PROVIDER_SITE_OTHER): Payer: Medicare PPO

## 2014-06-15 DIAGNOSIS — Z9889 Other specified postprocedural states: Secondary | ICD-10-CM

## 2014-06-15 DIAGNOSIS — G459 Transient cerebral ischemic attack, unspecified: Secondary | ICD-10-CM

## 2014-06-15 DIAGNOSIS — I4891 Unspecified atrial fibrillation: Secondary | ICD-10-CM

## 2014-06-15 DIAGNOSIS — Z5181 Encounter for therapeutic drug level monitoring: Secondary | ICD-10-CM

## 2014-06-15 DIAGNOSIS — Z7901 Long term (current) use of anticoagulants: Secondary | ICD-10-CM

## 2014-06-15 LAB — POCT INR: INR: 1.8

## 2014-06-29 ENCOUNTER — Ambulatory Visit (INDEPENDENT_AMBULATORY_CARE_PROVIDER_SITE_OTHER): Payer: Medicare PPO

## 2014-06-29 DIAGNOSIS — I4891 Unspecified atrial fibrillation: Secondary | ICD-10-CM

## 2014-06-29 DIAGNOSIS — G459 Transient cerebral ischemic attack, unspecified: Secondary | ICD-10-CM

## 2014-06-29 DIAGNOSIS — Z7901 Long term (current) use of anticoagulants: Secondary | ICD-10-CM

## 2014-06-29 DIAGNOSIS — Z9889 Other specified postprocedural states: Secondary | ICD-10-CM

## 2014-06-29 DIAGNOSIS — Z5181 Encounter for therapeutic drug level monitoring: Secondary | ICD-10-CM

## 2014-06-29 LAB — POCT INR: INR: 4.3

## 2014-07-13 ENCOUNTER — Ambulatory Visit (INDEPENDENT_AMBULATORY_CARE_PROVIDER_SITE_OTHER): Payer: Medicare PPO | Admitting: *Deleted

## 2014-07-13 DIAGNOSIS — Z5181 Encounter for therapeutic drug level monitoring: Secondary | ICD-10-CM

## 2014-07-13 DIAGNOSIS — Z7901 Long term (current) use of anticoagulants: Secondary | ICD-10-CM

## 2014-07-13 DIAGNOSIS — G459 Transient cerebral ischemic attack, unspecified: Secondary | ICD-10-CM

## 2014-07-13 DIAGNOSIS — I4891 Unspecified atrial fibrillation: Secondary | ICD-10-CM

## 2014-07-13 DIAGNOSIS — Z9889 Other specified postprocedural states: Secondary | ICD-10-CM

## 2014-07-13 LAB — POCT INR: INR: 2.3

## 2014-07-27 ENCOUNTER — Ambulatory Visit (INDEPENDENT_AMBULATORY_CARE_PROVIDER_SITE_OTHER): Payer: Medicare PPO | Admitting: Pharmacist

## 2014-07-27 DIAGNOSIS — Z9889 Other specified postprocedural states: Secondary | ICD-10-CM

## 2014-07-27 DIAGNOSIS — G459 Transient cerebral ischemic attack, unspecified: Secondary | ICD-10-CM

## 2014-07-27 DIAGNOSIS — I4891 Unspecified atrial fibrillation: Secondary | ICD-10-CM

## 2014-07-27 DIAGNOSIS — Z7901 Long term (current) use of anticoagulants: Secondary | ICD-10-CM

## 2014-07-27 DIAGNOSIS — Z5181 Encounter for therapeutic drug level monitoring: Secondary | ICD-10-CM

## 2014-07-27 LAB — POCT INR: INR: 2.9

## 2014-08-10 ENCOUNTER — Ambulatory Visit (INDEPENDENT_AMBULATORY_CARE_PROVIDER_SITE_OTHER): Payer: Medicare PPO

## 2014-08-10 DIAGNOSIS — G459 Transient cerebral ischemic attack, unspecified: Secondary | ICD-10-CM

## 2014-08-10 DIAGNOSIS — I4891 Unspecified atrial fibrillation: Secondary | ICD-10-CM | POA: Diagnosis not present

## 2014-08-10 DIAGNOSIS — Z9889 Other specified postprocedural states: Secondary | ICD-10-CM

## 2014-08-10 DIAGNOSIS — Z7901 Long term (current) use of anticoagulants: Secondary | ICD-10-CM | POA: Diagnosis not present

## 2014-08-10 DIAGNOSIS — Z5181 Encounter for therapeutic drug level monitoring: Secondary | ICD-10-CM

## 2014-08-10 LAB — POCT INR: INR: 2.8

## 2014-08-24 ENCOUNTER — Ambulatory Visit (INDEPENDENT_AMBULATORY_CARE_PROVIDER_SITE_OTHER): Payer: Medicare PPO

## 2014-08-24 DIAGNOSIS — I4891 Unspecified atrial fibrillation: Secondary | ICD-10-CM

## 2014-08-24 DIAGNOSIS — Z9889 Other specified postprocedural states: Secondary | ICD-10-CM

## 2014-08-24 DIAGNOSIS — Z5181 Encounter for therapeutic drug level monitoring: Secondary | ICD-10-CM | POA: Diagnosis not present

## 2014-08-24 DIAGNOSIS — G459 Transient cerebral ischemic attack, unspecified: Secondary | ICD-10-CM | POA: Diagnosis not present

## 2014-08-24 DIAGNOSIS — Z7901 Long term (current) use of anticoagulants: Secondary | ICD-10-CM | POA: Diagnosis not present

## 2014-08-24 LAB — POCT INR: INR: 3.7

## 2014-09-07 ENCOUNTER — Ambulatory Visit (INDEPENDENT_AMBULATORY_CARE_PROVIDER_SITE_OTHER): Payer: Medicare PPO | Admitting: *Deleted

## 2014-09-07 DIAGNOSIS — Z9889 Other specified postprocedural states: Secondary | ICD-10-CM

## 2014-09-07 DIAGNOSIS — I4891 Unspecified atrial fibrillation: Secondary | ICD-10-CM

## 2014-09-07 DIAGNOSIS — Z5181 Encounter for therapeutic drug level monitoring: Secondary | ICD-10-CM

## 2014-09-07 DIAGNOSIS — G459 Transient cerebral ischemic attack, unspecified: Secondary | ICD-10-CM | POA: Diagnosis not present

## 2014-09-07 DIAGNOSIS — Z7901 Long term (current) use of anticoagulants: Secondary | ICD-10-CM

## 2014-09-07 LAB — POCT INR: INR: 3

## 2014-09-28 ENCOUNTER — Ambulatory Visit (INDEPENDENT_AMBULATORY_CARE_PROVIDER_SITE_OTHER): Payer: Medicare PPO

## 2014-09-28 DIAGNOSIS — I4891 Unspecified atrial fibrillation: Secondary | ICD-10-CM | POA: Diagnosis not present

## 2014-09-28 DIAGNOSIS — Z7901 Long term (current) use of anticoagulants: Secondary | ICD-10-CM | POA: Diagnosis not present

## 2014-09-28 DIAGNOSIS — Z9889 Other specified postprocedural states: Secondary | ICD-10-CM

## 2014-09-28 DIAGNOSIS — Z5181 Encounter for therapeutic drug level monitoring: Secondary | ICD-10-CM

## 2014-09-28 DIAGNOSIS — G459 Transient cerebral ischemic attack, unspecified: Secondary | ICD-10-CM

## 2014-09-28 LAB — POCT INR: INR: 3.7

## 2014-10-17 ENCOUNTER — Other Ambulatory Visit: Payer: Self-pay | Admitting: Family Medicine

## 2014-10-17 DIAGNOSIS — F32A Depression, unspecified: Secondary | ICD-10-CM

## 2014-10-17 DIAGNOSIS — F329 Major depressive disorder, single episode, unspecified: Secondary | ICD-10-CM

## 2014-10-21 ENCOUNTER — Telehealth: Payer: Self-pay | Admitting: Family Medicine

## 2014-10-26 ENCOUNTER — Ambulatory Visit (INDEPENDENT_AMBULATORY_CARE_PROVIDER_SITE_OTHER): Payer: Medicare PPO

## 2014-10-26 DIAGNOSIS — Z9889 Other specified postprocedural states: Secondary | ICD-10-CM

## 2014-10-26 DIAGNOSIS — G459 Transient cerebral ischemic attack, unspecified: Secondary | ICD-10-CM

## 2014-10-26 DIAGNOSIS — Z7901 Long term (current) use of anticoagulants: Secondary | ICD-10-CM

## 2014-10-26 DIAGNOSIS — Z5181 Encounter for therapeutic drug level monitoring: Secondary | ICD-10-CM

## 2014-10-26 DIAGNOSIS — I4891 Unspecified atrial fibrillation: Secondary | ICD-10-CM

## 2014-10-26 LAB — POCT INR: INR: 2.9

## 2014-11-23 ENCOUNTER — Ambulatory Visit (INDEPENDENT_AMBULATORY_CARE_PROVIDER_SITE_OTHER): Payer: Medicare PPO

## 2014-11-23 DIAGNOSIS — Z9889 Other specified postprocedural states: Secondary | ICD-10-CM | POA: Diagnosis not present

## 2014-11-23 DIAGNOSIS — Z5181 Encounter for therapeutic drug level monitoring: Secondary | ICD-10-CM | POA: Diagnosis not present

## 2014-11-23 DIAGNOSIS — Z7901 Long term (current) use of anticoagulants: Secondary | ICD-10-CM

## 2014-11-23 DIAGNOSIS — I4891 Unspecified atrial fibrillation: Secondary | ICD-10-CM

## 2014-11-23 DIAGNOSIS — G459 Transient cerebral ischemic attack, unspecified: Secondary | ICD-10-CM

## 2014-11-23 LAB — POCT INR: INR: 5

## 2014-12-05 ENCOUNTER — Other Ambulatory Visit: Payer: Self-pay | Admitting: Family Medicine

## 2014-12-05 NOTE — Telephone Encounter (Signed)
Ok to call in rx.  Thanks.  

## 2014-12-05 NOTE — Telephone Encounter (Signed)
Last ov was on 06/01/2014 and future ov appointment on 03/27/2015

## 2014-12-07 ENCOUNTER — Ambulatory Visit (INDEPENDENT_AMBULATORY_CARE_PROVIDER_SITE_OTHER): Payer: Medicare PPO

## 2014-12-07 DIAGNOSIS — Z5181 Encounter for therapeutic drug level monitoring: Secondary | ICD-10-CM | POA: Diagnosis not present

## 2014-12-07 DIAGNOSIS — I4891 Unspecified atrial fibrillation: Secondary | ICD-10-CM

## 2014-12-07 DIAGNOSIS — G459 Transient cerebral ischemic attack, unspecified: Secondary | ICD-10-CM

## 2014-12-07 DIAGNOSIS — Z7901 Long term (current) use of anticoagulants: Secondary | ICD-10-CM | POA: Diagnosis not present

## 2014-12-07 DIAGNOSIS — Z9889 Other specified postprocedural states: Secondary | ICD-10-CM | POA: Diagnosis not present

## 2014-12-07 LAB — POCT INR: INR: 3.1

## 2014-12-09 ENCOUNTER — Other Ambulatory Visit: Payer: Self-pay | Admitting: Family Medicine

## 2014-12-09 DIAGNOSIS — F32A Depression, unspecified: Secondary | ICD-10-CM

## 2014-12-09 DIAGNOSIS — F329 Major depressive disorder, single episode, unspecified: Secondary | ICD-10-CM

## 2014-12-27 ENCOUNTER — Other Ambulatory Visit: Payer: Self-pay | Admitting: Cardiology

## 2014-12-28 ENCOUNTER — Ambulatory Visit (INDEPENDENT_AMBULATORY_CARE_PROVIDER_SITE_OTHER): Payer: Medicare PPO

## 2014-12-28 DIAGNOSIS — Z9889 Other specified postprocedural states: Secondary | ICD-10-CM | POA: Diagnosis not present

## 2014-12-28 DIAGNOSIS — Z5181 Encounter for therapeutic drug level monitoring: Secondary | ICD-10-CM

## 2014-12-28 DIAGNOSIS — G459 Transient cerebral ischemic attack, unspecified: Secondary | ICD-10-CM | POA: Diagnosis not present

## 2014-12-28 DIAGNOSIS — Z7901 Long term (current) use of anticoagulants: Secondary | ICD-10-CM

## 2014-12-28 DIAGNOSIS — I4891 Unspecified atrial fibrillation: Secondary | ICD-10-CM | POA: Diagnosis not present

## 2014-12-28 LAB — POCT INR: INR: 5.6

## 2015-01-02 ENCOUNTER — Other Ambulatory Visit: Payer: Self-pay | Admitting: Family Medicine

## 2015-01-02 DIAGNOSIS — F32A Depression, unspecified: Secondary | ICD-10-CM

## 2015-01-02 DIAGNOSIS — F329 Major depressive disorder, single episode, unspecified: Secondary | ICD-10-CM

## 2015-01-02 NOTE — Telephone Encounter (Signed)
Approved. Ok to call in rx. Thanks.

## 2015-01-04 ENCOUNTER — Ambulatory Visit (INDEPENDENT_AMBULATORY_CARE_PROVIDER_SITE_OTHER): Payer: Medicare PPO | Admitting: Pharmacist

## 2015-01-04 DIAGNOSIS — Z5181 Encounter for therapeutic drug level monitoring: Secondary | ICD-10-CM | POA: Diagnosis not present

## 2015-01-04 DIAGNOSIS — Z9889 Other specified postprocedural states: Secondary | ICD-10-CM

## 2015-01-04 DIAGNOSIS — I4891 Unspecified atrial fibrillation: Secondary | ICD-10-CM

## 2015-01-04 DIAGNOSIS — G459 Transient cerebral ischemic attack, unspecified: Secondary | ICD-10-CM | POA: Diagnosis not present

## 2015-01-04 DIAGNOSIS — Z7901 Long term (current) use of anticoagulants: Secondary | ICD-10-CM

## 2015-01-04 LAB — POCT INR: INR: 3.1

## 2015-01-25 ENCOUNTER — Ambulatory Visit (INDEPENDENT_AMBULATORY_CARE_PROVIDER_SITE_OTHER): Payer: Medicare PPO

## 2015-01-25 DIAGNOSIS — Z9889 Other specified postprocedural states: Secondary | ICD-10-CM

## 2015-01-25 DIAGNOSIS — Z5181 Encounter for therapeutic drug level monitoring: Secondary | ICD-10-CM | POA: Diagnosis not present

## 2015-01-25 DIAGNOSIS — G459 Transient cerebral ischemic attack, unspecified: Secondary | ICD-10-CM

## 2015-01-25 DIAGNOSIS — I4891 Unspecified atrial fibrillation: Secondary | ICD-10-CM | POA: Diagnosis not present

## 2015-01-25 DIAGNOSIS — Z7901 Long term (current) use of anticoagulants: Secondary | ICD-10-CM | POA: Diagnosis not present

## 2015-01-25 LAB — POCT INR: INR: 3

## 2015-02-06 ENCOUNTER — Other Ambulatory Visit: Payer: Self-pay | Admitting: Cardiology

## 2015-02-21 ENCOUNTER — Other Ambulatory Visit: Payer: Self-pay | Admitting: Cardiology

## 2015-02-21 MED ORDER — ATORVASTATIN CALCIUM 20 MG PO TABS: 20.0000 mg | ORAL_TABLET | Freq: Every day | ORAL | Status: DC

## 2015-02-22 ENCOUNTER — Ambulatory Visit (INDEPENDENT_AMBULATORY_CARE_PROVIDER_SITE_OTHER): Payer: Medicare PPO

## 2015-02-22 DIAGNOSIS — I4891 Unspecified atrial fibrillation: Secondary | ICD-10-CM | POA: Diagnosis not present

## 2015-02-22 DIAGNOSIS — Z9889 Other specified postprocedural states: Secondary | ICD-10-CM | POA: Diagnosis not present

## 2015-02-22 DIAGNOSIS — Z5181 Encounter for therapeutic drug level monitoring: Secondary | ICD-10-CM | POA: Diagnosis not present

## 2015-02-22 DIAGNOSIS — Z7901 Long term (current) use of anticoagulants: Secondary | ICD-10-CM | POA: Diagnosis not present

## 2015-02-22 DIAGNOSIS — G459 Transient cerebral ischemic attack, unspecified: Secondary | ICD-10-CM

## 2015-02-22 LAB — POCT INR: INR: 2.1

## 2015-03-09 ENCOUNTER — Other Ambulatory Visit: Payer: Self-pay | Admitting: Cardiology

## 2015-03-15 ENCOUNTER — Ambulatory Visit (INDEPENDENT_AMBULATORY_CARE_PROVIDER_SITE_OTHER): Payer: Medicare PPO

## 2015-03-15 DIAGNOSIS — Z5181 Encounter for therapeutic drug level monitoring: Secondary | ICD-10-CM | POA: Diagnosis not present

## 2015-03-15 DIAGNOSIS — Z9889 Other specified postprocedural states: Secondary | ICD-10-CM

## 2015-03-15 DIAGNOSIS — G459 Transient cerebral ischemic attack, unspecified: Secondary | ICD-10-CM | POA: Diagnosis not present

## 2015-03-15 DIAGNOSIS — I4891 Unspecified atrial fibrillation: Secondary | ICD-10-CM | POA: Diagnosis not present

## 2015-03-15 DIAGNOSIS — Z7901 Long term (current) use of anticoagulants: Secondary | ICD-10-CM | POA: Diagnosis not present

## 2015-03-15 LAB — POCT INR: INR: 3.4

## 2015-03-21 ENCOUNTER — Other Ambulatory Visit: Payer: Self-pay | Admitting: Cardiology

## 2015-03-27 ENCOUNTER — Encounter: Payer: Self-pay | Admitting: Family Medicine

## 2015-03-27 ENCOUNTER — Ambulatory Visit (INDEPENDENT_AMBULATORY_CARE_PROVIDER_SITE_OTHER): Payer: Medicare PPO | Admitting: Family Medicine

## 2015-03-27 VITALS — BP 120/68 | HR 101 | Temp 98.8°F | Resp 16 | Ht 67.0 in | Wt 142.0 lb

## 2015-03-27 DIAGNOSIS — E785 Hyperlipidemia, unspecified: Secondary | ICD-10-CM | POA: Diagnosis not present

## 2015-03-27 DIAGNOSIS — Z8673 Personal history of transient ischemic attack (TIA), and cerebral infarction without residual deficits: Secondary | ICD-10-CM | POA: Insufficient documentation

## 2015-03-27 DIAGNOSIS — I10 Essential (primary) hypertension: Secondary | ICD-10-CM

## 2015-03-27 DIAGNOSIS — M26609 Unspecified temporomandibular joint disorder, unspecified side: Secondary | ICD-10-CM

## 2015-03-27 DIAGNOSIS — S72009A Fracture of unspecified part of neck of unspecified femur, initial encounter for closed fracture: Secondary | ICD-10-CM | POA: Insufficient documentation

## 2015-03-27 DIAGNOSIS — Z0000 Encounter for general adult medical examination without abnormal findings: Secondary | ICD-10-CM

## 2015-03-27 DIAGNOSIS — Z952 Presence of prosthetic heart valve: Secondary | ICD-10-CM | POA: Insufficient documentation

## 2015-03-27 DIAGNOSIS — F0390 Unspecified dementia without behavioral disturbance: Secondary | ICD-10-CM | POA: Diagnosis not present

## 2015-03-27 DIAGNOSIS — M26659 Arthropathy of unspecified temporomandibular joint: Secondary | ICD-10-CM | POA: Insufficient documentation

## 2015-03-27 DIAGNOSIS — M1990 Unspecified osteoarthritis, unspecified site: Secondary | ICD-10-CM | POA: Insufficient documentation

## 2015-03-27 DIAGNOSIS — F419 Anxiety disorder, unspecified: Secondary | ICD-10-CM | POA: Insufficient documentation

## 2015-03-27 DIAGNOSIS — E7800 Pure hypercholesterolemia, unspecified: Secondary | ICD-10-CM | POA: Insufficient documentation

## 2015-03-27 DIAGNOSIS — K589 Irritable bowel syndrome without diarrhea: Secondary | ICD-10-CM | POA: Insufficient documentation

## 2015-03-27 DIAGNOSIS — G4700 Insomnia, unspecified: Secondary | ICD-10-CM | POA: Insufficient documentation

## 2015-03-27 NOTE — Progress Notes (Signed)
Patient: [name] [name], Female    DOB: 08-02-1940, 74 y.o.   MRN: [id number] Visit Date: 03/27/2015  Today's Provider: Lorie PhenixNancy Gerilyn Stargell, MD   Chief Complaint  Patient presents with  . Medicare Wellness   Subjective:    Annual wellness visit [name] [name] is a 74 y.o. female. She feels well. She reports not exercising. She reports she is sleeping well.  Chronic problems stable. Patient and husband decline HCM, including shots, mammogram and bone density.    Last PCP:02/02/14 Pap:05/27/02 Negative Mammogram:05/14/2000 BI-RADS 3 -----------------------------------------------------------   Review of Systems  Constitutional: Positive for activity change and appetite change.  Eyes:       Left eye watering  Respiratory: Positive for cough. Negative for chest tightness and shortness of breath.   Cardiovascular: Negative.  Negative for chest pain, palpitations and leg swelling.  Gastrointestinal: Negative.   Endocrine: Negative.   Genitourinary: Negative.   Musculoskeletal: Positive for myalgias (Hip).  Skin: Negative.   Allergic/Immunologic: Negative.   Neurological: Negative.   Hematological: Bruises/bleeds easily.  Psychiatric/Behavioral: The patient is nervous/anxious.     Social History   Social History  . Marital Status: Married    Spouse Name: N/A  . Number of Children: N/A  . Years of Education: N/A   Occupational History  . Not on file.   Social History Main Topics  . Smoking status: Current Every Day Smoker -- 2.00 packs/day    Types: Cigarettes  . Smokeless tobacco: Current User  . Alcohol Use: No  . Drug Use: No  . Sexual Activity: Not on file   Other Topics Concern  . Not on file   Social History Narrative    Patient Active Problem List   Diagnosis Date Noted  . 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 H/O prosthetic heart valve 03/27/2015  . Cannot sleep 03/27/2015  . 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 BP (high blood  pressure) 03/27/2015  . H/O transient cerebral ischemia 03/27/2015  . Arthritis, degenerative 03/27/2015  . Closed fracture of hip (HCC) 03/27/2015  . Anxiety 03/27/2015  . Hyperlipemia 03/27/2015  . Depression 10/17/2014  . Dementia 05/06/2014  . Fracture of femoral neck, left (HCC) 05/06/2014  . Displaced fracture of left femoral neck (HCC) 05/05/2014  . Encounter for therapeutic drug monitoring 06/09/2013  . Carotid stenosis 04/08/2011  . Transient ischemic attack (TIA) 08/07/2010  . TOBACCO ABUSE 09/21/2008  . Essential hypertension, benign 09/21/2008  . Atrial fibrillation (HCC) 09/21/2008    Past Surgical History  Procedure Laterality Date  . A flutter ablation      x 2  . Mitral valve replacement      Bjork-Shiley valve placed for mitral regurgitation  . Hip arthroplasty Left 05/07/2014    Procedure: ARTHROPLASTY MONOPOLAR HIP;  Surgeon: Eldred MangesMark C Yates, MD;  [facility];  Service: Orthopedics;  Laterality: Left;    Her family history includes Heart attack in her other.    Previous Medications   ALPRAZOLAM (XANAX) 0.5 MG TABLET    TAKE 1/2 TO 1 TABLET BY MOUTH EVERY 6 HOURS AS NEEDED FOR ANXIETY   ASCORBIC ACID (VITAMIN C) 500 MG TABLET    Take 500 mg by mouth daily.     ASPIRIN EC 81 MG EC TABLET    Take 81 mg by mouth daily.     ATORVASTATIN (LIPITOR) 20 MG TABLET    TAKE 1 TABLET BY MOUTH ONCE A DAY   BUPROPION (WELLBUTRIN) 75 MG TABLET  TAKE ONE TABLET BY MOUTH EVERY MORNING   CYANOCOBALAMIN (VITAMIN B 12) 250 MCG LOZG    Take 500 mg by mouth daily.     DIPHENHYDRAMINE-ACETAMINOPHEN (TYLENOL PM EXTRA STRENGTH) 25-500 MG TABS TABLET    Take by mouth.   ESCITALOPRAM (LEXAPRO) 20 MG TABLET    TAKE 1 TABLET BY MOUTH ONCE A DAY   GUAIFENESIN (ROBITUSSIN) 100 MG/5ML LIQUID    Take 200 mg by mouth 3 (three) times daily as needed for cough.   HYDROCODONE-ACETAMINOPHEN (NORCO) 5-325 MG PER TABLET    Take 1 tablet by mouth every 6 (six) hours as needed for moderate pain.     LISINOPRIL (PRINIVIL,ZESTRIL) 10 MG TABLET    TAKE 1 TABLET BY MOUTH ONCE A DAY   METHOCARBAMOL (ROBAXIN) 500 MG TABLET    Take 1 tablet (500 mg total) by mouth 4 (four) times daily.   METOPROLOL (LOPRESSOR) 50 MG TABLET    TAKE 1 AND 1/2 TABLETS BY MOUTH TWICE DAILY   MULTIPLE VITAMINS-CALCIUM (EQL ONE DAILY WOMENS PO)    Take 1 tablet by mouth daily.     TEMAZEPAM (RESTORIL) 30 MG CAPSULE    TAKE 1 CAPSULE BY MOUTH EVERY NIGHT AT BEDTIME   WARFARIN (COUMADIN) 5 MG TABLET    Take as directed by [facility]mad[facility] Clinic    Patient Care Team: Lorie Phenix, MD as PCP - General     Objective:   Vitals: BP 120/68 mmHg  Pulse 101  Temp(Src) 98.8 F (37.1 C) (Oral)  Resp 16  Ht  (1.702 m)  Wt 142 lb (64.411 kg)  BMI 22.24 kg/m2  Physical Exam  Constitutional: She is oriented to person, place, and time. She appears well-developed and well-nourished.  Cardiovascular: Normal rate and regular rhythm.   Valve click audible.   Pulmonary/Chest: Effort normal and breath sounds normal.  Musculoskeletal: Normal range of motion.  Neurological: She is alert and oriented to person, place, and time.  Psychiatric: She has a normal mood and affect. Her behavior is normal. Judgment and thought content normal.    Activities of Daily Living In your present state of health, do you have any difficulty performing the following activities: 03/27/2015 05/06/2014  Hearing? N -  Vision? N -  Difficulty concentrating or making decisions? Y -  Walking or climbing stairs? Y -  Dressing or bathing? Y -  Doing errands, shopping? Malvin Johns    Fall Risk Assessment Fall Risk  03/27/2015 03/27/2015  Falls in the past year? Yes No  Number falls in past yr: 2 or more -  Injury with Fall? Yes -     Depression Screen PHQ 2/9 Scores 03/27/2015  PHQ - 2 Score 3  PHQ- 9 Score 9    Cognitive Testing - 6-CIT  Correct? Score   What year is it? no 4 0 or 4  What month is it? no 3 0 or 3  Memorize:    Floyde Parkins,  41,  [address]  [place],      What time is it? (within 1 hour) yes 0 0 or 3  Count backwards from 20 yes 0 0, 2, or 4  Name the months of the year yes 0 0, 2, or 4  Repeat name & address above no 10 0, 2, 4, 6, 8, or 10       TOTAL SCORE  17/28   Interpretation:  Abnormal  Normal (0-7) Abnormal (8-28)   Audit-C Alcohol Use Screening  Question  Answer Points  How often do you have alcoholic drink? never 0  On days you do drink alcohol, how many drinks do you typically consume? 0 0  How oftey will you drink 6 or more in a total? never 0  Total Score:  0   A score of 3 or more in women, and 4 or more in men indicates increased risk for alcohol abuse, EXCEPT if all of the points are from question 1.     Assessment & Plan:     Annual Wellness Visit  Reviewed patient's Family Medical History Reviewed and updated list of patient's medical providers Assessment of cognitive impairment was done Assessed patient's functional ability Established a written schedule for health screening services Health Risk Assessent Completed and Reviewed  Exercise Activities and Dietary recommendations Goals    None       There is no immunization history on file for this patient.  Health Maintenance  Topic Date Due  . INFLUENZA VACCINE  12/12/2015  . PNA vac Low Risk Adult (2 of 2 - PPSV23) 03/26/2016  . MAMMOGRAM  03/26/2017  . COLONOSCOPY  03/26/2025  . TETANUS/TDAP  03/26/2025  . DEXA SCAN  Completed  . ZOSTAVAX  Completed      Discussed health benefits of physical activity, and encouraged her to engage in regular exercise appropriate for her age and condition.   1. Medicare annual wellness visit, subsequent As above.     2. Essential hypertension Stable. Continue medication. Check labs.  - CBC with Differential/Platelet - Comprehensive Metabolic Panel (CMET)  3. Dementia, without behavioral disturbance Worsening. Continue medication. Check labs.  - TSH  4.  Hyperlipemia Condition is stable. Please continue current medication and  plan of care as noted.  Will check labs.   - Lipid panel   Lorie Phenix, MD    ------------------------------------------------------------------------------------------------------------

## 2015-03-28 ENCOUNTER — Telehealth: Payer: Self-pay

## 2015-03-28 LAB — CBC WITH DIFFERENTIAL/PLATELET
BASOS: 1 %
Basophils Absolute: 0.1 10*3/uL (ref 0.0–0.2)
EOS (ABSOLUTE): 0.3 10*3/uL (ref 0.0–0.4)
Eos: 3 %
Hematocrit: 47.8 % — ABNORMAL HIGH (ref 34.0–46.6)
Hemoglobin: 16.4 g/dL — ABNORMAL HIGH (ref 11.1–15.9)
IMMATURE GRANS (ABS): 0.1 10*3/uL (ref 0.0–0.1)
Immature Granulocytes: 1 %
LYMPHS ABS: 2.4 10*3/uL (ref 0.7–3.1)
LYMPHS: 24 %
MCH: 31.1 pg (ref 26.6–33.0)
MCHC: 34.3 g/dL (ref 31.5–35.7)
MCV: 91 fL (ref 79–97)
MONOS ABS: 0.7 10*3/uL (ref 0.1–0.9)
Monocytes: 7 %
NEUTROS ABS: 6.7 10*3/uL (ref 1.4–7.0)
Neutrophils: 64 %
PLATELETS: 243 10*3/uL (ref 150–379)
RBC: 5.27 x10E6/uL (ref 3.77–5.28)
RDW: 14.6 % (ref 12.3–15.4)
WBC: 10.1 10*3/uL (ref 3.4–10.8)

## 2015-03-28 LAB — COMPREHENSIVE METABOLIC PANEL
A/G RATIO: 1.3 (ref 1.1–2.5)
ALK PHOS: 140 IU/L — AB (ref 39–117)
ALT: 18 IU/L (ref 0–32)
AST: 19 IU/L (ref 0–40)
Albumin: 4 g/dL (ref 3.5–4.8)
BUN/Creatinine Ratio: 11 (ref 11–26)
BUN: 8 mg/dL (ref 8–27)
Bilirubin Total: 1 mg/dL (ref 0.0–1.2)
CALCIUM: 9.8 mg/dL (ref 8.7–10.3)
CO2: 24 mmol/L (ref 18–29)
Chloride: 95 mmol/L — ABNORMAL LOW (ref 97–106)
Creatinine, Ser: 0.71 mg/dL (ref 0.57–1.00)
GFR calc Af Amer: 97 mL/min/{1.73_m2} (ref >59)
GFR, EST NON AFRICAN AMERICAN: 84 mL/min/{1.73_m2} (ref >59)
Globulin, Total: 3 g/dL (ref 1.5–4.5)
Glucose: 99 mg/dL (ref 65–99)
POTASSIUM: 4.6 mmol/L (ref 3.5–5.2)
Sodium: 134 mmol/L — ABNORMAL LOW (ref 136–144)
Total Protein: 7 g/dL (ref 6.0–8.5)

## 2015-03-28 LAB — TSH: TSH: 1.8 u[IU]/mL (ref 0.450–4.500)

## 2015-03-28 LAB — LIPID PANEL
CHOL/HDL RATIO: 3.3 ratio (ref 0.0–4.4)
Cholesterol, Total: 157 mg/dL (ref 100–199)
HDL: 48 mg/dL (ref >39)
LDL CALC: 90 mg/dL (ref 0–99)
TRIGLYCERIDES: 94 mg/dL (ref 0–149)
VLDL Cholesterol Cal: 19 mg/dL (ref 5–40)

## 2015-03-28 NOTE — Telephone Encounter (Signed)
Mr. Rada HayMurrell advised.   Thanks,   -Vernona RiegerLaura

## 2015-03-28 NOTE — Telephone Encounter (Signed)
-----   Message from Lorie PhenixNancy Maloney, MD sent at 03/28/2015  7:27 AM EST ----- Labs stable except for mildly elevated hemoglobin. May be related to smoking.  Recheck in 1 month. Please notify husband. Thanks.

## 2015-04-12 ENCOUNTER — Ambulatory Visit (INDEPENDENT_AMBULATORY_CARE_PROVIDER_SITE_OTHER): Payer: Medicare PPO | Admitting: *Deleted

## 2015-04-12 DIAGNOSIS — G459 Transient cerebral ischemic attack, unspecified: Secondary | ICD-10-CM | POA: Diagnosis not present

## 2015-04-12 DIAGNOSIS — Z5181 Encounter for therapeutic drug level monitoring: Secondary | ICD-10-CM | POA: Diagnosis not present

## 2015-04-12 DIAGNOSIS — Z9889 Other specified postprocedural states: Secondary | ICD-10-CM

## 2015-04-12 DIAGNOSIS — I4891 Unspecified atrial fibrillation: Secondary | ICD-10-CM | POA: Diagnosis not present

## 2015-04-12 DIAGNOSIS — Z7901 Long term (current) use of anticoagulants: Secondary | ICD-10-CM

## 2015-04-12 LAB — POCT INR: INR: 3.2

## 2015-04-19 ENCOUNTER — Other Ambulatory Visit: Payer: Self-pay | Admitting: Family Medicine

## 2015-04-19 DIAGNOSIS — F329 Major depressive disorder, single episode, unspecified: Secondary | ICD-10-CM

## 2015-04-19 DIAGNOSIS — F32A Depression, unspecified: Secondary | ICD-10-CM

## 2015-05-11 ENCOUNTER — Telehealth: Payer: Self-pay

## 2015-05-11 ENCOUNTER — Other Ambulatory Visit: Payer: Self-pay | Admitting: Family Medicine

## 2015-05-11 DIAGNOSIS — I10 Essential (primary) hypertension: Secondary | ICD-10-CM

## 2015-05-11 NOTE — Telephone Encounter (Signed)
Lab order printed. sd

## 2015-05-12 ENCOUNTER — Telehealth: Payer: Self-pay

## 2015-05-12 LAB — CBC WITH DIFFERENTIAL/PLATELET
BASOS: 1 %
Basophils Absolute: 0.1 10*3/uL (ref 0.0–0.2)
EOS (ABSOLUTE): 0.3 10*3/uL (ref 0.0–0.4)
EOS: 4 %
HEMATOCRIT: 45.4 % (ref 34.0–46.6)
HEMOGLOBIN: 15.9 g/dL (ref 11.1–15.9)
IMMATURE GRANULOCYTES: 0 %
Immature Grans (Abs): 0 10*3/uL (ref 0.0–0.1)
Lymphocytes Absolute: 2 10*3/uL (ref 0.7–3.1)
Lymphs: 24 %
MCH: 31.6 pg (ref 26.6–33.0)
MCHC: 35 g/dL (ref 31.5–35.7)
MCV: 90 fL (ref 79–97)
MONOS ABS: 0.6 10*3/uL (ref 0.1–0.9)
Monocytes: 7 %
NEUTROS ABS: 5.2 10*3/uL (ref 1.4–7.0)
NEUTROS PCT: 64 %
Platelets: 212 10*3/uL (ref 150–379)
RBC: 5.03 x10E6/uL (ref 3.77–5.28)
RDW: 13.4 % (ref 12.3–15.4)
WBC: 8.2 10*3/uL (ref 3.4–10.8)

## 2015-05-12 NOTE — Telephone Encounter (Signed)
Advised Mr. [name] [name] of lab results. Husband verbally acknowledges understanding. Allene DillonEmily Drozdowski, CMA

## 2015-05-12 NOTE — Telephone Encounter (Signed)
-----   Message from Lorie PhenixNancy Maloney, MD sent at 05/12/2015  7:18 AM EST ----- Cbc improved.  Please notify patient's husband.   Thanks.

## 2015-05-17 ENCOUNTER — Ambulatory Visit (INDEPENDENT_AMBULATORY_CARE_PROVIDER_SITE_OTHER): Payer: Medicare PPO

## 2015-05-17 DIAGNOSIS — I4891 Unspecified atrial fibrillation: Secondary | ICD-10-CM | POA: Diagnosis not present

## 2015-05-17 DIAGNOSIS — G459 Transient cerebral ischemic attack, unspecified: Secondary | ICD-10-CM

## 2015-05-17 DIAGNOSIS — Z9889 Other specified postprocedural states: Secondary | ICD-10-CM

## 2015-05-17 DIAGNOSIS — Z7901 Long term (current) use of anticoagulants: Secondary | ICD-10-CM | POA: Diagnosis not present

## 2015-05-17 DIAGNOSIS — Z5181 Encounter for therapeutic drug level monitoring: Secondary | ICD-10-CM

## 2015-05-17 LAB — POCT INR: INR: 2.9

## 2015-05-22 ENCOUNTER — Other Ambulatory Visit: Payer: Self-pay | Admitting: Cardiology

## 2015-05-22 ENCOUNTER — Other Ambulatory Visit: Payer: Self-pay | Admitting: Family Medicine

## 2015-05-22 NOTE — Telephone Encounter (Signed)
Printed, please fax or call in to pharmacy. Thank you.   

## 2015-05-29 ENCOUNTER — Other Ambulatory Visit: Payer: Self-pay | Admitting: Family Medicine

## 2015-05-29 DIAGNOSIS — F419 Anxiety disorder, unspecified: Secondary | ICD-10-CM

## 2015-05-29 NOTE — Telephone Encounter (Signed)
Printed, please fax or call in to pharmacy. Thank you.   

## 2015-06-03 ENCOUNTER — Inpatient Hospital Stay (HOSPITAL_COMMUNITY)
Admission: EM | Admit: 2015-06-03 | Discharge: 2015-06-09 | DRG: 377 | Disposition: A | Discharge status: Home / Self Care | Payer: Medicare PPO | Attending: Internal Medicine | Admitting: Internal Medicine

## 2015-06-03 ENCOUNTER — Encounter (HOSPITAL_COMMUNITY): Payer: Self-pay | Admitting: Emergency Medicine

## 2015-06-03 ENCOUNTER — Emergency Department (HOSPITAL_COMMUNITY): Payer: Medicare PPO

## 2015-06-03 DIAGNOSIS — Z6822 Body mass index (BMI) 22.0-22.9, adult: Secondary | ICD-10-CM | POA: Diagnosis not present

## 2015-06-03 DIAGNOSIS — Z96642 Presence of left artificial hip joint: Secondary | ICD-10-CM | POA: Diagnosis present

## 2015-06-03 DIAGNOSIS — K921 Melena: Secondary | ICD-10-CM | POA: Diagnosis not present

## 2015-06-03 DIAGNOSIS — J690 Pneumonitis due to inhalation of food and vomit: Secondary | ICD-10-CM | POA: Diagnosis not present

## 2015-06-03 DIAGNOSIS — Z8673 Personal history of transient ischemic attack (TIA), and cerebral infarction without residual deficits: Secondary | ICD-10-CM

## 2015-06-03 DIAGNOSIS — Z809 Family history of malignant neoplasm, unspecified: Secondary | ICD-10-CM

## 2015-06-03 DIAGNOSIS — E876 Hypokalemia: Secondary | ICD-10-CM | POA: Diagnosis present

## 2015-06-03 DIAGNOSIS — R079 Chest pain, unspecified: Secondary | ICD-10-CM | POA: Diagnosis not present

## 2015-06-03 DIAGNOSIS — T45515A Adverse effect of anticoagulants, initial encounter: Secondary | ICD-10-CM | POA: Diagnosis present

## 2015-06-03 DIAGNOSIS — K2901 Acute gastritis with bleeding: Secondary | ICD-10-CM | POA: Diagnosis not present

## 2015-06-03 DIAGNOSIS — F419 Anxiety disorder, unspecified: Secondary | ICD-10-CM | POA: Diagnosis present

## 2015-06-03 DIAGNOSIS — Z7982 Long term (current) use of aspirin: Secondary | ICD-10-CM | POA: Diagnosis not present

## 2015-06-03 DIAGNOSIS — I484 Atypical atrial flutter: Secondary | ICD-10-CM | POA: Diagnosis not present

## 2015-06-03 DIAGNOSIS — F329 Major depressive disorder, single episode, unspecified: Secondary | ICD-10-CM | POA: Diagnosis present

## 2015-06-03 DIAGNOSIS — I10 Essential (primary) hypertension: Secondary | ICD-10-CM | POA: Diagnosis present

## 2015-06-03 DIAGNOSIS — I6529 Occlusion and stenosis of unspecified carotid artery: Secondary | ICD-10-CM | POA: Diagnosis present

## 2015-06-03 DIAGNOSIS — I4892 Unspecified atrial flutter: Secondary | ICD-10-CM | POA: Diagnosis present

## 2015-06-03 DIAGNOSIS — I4891 Unspecified atrial fibrillation: Secondary | ICD-10-CM | POA: Diagnosis not present

## 2015-06-03 DIAGNOSIS — F0390 Unspecified dementia without behavioral disturbance: Secondary | ICD-10-CM | POA: Diagnosis present

## 2015-06-03 DIAGNOSIS — K219 Gastro-esophageal reflux disease without esophagitis: Secondary | ICD-10-CM | POA: Diagnosis present

## 2015-06-03 DIAGNOSIS — T45511A Poisoning by anticoagulants, accidental (unintentional), initial encounter: Secondary | ICD-10-CM | POA: Diagnosis present

## 2015-06-03 DIAGNOSIS — F17200 Nicotine dependence, unspecified, uncomplicated: Secondary | ICD-10-CM | POA: Diagnosis present

## 2015-06-03 DIAGNOSIS — R000 Tachycardia, unspecified: Secondary | ICD-10-CM | POA: Diagnosis not present

## 2015-06-03 DIAGNOSIS — F418 Other specified anxiety disorders: Secondary | ICD-10-CM | POA: Diagnosis not present

## 2015-06-03 DIAGNOSIS — I2510 Atherosclerotic heart disease of native coronary artery without angina pectoris: Secondary | ICD-10-CM | POA: Diagnosis present

## 2015-06-03 DIAGNOSIS — Z952 Presence of prosthetic heart valve: Secondary | ICD-10-CM | POA: Diagnosis not present

## 2015-06-03 DIAGNOSIS — Y95 Nosocomial condition: Secondary | ICD-10-CM | POA: Diagnosis present

## 2015-06-03 DIAGNOSIS — I480 Paroxysmal atrial fibrillation: Secondary | ICD-10-CM | POA: Diagnosis present

## 2015-06-03 DIAGNOSIS — K922 Gastrointestinal hemorrhage, unspecified: Secondary | ICD-10-CM | POA: Diagnosis present

## 2015-06-03 DIAGNOSIS — E785 Hyperlipidemia, unspecified: Secondary | ICD-10-CM | POA: Diagnosis present

## 2015-06-03 DIAGNOSIS — Z79899 Other long term (current) drug therapy: Secondary | ICD-10-CM | POA: Diagnosis not present

## 2015-06-03 DIAGNOSIS — Z7901 Long term (current) use of anticoagulants: Secondary | ICD-10-CM | POA: Diagnosis not present

## 2015-06-03 DIAGNOSIS — K2961 Other gastritis with bleeding: Secondary | ICD-10-CM | POA: Diagnosis present

## 2015-06-03 DIAGNOSIS — D62 Acute posthemorrhagic anemia: Secondary | ICD-10-CM | POA: Diagnosis not present

## 2015-06-03 DIAGNOSIS — D72829 Elevated white blood cell count, unspecified: Secondary | ICD-10-CM

## 2015-06-03 DIAGNOSIS — R1013 Epigastric pain: Secondary | ICD-10-CM | POA: Diagnosis not present

## 2015-06-03 DIAGNOSIS — R634 Abnormal weight loss: Secondary | ICD-10-CM | POA: Diagnosis present

## 2015-06-03 DIAGNOSIS — K250 Acute gastric ulcer with hemorrhage: Secondary | ICD-10-CM | POA: Diagnosis not present

## 2015-06-03 DIAGNOSIS — J189 Pneumonia, unspecified organism: Secondary | ICD-10-CM | POA: Diagnosis not present

## 2015-06-03 HISTORY — DX: Paroxysmal atrial fibrillation: I48.0

## 2015-06-03 HISTORY — DX: Unspecified atrial flutter: I48.92

## 2015-06-03 HISTORY — DX: Unspecified dementia, unspecified severity, without behavioral disturbance, psychotic disturbance, mood disturbance, and anxiety: F03.90

## 2015-06-03 HISTORY — DX: Atherosclerotic heart disease of native coronary artery without angina pectoris: I25.10

## 2015-06-03 HISTORY — DX: Tobacco use: Z72.0

## 2015-06-03 LAB — CBC
HCT: 28 % — ABNORMAL LOW (ref 36.0–46.0)
HEMATOCRIT: 28.2 % — AB (ref 36.0–46.0)
HEMATOCRIT: 22.2 % — AB (ref 36.0–46.0)
HEMOGLOBIN: 9.3 g/dL — AB (ref 12.0–15.0)
Hemoglobin: 9.5 g/dL — ABNORMAL LOW (ref 12.0–15.0)
Hemoglobin: 7.5 g/dL — ABNORMAL LOW (ref 12.0–15.0)
MCH: 31.4 pg (ref 26.0–34.0)
MCH: 30.7 pg (ref 26.0–34.0)
MCH: 31.1 pg (ref 26.0–34.0)
MCHC: 33.9 g/dL (ref 30.0–36.0)
MCHC: 33 g/dL (ref 30.0–36.0)
MCHC: 33.8 g/dL (ref 30.0–36.0)
MCV: 92.4 fL (ref 78.0–100.0)
MCV: 93.1 fL (ref 78.0–100.0)
MCV: 92.1 fL (ref 78.0–100.0)
PLATELETS: 181 10*3/uL (ref 150–400)
PLATELETS: 150 10*3/uL (ref 150–400)
Platelets: 195 10*3/uL (ref 150–400)
RBC: 3.03 MIL/uL — ABNORMAL LOW (ref 3.87–5.11)
RBC: 3.03 MIL/uL — ABNORMAL LOW (ref 3.87–5.11)
RBC: 2.41 MIL/uL — ABNORMAL LOW (ref 3.87–5.11)
RDW: 13.7 % (ref 11.5–15.5)
RDW: 13.8 % (ref 11.5–15.5)
RDW: 13.8 % (ref 11.5–15.5)
WBC: 11.9 10*3/uL — ABNORMAL HIGH (ref 4.0–10.5)
WBC: 15.1 10*3/uL — ABNORMAL HIGH (ref 4.0–10.5)
WBC: 10 10*3/uL (ref 4.0–10.5)

## 2015-06-03 LAB — BASIC METABOLIC PANEL
Anion gap: 8 (ref 5–15)
BUN: 33 mg/dL — AB (ref 6–20)
CO2: 25 mmol/L (ref 22–32)
CREATININE: 0.68 mg/dL (ref 0.44–1.00)
Calcium: 8.5 mg/dL — ABNORMAL LOW (ref 8.9–10.3)
Chloride: 105 mmol/L (ref 101–111)
GFR calc Af Amer: >60 mL/min (ref >60)
Glucose, Bld: 162 mg/dL — ABNORMAL HIGH (ref 65–99)
Potassium: 4.4 mmol/L (ref 3.5–5.1)
SODIUM: 138 mmol/L (ref 135–145)

## 2015-06-03 LAB — PROTIME-INR
INR: >10 (ref 0.00–1.49)
INR: 1.18 (ref 0.00–1.49)
INR: 1.3 (ref 0.00–1.49)
PROTHROMBIN TIME: 16.3 s — AB (ref 11.6–15.2)
Prothrombin Time: 15.2 seconds (ref 11.6–15.2)

## 2015-06-03 LAB — TROPONIN I: Troponin I: <0.03 ng/mL (ref <0.031)

## 2015-06-03 LAB — I-STAT TROPONIN, ED: TROPONIN I, POC: 0.01 ng/mL (ref 0.00–0.08)

## 2015-06-03 LAB — MRSA PCR SCREENING: MRSA by PCR: NEGATIVE

## 2015-06-03 LAB — ABO/RH: ABO/RH(D): O NEG

## 2015-06-03 LAB — PREPARE RBC (CROSSMATCH)

## 2015-06-03 LAB — POC OCCULT BLOOD, ED: Fecal Occult Bld: POSITIVE — AB

## 2015-06-03 MED ORDER — BUPROPION HCL 75 MG PO TABS
75.0000 mg | ORAL_TABLET | Freq: Every morning | ORAL | Status: DC
  Administered 2015-06-04 – 2015-06-09 (×6): 75 mg via ORAL
  Filled 2015-06-03 (×8): qty 1

## 2015-06-03 MED ORDER — ALPRAZOLAM 0.25 MG PO TABS
0.2500 mg | ORAL_TABLET | Freq: Four times a day (QID) | ORAL | Status: DC | PRN
  Administered 2015-06-03: 0.25 mg via ORAL
  Filled 2015-06-03: qty 1

## 2015-06-03 MED ORDER — SODIUM CHLORIDE 0.9 % IV BOLUS (SEPSIS)
500.0000 mL | Freq: Once | INTRAVENOUS | Status: AC
  Administered 2015-06-03: 500 mL via INTRAVENOUS

## 2015-06-03 MED ORDER — ALPRAZOLAM 0.5 MG PO TABS
1.0000 mg | ORAL_TABLET | Freq: Every day | ORAL | Status: DC | PRN
  Administered 2015-06-04 – 2015-06-07 (×4): 1 mg via ORAL
  Filled 2015-06-03 (×4): qty 2

## 2015-06-03 MED ORDER — SODIUM CHLORIDE 0.9 % IV SOLN: Freq: Once | INTRAVENOUS | Status: AC

## 2015-06-03 MED ORDER — SODIUM CHLORIDE 0.9 % IV SOLN
INTRAVENOUS | Status: DC
  Administered 2015-06-03: 100 mL/h via INTRAVENOUS
  Administered 2015-06-03 (×2): via INTRAVENOUS
  Administered 2015-06-04 (×2): 100 mL/h via INTRAVENOUS
  Administered 2015-06-05: 04:00:00 via INTRAVENOUS

## 2015-06-03 MED ORDER — SODIUM CHLORIDE 0.9 % IV SOLN
Freq: Once | INTRAVENOUS | Status: AC
  Administered 2015-06-03: 13:00:00 via INTRAVENOUS

## 2015-06-03 MED ORDER — PROTHROMBIN COMPLEX CONC HUMAN 500 UNITS IV KIT
3093.0000 [IU] | PACK | Status: AC
  Administered 2015-06-03: 3093 [IU] via INTRAVENOUS
  Filled 2015-06-03: qty 124

## 2015-06-03 MED ORDER — TEMAZEPAM 15 MG PO CAPS
30.0000 mg | ORAL_CAPSULE | Freq: Every day | ORAL | Status: DC
  Administered 2015-06-03 – 2015-06-08 (×6): 30 mg via ORAL
  Filled 2015-06-03 (×6): qty 2

## 2015-06-03 MED ORDER — ESCITALOPRAM OXALATE 10 MG PO TABS
20.0000 mg | ORAL_TABLET | Freq: Every day | ORAL | Status: DC
  Administered 2015-06-04 – 2015-06-09 (×6): 20 mg via ORAL
  Filled 2015-06-03 (×6): qty 2

## 2015-06-03 MED ORDER — VITAMIN K1 10 MG/ML IJ SOLN
10.0000 mg | INTRAVENOUS | Status: AC
  Administered 2015-06-03: 10 mg via INTRAVENOUS
  Filled 2015-06-03: qty 1

## 2015-06-03 MED ORDER — METOPROLOL TARTRATE 50 MG PO TABS: 50.0000 mg | ORAL_TABLET | Freq: Two times a day (BID) | ORAL | Status: DC

## 2015-06-03 MED ORDER — METOPROLOL TARTRATE 50 MG PO TABS
75.0000 mg | ORAL_TABLET | Freq: Two times a day (BID) | ORAL | Status: DC
  Administered 2015-06-03: 75 mg via ORAL
  Filled 2015-06-03: qty 1

## 2015-06-03 NOTE — H&P (Signed)
Date: 06/03/2015               Patient Name:  [name] [name] MRN: [id number]  DOB: [birth year] Age / Sex: 75 y.o., female   PCP: Lorie Phenix, MD         Medical Service: Internal Medicine Teaching Service         Attending Physician: Dr. Inez Catalina, MD    First Contact: Dr. Ruben Im Pager: [phone]  Second Contact: Dr. Gara Kroner Pager: [phone]       After Hours (After 5p/  First Contact Pager: [phone]  weekends / holidays): Second Contact Pager: [phone]   Chief Complaint: Dot Been Stool  History of Present Illness:   [name] [name] is a 75 year old with a past medical history of mitral valve regurgitation s/p mitral valve replacement (1980), PAF (on chronic warfarin for both), TIAs, carotid stenosis, tobacco abuse, GERD, depression, anxiety, and dementia who presents with dark, watery stool. Given her dementia, most of history was obtained through her husband, [name] [name], who was present. She was in her normal state of health until the afternoon of 1/20 when she started to feel weak, short of breath, nauseous, and sweaty. She also felt an onset of non-radiation, chest and left arm pain. Shortly thereafter, she had an episode of black diarrhea. She fell asleep and her symptoms persisted until this morning, and the family decided to take her to the ED. She has never had an episode like this before. She has been taking her warfarin as prescribed, 5 mg every day except Sundays and Thursdays, when she takes 2.5 mg. She gets her INR checked every 5 weeks. There does not appear to be any changes in her warfarin regimen. She denies any recent changes in her diet or new medications. No recent NSAID use. Her husband said she has a slow, unintended weight loss, ~ 10 lbs, in the last year. In 2011 (158 lbs), but clinic visit in 01/2014 shows her current weight, 135 lbs. She has a chronically low appetite that is acutely worsening. She has never had a colonscopy before, and she has a history of  declining health maintenance interventions (e.g., immunizations, mammograms). She denies light-headedness, headaches, blackouts, vomiting, palpitations, cough, hemoptysis, one-sided weakness, speech difficulties, congestion, and sore throat. She has a remote history of falls, breaking her hip last year, but no recent falls. Her mood has been good. She has a family history of unknown cancer in her brother, dementia, and CAD. She has a 40 pack-year smoking history and denies any alcohol use. She lives at home with her husband, who sees after her welfare.  In the ED, she was hypotensive to 96/60 with pulse of 114. She was afebrile. She was noted to have a hemoglobin of 9.5, down from 15.9 on 05/11/15. She was FOBT positive and noted to have black stool in the rectal vault without hemorrhoids. 2U pRBCs were transfused. Her INR was noted to be >10, it was 2.9 on 05/17/15, and she she was given IV vitamin K 10 mg, 3U K-Centra, 4U FFP. GI was consulted.   Meds: Current Facility-Administered Medications  Medication Dose Route Frequency Provider Last Rate Last Dose  . 0.9 %  sodium chloride infusion   Intravenous Once Doug Sou, MD      . 0.9 %  sodium chloride infusion   Intravenous Continuous Denton Brick, MD 100 mL/hr at 06/03/15 1142    . [START ON 06/04/2015] buPROPion (WELLBUTRIN) tablet 75 mg  75 mg Oral q morning - 10a Denton Brick, MD      . Melene Muller ON 06/04/2015] escitalopram (LEXAPRO) tablet 20 mg  20 mg Oral Daily Denton Brick, MD      . phytonadione (VITAMIN K) 10 mg in dextrose 5 % 50 mL IVPB  10 mg Intravenous STAT Doug Sou, MD      . prothrombin complex conc human (KCENTRA) IVPB 3,093 Units  3,093 Units Intravenous STAT Doug Sou, MD       Current Outpatient Prescriptions  Medication Sig Dispense Refill  . ALPRAZolam (XANAX) 0.5 MG tablet TAKE 1/2 TO 1 TABLET BY MOUTH EVERY 6 HOURS AS NEEDED FOR ANXIETY 60 tablet 5  . Ascorbic Acid (VITAMIN C) 500 MG tablet Take 500 mg by mouth  daily.      [name] aspirin EC 81 MG EC tablet Take 81 mg by mouth daily.      [name] atorvastatin (LIPITOR) 20 MG tablet TAKE 1 TABLET BY MOUTH ONCE A DAY 30 tablet 0  . buPROPion (WELLBUTRIN) 75 MG tablet TAKE 1 TABLET BY MOUTH EVERY MORNING 90 tablet 1  . Cyanocobalamin (VITAMIN B 12) 250 MCG LOZG Take 500 mg by mouth daily.      . diphenhydramine-acetaminophen (TYLENOL PM EXTRA STRENGTH) 25-500 MG TABS tablet Take by mouth.    . escitalopram (LEXAPRO) 20 MG tablet TAKE 1 TABLET BY MOUTH ONCE A DAY 90 tablet 3  . guaiFENesin (ROBITUSSIN) 100 MG/5ML liquid Take 200 mg by mouth 3 (three) times daily as needed for cough.    [name] HYDROcodone-acetaminophen (NORCO) 5-325 MG per tablet Take 1 tablet by mouth every 6 (six) hours as needed for moderate pain. (Patient not taking: Reported on 03/27/2015) 60 tablet 0  . lisinopril (PRINIVIL,ZESTRIL) 10 MG tablet TAKE 1 TABLET BY MOUTH ONCE A DAY 30 tablet 0  . methocarbamol (ROBAXIN) 500 MG tablet Take 1 tablet (500 mg total) by mouth 4 (four) times daily. (Patient not taking: Reported on 03/27/2015) 60 tablet 0  . metoprolol (LOPRESSOR) 50 MG tablet TAKE 1 AND 1/2 TABLETS BY MOUTH TWICE DAILY 90 tablet 2  . Multiple Vitamins-Calcium (EQL ONE DAILY WOMENS PO) Take 1 tablet by mouth daily.      . temazepam (RESTORIL) 30 MG capsule TAKE 1 CAPSULE BY MOUTH EVERY NIGHT AT BEDTIME 30 capsule 5  . warfarin (COUMADIN) 5 MG tablet Take as directed by [facility]madin Clinic 40 tablet 3    Allergies: Allergies as of 06/03/2015 - Review Complete 06/03/2015  Allergen Reaction Noted  . Codeine Other (See Comments)   . Aspirin  03/27/2015  . Penicillins  03/27/2015   Past Medical History  Diagnosis Date  . CAD (coronary artery disease) 08/2005    non-obstructive by cath  . Atrial fibrillation (HCC)     on chronic coumadin  . Depression   . Atrial flutter (HCC)     s/p RFA x 2  . Mitral regurgitation     S/P Bjork Shiley mechanical mitral valve replacement in 1980  .  Gastroesophageal reflux disease   . TIA (transient ischemic attack)   . HTN (hypertension)   . Anxiety   . HLD (hyperlipidemia)   . Carotid stenosis     a. Carotid US (9/14):  Bilat 1-39% => f/u PRN   Past Surgical History  Procedure Laterality Date  . A flutter ablation      x 2  . Mitral valve replacement      Bjork-Shiley valve placed for mitral  regurgitation  . Hip arthroplasty Left 05/07/2014    Procedure: ARTHROPLASTY MONOPOLAR HIP;  Surgeon: Eldred Manges, MD;  [facility];  Service: Orthopedics;  Laterality: Left;   Family History  Problem Relation Age of Onset  . Heart attack Other    Social History   Social History  . Marital Status: Married    Spouse Name: N/A  . Number of Children: N/A  . Years of Education: N/A   Occupational History  . Not on file.   Social History Main Topics  . Smoking status: Current Every Day Smoker -- 2.00 packs/day    Types: Cigarettes  . Smokeless tobacco: Current User  . Alcohol Use: No  . Drug Use: No  . Sexual Activity: Not on file   Other Topics Concern  . Not on file   Social History Narrative    Review of Systems: All systems negative except per HPI  Physical Exam: Blood pressure 97/73, pulse 114, temperature 97.5 F (36.4 C), temperature source Oral, resp. rate 18, height 5\' 5"  (1.651 m), weight 135 lb (61.236 kg), SpO2 100 %. General: Lying bed, diaphoretic, pallorous HEENT: EOMI, PERRL, no scleral icterus, conjunctival pallor, no tonsillar exudate or erythema, dry mucous membranes, no cervical adenopathy Cardiovascular: Tachycardic. Normal rhythm. No murmurs, rubs, or gallops Pulmonary: Clear to auscultation bilaterally. Unlabored breathing Abdominal: Soft, exquisite tenderness to deep palpation of LUQ and LLQ. No masses or rebound. Normal bowel sound Extremities: No clubbing, cyanosis or edema Skin: Cool hands. No rashes and dry. Neurological: Alert, Oriented to person and type of place only, not name of  hospital or year. 5/5 strength in all extremities.  Psychiatric: Normal behavior and affect  Lab results: Basic Metabolic Panel:  Recent Labs  16/10/96 0959  NA 138  K 4.4  CL 105  CO2 25  GLUCOSE 162*  BUN 33*  CREATININE 0.68  CALCIUM 8.5*   CBC:  Recent Labs  06/03/15 0959 06/03/15 1140  WBC 11.9* 15.1*  HGB 9.5* 9.3*  HCT 28.0* 28.2*  MCV 92.4 93.1  PLT 181 195   Coagulation:  Recent Labs  06/03/15 0959  LABPROT >90.0*  INR >10.00*    Imaging results:  Dg Chest Port 1 View  06/03/2015  CLINICAL DATA:  75 year old female with chest pain earlier this morning and confusion currently. EXAM: PORTABLE CHEST 1 VIEW COMPARISON:  Prior chest x-ray 04/17/2009 FINDINGS: Stable cardiac and mediastinal contours. Mechanical mitral valve. Surgical clips present in the anterior mediastinum. Atherosclerotic calcifications again noted in the transverse aorta. Stable hyperinflation and bronchitic changes, particularly in the right middle lobe. There may be mild bronchiectasis. No focal infiltrate, pleural effusion, pulmonary edema or pneumothorax. Trace biapical pleural parenchymal scarring. No suspicious nodule or mass. No acute osseous abnormality. IMPRESSION: 1. No acute cardiopulmonary process. 2. Hyperinflation and central bronchitic change suggest underlying COPD. 3. Bronchitic change with possible mild bronchiectasis in the right middle lobe. Electronically Signed   By: Malachy Moan M.D.   On: 06/03/2015 11:05    Other results: EKG: sinus tachycardia.  Assessment & Plan by Problem:  Acute Gastrointestinal Bleed: She is being transfused 2 units pRBCsLikely source is upper GI tract (e.g., stomach, small bowel ulcer) given elevated BUN and melena. Nonetheless, she will require GI workup with EGD once her INR has been reversed and her vitals have stabilized. Dr. Leone Payor from GI is following. This is a complicated picture given her anticoagulation with a mechanical valve and  history of TIAs. However, her daily risk  of thromboembolic event is estimated to be no more than 0.06% (Romualdi et al., 2009). Nonetheless, her risk is likely higher given her older valve and concomitant PAF. In the setting of unstable vitals and an acute GI bleed, this is an acceptable risk for now and will focus on reversing anticoagulation at this time. - 2U pRBCs ready - INR q6h, CBC q6h; Goal Hgb above 8 - Finish Kcentra, IV vitamin K, and FFP as above - GI Consulted. EGD/Colonoscopy was INR reversed and vitals stable - NS 100 cc/hr - Admit to step down  Romualdi, E., Micieli, E., Ageno, W., & Squizzato, A. (2009). Oral anticoagulant therapy in patients with mechanical heart valve and intracranial haemorrhage. [employer], 101, 290-297.  Mechanical Mitral Valve: Holding anticoagulation as above. The European Stroke Initiative recommends patients should be restarted on Warfarin after 10 to 14 days after an acute bleed. - Heparin bridge to warfarin 10-14 days after bleed has resolved.   Paroxysmal Atrial Fibrillation: Now in sinus tachycardia. Holding rate control with metoprolol 75 mg daily in setting of hypothension - Restart metoprolol if she develops atrial fibrillation or once she is hemodynamically stable - Anticoagulation held, as above  Chest Pain: Likely due to acute bleed. EKG shows no ischemic changes. I-stat troponin negative - Cycle troponins x3  Anxiety and Depression: Reports a good mood - Bupropion 75 mg daily, escitalopram 20 mg daily - Holding benzodiazepine  HTN: Holding metoprolol and lisnopril 10 mg daily for now  HLD: Holding Atorvastatin  Code Status: Full. Confirmed with husband (HCPOA). Will discuss again in coming days. Diet: NPO DVT Prophylaxis: SCDs   Dispo: Disposition is deferred at this time, awaiting improvement of current medical problems. Anticipated discharge in approximately 3-4 day(s).   The patient does have a current PCP Lorie Phenix,  MD) and does not need an [facility] hospital follow-up appointment after discharge.  The patient does have transportation limitations that hinder transportation to clinic appointments.  Signed: Ruben Im, MD 06/03/2015, 12:01 PM

## 2015-06-03 NOTE — ED Notes (Signed)
Substernal chest pain starting at 0800; EMS arrival was pale, cool, diaphoretic. BP 98/60, HR 113. Given  ASA in route. Started on NS; 100cc infused. Reports chest pain has resolved on arrival; states she feels weak.

## 2015-06-03 NOTE — Consult Note (Signed)
Referring Provider: Dr. Sunday Shams Primary Care Physician:  Lorie Phenix, MD Primary Gastroenterologist:  unassigned  Reason for Consultation:  GI bleed   HPI: [name] [name] is a 75 y.o. female with a history of mitral regurgitation status post mitral valve replacement in 1980. She also has a history of paroxysmal atrial fibrillation and TIA and is on chronic warfarin. Echo in April 2009 showed ejection fraction 55%. She also has a history of carotid stenosis, depression, hypertension, GERD, tobacco abuse, hyperlipidemia, anxiety, and dementia.  [name] [name] was brought in by EMS today. History obtained from the patient's husband as the patient has dementia and does not know all the answers to questions. Patient reportedly complained of chest pain and weakness earlier this morning. EMS was called and she was noted to be tachycardic and hypotensive with a pulse of 113 and blood pressure of 98/60. She was given a 1000 mL of normal saline bolus and aspirin. In the ER EKG revealed sinus tachycardia. Blood work revealed a hemoglobin of 9.5 and platelets of 181,000. Prothrombin time greater than 90, INR greater than 10. Fecal occult blood positive. Patient's husband states that for about 8 days her stools have been very black. This morning she had a very dark black bowel movement. She denies abdominal pain, nausea, vomiting. She has never had an endoscopy nor has she ever had a colonoscopy. Her family is unaware of family history of colon cancer, colon polyps, or inflammatory bowel disease. She uses a baby aspirin each morning but otherwise does not use any nonsteroidal anti-inflammatory drugs.  Chart review shows that on 05/18/2014 she had had a CT of the abdomen and pelvis due to decreasing hemoglobin, question retroperitoneal bleed. At that time she had been noted to have a subcutaneous hematoma in the left gluteal soft tissues subjacent to skin staples.(She had recently had left hip surgery for  fracture after a fall.) Hemoglobin 05/11/2015 was 15.9 with an MCV of 90. INR 04/12/2015 was 3.2. Patient's husband states she recently had her warfarin dose increased.   Past Medical History  Diagnosis Date  . CAD (coronary artery disease) 08/2005    non-obstructive by cath  . Atrial fibrillation (HCC)     on chronic coumadin  . Depression   . Atrial flutter (HCC)     s/p RFA x 2  . Mitral regurgitation     S/P Bjork Shiley mechanical mitral valve replacement in 1980  . Gastroesophageal reflux disease   . TIA (transient ischemic attack)   . HTN (hypertension)   . Anxiety   . HLD (hyperlipidemia)   . Carotid stenosis     a. Carotid US (9/14):  Bilat 1-39% => f/u PRN    Past Surgical History  Procedure Laterality Date  . A flutter ablation      x 2  . Mitral valve replacement      Bjork-Shiley valve placed for mitral regurgitation  . Hip arthroplasty Left 05/07/2014    Procedure: ARTHROPLASTY MONOPOLAR HIP;  Surgeon: Eldred Manges, MD;  Location: [facility] OR;  Service: Orthopedics;  Laterality: Left;    Prior to Admission medications   Medication Sig Start Date End Date Taking? Authorizing Provider  ALPRAZolam Prudy Feeler) 0.5 MG tablet TAKE 1/2 TO 1 TABLET BY MOUTH EVERY 6 HOURS AS NEEDED FOR ANXIETY 05/29/15   Lorie Phenix, MD  Ascorbic Acid (VITAMIN C) 500 MG tablet Take 500 mg by mouth daily.      Historical Provider, MD  aspirin EC 81 MG EC  tablet Take 81 mg by mouth daily.      Historical Provider, MD  atorvastatin (LIPITOR) 20 MG tablet TAKE 1 TABLET BY MOUTH ONCE A DAY 05/22/15   Laurey Morale, MD  buPROPion [facility]) 75 MG tablet TAKE 1 TABLET BY MOUTH EVERY MORNING 04/19/15   Lorie Phenix, MD  Cyanocobalamin (VITAMIN B 12) 250 MCG LOZG Take 500 mg by mouth daily.      Historical Provider, MD  diphenhydramine-acetaminophen (TYLENOL PM EXTRA STRENGTH) 25-500 MG TABS tablet Take by mouth. 01/07/11   Historical Provider, MD  escitalopram (LEXAPRO) 20 MG tablet TAKE 1 TABLET BY  MOUTH ONCE A DAY 12/12/14   Lorie Phenix, MD  guaiFENesin (ROBITUSSIN) 100 MG/5ML liquid Take 200 mg by mouth 3 (three) times daily as needed for cough.    Historical Provider, MD  HYDROcodone-acetaminophen (NORCO) 5-325 MG per tablet Take 1 tablet by mouth every 6 (six) hours as needed for moderate pain. Patient not taking: Reported on 03/27/2015 05/09/14   Naida Sleight, PA-C  lisinopril (PRINIVIL,ZESTRIL) 10 MG tablet TAKE 1 TABLET BY MOUTH ONCE A DAY 05/22/15   Laurey Morale, MD  methocarbamol (ROBAXIN) 500 MG tablet Take 1 tablet (500 mg total) by mouth 4 (four) times daily. Patient not taking: Reported on 03/27/2015 05/09/14   Naida Sleight, PA-C  metoprolol (LOPRESSOR) 50 MG tablet TAKE 1 AND 1/2 TABLETS BY MOUTH TWICE DAILY 03/09/15   Laurey Morale, MD  Multiple Vitamins-Calcium (EQL ONE DAILY WOMENS PO) Take 1 tablet by mouth daily.      Historical Provider, MD  temazepam (RESTORIL) 30 MG capsule TAKE 1 CAPSULE BY MOUTH EVERY NIGHT AT BEDTIME 01/02/15   Lorie Phenix, MD  warfarin (COUMADIN) 5 MG tablet Take as directed by [facility] 12/27/14   Laurey Morale, MD    Current Facility-Administered Medications  Medication Dose Route Frequency Provider Last Rate Last Dose  . 0.9 %  sodium chloride infusion   Intravenous Once Doug Sou, MD      . 0.9 %  sodium chloride infusion   Intravenous Continuous Denton Brick, MD 100 mL/hr at 06/03/15 1142    . [START ON 06/04/2015] buPROPion (WELLBUTRIN) tablet 75 mg  75 mg Oral q morning - 10a Denton Brick, MD      . Melene Muller ON 06/04/2015] escitalopram (LEXAPRO) tablet 20 mg  20 mg Oral Daily Denton Brick, MD      . phytonadione (VITAMIN K) 10 mg in dextrose 5 % 50 mL IVPB  10 mg Intravenous STAT Doug Sou, MD      . prothrombin complex conc human (KCENTRA) IVPB 3,093 Units  3,093 Units Intravenous STAT Doug Sou, MD       Current Outpatient Prescriptions  Medication Sig Dispense Refill  . ALPRAZolam (XANAX) 0.5 MG tablet  TAKE 1/2 TO 1 TABLET BY MOUTH EVERY 6 HOURS AS NEEDED FOR ANXIETY 60 tablet 5  . Ascorbic Acid (VITAMIN C) 500 MG tablet Take 500 mg by mouth daily.      [name] aspirin EC 81 MG EC tablet Take 81 mg by mouth daily.      [name] atorvastatin (LIPITOR) 20 MG tablet TAKE 1 TABLET BY MOUTH ONCE A DAY 30 tablet 0  . buPROPion (WELLBUTRIN) 75 MG tablet TAKE 1 TABLET BY MOUTH EVERY MORNING 90 tablet 1  . Cyanocobalamin (VITAMIN B 12) 250 MCG LOZG Take 500 mg by mouth daily.      . diphenhydramine-acetaminophen (TYLENOL PM EXTRA STRENGTH)  25-500 MG TABS tablet Take by mouth.    . escitalopram (LEXAPRO) 20 MG tablet TAKE 1 TABLET BY MOUTH ONCE A DAY 90 tablet 3  . guaiFENesin (ROBITUSSIN) 100 MG/5ML liquid Take 200 mg by mouth 3 (three) times daily as needed for cough.    [name] HYDROcodone-acetaminophen (NORCO) 5-325 MG per tablet Take 1 tablet by mouth every 6 (six) hours as needed for moderate pain. (Patient not taking: Reported on 03/27/2015) 60 tablet 0  . lisinopril (PRINIVIL,ZESTRIL) 10 MG tablet TAKE 1 TABLET BY MOUTH ONCE A DAY 30 tablet 0  . methocarbamol (ROBAXIN) 500 MG tablet Take 1 tablet (500 mg total) by mouth 4 (four) times daily. (Patient not taking: Reported on 03/27/2015) 60 tablet 0  . metoprolol (LOPRESSOR) 50 MG tablet TAKE 1 AND 1/2 TABLETS BY MOUTH TWICE DAILY 90 tablet 2  . Multiple Vitamins-Calcium (EQL ONE DAILY WOMENS PO) Take 1 tablet by mouth daily.      . temazepam (RESTORIL) 30 MG capsule TAKE 1 CAPSULE BY MOUTH EVERY NIGHT AT BEDTIME 30 capsule 5  . warfarin (COUMADIN) 5 MG tablet Take as directed by [facility] 40 tablet 3    Allergies as of 06/03/2015 - Review Complete 06/03/2015  Allergen Reaction Noted  . Codeine Other (See Comments)   . Aspirin  03/27/2015  . Penicillins  03/27/2015    Family History  Problem Relation Age of Onset  . Heart attack Other     Social History   Social History  . Marital Status: Married    Spouse Name: N/A  . Number of Children: N/A  .  Years of Education: N/A   Occupational History  . Not on file.   Social History Main Topics  . Smoking status: Current Every Day Smoker -- 2.00 packs/day    Types: Cigarettes  . Smokeless tobacco: Current User  . Alcohol Use: No  . Drug Use: No  . Sexual Activity: Not on file   Other Topics Concern  . Not on file   Social History Narrative    Review of Systems: Unable to obtain secondary to dementia.  Physical Exam: Vital signs in last 24 hours: Temp:  [97.5 F (36.4 C)-97.8 F (36.6 C)] 97.5 F (36.4 C) (01/21 1055) Pulse Rate:  [110-114] 113 (01/21 1130) Resp:  [10-20] 20 (01/21 1130) BP: (83-96)/(52-62) 83/52 mmHg (01/21 1130) SpO2:  [97 %-100 %] 100 % (01/21 1130) Weight:  [135 lb (61.236 kg)] 135 lb (61.236 kg) (01/21 1152)   General:   Alert, pleasantly confused, in no apparent distress. Head:  Normocephalic and atraumatic. Eyes:  Sclera clear, no icterus. Conjunctiva pink. Ears:  Normal auditory acuity. Nose:  No deformity, discharge,  or lesions. Mouth:  No deformity or lesions.   Neck:  Supple; no masses or thyromegaly. Lungs:  Clear throughout to auscultation.   No wheezes, crackles, or rhonchi . Heart:  Regular rate and rhythm; systolic click appreciated Abdomen:  Soft,nontender, BS active,nonpalp mass or hsm.   Rectal:  Deferred , patient noted by ER physician to have dark maroon and black stool, guaiac-positive. Msk:  Symmetrical without gross deformities. . Pulses:  Normal pulses noted. Extremities:  Without clubbing or edema. Neurologic:  Alert, oriented to name only. Skin: Intact without significant lesions or rashes.. Psych:  Normal mood and affect.   Lab Results:  Recent Labs  06/03/15 0959 06/03/15 1140  WBC 11.9* 15.1*  HGB 9.5* 9.3*  HCT 28.0* 28.2*  PLT 181 195   BMET  Recent  Labs  06/03/15 0959  NA 138  K 4.4  CL 105  CO2 25  GLUCOSE 162*  BUN 33*  CREATININE 0.68  CALCIUM 8.5*    PT/INR  Recent Labs   06/03/15 0959  LABPROT >90.0*  INR >10.00*    Studies/Results: Dg Chest Port 1 View  06/03/2015  CLINICAL DATA:  75 year old female with chest pain earlier this morning and confusion currently. EXAM: PORTABLE CHEST 1 VIEW COMPARISON:  Prior chest x-ray 04/17/2009 FINDINGS: Stable cardiac and mediastinal contours. Mechanical mitral valve. Surgical clips present in the anterior mediastinum. Atherosclerotic calcifications again noted in the transverse aorta. Stable hyperinflation and bronchitic changes, particularly in the right middle lobe. There may be mild bronchiectasis. No focal infiltrate, pleural effusion, pulmonary edema or pneumothorax. Trace biapical pleural parenchymal scarring. No suspicious nodule or mass. No acute osseous abnormality. IMPRESSION: 1. No acute cardiopulmonary process. 2. Hyperinflation and central bronchitic change suggest underlying COPD. 3. Bronchitic change with possible mild bronchiectasis in the right middle lobe. Electronically Signed   By: Malachy Moan M.D.   On: 06/03/2015 11:05    IMPRESSION/PLAN: 75 year old female status post mitral valve replacement in 1990, with a history of paroxysmal atrial fibrillation and TIA, on chronic warfarin, presenting with melena, anemia, tachycardia, and hypertension. INR is supratherapeutic. Hypotension and tachycardia have improved with fluid bolus. She will be given K Centra, vitamin K, and FFP in an effort to normalize INR and prothrombin time. Trend hemoglobin and transfuse to keep above 8. Will need EGD and colonoscopy when INR in therapeutic range and hemodynamically stable. Will follow.    Hvozdovic, Moise Boring 06/03/2015,  Pager [phone]  Mon-Fri 8a-5p [phone] after 5p, weekends, holidays    [facility] Attending   I have taken an interval history, reviewed the chart and examined the patient. I agree with the Advanced Practitioner's note, impression and recommendations.    Melena, new anemia - acute blood loss  and overanticoagulated. Anticipate EGD once coags corrected adequately  The risks and benefits as well as alternatives of endoscopic procedure(s) have been discussed and reviewed. All questions answered. The patient agrees to proceed.  Iva Boop, MD, [facility]

## 2015-06-03 NOTE — ED Provider Notes (Signed)
CSN: [id number]     Arrival date & time 06/03/15  7829 History   First MD Initiated Contact with Patient 06/03/15 [phone]     Chief Complaint  Patient presents with  . Chest Pain     (Consider location/radiation/quality/duration/timing/severity/associated sxs/prior Treatment) HPI Level V caveat dementia history is obtained from EMS and from patient. I attempted to call patient's home, no answer Patient reportedly complained of chest pain at 8 AM today. She was noted to be hypotensive and tachycardic in the field with blood pressure 98/60 and pulse 113 and she was treated with aspirin 324 mg and a 100 mL normal saline bolus intravenously. She is presently asymptomatic, except for mild generalized weakness denies shortness of breath denies nausea or vomiting.. Past Medical History  Diagnosis Date  . CAD (coronary artery disease) 08/2005    non-obstructive by cath  . Atrial fibrillation (HCC)     on chronic coumadin  . Depression   . Atrial flutter (HCC)     s/p RFA x 2  . Mitral regurgitation     S/P Bjork Shiley mechanical mitral valve replacement in 1980  . Gastroesophageal reflux disease   . TIA (transient ischemic attack)   . HTN (hypertension)   . Anxiety   . HLD (hyperlipidemia)   . Carotid stenosis     a. Carotid US (9/14):  Bilat 1-39% => f/u PRN   Past Surgical History  Procedure Laterality Date  . A flutter ablation      x 2  . Mitral valve replacement      Bjork-Shiley valve placed for mitral regurgitation  . Hip arthroplasty Left 05/07/2014    Procedure: ARTHROPLASTY MONOPOLAR HIP;  Surgeon: Eldred Manges, MD;  Location: [facility] OR;  Service: Orthopedics;  Laterality: Left;   Family History  Problem Relation Age of Onset  . Heart attack Other    Social History  Substance Use Topics  . Smoking status: Current Every Day Smoker -- 2.00 packs/day    Types: Cigarettes  . Smokeless tobacco: Current User  . Alcohol Use: No   OB History    No data available     Review  of Systems  Unable to perform ROS: Dementia  Cardiovascular: Positive for chest pain.  Neurological: Positive for weakness.      Allergies  Codeine; Aspirin; and Penicillins  Home Medications   Prior to Admission medications   Medication Sig Start Date End Date Taking? Authorizing Provider  ALPRAZolam Prudy Feeler) 0.5 MG tablet TAKE 1/2 TO 1 TABLET BY MOUTH EVERY 6 HOURS AS NEEDED FOR ANXIETY 05/29/15   Lorie Phenix, MD  Ascorbic Acid (VITAMIN C) 500 MG tablet Take 500 mg by mouth daily.      Historical Provider, MD  aspirin EC 81 MG EC tablet Take 81 mg by mouth daily.      Historical Provider, MD  atorvastatin (LIPITOR) 20 MG tablet TAKE 1 TABLET BY MOUTH ONCE A DAY 05/22/15   Laurey Morale, MD  buPROPion [facility]) 75 MG tablet TAKE 1 TABLET BY MOUTH EVERY MORNING 04/19/15   Lorie Phenix, MD  Cyanocobalamin (VITAMIN B 12) 250 MCG LOZG Take 500 mg by mouth daily.      Historical Provider, MD  diphenhydramine-acetaminophen (TYLENOL PM EXTRA STRENGTH) 25-500 MG TABS tablet Take by mouth. 01/07/11   Historical Provider, MD  escitalopram (LEXAPRO) 20 MG tablet TAKE 1 TABLET BY MOUTH ONCE A DAY 12/12/14   Lorie Phenix, MD  guaiFENesin (ROBITUSSIN) 100 MG/5ML liquid Take 200 mg  by mouth 3 (three) times daily as needed for cough.    Historical Provider, MD  HYDROcodone-acetaminophen (NORCO) 5-325 MG per tablet Take 1 tablet by mouth every 6 (six) hours as needed for moderate pain. Patient not taking: Reported on 03/27/2015 05/09/14   Naida Sleight, PA-C  lisinopril (PRINIVIL,ZESTRIL) 10 MG tablet TAKE 1 TABLET BY MOUTH ONCE A DAY 05/22/15   Laurey Morale, MD  methocarbamol (ROBAXIN) 500 MG tablet Take 1 tablet (500 mg total) by mouth 4 (four) times daily. Patient not taking: Reported on 03/27/2015 05/09/14   Naida Sleight, PA-C  metoprolol (LOPRESSOR) 50 MG tablet TAKE 1 AND 1/2 TABLETS BY MOUTH TWICE DAILY 03/09/15   Laurey Morale, MD  Multiple Vitamins-Calcium (EQL ONE DAILY WOMENS PO) Take 1  tablet by mouth daily.      Historical Provider, MD  temazepam (RESTORIL) 30 MG capsule TAKE 1 CAPSULE BY MOUTH EVERY NIGHT AT BEDTIME 01/02/15   Lorie Phenix, MD  warfarin (COUMADIN) 5 MG tablet Take as directed by [facility] 12/27/14   Laurey Morale, MD   BP 96/60 mmHg  Pulse 114  Temp(Src) 97.8 F (36.6 C)  Resp 18  Ht 5\' 5"  (1.651 m)  SpO2 99% Physical Exam  Constitutional:  Chronically ill-appearing  HENT:  Head: Normocephalic and atraumatic.  Case membranes pale  Eyes: Conjunctivae are normal. Pupils are equal, round, and reactive to light.  Neck: Neck supple. No tracheal deviation present. No thyromegaly present.  Cardiovascular: Regular rhythm.   No murmur heard. Tachycardic midsystolic click  Pulmonary/Chest: Effort normal and breath sounds normal.  Abdominal: Soft. Bowel sounds are normal. She exhibits no distension. There is no tenderness.  Genitourinary: Guaiac positive stool.  Normal tone maroon stool, gross melena  Musculoskeletal: Normal range of motion. She exhibits no edema or tenderness.  Neurological: She is alert. No cranial nerve deficit. Coordination normal.  Oriented to name and hospital does not know month or year, moves all extremities cranial nerves II through XII grossly intact  Skin: Skin is warm and dry. No rash noted.  Psychiatric: She has a normal mood and affect.  Nursing note and vitals reviewed.   ED Course  Procedures (including critical care time) Labs Review Labs Reviewed  CBC  BASIC METABOLIC PANEL  I-STAT TROPOININ, ED    Imaging Review No results found. I have personally reviewed and evaluated these images and lab results as part of my medical decision-making.   EKG Interpretation   Date/Time:  Saturday June 03 2015 09:54:45 EST Ventricular Rate:  114 PR Interval:  134 QRS Duration: 109 QT Interval:  326 QTC Calculation: 449 R Axis:   62 Text Interpretation:  Sinus tachycardia LVH with secondary repolarization   abnormality Baseline wander in lead(s) V5 Since last tracing rate faster  Confirmed by Lateisha Thurlow  MD, Charliee Krenz ([id number]) on 06/03/2015 10:02:23 AM     11:05 AM patient remains asymptomatic. Patient's husband arrived. Patient's primary care physician is in [place] she has no gastroenterologist. Results for orders placed or performed during the hospital encounter of 06/03/15  CBC  Result Value Ref Range   WBC 11.9 (H) 4.0 - 10.5 K/uL   RBC 3.03 (L) 3.87 - 5.11 MIL/uL   Hemoglobin 9.5 (L) 12.0 - 15.0 g/dL   HCT 16.1 (L) 09.6 - 04.5 %   MCV 92.4 78.0 - 100.0 fL   MCH 31.4 26.0 - 34.0 pg   MCHC 33.9 30.0 - 36.0 g/dL   RDW 40.9 81.1 -  15.5 %   Platelets 181 150 - 400 K/uL  Basic metabolic panel  Result Value Ref Range   Sodium 138 135 - 145 mmol/L   Potassium 4.4 3.5 - 5.1 mmol/L   Chloride 105 101 - 111 mmol/L   CO2 25 22 - 32 mmol/L   Glucose, Bld 162 (H) 65 - 99 mg/dL   BUN 33 (H) 6 - 20 mg/dL   Creatinine, Ser 5.28 0.44 - 1.00 mg/dL   Calcium 8.5 (L) 8.9 - 10.3 mg/dL   GFR calc non Af Amer >60 >60 mL/min   GFR calc Af Amer >60 >60 mL/min   Anion gap 8 5 - 15  Protime-INR  Result Value Ref Range   Prothrombin Time >90.0 (H) 11.6 - 15.2 seconds   INR >10.00 (HH) 0.00 - 1.49  I-stat troponin, ED (not at [facility], [facility])  Result Value Ref Range   Troponin i, poc 0.01 0.00 - 0.08 ng/mL   Comment 3          POC occult blood, ED Provider will collect  Result Value Ref Range   Fecal Occult Bld POSITIVE (A) NEGATIVE  Type and screen  Result Value Ref Range   ABO/RH(D) O NEG    Antibody Screen NEG    Sample Expiration 06/06/2015   Prepare RBC  Result Value Ref Range   Order Confirmation ORDER PROCESSED BY BLOOD BANK   ABO/Rh  Result Value Ref Range   ABO/RH(D) O NEG    Dg Chest Port 1 View  06/03/2015  CLINICAL DATA:  75 year old female with chest pain earlier this morning and confusion currently. EXAM: PORTABLE CHEST 1 VIEW COMPARISON:  Prior chest x-ray 04/17/2009 FINDINGS: Stable  cardiac and mediastinal contours. Mechanical mitral valve. Surgical clips present in the anterior mediastinum. Atherosclerotic calcifications again noted in the transverse aorta. Stable hyperinflation and bronchitic changes, particularly in the right middle lobe. There may be mild bronchiectasis. No focal infiltrate, pleural effusion, pulmonary edema or pneumothorax. Trace biapical pleural parenchymal scarring. No suspicious nodule or mass. No acute osseous abnormality. IMPRESSION: 1. No acute cardiopulmonary process. 2. Hyperinflation and central bronchitic change suggest underlying COPD. 3. Bronchitic change with possible mild bronchiectasis in the right middle lobe. Electronically Signed   By: Malachy Moan M.D.   On: 06/03/2015 11:05    MDM  Patient felt helped life-threatening bleeding with hypotension, tachycardia. She is markedly toxic on Coumadin. K Centra, intravenous vitamin K, and fresh frozen plasma transfusion as well as what red blood cell transfusions ordered in order to reverse Coumadin Final diagnoses:  None   I consulted with Dr.Truong from internal medicine service will arrange for admission to stepdown unit and see patient in the ED. I also consulted with Dr. Leone Payor from GI service will see patient while in the hospital Diagnosis #1 acute upper gastrointestinal bleed #2 chest pain #3 hypotension #4 anemia #5 Coumadin toxicity CRITICAL CARE Performed by: Doug Sou Total critical care time: 60 minutes Critical care time was exclusive of separately billable procedures and treating other patients. Critical care was necessary to treat or prevent imminent or life-threatening deterioration. Critical care was time spent personally by me on the following activities: development of treatment plan with patient and/or surrogate as well as nursing, discussions with consultants, evaluation of patient's response to treatment, examination of patient, obtaining history from patient or  surrogate, ordering and performing treatments and interventions, ordering and review of laboratory studies, ordering and review of radiographic studies, pulse oximetry and re-evaluation of patient's condition.  Doug Sou, MD 06/03/15 1154

## 2015-06-04 ENCOUNTER — Encounter (HOSPITAL_COMMUNITY)
Admission: EM | Disposition: A | Discharge status: Home / Self Care | Payer: Self-pay | Source: Home / Self Care | Attending: Internal Medicine

## 2015-06-04 ENCOUNTER — Encounter (HOSPITAL_COMMUNITY): Payer: Self-pay

## 2015-06-04 DIAGNOSIS — I4891 Unspecified atrial fibrillation: Secondary | ICD-10-CM

## 2015-06-04 DIAGNOSIS — Z7901 Long term (current) use of anticoagulants: Secondary | ICD-10-CM

## 2015-06-04 DIAGNOSIS — K2901 Acute gastritis with bleeding: Secondary | ICD-10-CM

## 2015-06-04 DIAGNOSIS — Z952 Presence of prosthetic heart valve: Secondary | ICD-10-CM

## 2015-06-04 DIAGNOSIS — K922 Gastrointestinal hemorrhage, unspecified: Secondary | ICD-10-CM | POA: Insufficient documentation

## 2015-06-04 DIAGNOSIS — K2961 Other gastritis with bleeding: Secondary | ICD-10-CM | POA: Diagnosis present

## 2015-06-04 DIAGNOSIS — R079 Chest pain, unspecified: Secondary | ICD-10-CM

## 2015-06-04 DIAGNOSIS — R1013 Epigastric pain: Secondary | ICD-10-CM

## 2015-06-04 HISTORY — PX: ESOPHAGOGASTRODUODENOSCOPY: SHX5428

## 2015-06-04 LAB — CBC
HCT: 31.3 % — ABNORMAL LOW (ref 36.0–46.0)
HCT: 30 % — ABNORMAL LOW (ref 36.0–46.0)
HCT: 27.9 % — ABNORMAL LOW (ref 36.0–46.0)
HEMATOCRIT: 27.5 % — AB (ref 36.0–46.0)
HEMOGLOBIN: 10.5 g/dL — AB (ref 12.0–15.0)
HEMOGLOBIN: 9.6 g/dL — AB (ref 12.0–15.0)
Hemoglobin: 10.7 g/dL — ABNORMAL LOW (ref 12.0–15.0)
Hemoglobin: 9.7 g/dL — ABNORMAL LOW (ref 12.0–15.0)
MCH: 30.6 pg (ref 26.0–34.0)
MCH: 30.7 pg (ref 26.0–34.0)
MCH: 30.8 pg (ref 26.0–34.0)
MCH: 30.9 pg (ref 26.0–34.0)
MCHC: 34.2 g/dL (ref 30.0–36.0)
MCHC: 34.8 g/dL (ref 30.0–36.0)
MCHC: 34.9 g/dL (ref 30.0–36.0)
MCHC: 35 g/dL (ref 30.0–36.0)
MCV: 88.1 fL (ref 78.0–100.0)
MCV: 88.2 fL (ref 78.0–100.0)
MCV: 88.3 fL (ref 78.0–100.0)
MCV: 89.4 fL (ref 78.0–100.0)
PLATELETS: 148 10*3/uL — AB (ref 150–400)
PLATELETS: 139 10*3/uL — AB (ref 150–400)
Platelets: 133 10*3/uL — ABNORMAL LOW (ref 150–400)
Platelets: 152 10*3/uL (ref 150–400)
RBC: 3.12 MIL/uL — ABNORMAL LOW (ref 3.87–5.11)
RBC: 3.16 MIL/uL — AB (ref 3.87–5.11)
RBC: 3.4 MIL/uL — AB (ref 3.87–5.11)
RBC: 3.5 MIL/uL — ABNORMAL LOW (ref 3.87–5.11)
RDW: 15.9 % — AB (ref 11.5–15.5)
RDW: 16.2 % — ABNORMAL HIGH (ref 11.5–15.5)
RDW: 16.2 % — ABNORMAL HIGH (ref 11.5–15.5)
RDW: 16.4 % — ABNORMAL HIGH (ref 11.5–15.5)
WBC: 10.6 10*3/uL — ABNORMAL HIGH (ref 4.0–10.5)
WBC: 11.3 10*3/uL — AB (ref 4.0–10.5)
WBC: 9.6 10*3/uL (ref 4.0–10.5)
WBC: 9.7 10*3/uL (ref 4.0–10.5)

## 2015-06-04 LAB — PREPARE FRESH FROZEN PLASMA
UNIT DIVISION: 0
UNIT DIVISION: 0
Unit division: 0
Unit division: 0

## 2015-06-04 LAB — BASIC METABOLIC PANEL
ANION GAP: 6 (ref 5–15)
BUN: 13 mg/dL (ref 6–20)
CHLORIDE: 110 mmol/L (ref 101–111)
CO2: 24 mmol/L (ref 22–32)
Calcium: 8.6 mg/dL — ABNORMAL LOW (ref 8.9–10.3)
Creatinine, Ser: 0.49 mg/dL (ref 0.44–1.00)
GFR calc Af Amer: >60 mL/min (ref >60)
GFR calc non Af Amer: >60 mL/min (ref >60)
Glucose, Bld: 97 mg/dL (ref 65–99)
POTASSIUM: 3.7 mmol/L (ref 3.5–5.1)
SODIUM: 140 mmol/L (ref 135–145)

## 2015-06-04 LAB — PROTIME-INR
INR: 1.21 (ref 0.00–1.49)
INR: 1.2 (ref 0.00–1.49)
Prothrombin Time: 15.5 seconds — ABNORMAL HIGH (ref 11.6–15.2)
Prothrombin Time: 15.3 seconds — ABNORMAL HIGH (ref 11.6–15.2)

## 2015-06-04 SURGERY — EGD (ESOPHAGOGASTRODUODENOSCOPY)
Anesthesia: Moderate Sedation

## 2015-06-04 MED ORDER — FENTANYL CITRATE (PF) 100 MCG/2ML IJ SOLN
INTRAMUSCULAR | Status: AC
  Filled 2015-06-04: qty 2

## 2015-06-04 MED ORDER — MIDAZOLAM HCL 10 MG/2ML IJ SOLN
INTRAMUSCULAR | Status: DC | PRN
  Administered 2015-06-04 (×2): 2 mg via INTRAVENOUS

## 2015-06-04 MED ORDER — SODIUM CHLORIDE 0.9 % IV SOLN: INTRAVENOUS | Status: DC

## 2015-06-04 MED ORDER — MIDAZOLAM HCL 5 MG/ML IJ SOLN
INTRAMUSCULAR | Status: AC
  Filled 2015-06-04: qty 1

## 2015-06-04 MED ORDER — ALPRAZOLAM 0.25 MG PO TABS
0.2500 mg | ORAL_TABLET | Freq: Once | ORAL | Status: AC
  Administered 2015-06-04: 0.25 mg via ORAL
  Filled 2015-06-04: qty 1

## 2015-06-04 MED ORDER — METOPROLOL TARTRATE 25 MG PO TABS
75.0000 mg | ORAL_TABLET | Freq: Two times a day (BID) | ORAL | Status: DC
  Administered 2015-06-04 – 2015-06-06 (×5): 75 mg via ORAL
  Filled 2015-06-04 (×5): qty 1

## 2015-06-04 MED ORDER — WARFARIN SODIUM 5 MG PO TABS
5.0000 mg | ORAL_TABLET | Freq: Once | ORAL | Status: AC
  Administered 2015-06-04: 5 mg via ORAL
  Filled 2015-06-04: qty 1

## 2015-06-04 MED ORDER — WARFARIN - PHARMACIST DOSING INPATIENT
Freq: Every day | Status: DC
  Administered 2015-06-06: 18:00:00

## 2015-06-04 MED ORDER — PANTOPRAZOLE SODIUM 40 MG IV SOLR
40.0000 mg | Freq: Two times a day (BID) | INTRAVENOUS | Status: DC
  Administered 2015-06-04: 40 mg via INTRAVENOUS
  Filled 2015-06-04: qty 40

## 2015-06-04 MED ORDER — PANTOPRAZOLE SODIUM 40 MG PO TBEC
40.0000 mg | DELAYED_RELEASE_TABLET | Freq: Every day | ORAL | Status: DC
  Administered 2015-06-05 – 2015-06-09 (×5): 40 mg via ORAL
  Filled 2015-06-04 (×5): qty 1

## 2015-06-04 MED ORDER — SODIUM CHLORIDE 0.9 % IV SOLN
INTRAVENOUS | Status: DC
  Administered 2015-06-04: 20 mL/h via INTRAVENOUS

## 2015-06-04 MED ORDER — FENTANYL CITRATE (PF) 100 MCG/2ML IJ SOLN
INTRAMUSCULAR | Status: DC | PRN
  Administered 2015-06-04: 25 ug via INTRAVENOUS

## 2015-06-04 NOTE — Progress Notes (Signed)
ANTICOAGULATION CONSULT NOTE - Initial Consult  Pharmacy Consult for warfarin Indication: atrial fibrillation and MVR  Allergies  Allergen Reactions  . Codeine Other (See Comments)  . Aspirin     unknown  . Penicillins     Has patient had a PCN reaction causing immediate rash, facial/tongue/throat swelling, SOB or lightheadedness with hypotension: NO Has patient had a PCN reaction causing severe rash involving mucus membranes or skin necrosis: NO Has patient had a PCN reaction that required hospitalization NO Has patient had a PCN reaction occurring within the last 10 years: NO If all of the above answers are "NO", then may proceed with Cephalosporin use.    Patient Measurements: Height:  (165.1 cm) Weight: 134 lb 8 oz (61.009 kg) IBW/kg (Calculated) : 57 Heparin Dosing Weight:   Vital Signs: Temp: 97.8 F (36.6 C) (01/22 1322) Temp Source: Oral (01/22 1322) BP: 111/57 mmHg (01/22 1515) Pulse Rate: 110 (01/22 1515)  Labs:  Recent Labs  06/03/15 0959 06/03/15 1140  06/03/15 1912 06/04/15 0620 06/04/15 1130 06/04/15 1406  HGB 9.5* 9.3*  --  7.5* 10.7* 10.5*  --   HCT 28.0* 28.2*  --  22.2* 31.3* 30.0*  --   PLT 181 195  --  150 148* 152  --   LABPROT >90.0*  --   < > 16.3* 15.5*  --  15.3*  INR >10.00*  --   < > 1.30 1.21  --  1.20  CREATININE 0.68  --   --   --  0.49  --   --   TROPONINI  --  <0.03  --  <0.03  --   --   --   < > = values in this interval not displayed.  Estimated Creatinine Clearance: 54.7 mL/min (by C-G formula based on Cr of 0.49).   Medical History: Past Medical History  Diagnosis Date  . CAD (coronary artery disease) 08/2005    non-obstructive by cath  . Atrial fibrillation (HCC)     on chronic coumadin  . Depression   . Atrial flutter (HCC)     s/p RFA x 2  . Mitral regurgitation     S/P Bjork Shiley mechanical mitral valve replacement in 1980  . Gastroesophageal reflux disease   . TIA (transient ischemic attack)   . HTN  (hypertension)   . Anxiety   . HLD (hyperlipidemia)   . Carotid stenosis     a. Carotid US (9/14):  Bilat 1-39% => f/u PRN    Medications:  Prescriptions prior to admission  Medication Sig Dispense Refill Last Dose  . ALPRAZolam (XANAX) 0.5 MG tablet TAKE 1/2 TO 1 TABLET BY MOUTH EVERY 6 HOURS AS NEEDED FOR ANXIETY 60 tablet 5 06/03/2015 at Unknown time  . Ascorbic Acid (VITAMIN C) 500 MG tablet Take 500 mg by mouth daily.     06/03/2015 at Unknown time  . aspirin EC 81 MG EC tablet Take 81 mg by mouth daily.     06/03/2015 at Unknown time  . atorvastatin (LIPITOR) 20 MG tablet TAKE 1 TABLET BY MOUTH ONCE A DAY 30 tablet 0 06/03/2015 at Unknown time  . buPROPion (WELLBUTRIN) 75 MG tablet TAKE 1 TABLET BY MOUTH EVERY MORNING 90 tablet 1 06/03/2015 at Unknown time  . Cyanocobalamin (VITAMIN B 12) 250 MCG LOZG Take 500 mg by mouth daily.     06/03/2015 at Unknown time  . diphenhydramine-acetaminophen (TYLENOL PM EXTRA STRENGTH) 25-500 MG TABS tablet Take 1 tablet by mouth at bedtime.  06/02/2015 at Unknown time  . escitalopram (LEXAPRO) 20 MG tablet TAKE 1 TABLET BY MOUTH ONCE A DAY 90 tablet 3 06/02/2015 at Unknown time  . guaiFENesin (ROBITUSSIN) 100 MG/5ML liquid Take 200 mg by mouth 3 (three) times daily as needed for cough.   06/02/2015 at Unknown time  . lisinopril (PRINIVIL,ZESTRIL) 10 MG tablet TAKE 1 TABLET BY MOUTH ONCE A DAY 30 tablet 0 06/03/2015 at Unknown time  . metoprolol (LOPRESSOR) 50 MG tablet Take 75 mg by mouth 2 (two) times daily.   06/03/2015 at Unknown time  . Multiple Vitamins-Calcium (EQL ONE DAILY WOMENS PO) Take 1 tablet by mouth daily.     06/03/2015 at Unknown time  . temazepam (RESTORIL) 30 MG capsule TAKE 1 CAPSULE BY MOUTH EVERY NIGHT AT BEDTIME 30 capsule 5 06/02/2015 at Unknown time  . warfarin (COUMADIN) 5 MG tablet Take 2.5-5 mg by mouth See admin instructions. Take  every day except on sundays and thursdays take 2.5mg    06/02/2015 at Unknown time  .  HYDROcodone-acetaminophen (NORCO) 5-325 MG per tablet Take 1 tablet by mouth every 6 (six) hours as needed for moderate pain. (Patient not taking: Reported on 03/27/2015) 60 tablet 0 Not Taking at Unknown time  . methocarbamol (ROBAXIN) 500 MG tablet Take 1 tablet (500 mg total) by mouth 4 (four) times daily. (Patient not taking: Reported on 03/27/2015) 60 tablet 0 Not Taking at Unknown time  . warfarin (COUMADIN) 5 MG tablet Take as directed by [facility] (Patient not taking: Reported on 06/03/2015) 40 tablet 3 Not Taking at Unknown time    Assessment: 3 yof presented to the hospital with dark stool. INR found to be >10 so it was reversed with KCentra and vitamin K. Last INR was 1.2 today, no new bleeding noted. GI ok with resuming coumadin. Pt has a very high INR goal outpatient.   Goal of Therapy:  INR 3-4 Monitor platelets by anticoagulation protocol: Yes   Plan:  - Warfarin  PO x 1 tonight - Daily INR  Hensley Aziz, Drake Leach 06/04/2015,5:29 PM

## 2015-06-04 NOTE — Op Note (Signed)
[facility] [address] [place], [address]   ENDOSCOPY PROCEDURE REPORT  PATIENT: [name] [name], [name] [name]  MR#: [id number] BIRTHDATE: [birth year] , 74  yrs. old GENDER: female ENDOSCOPIST: Iva Boop, MD, [facility] PROCEDURE DATE:  06/04/2015 PROCEDURE:  EGD w/ biopsy ASA CLASS:     Class III INDICATIONS:  melena. MEDICATIONS: Fentanyl 25 mcg IV and Versed 3 mg IV    administered by RN under my supervision < 15 mins TOPICAL ANESTHETIC: Cetacaine Spray  DESCRIPTION OF PROCEDURE: After the risks benefits and alternatives of the procedure were thoroughly explained, informed consent was obtained.  The PENTAX GASTOROSCOPE [id number] endoscope was introduced through the mouth and advanced to the second portion of the duodenum , Without limitations.  The instrument was slowly withdrawn as the mucosa was fully examined.    1) several pre-pyloric eroisons 2) Otherwise normal EGD. Retroflexed views revealed no abnormalities.     The scope was then withdrawn from the patient and the procedure completed.  COMPLICATIONS: There were no immediate complications.  ENDOSCOPIC IMPRESSION: 1) several pre-pyloric eroisons - I think source of melena in setting of INR 10 - had elevated BUN/creatinine also 2) Otherwise normal EGD  RECOMMENDATIONS: Ok to resume warfarin close f/u INR 81 mg ASA ok if needed also needs to stay on daily PPI though to reduce risk of repeat bleeding episode Home when ready per [facility]  eSigned:  Iva Boop, MD, [facility] 06/04/2015 3:10 PM    CC: Dr. Lorie Phenix

## 2015-06-04 NOTE — Progress Notes (Signed)
Subjective:  Patient does not complain of chest pain, dyspnea, or light-headedness. She has had dark stool overnight that have become increasingly lighter. She was not able to sleep last night, and her husband reports that she is usually able to at home.   Objective: Vital signs in last 24 hours: Filed Vitals:   06/04/15 0248 06/04/15 0500 06/04/15 0525 06/04/15 1022  BP: 107/75  117/77 125/76  Pulse: 110  111 114  Temp: 98.1 F (36.7 C)  97.9 F (36.6 C)   TempSrc: Oral  Oral   Resp: 24  17   Height:      Weight:  134 lb 8 oz (61.009 kg)    SpO2: 99%  99%    Weight change:   Intake/Output Summary (Last 24 hours) at 06/04/15 1221 Last data filed at 06/04/15 1058  Gross per 24 hour  Intake   2754 ml  Output   1200 ml  Net   1554 ml   Physical Exam General: Lying in bed, NAD HEENT: Sclerae anicteric. Moist mucous membranes. No tonsillar exudates or erythema.  Cardiovascular: Tachycarid. Irregularly irregular rhythem with systolic murmur Pulmonary: CTAB. Unlabored breathing Abdominal: TTP in left quadrants. Normal bowel sounds Extremities: No clubbing, cyanosis, or edema Neurological: AAOx2. Tongue midline, face symmetric. Speech normal.  Lab Results: Basic Metabolic Panel:  Recent Labs Lab 06/03/15 0959 06/04/15 0620  NA 138 140  K 4.4 3.7  CL 105 110  CO2 25 24  GLUCOSE 162* 97  BUN 33* 13  CREATININE 0.68 0.49  CALCIUM 8.5* 8.6*   CBC:  Recent Labs Lab 06/03/15 1912 06/04/15 0620  WBC 10.0 11.3*  HGB 7.5* 10.7*  HCT 22.2* 31.3*  MCV 92.1 89.4  PLT 150 148*   Cardiac Enzymes:  Recent Labs Lab 06/03/15 1140 06/03/15 1912  TROPONINI <0.03 <0.03   Coagulation:  Recent Labs Lab 06/03/15 0959 06/03/15 1615 06/03/15 1912 06/04/15 0620  LABPROT >90.0* 15.2 16.3* 15.5*  INR >10.00* 1.18 1.30 1.21   Studies/Results: Dg Chest Port 1 View  06/03/2015  CLINICAL DATA:  75 year old female with chest pain earlier this morning and confusion  currently. EXAM: PORTABLE CHEST 1 VIEW COMPARISON:  Prior chest x-ray 04/17/2009 FINDINGS: Stable cardiac and mediastinal contours. Mechanical mitral valve. Surgical clips present in the anterior mediastinum. Atherosclerotic calcifications again noted in the transverse aorta. Stable hyperinflation and bronchitic changes, particularly in the right middle lobe. There may be mild bronchiectasis. No focal infiltrate, pleural effusion, pulmonary edema or pneumothorax. Trace biapical pleural parenchymal scarring. No suspicious nodule or mass. No acute osseous abnormality. IMPRESSION: 1. No acute cardiopulmonary process. 2. Hyperinflation and central bronchitic change suggest underlying COPD. 3. Bronchitic change with possible mild bronchiectasis in the right middle lobe. Electronically Signed   By: Malachy Moan M.D.   On: 06/03/2015 11:05   Medications: I have reviewed the patient's current medications. Scheduled Meds: . buPROPion  75 mg Oral q morning - 10a  . escitalopram  20 mg Oral Daily  . metoprolol  75 mg Oral BID  . pantoprazole (PROTONIX) IV  40 mg Intravenous Q12H  . temazepam  30 mg Oral QHS   Continuous Infusions: . sodium chloride 100 mL/hr (06/04/15 1021)   PRN Meds:.ALPRAZolam Assessment/Plan:  Gastrointestinal Bleed: Likely upper source given melena and elevated BUN. It appears her melena is improving and her Hgb has improved to 10.7 post transfusion from a nadir of 7.5. Holding anticoagulation for now. GI is following and EGD is scheduled today. - CBC/INR  q6h for goal Hgb above 8 - F/u EGD results - Start protonix  IV BID - Step down unit - NPO - NS 100 cc/hr  Mechanical Mitral Valve: Holding anticoagulation as above. Estimated daily risk for thromboembolism due to valve is 0.06% (Romualdi et al., 2009, see H&P), although total risk is likely higher given PAF. Will address resuming anticoagulation after GI workup is complete. If the patient has an easily addressable source  (e.g., ulcer), she can likely be resumed immediately. If she has multiple AVMs or adenocarcinoma, will have discussions with family on best approach to anticoagulation. - Hold anticoagulation for now  Paroxysmal Atrial Fibrillation: Now in sinus tachycardia to 114. Resumed home rate control this morning given her hypotension has resolved after transfusion and fluid resuscitation - Anticoagulation plan per above - Restarted Metoprolol 75 mg BID for rate control  Anxiety and Depression: Patient has intermittent episodes of agitation. Had discussion with husband, who reports that she has been taking 1 mg xanax in the afternoons for a while. Will resume home benzodiazepine schedule to prevent withdrawal. - Bupropion 75 mg daily, escitalopram 20 mg daily - Alprazolam 1 mg daily prn in afternoons (12pm-4pm) - Temazepam 30 mg qhs  Chest Pain: Resolved after transfusion, likely due to acute bleed. EKG shows no ischemic changes. I-stat troponin negative, and follow-on troponins negative  DVT Prophylaxis: SCDs  Dispo: Disposition is deferred at this time, awaiting improvement of current medical problems.  Anticipated discharge in approximately 3-4 day(s).   The patient does have a current PCP Lorie Phenix, MD) and does not need an [facility] hospital follow-up appointment after discharge.  The patient does have transportation limitations that hinder transportation to clinic appointments.  .Services Needed at time of discharge: Y = Yes, Blank = No PT:   OT:   RN:   Equipment:   Other:     LOS: 1 day   Ruben Im, MD 06/04/2015, 12:21 PM

## 2015-06-04 NOTE — Progress Notes (Signed)
  Date: 06/04/2015  Patient name: [name] [name]  Medical record number: [id number]  Date of birth: Feb 20, 1941   I have seen and evaluated [name] [name] and discussed their care with the Residency Team. Briefly, [name] [name] is a woman who came in with elevated INR and likely upper GIB with dark stools and epigastric pain.  Per chart review, her coumadin dose has remained constant, but her husband did note that she had a recent increase in her coumadin dose.  In the ED, INR was > 10.  Per family, she has had a couple of bowel movements overnight and her stool is getting lighter.  Family denies NSAIDs and recent steroid use.  FOBT +.  She received 2 units of PRBC in the ED.  She also received Kcentra, vitamin K and 2 units of FFP.  INR returned this am at 1.3.  Complicating her situation is history of a mechanical MV repair and h/o afib with stroke in the past.    PMHx, Fam Hx, and/or Soc Hx : Smoker  Filed Vitals:   06/04/15 0248 06/04/15 0525  BP: 107/75 117/77  Pulse: 110 111  Temp: 98.1 F (36.7 C) 97.9 F (36.6 C)  Resp: 24 17   ROS Eyes: Negative Endo: Negative  Physical Exam:  Gen: Alert, oriented to place, not time HENT: [place], AT Eyes: Anicteric, EOMI CV: Tachycardic, irreg irreg, + murmur Pulm: Breathing comfortably, no wheeze Abd: TTP in epigastrium, + BS Ext: Thin, no edema, no rash or wounds.   Pertinent data:  Hgb 7.5 -> 10.7 after transfusion  INR 1.21  Troponin negative X 3   CXR with bronchitic changes, but she reports no symptoms that correlate (SOB was related to anemia likely)  EKG: Sinus tachycardia, some mild changes to ST in the lateral leads, ? depression  Assessment and Plan: I have seen and evaluated the patient as outlined above. I agree with the formulated Assessment and Plan as detailed in the residents' note, with the following changes:   1. UGIB - Likely given history, elevated BUN, melena, etc - GI consulted - Trend CBC, PRBC for Hgb <  8 - INR trend - NS at 100cc/hr - Maintain in SDU for now - Await results of EGD - Start protonix  IV BID  2. Afib with RVR - Once BP stable, would restart nodal blockade, possibly with low dose metoprolol for rate control - Holding coumadin  3. H/O MVR - After work up for UGIB complete, would consider restarting heparin in the short term to avoid risk of thrombosis.   4. Chest pain - Likely related to #1 above - TnI X 3 negative - Repeat EKG and TnI for any return of chest pain  Further plan based on work up for GI bleeding.   [name] Catalina, MD 1/22/20178:37 AM

## 2015-06-05 ENCOUNTER — Encounter (HOSPITAL_COMMUNITY): Payer: Self-pay | Admitting: Internal Medicine

## 2015-06-05 DIAGNOSIS — F418 Other specified anxiety disorders: Secondary | ICD-10-CM

## 2015-06-05 DIAGNOSIS — I480 Paroxysmal atrial fibrillation: Secondary | ICD-10-CM

## 2015-06-05 DIAGNOSIS — K250 Acute gastric ulcer with hemorrhage: Secondary | ICD-10-CM

## 2015-06-05 LAB — HEPARIN LEVEL (UNFRACTIONATED): Heparin Unfractionated: 0.3 IU/mL (ref 0.30–0.70)

## 2015-06-05 LAB — CBC
HCT: 28.7 % — ABNORMAL LOW (ref 36.0–46.0)
Hemoglobin: 9.9 g/dL — ABNORMAL LOW (ref 12.0–15.0)
MCH: 30.7 pg (ref 26.0–34.0)
MCHC: 34.5 g/dL (ref 30.0–36.0)
MCV: 88.9 fL (ref 78.0–100.0)
PLATELETS: 151 10*3/uL (ref 150–400)
RBC: 3.23 MIL/uL — AB (ref 3.87–5.11)
RDW: 16.5 % — ABNORMAL HIGH (ref 11.5–15.5)
WBC: 11.3 10*3/uL — ABNORMAL HIGH (ref 4.0–10.5)

## 2015-06-05 LAB — BASIC METABOLIC PANEL
Anion gap: 7 (ref 5–15)
BUN: 7 mg/dL (ref 6–20)
CO2: 21 mmol/L — ABNORMAL LOW (ref 22–32)
CREATININE: 0.57 mg/dL (ref 0.44–1.00)
Calcium: 8.3 mg/dL — ABNORMAL LOW (ref 8.9–10.3)
Chloride: 110 mmol/L (ref 101–111)
GFR calc Af Amer: >60 mL/min (ref >60)
GLUCOSE: 92 mg/dL (ref 65–99)
POTASSIUM: 3.1 mmol/L — AB (ref 3.5–5.1)
Sodium: 138 mmol/L (ref 135–145)

## 2015-06-05 LAB — PROTIME-INR
INR: 1.42 (ref 0.00–1.49)
Prothrombin Time: 17.5 seconds — ABNORMAL HIGH (ref 11.6–15.2)

## 2015-06-05 MED ORDER — POTASSIUM CHLORIDE CRYS ER 20 MEQ PO TBCR
40.0000 meq | EXTENDED_RELEASE_TABLET | Freq: Once | ORAL | Status: AC
  Administered 2015-06-05: 40 meq via ORAL
  Filled 2015-06-05: qty 2

## 2015-06-05 MED ORDER — WARFARIN SODIUM 5 MG PO TABS
5.0000 mg | ORAL_TABLET | Freq: Once | ORAL | Status: AC
  Administered 2015-06-05: 5 mg via ORAL
  Filled 2015-06-05: qty 1

## 2015-06-05 MED ORDER — ACETAMINOPHEN 325 MG PO TABS
650.0000 mg | ORAL_TABLET | Freq: Four times a day (QID) | ORAL | Status: DC | PRN
  Administered 2015-06-05 – 2015-06-06 (×4): 650 mg via ORAL
  Filled 2015-06-05 (×4): qty 2

## 2015-06-05 MED ORDER — HEPARIN (PORCINE) IN NACL 100-0.45 UNIT/ML-% IJ SOLN
1000.0000 [IU]/h | INTRAMUSCULAR | Status: DC
  Administered 2015-06-05: 950 [IU]/h via INTRAVENOUS
  Filled 2015-06-05 (×2): qty 250

## 2015-06-05 MED ORDER — NICOTINE 14 MG/24HR TD PT24
14.0000 mg | MEDICATED_PATCH | Freq: Every day | TRANSDERMAL | Status: DC
  Administered 2015-06-05 – 2015-06-09 (×5): 14 mg via TRANSDERMAL
  Filled 2015-06-05 (×5): qty 1

## 2015-06-05 MED ORDER — ALPRAZOLAM 0.5 MG PO TABS
0.5000 mg | ORAL_TABLET | Freq: Every day | ORAL | Status: DC | PRN
  Administered 2015-06-06 – 2015-06-07 (×2): 0.5 mg via ORAL
  Filled 2015-06-05 (×3): qty 1

## 2015-06-05 NOTE — Progress Notes (Signed)
ANTICOAGULATION CONSULT NOTE - Follow Up Consult  Pharmacy Consult for Heparin Indication: atrial fibrillation and and MVR  Patient Measurements: Height:  (165.1 cm) Weight: 141 lb 12.8 oz (64.32 kg) IBW/kg (Calculated) : 57 Heparin Dosing Weight: 64 kg (acutal) Vital Signs: Temp: 97.7 F (36.5 C) (01/23 1919) Temp Source: Oral (01/23 1919) BP: 152/95 mmHg (01/23 1919) Pulse Rate: 117 (01/23 1919) Labs:  Recent Labs  06/03/15 0959 06/03/15 1140  06/03/15 1912 06/04/15 0620  06/04/15 1406 06/04/15 1848 06/04/15 2334 06/05/15 0330 06/05/15 1829  HGB 9.5* 9.3*  --  7.5* 10.7*  < >  --  9.7* 9.6* 9.9*  --   HCT 28.0* 28.2*  --  22.2* 31.3*  < >  --  27.9* 27.5* 28.7*  --   PLT 181 195  --  150 148*  < >  --  139* 133* 151  --   LABPROT >90.0*  --   < > 16.3* 15.5*  --  15.3*  --   --  17.5*  --   INR >10.00*  --   < > 1.30 1.21  --  1.20  --   --  1.42  --   HEPARINUNFRC  --   --   --   --   --   --   --   --   --   --  0.30  CREATININE 0.68  --   --   --  0.49  --   --   --   --  0.57  --   TROPONINI  --  <0.03  --  <0.03  --   --   --   --   --   --   --   < > = values in this interval not displayed.  Estimated Creatinine Clearance: 54.7 mL/min (by C-G formula based on Cr of 0.57).   Medications:  Infusions:  . sodium chloride 50 mL/hr at 06/05/15 1409  . heparin 950 Units/hr (06/05/15 1005)    Assessment: 75 year old female on IV heparin and warfarin bridge.   Initial heparin level is therapeutic but at low end of goal.  CBC is low stable.   Goal of Therapy:  Heparin level 0.3-0.7 units/ml Monitor platelets by anticoagulation protocol: Yes   Plan:  Increase heparin to 1000 units/hr to keep at goal.  Confirm with heparin level in 8 hours.    Link Snuffer, PharmD, BCPS Clinical Pharmacist [phone]  06/05/2015,7:31 PM

## 2015-06-05 NOTE — Progress Notes (Signed)
Patient seen and examined. Case d/w residents in detail. I agree with findings and plan as documented in Dr. Marily Lente note.  Patient with no further bleeding episodes. Hg remains stable. EGD with gastric erosions which are the likely source of her bleeding. Will c/w PPI. Patient with history of mechanical MVR and Pafib. Restart a/c with heparin gtt and dose warfarin till INR is therapeutic.  PT recommending home PT on d/c

## 2015-06-05 NOTE — Evaluation (Signed)
Physical Therapy Evaluation Patient Details Name: TALIANA MERSEREAU MRN: [id number] DOB: May 12, 1941 Today's Date: 06/05/2015   History of Present Illness  Ms. Sambrano is a woman who came in with elevated INR and likely upper GIB with dark stools and epigastric pain. PMH MVR, a-fib, CVA, fall with hip replacement  Clinical Impression  Pt admitted with above diagnosis. Pt currently with functional limitations due to the deficits listed below (see PT Problem List). Pt ambulated 90', very fatigued after 45' with sveral standing rest breaks needed on way back. O2 sats 93% on RA, HR 118 bpm.  Pt will benefit from skilled PT to increase their independence and safety with mobility to allow discharge to the venue listed below.       Follow Up Recommendations Home health PT;Supervision for mobility/OOB    Equipment Recommendations  None recommended by PT    Recommendations for Other Services       Precautions / Restrictions Precautions Precautions: Fall Restrictions Weight Bearing Restrictions: No      Mobility  Bed Mobility Overal bed mobility: Needs Assistance Bed Mobility: Supine to Sit;Sit to Supine     Supine to sit: Supervision Sit to supine: Min assist   General bed mobility comments: pt able to get self to EOB with increased time and HOB elevated, for return to bed, pt able to get legs in bed but needed min A to reposition self  Transfers Overall transfer level: Needs assistance Equipment used: Rolling walker (2 wheeled) Transfers: Sit to/from [employer] Sit to Stand: Min assist Stand pivot transfers: Min assist       General transfer comment: pt felt achy with initial standing, min HHA for turning to [facility], then use of RW and min A for ambulation.   Ambulation/Gait Ambulation/Gait assistance: Min assist Ambulation Distance (Feet): 90 Feet Assistive device: Rolling walker (2 wheeled) Gait Pattern/deviations: Step-through pattern;Trunk flexed Gait  velocity: decreased Gait velocity interpretation: Below normal speed for age/gender General Gait Details: pt fatigued very quickly with gait, required standing rest break after 45' and then again before making it back to room due to fatigue, DOE 3/4 though O2 sats 93%, HR 118 bpm.   Stairs            Wheelchair Mobility    Modified Rankin (Stroke Patients Only)       Balance Overall balance assessment: Needs assistance Sitting-balance support: No upper extremity supported Sitting balance-Leahy Scale: Fair     Standing balance support: Bilateral upper extremity supported Standing balance-Leahy Scale: Poor Standing balance comment: reliance on RW                             Pertinent Vitals/Pain Pain Assessment: Faces Faces Pain Scale: Hurts even more Pain Location: left rib cage Pain Descriptors / Indicators: Burning Pain Intervention(s): Monitored during session;Repositioned  See gait    Home Living Family/patient expects to be discharged to:: Private residence Living Arrangements: Spouse/significant other Available Help at Discharge: Family;Available 24 hours/day Type of Home: House Home Access: Stairs to enter Entrance Stairs-Rails: Can reach both;Right;Left Entrance Stairs-Number of Steps: 4 Home Layout: One level Home Equipment: Walker - 2 wheels;Cane - single point Additional Comments: pt has been ambulating with SPC since her hip surgery    Prior Function Level of Independence: Independent with assistive device(s)               Hand Dominance        Extremity/Trunk  Assessment   Upper Extremity Assessment: Generalized weakness           Lower Extremity Assessment: Generalized weakness      Cervical / Trunk Assessment: Kyphotic  Communication   Communication: No difficulties  Cognition Arousal/Alertness: Awake/alert Behavior During Therapy: WFL for tasks assessed/performed Overall Cognitive Status: History of cognitive  impairments - at baseline       Memory: Decreased short-term memory              General Comments General comments (skin integrity, edema, etc.): pt asked her husband the same questions several times throughout session, assume that STM loss is baseline for her    Exercises        Assessment/Plan    PT Assessment Patient needs continued PT services  PT Diagnosis Difficulty walking;Generalized weakness   PT Problem List Decreased strength;Decreased activity tolerance;Decreased balance;Decreased mobility;Decreased knowledge of precautions;Cardiopulmonary status limiting activity  PT Treatment Interventions DME instruction;Gait training;Stair training;Functional mobility training;Therapeutic activities;Therapeutic exercise;Balance training;Patient/family education   PT Goals (Current goals can be found in the Care Plan section) Acute Rehab PT Goals Patient Stated Goal: return home PT Goal Formulation: With patient Time For Goal Achievement: 06/19/15 Potential to Achieve Goals: Good    Frequency Min 3X/week   Barriers to discharge        Co-evaluation               End of Session Equipment Utilized During Treatment: Gait belt Activity Tolerance: Patient limited by fatigue Patient left: in bed;with call bell/phone within reach;with family/visitor present;with bed alarm set Nurse Communication: Mobility status         Time: 1610-9604 PT Time Calculation (min) (ACUTE ONLY): 28 min   Charges:   PT Evaluation $PT Eval Moderate Complexity: 1 Procedure PT Treatments $Gait Training: 8-22 mins   PT G Codes:      Lyanne Co, PT  Acute Rehab Services  [phone]   [place], [place] 06/05/2015, 1:24 PM

## 2015-06-05 NOTE — Progress Notes (Signed)
Patient had a 13 beat run of Kirby Funk, MD aware. Patient is sleeping soundly. Will continue to monitor.

## 2015-06-05 NOTE — Progress Notes (Signed)
Subjective:  Patient does not complain of chest pain, dyspnea, or light-headedness. She is recovering well from her EGD. She has not had any BMs since her EGD. She slept better last night, son spent the night.  Objective: Vital signs in last 24 hours: Filed Vitals:   06/05/15 0400 06/05/15 0531 06/05/15 0735 06/05/15 1153  BP: 133/78 136/78 129/67   Pulse: 113  105   Temp: 98.4 F (36.9 C)  99.7 F (37.6 C) 97.8 F (36.6 C)  TempSrc:   Oral Oral  Resp: Height:      Weight: 141 lb 12.8 oz (64.32 kg)     SpO2: 95%  97% 96%   Weight change: 6 lb 12.8 oz (3.084 kg)  Intake/Output Summary (Last 24 hours) at 06/05/15 1156 Last data filed at 06/05/15 1610  Gross per 24 hour  Intake    320 ml  Output   2100 ml  Net  -1780 ml   Physical Exam General: Lying in bed, NAD HEENT: Sclerae anicteric. Moist mucous membranes. No tonsillar exudates or erythema.  Cardiovascular: Tachycardic. Irregularly irregular rhythm with systolic murmur Pulmonary: CTAB. Unlabored breathing Abdominal: TTP in epigastric region. Normal bowel sounds Extremities: No clubbing, cyanosis, or edema Neurological: AAOx2. Tongue midline, face symmetric. Speech normal.  Lab Results: Basic Metabolic Panel:  Recent Labs Lab 06/04/15 0620 06/05/15 0330  NA 140 138  K 3.7 3.1*  CL 110 110  CO2 24 21*  GLUCOSE 97 92  BUN 13 7  CREATININE 0.49 0.57  CALCIUM 8.6* 8.3*   CBC:  Recent Labs Lab 06/04/15 2334 06/05/15 0330  WBC 10.6* 11.3*  HGB 9.6* 9.9*  HCT 27.5* 28.7*  MCV 88.1 88.9  PLT 133* 151    Coagulation:  Recent Labs Lab 06/03/15 1912 06/04/15 0620 06/04/15 1406 06/05/15 0330  LABPROT 16.3* 15.5* 15.3* 17.5*  INR 1.30 1.21 1.20 1.42   Studies/Results: No results found. Medications: I have reviewed the patient's current medications. Scheduled Meds: . buPROPion  75 mg Oral q morning - 10a  . escitalopram  20 mg Oral Daily  . metoprolol  75 mg Oral BID  .  pantoprazole  40 mg Oral QAC breakfast  . temazepam  30 mg Oral QHS  . warfarin  5 mg Oral ONCE-1800  . Warfarin - Pharmacist Dosing Inpatient   Does not apply q1800   Continuous Infusions: . sodium chloride 100 mL/hr at 06/05/15 0333  . heparin 950 Units/hr (06/05/15 1005)   PRN Meds:.acetaminophen, ALPRAZolam Assessment/Plan:  Gastrointestinal Bleed: Gastric erosions seen on EGD.No BMs since yesterday. Hgb stable in 9's. Restarting anticoagulation. - CBC in AM - Protonix 40 mg daily - Step down unit - Heart Healthy Diet - NS 50 cc/hr  Mechanical Mitral Valve: Restarting anticoagulation. Target INR 3-4. Will need two therapeutic INRs before discharge. - Heparin bridge to warfarin. - INR in AM  Paroxysmal Atrial Fibrillation: Now in Afib with rates to low 100s. On rate control - Anticoagulation plan per above - Restarted Metoprolol 75 mg BID for rate control  Anxiety and Depression: Patient has intermittent episodes of agitation early in hospitalization, but it has been more controlled with family around.  - Bupropion 75 mg daily, escitalopram 20 mg daily - Alprazolam 1 mg daily prn in afternoons (12pm-4pm) - Temazepam 30 mg qhs  DVT Prophylaxis: SCDs, anticoagulation per above  Dispo: Disposition is deferred at this time, awaiting improvement of current medical problems.  Anticipated discharge in approximately 3-4 day(s).  The patient does have a current PCP Lorie Phenix, MD) and does not need an [facility] hospital follow-up appointment after discharge.  The patient does have transportation limitations that hinder transportation to clinic appointments.  .Services Needed at time of discharge: Y = Yes, Blank = No PT:   OT:   RN:   Equipment:   Other:     LOS: 2 days   Ruben Im, MD 06/05/2015, 11:56 AM

## 2015-06-05 NOTE — Progress Notes (Signed)
ANTICOAGULATION CONSULT NOTE - Initial Consult  Pharmacy Consult for warfarin/Hepairn Indication: atrial fibrillation and MVR  Allergies  Allergen Reactions  . Codeine Other (See Comments)  . Aspirin     unknown  . Penicillins     Has patient had a PCN reaction causing immediate rash, facial/tongue/throat swelling, SOB or lightheadedness with hypotension: NO Has patient had a PCN reaction causing severe rash involving mucus membranes or skin necrosis: NO Has patient had a PCN reaction that required hospitalization NO Has patient had a PCN reaction occurring within the last 10 years: NO If all of the above answers are "NO", then may proceed with Cephalosporin use.    Patient Measurements: Height:  (165.1 cm) Weight: 141 lb 12.8 oz (64.32 kg) IBW/kg (Calculated) : 57 Heparin Dosing Weight: 64 kg  Vital Signs: Temp: 99.7 F (37.6 C) (01/23 0735) Temp Source: Oral (01/23 0735) BP: 129/67 mmHg (01/23 0735) Pulse Rate: 105 (01/23 0735)  Labs:  Recent Labs  06/03/15 0959 06/03/15 1140  06/03/15 1912 06/04/15 0620  06/04/15 1406 06/04/15 1848 06/04/15 2334 06/05/15 0330  HGB 9.5* 9.3*  --  7.5* 10.7*  < >  --  9.7* 9.6* 9.9*  HCT 28.0* 28.2*  --  22.2* 31.3*  < >  --  27.9* 27.5* 28.7*  PLT 181 195  --  150 148*  < >  --  139* 133* 151  LABPROT >90.0*  --   < > 16.3* 15.5*  --  15.3*  --   --  17.5*  INR >10.00*  --   < > 1.30 1.21  --  1.20  --   --  1.42  CREATININE 0.68  --   --   --  0.49  --   --   --   --  0.57  TROPONINI  --  <0.03  --  <0.03  --   --   --   --   --   --   < > = values in this interval not displayed.  Estimated Creatinine Clearance: 54.7 mL/min (by C-G formula based on Cr of 0.57).   Medical History: Past Medical History  Diagnosis Date  . CAD (coronary artery disease) 08/2005    non-obstructive by cath  . Atrial fibrillation (HCC)     on chronic coumadin  . Depression   . Atrial flutter (HCC)     s/p RFA x 2  . Mitral regurgitation      S/P Bjork Shiley mechanical mitral valve replacement in 1980  . Gastroesophageal reflux disease   . TIA (transient ischemic attack)   . HTN (hypertension)   . Anxiety   . HLD (hyperlipidemia)   . Carotid stenosis     a. Carotid US (9/14):  Bilat 1-39% => f/u PRN    Medications:  Prescriptions prior to admission  Medication Sig Dispense Refill Last Dose  . ALPRAZolam (XANAX) 0.5 MG tablet TAKE 1/2 TO 1 TABLET BY MOUTH EVERY 6 HOURS AS NEEDED FOR ANXIETY 60 tablet 5 06/03/2015 at Unknown time  . Ascorbic Acid (VITAMIN C) 500 MG tablet Take 500 mg by mouth daily.     06/03/2015 at Unknown time  . aspirin EC 81 MG EC tablet Take 81 mg by mouth daily.     06/03/2015 at Unknown time  . atorvastatin (LIPITOR) 20 MG tablet TAKE 1 TABLET BY MOUTH ONCE A DAY 30 tablet 0 06/03/2015 at Unknown time  . buPROPion (WELLBUTRIN) 75 MG tablet TAKE 1 TABLET BY  MOUTH EVERY MORNING 90 tablet 1 06/03/2015 at Unknown time  . Cyanocobalamin (VITAMIN B 12) 250 MCG LOZG Take 500 mg by mouth daily.     06/03/2015 at Unknown time  . diphenhydramine-acetaminophen (TYLENOL PM EXTRA STRENGTH) 25-500 MG TABS tablet Take 1 tablet by mouth at bedtime.    06/02/2015 at Unknown time  . escitalopram (LEXAPRO) 20 MG tablet TAKE 1 TABLET BY MOUTH ONCE A DAY 90 tablet 3 06/02/2015 at Unknown time  . guaiFENesin (ROBITUSSIN) 100 MG/5ML liquid Take 200 mg by mouth 3 (three) times daily as needed for cough.   06/02/2015 at Unknown time  . lisinopril (PRINIVIL,ZESTRIL) 10 MG tablet TAKE 1 TABLET BY MOUTH ONCE A DAY 30 tablet 0 06/03/2015 at Unknown time  . metoprolol (LOPRESSOR) 50 MG tablet Take 75 mg by mouth 2 (two) times daily.   06/03/2015 at Unknown time  . Multiple Vitamins-Calcium (EQL ONE DAILY WOMENS PO) Take 1 tablet by mouth daily.     06/03/2015 at Unknown time  . temazepam (RESTORIL) 30 MG capsule TAKE 1 CAPSULE BY MOUTH EVERY NIGHT AT BEDTIME 30 capsule 5 06/02/2015 at Unknown time  . warfarin (COUMADIN) 5 MG tablet Take 2.5-5  mg by mouth See admin instructions. Take  every day except on sundays and thursdays take 2.5mg    06/02/2015 at Unknown time  . HYDROcodone-acetaminophen (NORCO) 5-325 MG per tablet Take 1 tablet by mouth every 6 (six) hours as needed for moderate pain. (Patient not taking: Reported on 03/27/2015) 60 tablet 0 Not Taking at Unknown time  . methocarbamol (ROBAXIN) 500 MG tablet Take 1 tablet (500 mg total) by mouth 4 (four) times daily. (Patient not taking: Reported on 03/27/2015) 60 tablet 0 Not Taking at Unknown time  . warfarin (COUMADIN) 5 MG tablet Take as directed by Coumadin Clinic (Patient not taking: Reported on 06/03/2015) 40 tablet 3 Not Taking at Unknown time    Assessment: 82 yof presented to the hospital with dark stool. INR found to be >10 so it was reversed with KCentra and vitamin K. No new bleeding noted. GI ok with resuming coumadin. Pt has a very high INR goal outpatient. INR 1.42, will also start heparin.   Coumadin PTA dose:  daily except for 2.5 mg on Thursday and Sundays  Goal of Therapy:  INR 3-4 Monitor platelets by anticoagulation protocol: Yes   Plan:  - Heparin infusion without bolus 950 units/hr - 8 hr heparin level at 1800 - Warfarin  PO x 1 tonight - Daily INR, heparin level and CBC  Bayard Hugger, PharmD, BCPS  Clinical Pharmacist  Pager: [phone]   06/05/2015,9:02 AM

## 2015-06-06 ENCOUNTER — Inpatient Hospital Stay (HOSPITAL_COMMUNITY): Payer: Medicare PPO

## 2015-06-06 ENCOUNTER — Encounter: Payer: Self-pay | Admitting: Internal Medicine

## 2015-06-06 DIAGNOSIS — D72829 Elevated white blood cell count, unspecified: Secondary | ICD-10-CM

## 2015-06-06 LAB — URINALYSIS, ROUTINE W REFLEX MICROSCOPIC
Bilirubin Urine: NEGATIVE
Glucose, UA: >1000 mg/dL — AB
KETONES UR: NEGATIVE mg/dL
Leukocytes, UA: NEGATIVE
Nitrite: NEGATIVE
PROTEIN: NEGATIVE mg/dL
SPECIFIC GRAVITY, URINE: 1.015 (ref 1.005–1.030)
pH: 7 (ref 5.0–8.0)

## 2015-06-06 LAB — BASIC METABOLIC PANEL
Anion gap: 6 (ref 5–15)
CHLORIDE: 107 mmol/L (ref 101–111)
CO2: 23 mmol/L (ref 22–32)
CREATININE: 0.57 mg/dL (ref 0.44–1.00)
Calcium: 8.3 mg/dL — ABNORMAL LOW (ref 8.9–10.3)
GFR calc Af Amer: >60 mL/min (ref >60)
GFR calc non Af Amer: >60 mL/min (ref >60)
GLUCOSE: 121 mg/dL — AB (ref 65–99)
Potassium: 3.3 mmol/L — ABNORMAL LOW (ref 3.5–5.1)
Sodium: 136 mmol/L (ref 135–145)

## 2015-06-06 LAB — CBC
HEMATOCRIT: 29.4 % — AB (ref 36.0–46.0)
HEMOGLOBIN: 9.9 g/dL — AB (ref 12.0–15.0)
MCH: 30.5 pg (ref 26.0–34.0)
MCHC: 33.7 g/dL (ref 30.0–36.0)
MCV: 90.5 fL (ref 78.0–100.0)
Platelets: 176 10*3/uL (ref 150–400)
RBC: 3.25 MIL/uL — ABNORMAL LOW (ref 3.87–5.11)
RDW: 16.7 % — ABNORMAL HIGH (ref 11.5–15.5)
WBC: 14.1 10*3/uL — ABNORMAL HIGH (ref 4.0–10.5)

## 2015-06-06 LAB — TROPONIN I: Troponin I: <0.03 ng/mL (ref <0.031)

## 2015-06-06 LAB — URINE MICROSCOPIC-ADD ON

## 2015-06-06 LAB — HEPARIN LEVEL (UNFRACTIONATED): Heparin Unfractionated: 0.58 IU/mL (ref 0.30–0.70)

## 2015-06-06 LAB — PROTIME-INR
INR: 2.5 — AB (ref 0.00–1.49)
Prothrombin Time: 26.7 seconds — ABNORMAL HIGH (ref 11.6–15.2)

## 2015-06-06 MED ORDER — METOPROLOL TARTRATE 100 MG PO TABS
100.0000 mg | ORAL_TABLET | Freq: Two times a day (BID) | ORAL | Status: DC
  Administered 2015-06-06 – 2015-06-09 (×6): 100 mg via ORAL
  Filled 2015-06-06 (×6): qty 1

## 2015-06-06 MED ORDER — METOPROLOL TARTRATE 25 MG PO TABS
25.0000 mg | ORAL_TABLET | Freq: Once | ORAL | Status: AC
  Administered 2015-06-06: 25 mg via ORAL
  Filled 2015-06-06: qty 1

## 2015-06-06 MED ORDER — POTASSIUM CHLORIDE CRYS ER 20 MEQ PO TBCR
40.0000 meq | EXTENDED_RELEASE_TABLET | Freq: Once | ORAL | Status: AC
  Administered 2015-06-06: 40 meq via ORAL
  Filled 2015-06-06: qty 2

## 2015-06-06 MED ORDER — CLINDAMYCIN HCL 300 MG PO CAPS
300.0000 mg | ORAL_CAPSULE | Freq: Four times a day (QID) | ORAL | Status: DC
  Filled 2015-06-06 (×3): qty 1

## 2015-06-06 MED ORDER — SODIUM CHLORIDE 0.9 % IV SOLN
1.0000 g | Freq: Three times a day (TID) | INTRAVENOUS | Status: DC
  Administered 2015-06-06 – 2015-06-08 (×6): 1 g via INTRAVENOUS
  Filled 2015-06-06 (×11): qty 1

## 2015-06-06 MED ORDER — DEXTROSE 5 % IV SOLN
2.0000 g | Freq: Two times a day (BID) | INTRAVENOUS | Status: DC
  Filled 2015-06-06 (×2): qty 2

## 2015-06-06 MED ORDER — DICLOFENAC SODIUM 1 % TD GEL
2.0000 g | Freq: Four times a day (QID) | TRANSDERMAL | Status: DC
  Administered 2015-06-06 – 2015-06-09 (×11): 2 g via TOPICAL
  Filled 2015-06-06: qty 100

## 2015-06-06 MED ORDER — METOPROLOL TARTRATE 1 MG/ML IV SOLN
5.0000 mg | Freq: Once | INTRAVENOUS | Status: AC
  Administered 2015-06-06: 5 mg via INTRAVENOUS
  Filled 2015-06-06: qty 5

## 2015-06-06 MED ORDER — WARFARIN SODIUM 4 MG PO TABS
4.0000 mg | ORAL_TABLET | Freq: Once | ORAL | Status: AC
  Administered 2015-06-06: 4 mg via ORAL
  Filled 2015-06-06: qty 1

## 2015-06-06 MED ORDER — PIPERACILLIN-TAZOBACTAM 3.375 G IVPB
3.3750 g | Freq: Three times a day (TID) | INTRAVENOUS | Status: DC
  Filled 2015-06-06 (×3): qty 50

## 2015-06-06 NOTE — Progress Notes (Signed)
Patient seen and examined. Case d/w residents in detail. I agree with findings and plan as documented in Dr. Marily Lente note.  Patient feels well. No further episodes of melena/hematochezia. Hg remains stable. No further GI w/u for now. Will c/w IV heparin till INR is therapeutic for mechanical valve and pAfib and monitor for recurrent bleed. C/w PPI  Patient also noted to have progressively increasing leukocytosis. No active clinical signs or symptoms of infection. Will check u/a and CXR. It is possible that this reactive to EGD but will need to rule out infectious etiology.  afib not rate controlled. Will increase metoprolol to 100 mg bid and monitor.  Case d/w patient and husband at bedside in detail.

## 2015-06-06 NOTE — Progress Notes (Signed)
ANTICOAGULATION CONSULT NOTE - Initial Consult  Pharmacy Consult for warfarin/Hepairn Indication: atrial fibrillation and MVR  Allergies  Allergen Reactions  . Codeine Other (See Comments)  . Aspirin     unknown  . Penicillins     Has patient had a PCN reaction causing immediate rash, facial/tongue/throat swelling, SOB or lightheadedness with hypotension: NO Has patient had a PCN reaction causing severe rash involving mucus membranes or skin necrosis: NO Has patient had a PCN reaction that required hospitalization NO Has patient had a PCN reaction occurring within the last 10 years: NO If all of the above answers are "NO", then may proceed with Cephalosporin use.    Patient Measurements: Height:  (165.1 cm) Weight: 143 lb 12.8 oz (65.227 kg) IBW/kg (Calculated) : 57 Heparin Dosing Weight: 64 kg  Vital Signs: Temp: 98.2 F (36.8 C) (01/24 0354) Temp Source: Oral (01/24 0354) BP: 133/71 mmHg (01/24 0354) Pulse Rate: 119 (01/24 0311)  Labs:  Recent Labs  06/03/15 1140  06/03/15 1912 06/04/15 0620  06/04/15 1406  06/04/15 2334 06/05/15 0330 06/05/15 1829 06/06/15 0227  HGB 9.3*  --  7.5* 10.7*  < >  --   < > 9.6* 9.9*  --  9.9*  HCT 28.2*  --  22.2* 31.3*  < >  --   < > 27.5* 28.7*  --  29.4*  PLT 195  --  150 148*  < >  --   < > 133* 151  --  176  LABPROT  --   < > 16.3* 15.5*  --  15.3*  --   --  17.5*  --  26.7*  INR  --   < > 1.30 1.21  --  1.20  --   --  1.42  --  2.50*  HEPARINUNFRC  --   --   --   --   --   --   --   --   --  0.30 0.58  CREATININE  --   --   --  0.49  --   --   --   --  0.57  --  0.57  TROPONINI <0.03  --  <0.03  --   --   --   --   --   --   --   --   < > = values in this interval not displayed.  Estimated Creatinine Clearance: 54.7 mL/min (by C-G formula based on Cr of 0.57).  Assessment: 74 yof on heparin bridge to coumadin post an episode of LGIB, and s/p Kcentra + vitamin K. INR 1.42 > 2.5, heparin level therapeutic 0.58 on 1000  units/hr. Hgb 9.9, plt 176 K, stable  Coumadin PTA dose:  daily except for 2.5 mg on Thursday and Sundays, admission INR > 10, her outpatient INR has been mostly therapeutic on this regimen since Aug 2016   Goal of Therapy:  INR 3-4 Monitor platelets by anticoagulation protocol: Yes   Plan:  - Continue heparin infusion 1000 units/hr - Warfarin 4 mg PO x 1 tonight - Daily INR, heparin level and CBC  Bayard Hugger, PharmD, BCPS  Clinical Pharmacist  Pager: [phone]   06/06/2015,10:30 AM

## 2015-06-06 NOTE — Progress Notes (Addendum)
Subjective:  Patient and her husband in the room deny that she has had any cough, but says she did have some lower abdominal discomfort that subsided with tylenol. She had one normal bowel movement yesterday. She continues to be confused and believed her pulse ox was a cigarette yesterday. Her husband spent the night and said she had slept a bit.    Objective: Vital signs in last 24 hours: Filed Vitals:   06/05/15 1919 06/06/15 0311 06/06/15 0344 06/06/15 0354  BP: 152/95 161/86  133/71  Pulse: 117 119    Temp: 97.7 F (36.5 C)   98.2 F (36.8 C)  TempSrc: Oral   Oral  Resp: 30 37    Height:      Weight:   143 lb 12.8 oz (65.227 kg)   SpO2: 100% 97%  97%   Weight change: 2 lb (0.907 kg)  Intake/Output Summary (Last 24 hours) at 06/06/15 1020 Last data filed at 06/06/15 0953  Gross per 24 hour  Intake    740 ml  Output   3475 ml  Net  -2735 ml   Physical Exam General: Lying in bed, NAD HEENT: Sclerae anicteric. Moist mucous membranes. No tonsillar exudates or erythema.  Cardiovascular: Tachycardic. Irregularly irregular rhythm with systolic murmur  Pulmonary: CTAB. Unlabored breathing Abdominal: No TTP. Normal bowel sounds Extremities: No clubbing, cyanosis, or edema Neurological: Intermittently confused. Tongue midline, face symmetric. Speech normal.  Lab Results: Basic Metabolic Panel:  Recent Labs Lab 06/05/15 0330 06/06/15 0227  NA 138 136  K 3.1* 3.3*  CL 110 107  CO2 21* 23  GLUCOSE 92 121*  BUN 7 <5*  CREATININE 0.57 0.57  CALCIUM 8.3* 8.3*   CBC:  Recent Labs Lab 06/05/15 0330 06/06/15 0227  WBC 11.3* 14.1*  HGB 9.9* 9.9*  HCT 28.7* 29.4*  MCV 88.9 90.5  PLT 151 176    Coagulation:  Recent Labs Lab 06/04/15 0620 06/04/15 1406 06/05/15 0330 06/06/15 0227  LABPROT 15.5* 15.3* 17.5* 26.7*  INR 1.21 1.20 1.42 2.50*   Studies/Results: No results found. Medications: I have reviewed the patient's current medications. Scheduled  Meds: . buPROPion  75 mg Oral q morning - 10a  . escitalopram  20 mg Oral Daily  . metoprolol  100 mg Oral BID  . nicotine  14 mg Transdermal Daily  . pantoprazole  40 mg Oral QAC breakfast  . temazepam  30 mg Oral QHS  . Warfarin - Pharmacist Dosing Inpatient   Does not apply q1800   Continuous Infusions: . sodium chloride 50 mL/hr at 06/05/15 1409  . heparin 1,000 Units/hr (06/05/15 1934)   PRN Meds:.acetaminophen, ALPRAZolam, ALPRAZolam Assessment/Plan:  Gastrointestinal Bleed: Gastric erosions seen on EGD. At least one normal BM  yesterday. Hgb stable in 9's. Currently being bridged to warfarin with heparin. - CBC in AM - Protonix 40 mg daily - Telemetry - Heart Healthy Diet  Mechanical Mitral Valve: Restarted anticoagulation. Target INR 3-4. INR today is 2.5 - Heparin bridge to warfarin. - INR in AM  Atrial Fibrillation with RVR: HRs are now more poorly controlled with rates sustained in the 120s. Had a single 13 beat run of Vtach last night. Could be driven by infection given leukocytosis. May consult cardiology if unable to control today.  - Anticoagulation plan per above - Increase Metoprolol 75 mg BID to 100 mg BID  Leukocytosis: Steadily rising WBC over past 3 days, to 14.1 today. Patient complained of lower abdominal pain, so UTI a consideration.  Could be related to recent bleed and EGD as well. - UA today - CBC in AM  Anxiety and Depression: Patient has intermittent episodes of agitation early in hospitalization, but it has been more controlled with family around.  - Bupropion 75 mg daily, escitalopram 20 mg daily - Alprazolam 0.5 mg daily in mornings (9am-11am); 1 mg daily prn in afternoons (12pm-4pm) - Temazepam 30 mg qhs  Tobacco Abuse: 14 mg nicotine patch  DVT Prophylaxis: SCDs, anticoagulation per above  Dispo: Anticipated discharge home with PT once INR therapeutic (3-4)  The patient does have a current PCP Lorie Phenix, MD) and does not need an [facility]  hospital follow-up appointment after discharge.  The patient does have transportation limitations that hinder transportation to clinic appointments.  .Services Needed at time of discharge: Y = Yes, Blank = No PT:   OT:   RN:   Equipment:   Other:     LOS: 3 days   Ruben Im, MD 06/06/2015, 10:20 AM

## 2015-06-06 NOTE — Progress Notes (Signed)
IMTS was called earlier in the evening as nursing stated the patient looked badly and was complaining of pain. Resident went to evaluate patient and she was complaining of shoulder pain which started the day prior as she thought she had slept on it wrong. She denied any chest pain. Patient also was tachypneic and tachycardic when evaluated before 7 pm. This was attributed to her HCAP and Afib with RVR. EKG, voltaren gel and troponin were ordered. Additional dose of metoprolol 5 mg IV was given and metoprolol was increased to 100 mg BID. We were asked to eyeball the patient later in the evening.   On evaluation, patient is sleeping comfortably after receiving her 30 mg of Restoril. Patient's respiratory rate was normal, not tachypneic, satting well on room air. Normotensive. She continues to be tachycardic in the 120s, but just received her metoprolol. Her family member is sitting next to her and states she was comfortable and without complaints before falling asleep.   Plan: -Continue to monitor heart rate after receiving metoprolol, may need additional IV metoprolol if HR >120s -Follow up troponin, EKG looks similar to prior -Voltaren gel for shoulder pain  Jill Alexanders, DO PGY-2 Internal Medicine Resident Pager # [phone] 06/06/2015 9:41 PM

## 2015-06-06 NOTE — Progress Notes (Signed)
ANTIBIOTIC CONSULT NOTE - INITIAL  Pharmacy Consult for Merrem Indication: pneumonia  Allergies  Allergen Reactions  . Codeine Other (See Comments)  . Aspirin     unknown  . Penicillins     Has patient had a PCN reaction causing immediate rash, facial/tongue/throat swelling, SOB or lightheadedness with hypotension: NO Has patient had a PCN reaction causing severe rash involving mucus membranes or skin necrosis: NO Has patient had a PCN reaction that required hospitalization NO Has patient had a PCN reaction occurring within the last 10 years: NO If all of the above answers are "NO", then may proceed with Cephalosporin use.    Patient Measurements: Height:  (165.1 cm) Weight: 143 lb 12.8 oz (65.227 kg) IBW/kg (Calculated) : 57 Adjusted Body Weight:   Vital Signs: Temp: 98.2 F (36.8 C) (01/24 0354) Temp Source: Oral (01/24 0354) BP: 133/71 mmHg (01/24 0354) Intake/Output from previous day: 01/23 0701 - 01/24 0700 In: 620 [P.O.:620] Out: 3050 [Urine:3050] Intake/Output from this shift: Total I/O In: 320 [P.O.:320] Out: 1025 [Urine:1025]  Labs:  Recent Labs  06/04/15 0620  06/04/15 2334 06/05/15 0330 06/06/15 0227  WBC 11.3*  < > 10.6* 11.3* 14.1*  HGB 10.7*  < > 9.6* 9.9* 9.9*  PLT 148*  < > 133* 151 176  CREATININE 0.49  --   --  0.57 0.57  < > = values in this interval not displayed. Estimated Creatinine Clearance: 54.7 mL/min (by C-G formula based on Cr of 0.57). No results for input(s): VANCOTROUGH, VANCOPEAK, VANCORANDOM, GENTTROUGH, GENTPEAK, GENTRANDOM, TOBRATROUGH, TOBRAPEAK, TOBRARND, AMIKACINPEAK, AMIKACINTROU, AMIKACIN in the last 72 hours.   Microbiology: Recent Results (from the past 720 hour(s))  MRSA PCR Screening     Status: None   Collection Time: 06/03/15  3:13 PM  Result Value Ref Range Status   MRSA by PCR NEGATIVE NEGATIVE Final    Comment:        The GeneXpert MRSA Assay (FDA approved for NASAL specimens only), is one component  of a comprehensive MRSA colonization surveillance program. It is not intended to diagnose MRSA infection nor to guide or monitor treatment for MRSA infections.     Medical History: Past Medical History  Diagnosis Date  . CAD (coronary artery disease) 08/2005    non-obstructive by cath  . Atrial fibrillation (HCC)     on chronic coumadin  . Depression   . Atrial flutter (HCC)     s/p RFA x 2  . Mitral regurgitation     S/P Bjork Shiley mechanical mitral valve replacement in 1980  . Gastroesophageal reflux disease   . TIA (transient ischemic attack)   . HTN (hypertension)   . Anxiety   . HLD (hyperlipidemia)   . Carotid stenosis     a. Carotid US (9/14):  Bilat 1-39% => f/u PRN    Medications:  Prescriptions prior to admission  Medication Sig Dispense Refill Last Dose  . ALPRAZolam (XANAX) 0.5 MG tablet TAKE 1/2 TO 1 TABLET BY MOUTH EVERY 6 HOURS AS NEEDED FOR ANXIETY 60 tablet 5 06/03/2015 at Unknown time  . Ascorbic Acid (VITAMIN C) 500 MG tablet Take 500 mg by mouth daily.     06/03/2015 at Unknown time  . aspirin EC 81 MG EC tablet Take 81 mg by mouth daily.     06/03/2015 at Unknown time  . atorvastatin (LIPITOR) 20 MG tablet TAKE 1 TABLET BY MOUTH ONCE A DAY 30 tablet 0 06/03/2015 at Unknown time  . buPROPion (WELLBUTRIN) 75  MG tablet TAKE 1 TABLET BY MOUTH EVERY MORNING 90 tablet 1 06/03/2015 at Unknown time  . Cyanocobalamin (VITAMIN B 12) 250 MCG LOZG Take 500 mg by mouth daily.     06/03/2015 at Unknown time  . diphenhydramine-acetaminophen (TYLENOL PM EXTRA STRENGTH) 25-500 MG TABS tablet Take 1 tablet by mouth at bedtime.    06/02/2015 at Unknown time  . escitalopram (LEXAPRO) 20 MG tablet TAKE 1 TABLET BY MOUTH ONCE A DAY 90 tablet 3 06/02/2015 at Unknown time  . guaiFENesin (ROBITUSSIN) 100 MG/5ML liquid Take 200 mg by mouth 3 (three) times daily as needed for cough.   06/02/2015 at Unknown time  . lisinopril (PRINIVIL,ZESTRIL) 10 MG tablet TAKE 1 TABLET BY MOUTH ONCE A  DAY 30 tablet 0 06/03/2015 at Unknown time  . metoprolol (LOPRESSOR) 50 MG tablet Take 75 mg by mouth 2 (two) times daily.   06/03/2015 at Unknown time  . Multiple Vitamins-Calcium (EQL ONE DAILY WOMENS PO) Take 1 tablet by mouth daily.     06/03/2015 at Unknown time  . temazepam (RESTORIL) 30 MG capsule TAKE 1 CAPSULE BY MOUTH EVERY NIGHT AT BEDTIME 30 capsule 5 06/02/2015 at Unknown time  . warfarin (COUMADIN) 5 MG tablet Take 2.5-5 mg by mouth See admin instructions. Take  every day except on sundays and thursdays take 2.5mg    06/02/2015 at Unknown time  . HYDROcodone-acetaminophen (NORCO) 5-325 MG per tablet Take 1 tablet by mouth every 6 (six) hours as needed for moderate pain. (Patient not taking: Reported on 03/27/2015) 60 tablet 0 Not Taking at Unknown time  . methocarbamol (ROBAXIN) 500 MG tablet Take 1 tablet (500 mg total) by mouth 4 (four) times daily. (Patient not taking: Reported on 03/27/2015) 60 tablet 0 Not Taking at Unknown time  . warfarin (COUMADIN) 5 MG tablet Take as directed by Coumadin Clinic (Patient not taking: Reported on 06/03/2015) 40 tablet 3 Not Taking at Unknown time   Scheduled:  . buPROPion  75 mg Oral q morning - 10a  . escitalopram  20 mg Oral Daily  . meropenem (MERREM) IV  1 g Intravenous Q8H  . metoprolol  100 mg Oral BID  . nicotine  14 mg Transdermal Daily  . pantoprazole  40 mg Oral QAC breakfast  . temazepam  30 mg Oral QHS  . warfarin  4 mg Oral ONCE-1800  . Warfarin - Pharmacist Dosing Inpatient   Does not apply q1800   Infusions:  . heparin 1,000 Units/hr (06/05/15 1934)   Assessment: 74yo female to start merrem for possible HCAP vs aspiration PNA.   Goal of Therapy:  Eradication of infection  Plan:  Merrem 1g IV q8h  Expected duration 7 days with resolution of temperature and/or normalization of WBC Follow up culture results, renal function and clinical course  Arlean Hopping. Newman Pies, PharmD, BCPS Clinical Pharmacist Pager  [phone] 06/06/2015,3:38 PM

## 2015-06-06 NOTE — Progress Notes (Signed)
Quick Note:  Inactive gastritis No recall Letter created/sent ______

## 2015-06-07 ENCOUNTER — Encounter (HOSPITAL_COMMUNITY): Payer: Self-pay | Admitting: Physician Assistant

## 2015-06-07 DIAGNOSIS — E876 Hypokalemia: Secondary | ICD-10-CM | POA: Diagnosis present

## 2015-06-07 DIAGNOSIS — I484 Atypical atrial flutter: Secondary | ICD-10-CM

## 2015-06-07 DIAGNOSIS — I2510 Atherosclerotic heart disease of native coronary artery without angina pectoris: Secondary | ICD-10-CM | POA: Diagnosis present

## 2015-06-07 DIAGNOSIS — I10 Essential (primary) hypertension: Secondary | ICD-10-CM

## 2015-06-07 DIAGNOSIS — D62 Acute posthemorrhagic anemia: Secondary | ICD-10-CM | POA: Diagnosis present

## 2015-06-07 DIAGNOSIS — J189 Pneumonia, unspecified organism: Secondary | ICD-10-CM

## 2015-06-07 DIAGNOSIS — F17200 Nicotine dependence, unspecified, uncomplicated: Secondary | ICD-10-CM

## 2015-06-07 DIAGNOSIS — I4892 Unspecified atrial flutter: Secondary | ICD-10-CM | POA: Diagnosis present

## 2015-06-07 LAB — TYPE AND SCREEN
ABO/RH(D): O NEG
Antibody Screen: NEGATIVE
Unit division: 0
Unit division: 0
Unit division: 0
Unit division: 0

## 2015-06-07 LAB — PROTIME-INR
INR: 3.14 — ABNORMAL HIGH (ref 0.00–1.49)
Prothrombin Time: 31.7 seconds — ABNORMAL HIGH (ref 11.6–15.2)

## 2015-06-07 LAB — HEPARIN LEVEL (UNFRACTIONATED): Heparin Unfractionated: 0.51 IU/mL (ref 0.30–0.70)

## 2015-06-07 LAB — BASIC METABOLIC PANEL
ANION GAP: 14 (ref 5–15)
BUN: <5 mg/dL — ABNORMAL LOW (ref 6–20)
CO2: 22 mmol/L (ref 22–32)
Calcium: 8.1 mg/dL — ABNORMAL LOW (ref 8.9–10.3)
Chloride: 99 mmol/L — ABNORMAL LOW (ref 101–111)
Creatinine, Ser: 0.53 mg/dL (ref 0.44–1.00)
GFR calc Af Amer: >60 mL/min (ref >60)
GLUCOSE: 129 mg/dL — AB (ref 65–99)
POTASSIUM: 3.2 mmol/L — AB (ref 3.5–5.1)
Sodium: 135 mmol/L (ref 135–145)

## 2015-06-07 LAB — T4, FREE: FREE T4: 1.31 ng/dL — AB (ref 0.61–1.12)

## 2015-06-07 LAB — CBC
HEMATOCRIT: 29.6 % — AB (ref 36.0–46.0)
Hemoglobin: 9.9 g/dL — ABNORMAL LOW (ref 12.0–15.0)
MCH: 30.1 pg (ref 26.0–34.0)
MCHC: 33.4 g/dL (ref 30.0–36.0)
MCV: 90 fL (ref 78.0–100.0)
PLATELETS: 197 10*3/uL (ref 150–400)
RBC: 3.29 MIL/uL — AB (ref 3.87–5.11)
RDW: 16.6 % — ABNORMAL HIGH (ref 11.5–15.5)
WBC: 12.2 10*3/uL — AB (ref 4.0–10.5)

## 2015-06-07 LAB — MAGNESIUM: Magnesium: 1.7 mg/dL (ref 1.7–2.4)

## 2015-06-07 LAB — TSH: TSH: 1.984 u[IU]/mL (ref 0.350–4.500)

## 2015-06-07 MED ORDER — WARFARIN SODIUM 4 MG PO TABS
4.0000 mg | ORAL_TABLET | Freq: Once | ORAL | Status: AC
  Administered 2015-06-07: 4 mg via ORAL
  Filled 2015-06-07: qty 1

## 2015-06-07 MED ORDER — DILTIAZEM HCL 30 MG PO TABS
30.0000 mg | ORAL_TABLET | Freq: Three times a day (TID) | ORAL | Status: DC
  Administered 2015-06-07 (×2): 30 mg via ORAL
  Filled 2015-06-07 (×2): qty 1

## 2015-06-07 MED ORDER — POTASSIUM CHLORIDE CRYS ER 20 MEQ PO TBCR
40.0000 meq | EXTENDED_RELEASE_TABLET | Freq: Once | ORAL | Status: AC
  Administered 2015-06-07: 40 meq via ORAL
  Filled 2015-06-07: qty 2

## 2015-06-07 MED ORDER — MAGNESIUM SULFATE 2 GM/50ML IV SOLN
2.0000 g | Freq: Once | INTRAVENOUS | Status: AC
  Administered 2015-06-07: 2 g via INTRAVENOUS
  Filled 2015-06-07: qty 50

## 2015-06-07 MED ORDER — DILTIAZEM HCL 60 MG PO TABS: 60.0000 mg | ORAL_TABLET | Freq: Three times a day (TID) | ORAL | Status: DC

## 2015-06-07 MED ORDER — DILTIAZEM HCL 60 MG PO TABS
60.0000 mg | ORAL_TABLET | Freq: Three times a day (TID) | ORAL | Status: DC
  Administered 2015-06-07 (×2): 60 mg via ORAL
  Filled 2015-06-07 (×3): qty 1

## 2015-06-07 MED ORDER — POTASSIUM CHLORIDE CRYS ER 20 MEQ PO TBCR
40.0000 meq | EXTENDED_RELEASE_TABLET | Freq: Every day | ORAL | Status: DC
  Administered 2015-06-08 – 2015-06-09 (×2): 40 meq via ORAL
  Filled 2015-06-07 (×2): qty 2

## 2015-06-07 NOTE — Plan of Care (Signed)
Problem: Phase I Progression Outcomes Goal: Anginal pain relieved Outcome: Completed/Met Date Met:  06/07/15 Pt denies any chest pain.

## 2015-06-07 NOTE — Progress Notes (Signed)
Subjective:  Patient and her husband in the room deny any chest pain, shoulder pain, dyspnea, leg swelling, or bleeding episodes. Last stool was at 3pm yesterday and normal.   The night team saw the patient last night and was noted to have persistent HR in the 120s. A repeat EKG and troponin were obtained.  Objective: Vital signs in last 24 hours: Filed Vitals:   06/06/15 2100 06/06/15 2307 06/07/15 0302 06/07/15 0733  BP: 140/82 155/92    Pulse: 121 123    Temp: 99.4 F (37.4 C) 99 F (37.2 C) 99.2 F (37.3 C) 98.4 F (36.9 C)  TempSrc: Oral Oral Oral Oral  Resp: 32     Height:      Weight:   141 lb 1.6 oz (64.003 kg)   SpO2: 95% 94% 96%    Weight change: -2 lb 11.2 oz (-1.225 kg)  Intake/Output Summary (Last 24 hours) at 06/07/15 0753 Last data filed at 06/07/15 0720  Gross per 24 hour  Intake    440 ml  Output   1925 ml  Net  -1485 ml   Physical Exam General: Lying in bed, NAD HEENT: Sclerae anicteric. Moist mucous membranes. No tonsillar exudates or erythema.  Cardiovascular: Loud and can be auscultated from her back. Tachycardic. Irregularly irregular rhythm with systolic murmur  Pulmonary: Egophony and dullness to percussion in right lung base. Unlabored breathing Abdominal: No TTP. Normal bowel sounds Extremities: No clubbing, cyanosis, or edema Neurological: Intermittently confused. Tongue midline, face symmetric. Speech normal.  Lab Results: Basic Metabolic Panel:  Recent Labs Lab 06/06/15 0227 06/07/15 0413  NA 136 135  K 3.3* 3.2*  CL 107 99*  CO2 23 22  GLUCOSE 121* 129*  BUN <5* <5*  CREATININE 0.57 0.53  CALCIUM 8.3* 8.1*   CBC:  Recent Labs Lab 06/06/15 0227 06/07/15 0413  WBC 14.1* 12.2*  HGB 9.9* 9.9*  HCT 29.4* 29.6*  MCV 90.5 90.0  PLT 176 197    Coagulation:  Recent Labs Lab 06/04/15 1406 06/05/15 0330 06/06/15 0227 06/07/15 0413  LABPROT 15.3* 17.5* 26.7* 31.7*  INR 1.20 1.42 2.50* 3.14*   Studies/Results: Dg  Chest Port 1 View  06/06/2015  CLINICAL DATA:  Leukocytosis. EXAM: PORTABLE CHEST 1 VIEW COMPARISON:  06/03/2015. FINDINGS: The cardiac silhouette remains mildly enlarged. The lungs remain hyperexpanded. Interval patchy opacity at the right lung base and small right pleural effusion. Interval mild linear density at the left lung base. The interstitial markings remain mildly prominent stable mild diffuse peribronchial thickening. Unremarkable bones. IMPRESSION: 1. Interval probable pneumonia at the right lung base with a small right pleural effusion. 2. Minimal left basilar atelectasis. 3. Stable cardiomegaly and changes of COPD and chronic bronchitis. Electronically Signed   By: Beckie Salts M.D.   On: 06/06/2015 13:57   Medications: I have reviewed the patient's current medications. Scheduled Meds: . buPROPion  75 mg Oral q morning - 10a  . diclofenac sodium  2 g Topical QID  . diltiazem  30 mg Oral TID AC & HS  . escitalopram  20 mg Oral Daily  . meropenem (MERREM) IV  1 g Intravenous Q8H  . metoprolol  100 mg Oral BID  . nicotine  14 mg Transdermal Daily  . pantoprazole  40 mg Oral QAC breakfast  . potassium chloride  40 mEq Oral Once  . temazepam  30 mg Oral QHS  . Warfarin - Pharmacist Dosing Inpatient   Does not apply q1800   Continuous Infusions: .  heparin 1,000 Units/hr (06/05/15 1934)   PRN Meds:.acetaminophen, ALPRAZolam, ALPRAZolam Assessment/Plan:  Gastrointestinal Bleed: Gastric erosions seen on EGD. At least one normal BM  yesterday. Hgb stable in 9's. Currently being bridged to warfarin with heparin. - CBC in AM - Protonix 40 mg daily - Telemetry - Heart Healthy Diet  Hospital Acquired versus Aspiration Pneumonia: Patient noted to have developing pneumonia in RLL, which could be HAP or aspiration associated with EGD. WBC improving from 14.1 to 12.2 today. She has remained afebrile and satting well on room air. Patient has a penicillin allergy on file, but neither the  husband, patient, nor anyone in her family can recall and adverse reaction to an antibiotic. They cannot recall her ever having a severe reaction (e.g., anaphylaxis). - Meropenem IV, will transition to clindamycin on discharge for a total 10 day course (final dose 2/2) - CBC in AM  Atrial Fibrillation with RVR: HRs remain poorly controlled with rates sustained in the 120s. Could be driven by infection given leukocytosis. May consult cardiology if unable to control today. Repeat EKG and troponin are reassuring today. - Anticoagulation plan per above - Metoprolol 100 mg BID - Started Diltiazem 30 mg TID - Cardiology consult  Mechanical Mitral Valve: Restarted anticoagulation. Target INR 3-4. INR today is therapeutic at 3.14 today. - Heparin bridge to warfarin, will d/c heparin if remains therapeutic for 24 hours - INR in AM  Anxiety and Depression: Patient has intermittent episodes of agitation early in hospitalization, but it has been more controlled with family around.  - Bupropion 75 mg daily, escitalopram 20 mg daily - Alprazolam 0.5 mg daily in mornings (9am-11am); 1 mg daily prn in afternoons (12pm-4pm) - Temazepam 30 mg qhs  Tobacco Abuse: 14 mg nicotine patch  DVT Prophylaxis: SCDs, anticoagulation per above  Dispo: Anticipated discharge home with PT once INR therapeutic (3-4) for 24 hours. tachycardia resolves, and pneumonia shows continued signs of improvement. Anticipated discharge in 1-3 days.  The patient does have a current PCP Lorie Phenix, MD) and does not need an [facility] hospital follow-up appointment after discharge.  The patient does have transportation limitations that hinder transportation to clinic appointments.  .Services Needed at time of discharge: Y = Yes, Blank = No PT: Y  OT:   RN:   Equipment:   Other:     LOS: 4 days   Ruben Im, MD 06/07/2015, 7:53 AM

## 2015-06-07 NOTE — Consult Note (Signed)
Cardiology Consultation Note    Patient ID: [name] [name], MRN: [id number], DOB/AGE: 1941-04-26 75 y.o. Admit date: 06/03/2015   Date of Consult: 06/07/2015 Primary Physician: Lorie Phenix, MD Primary Cardiologist: Dr. Shirlee Latch  Chief Complaint: black stools Reason for Consultation: atrial flutter Requesting MD: Dr. Heide Spark  HPI: [name] [name] is a 75 y/o female with history of mitral valve regurgitation s/p mitral valve replacement (with Bjork-Shiley tilting disc valve in 1980), PAF (on chronic warfarin for both), atrial flutter s/p atrial flutter ablation, TIAs, carotid stenosis (2014: 1-39% BICA), nonobstructive CAD by 2007, tobacco abuse, GERD, depression, anxiety, and dementia. History taking is limited by her dementia and is therefore obtained with the help of her husband. She presented to [facility]/03/2015 with weakness, SOB, nausea, sweating, and black stools. She had also had some vague chest/epigastric pain and left arm pain so her family brought her to the ER. She was found to have INR of >10 (prev 2.9 on 05/17/15) and Hgb of 9.5 (falling further to 7.5 during her stay) with prior Hgb of 15.9. Her husband reports she has a chronically poor appetite but hasn't had any change in oral intake lately. She has had an unintentional reported weight loss of about 10-15lbs. During this admission she received blood transfusion, FFP as well as vitamin K for reversal of her coagulopathy (INR got down to 1.2). She underwent EGD 06/04/15 showing several pre-pyloric erosions which were felt likely the source of her melena in setting of supratherapeutic INR. GI cleared her to resume warfarin with close f/u INR and continue PPI. She is also being treated with antibiotics for likely aspiration PNA in RLL. Troponins negative. K persistently low, being repleted by IM.  We are consulted for persistent tachycardia. Per EKGs and telemetry it appears she has been in atrial flutter since admission. (EKGs  are erroneously read as sinus tach.) There was a brief period of time on 06/05/15 when she deviated from 2:1 conduction and into variable block into the 80s-90s, but for the most part she has been in 2:1 with a HR in the 110-120 range. Her metoprolol was increased from  BID to  BID. She has been placed on oral diltiazem  QID earlier today but HR remains right around 120. As of right now she is completely asymptomatic from a cardiac standpoint - no CP, SOB, palpitations, sensation of racing heart, diaphoresis, or syncope. No further evidence of GIB.   Past Medical History  Diagnosis Date  . Non-occlusive coronary artery disease 08/2005    a. non-obstructive by cath 2007.  [name] PAF (paroxysmal atrial fibrillation) (HCC)     on chronic coumadin  . Depression   . Paroxysmal atrial flutter (HCC)     s/p RFA x 2  . Mitral regurgitation     a. s/p Bjork Shiley mechanical mitral valve replacement in 1980.  [name] Gastroesophageal reflux disease   . TIA (transient ischemic attack)   . HTN (hypertension)   . Anxiety   . HLD (hyperlipidemia)   . Carotid stenosis     a. Carotid US (9/14):  Bilat 1-39% => f/u PRN  . Dementia   . Tobacco abuse       Surgical History:  Past Surgical History  Procedure Laterality Date  . A flutter ablation      x 2  . Mitral valve replacement      Bjork-Shiley valve placed for mitral regurgitation  . Hip arthroplasty Left 05/07/2014    Procedure: ARTHROPLASTY  MONOPOLAR HIP;  Surgeon: Eldred Manges, MD;  Location: [facility] OR;  Service: Orthopedics;  Laterality: Left;  . Esophagogastroduodenoscopy N/A 06/04/2015    Procedure: ESOPHAGOGASTRODUODENOSCOPY (EGD);  Surgeon: Iva Boop, MD;  Location[facility] ENDOSCOPY;  Service: Endoscopy;  Laterality: N/A;     Home Meds: Prior to Admission medications   Medication Sig Start Date End Date Taking? Authorizing Provider  ALPRAZolam Prudy Feeler) 0.5 MG tablet TAKE 1/2 TO 1 TABLET BY MOUTH EVERY 6 HOURS AS NEEDED FOR ANXIETY  05/29/15  Yes Lorie Phenix, MD  Ascorbic Acid (VITAMIN C) 500 MG tablet Take 500 mg by mouth daily.     Yes Historical Provider, MD  aspirin EC 81 MG EC tablet Take 81 mg by mouth daily.     Yes Historical Provider, MD  atorvastatin (LIPITOR) 20 MG tablet TAKE 1 TABLET BY MOUTH ONCE A DAY 05/22/15  Yes Laurey Morale, MD  buPROPion [facility]) 75 MG tablet TAKE 1 TABLET BY MOUTH EVERY MORNING 04/19/15  Yes Lorie Phenix, MD  Cyanocobalamin (VITAMIN B 12) 250 MCG LOZG Take 500 mg by mouth daily.     Yes Historical Provider, MD  diphenhydramine-acetaminophen (TYLENOL PM EXTRA STRENGTH) 25-500 MG TABS tablet Take 1 tablet by mouth at bedtime.  01/07/11  Yes Historical Provider, MD  escitalopram (LEXAPRO) 20 MG tablet TAKE 1 TABLET BY MOUTH ONCE A DAY 12/12/14  Yes Lorie Phenix, MD  guaiFENesin (ROBITUSSIN) 100 MG/5ML liquid Take 200 mg by mouth 3 (three) times daily as needed for cough.   Yes Historical Provider, MD  lisinopril (PRINIVIL,ZESTRIL) 10 MG tablet TAKE 1 TABLET BY MOUTH ONCE A DAY 05/22/15  Yes Laurey Morale, MD  metoprolol (LOPRESSOR) 50 MG tablet Take 75 mg by mouth 2 (two) times daily.   Yes Historical Provider, MD  Multiple Vitamins-Calcium (EQL ONE DAILY WOMENS PO) Take 1 tablet by mouth daily.     Yes Historical Provider, MD  temazepam (RESTORIL) 30 MG capsule TAKE 1 CAPSULE BY MOUTH EVERY NIGHT AT BEDTIME 01/02/15  Yes Lorie Phenix, MD  warfarin (COUMADIN) 5 MG tablet Take 2.5-5 mg by mouth See admin instructions. Take 5mg  every day except on sundays and thursdays take 2.5mg    Yes Historical Provider, MD  HYDROcodone-acetaminophen (NORCO) 5-325 MG per tablet Take 1 tablet by mouth every 6 (six) hours as needed for moderate pain. Patient not taking: Reported on 03/27/2015 05/09/14   Naida Sleight, PA-C  methocarbamol (ROBAXIN) 500 MG tablet Take 1 tablet (500 mg total) by mouth 4 (four) times daily. Patient not taking: Reported on 03/27/2015 05/09/14   Naida Sleight, PA-C  warfarin  (COUMADIN) 5 MG tablet Take as directed by [facility] Patient not taking: Reported on 06/03/2015 12/27/14   Laurey Morale, MD    Inpatient Medications:  . buPROPion  75 mg Oral q morning - 10a  . diclofenac sodium  2 g Topical QID  . diltiazem  60 mg Oral TID AC & HS  . escitalopram  20 mg Oral Daily  . meropenem (MERREM) IV  1 g Intravenous Q8H  . metoprolol  100 mg Oral BID  . nicotine  14 mg Transdermal Daily  . pantoprazole  40 mg Oral QAC breakfast  . temazepam  30 mg Oral QHS  . warfarin  4 mg Oral ONCE-1800  . Warfarin - Pharmacist Dosing Inpatient   Does not apply q1800      Allergies:  Allergies  Allergen Reactions  . Codeine Other (See Comments)  .  Aspirin     unknown  . Penicillins     Has patient had a PCN reaction causing immediate rash, facial/tongue/throat swelling, SOB or lightheadedness with hypotension: NO Has patient had a PCN reaction causing severe rash involving mucus membranes or skin necrosis: NO Has patient had a PCN reaction that required hospitalization NO Has patient had a PCN reaction occurring within the last 10 years: NO If all of the above answers are "NO", then may proceed with Cephalosporin use.    Social History   Social History  . Marital Status: Married    Spouse Name: N/A  . Number of Children: N/A  . Years of Education: N/A   Occupational History  . Not on file.   Social History Main Topics  . Smoking status: Current Every Day Smoker -- 1.00 packs/day for 35 years    Types: Cigarettes  . Smokeless tobacco: Current User  . Alcohol Use: No  . Drug Use: No  . Sexual Activity: Not on file   Other Topics Concern  . Not on file   Social History Narrative     Family History  Problem Relation Age of Onset  . Heart attack Other      Review of Systems: All other systems reviewed and are otherwise negative except as noted above.  Labs:  Recent Labs  06/06/15 1959  TROPONINI <0.03   Lab Results  Component Value  Date   WBC 12.2* 06/07/2015   HGB 9.9* 06/07/2015   HCT 29.6* 06/07/2015   MCV 90.0 06/07/2015   PLT 197 06/07/2015    Recent Labs Lab 06/07/15 0413  NA 135  K 3.2*  CL 99*  CO2 22  BUN <5*  CREATININE 0.53  CALCIUM 8.1*  GLUCOSE 129*   Lab Results  Component Value Date   CHOL 157 03/27/2015   HDL 48 03/27/2015   LDLCALC 90 03/27/2015   TRIG 94 03/27/2015   No results found for: DDIMER  Radiology/Studies:  Dg Chest Port 1 View  06/06/2015  CLINICAL DATA:  Leukocytosis. EXAM: PORTABLE CHEST 1 VIEW COMPARISON:  06/03/2015. FINDINGS: The cardiac silhouette remains mildly enlarged. The lungs remain hyperexpanded. Interval patchy opacity at the right lung base and small right pleural effusion. Interval mild linear density at the left lung base. The interstitial markings remain mildly prominent stable mild diffuse peribronchial thickening. Unremarkable bones. IMPRESSION: 1. Interval probable pneumonia at the right lung base with a small right pleural effusion. 2. Minimal left basilar atelectasis. 3. Stable cardiomegaly and changes of COPD and chronic bronchitis. Electronically Signed   By: Beckie Salts M.D.   On: 06/06/2015 13:57   Dg Chest Port 1 View  06/03/2015  CLINICAL DATA:  75 year old female with chest pain earlier this morning and confusion currently. EXAM: PORTABLE CHEST 1 VIEW COMPARISON:  Prior chest x-ray 04/17/2009 FINDINGS: Stable cardiac and mediastinal contours. Mechanical mitral valve. Surgical clips present in the anterior mediastinum. Atherosclerotic calcifications again noted in the transverse aorta. Stable hyperinflation and bronchitic changes, particularly in the right middle lobe. There may be mild bronchiectasis. No focal infiltrate, pleural effusion, pulmonary edema or pneumothorax. Trace biapical pleural parenchymal scarring. No suspicious nodule or mass. No acute osseous abnormality. IMPRESSION: 1. No acute cardiopulmonary process. 2. Hyperinflation and central  bronchitic change suggest underlying COPD. 3. Bronchitic change with possible mild bronchiectasis in the right middle lobe. Electronically Signed   By: Malachy Moan M.D.   On: 06/03/2015 11:05    Wt Readings from Last 3 Encounters:  06/07/15 141 lb 1.6 oz (64.003 kg)  03/27/15 142 lb (64.411 kg)  05/06/14 137 lb 3.2 oz (62.234 kg)    EKG: multiple tracings reviewed: all are felt to reflect atrial flutter with 2:1 conduction, nonspecific St-T changes  Physical Exam: Blood pressure 123/78, pulse 123, temperature 98.9 F (37.2 C), temperature source Oral, resp. rate 32, height  (1.651 m), weight 141 lb 1.6 oz (64.003 kg), SpO2 98 %. Body mass index is 23.48 kg/(m^2). General: Well developed, well nourished WF in no acute distress. Head: Normocephalic, atraumatic, sclera non-icteric, no xanthomas, nares are without discharge.  Neck: JVD not elevated. Lungs: Clear bilaterally to auscultation without wheezes, rales, or rhonchi. Breathing is unlabored. Heart: Reg rhythm, tachycardic, with S1 S2. No murmurs, rubs, or gallops appreciated. Abdomen: Soft, non-tender, non-distended with normoactive bowel sounds. No hepatomegaly. No rebound/guarding. No obvious abdominal masses. Msk:  Strength and tone appear normal for age. Extremities: No clubbing or cyanosis. No edema.  Distal pedal pulses are 2+ and equal bilaterally. Neuro: Alert and oriented to place - does not known date, knows her full maiden name but not married name. No facial asymmetry. No focal deficit. Moves all extremities spontaneously. Follows commands. Psych: Pleasant affect.     Assessment and Plan   1. Upper GI bleed with ABL anemia - due to prepyloric erosions in the setting of supratherapeutic INR. Will need close INR f/u at discharge.  2. Hospital acquired versus aspiration PNA - per IM.  3. Persistent atrial flutter (with prior history of paroxysmal atrial fib/flutter) - review of EKGs shows she has been in atrial  flutter since admission. Traditionally atrial flutter can be difficult to rate control as evidenced by persistent HR in the 110-120s range. However, there was a brief period of time on 1/23 when she broke out of 2:1 into variable block with HR in the 80s-90s. We are hopeful that with further titration of diltiazem to  QID that this will be possible. Continue metoprolol  BID. We cannot proceed with DCCV at present time due to the reversal of coagulopathy and subtherapeutic INR during admission - would need a TEE first. With recent GIB, we hope to avoid TEE/DCCV in case further interventions were needed which would disrupt the 4-week timeframe of necessary uninterrupted anticoagulation. Given normal renal function we can try digoxin if diltiazem does not decrease her rate. Hopefully further treatment of her PNA and hypokalemia will help as well. Will also check thyroid function. Updating her echo would be helpful when HR is slower.  4. 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  5. Nonobstructive CAD by cath 2007 - troponins negative this admission. Chest/epigastric pain may have been GI in nature on arrival. No recent convincing angina. Anticipate continued surveillance for symptoms.  6. Hypokalemia - it appears from I/O's that patient may have been autodiuresing after blood transfusions. Despite daily her K has consistently run 3.1-3.2. Will give another now and start daily in AM. Check Mg and replete PRN.  Signed, Laurann Montana PA-C 06/07/2015, 2:07 PM Pager: [phone]  I have seen, examined and evaluated the patient this PM along with Mrs. Dunn,PA-c.  After reviewing all the available data and chart,  we discussed potential treatment options and recommendations. I agree with her findings, examination as well as impression recommendations.  She was admitted with what looks like a GI bleed  in the setting of extremely  supratherapeutic INR. She seems like she has been in 2-1 atrial flutter for at least a couple days now. Rate is somewhat difficult to control as he usually case for flutter. She does have some intermittent episodes of atrial fibrillation. Unfortunately after being given vitamin K, her INR actually went subtherapeutic for couple days. This makes simply cardioverting her not an option. Also with her recent bleed, guaranteeing that we will be fine with full and regulation for another 4 weeks post cardioversion is also difficult. He is not hemodynamically unstable, therefore I would hold off on cardioversion.   Plan is to increase her Lisette Grinder channel blocker dose along with beta blocker. We could use digoxin if necessary.  With an arrhythmia, I do agree with repleting her potassium levels.  We'll continue to monitor.   Thank you for the consultation.    Marykay Lex, M.D., M.S. Interventional Cardiologist   Pager # [phone] Phone # [phone] [address] [place], [place] [address]

## 2015-06-07 NOTE — Progress Notes (Signed)
PT Cancellation Note  Patient Details Name: [name] [name] MRN: [id number] DOB: 04-04-1941   Cancelled Treatment:     Pt resting in bed with HR at 123 and nurse student reports it has been like that all day and they are trying to get it to reduce.  Pt  With fatigue.  Will hold off on exercise and ambulation for today   Bayard Hugger. Manson Passey, PT  06/07/2015, 2:14 PM

## 2015-06-07 NOTE — Care Management Important Message (Signed)
Important Message  Patient Details  Name: [name] [name] MRN: [id number] Date of Birth: 02-25-1941   Medicare Important Message Given:  Yes    Kyla Balzarine 06/07/2015, 12:31 PM

## 2015-06-07 NOTE — Progress Notes (Signed)
ANTICOAGULATION CONSULT NOTE - Initial Consult  Pharmacy Consult for warfarin/Hepairn Indication: atrial fibrillation and MVR  Allergies  Allergen Reactions  . Codeine Other (See Comments)  . Aspirin     unknown  . Penicillins     Has patient had a PCN reaction causing immediate rash, facial/tongue/throat swelling, SOB or lightheadedness with hypotension: NO Has patient had a PCN reaction causing severe rash involving mucus membranes or skin necrosis: NO Has patient had a PCN reaction that required hospitalization NO Has patient had a PCN reaction occurring within the last 10 years: NO If all of the above answers are "NO", then may proceed with Cephalosporin use.    Patient Measurements: Height:  (165.1 cm) Weight: 141 lb 1.6 oz (64.003 kg) IBW/kg (Calculated) : 57 Heparin Dosing Weight: 64 kg  Vital Signs: Temp: 99 F (37.2 C) (01/25 0815) Temp Source: Oral (01/25 0815) BP: 144/78 mmHg (01/25 0815) Pulse Rate: 125 (01/25 0815)  Labs:  Recent Labs  06/05/15 0330 06/05/15 1829 06/06/15 0227 06/06/15 1959 06/07/15 0413  HGB 9.9*  --  9.9*  --  9.9*  HCT 28.7*  --  29.4*  --  29.6*  PLT 151  --  176  --  197  LABPROT 17.5*  --  26.7*  --  31.7*  INR 1.42  --  2.50*  --  3.14*  HEPARINUNFRC  --  0.30 0.58  --  0.51  CREATININE 0.57  --  0.57  --  0.53  TROPONINI  --   --   --  <0.03  --     Estimated Creatinine Clearance: 54.7 mL/min (by C-G formula based on Cr of 0.53).  Assessment: 74 yof on heparin bridge to coumadin post an episode of LGIB, and s/p Kcentra + vitamin K. INR 1.42 > 2.5>3.14, heparin level therapeutic 0.53 on 1000 units/hr. Hgb 9.9, plt 197 K, stable  Coumadin PTA dose:  daily except for 2.5 mg on Thursday and Sundays, admission INR > 10, her outpatient INR has been mostly therapeutic on this regimen since Aug 2016   Goal of Therapy:  INR 3-4 Monitor platelets by anticoagulation protocol: Yes   Plan:  - D/C IV heparin - Warfarin 4  mg PO x 1 tonight - Daily INR, heparin level and CBC - If discharge home recommend 5 mg on MWFSun, 2.5 mg on TTSat  Bayard Hugger, PharmD, BCPS  Clinical Pharmacist  Pager: [phone]   06/07/2015,11:32 AM

## 2015-06-07 NOTE — Progress Notes (Signed)
Patient seen and examined. Case d/w residents in detail. I agree with findings and plan as documented in Dr. Marily Lente note.  Patient with no new complaints. Noted to have persistent afib/flutter with rvr. C/w metoprolol. Started on cardizem for better rate control. EKG with aflutter with no acute ST/T wave changes. Troponins remain negative. Will d/w patient's cardiologist - Dr. Shirlee Latch  Patient with likely aspiration PNA in RLL. Started on meropenem yesterday. Leukocytosis improved today. Will monitor. Will transition to PO clindamycin to complete course once ready for discharge.  No further bleeding episodes and Hg remains stable. C/w anticoagulation per pharmacy for mechanical mitral valve and afib.

## 2015-06-08 DIAGNOSIS — I4892 Unspecified atrial flutter: Secondary | ICD-10-CM

## 2015-06-08 LAB — BASIC METABOLIC PANEL
Anion gap: 7 (ref 5–15)
BUN: 5 mg/dL — ABNORMAL LOW (ref 6–20)
CALCIUM: 8.6 mg/dL — AB (ref 8.9–10.3)
CO2: 25 mmol/L (ref 22–32)
CREATININE: 0.51 mg/dL (ref 0.44–1.00)
Chloride: 107 mmol/L (ref 101–111)
GFR calc non Af Amer: >60 mL/min (ref >60)
Glucose, Bld: 111 mg/dL — ABNORMAL HIGH (ref 65–99)
Potassium: 3.7 mmol/L (ref 3.5–5.1)
Sodium: 139 mmol/L (ref 135–145)

## 2015-06-08 LAB — CBC
HEMATOCRIT: 28.5 % — AB (ref 36.0–46.0)
Hemoglobin: 9.6 g/dL — ABNORMAL LOW (ref 12.0–15.0)
MCH: 30.7 pg (ref 26.0–34.0)
MCHC: 33.7 g/dL (ref 30.0–36.0)
MCV: 91.1 fL (ref 78.0–100.0)
PLATELETS: 208 10*3/uL (ref 150–400)
RBC: 3.13 MIL/uL — ABNORMAL LOW (ref 3.87–5.11)
RDW: 16.6 % — AB (ref 11.5–15.5)
WBC: 7.1 10*3/uL (ref 4.0–10.5)

## 2015-06-08 LAB — PROTIME-INR
INR: 2.65 — AB (ref 0.00–1.49)
PROTHROMBIN TIME: 27.9 s — AB (ref 11.6–15.2)

## 2015-06-08 LAB — MAGNESIUM: Magnesium: 2.2 mg/dL (ref 1.7–2.4)

## 2015-06-08 MED ORDER — CLINDAMYCIN HCL 300 MG PO CAPS
300.0000 mg | ORAL_CAPSULE | Freq: Three times a day (TID) | ORAL | Status: DC
  Administered 2015-06-08 – 2015-06-09 (×2): 300 mg via ORAL
  Filled 2015-06-08 (×7): qty 1

## 2015-06-08 MED ORDER — DIGOXIN 0.25 MG/ML IJ SOLN
0.5000 mg | Freq: Once | INTRAMUSCULAR | Status: AC
  Administered 2015-06-08: 0.5 mg via INTRAVENOUS
  Filled 2015-06-08: qty 2

## 2015-06-08 MED ORDER — WARFARIN SODIUM 5 MG PO TABS
6.0000 mg | ORAL_TABLET | ORAL | Status: AC
  Administered 2015-06-08: 6 mg via ORAL
  Filled 2015-06-08: qty 1

## 2015-06-08 MED ORDER — DILTIAZEM HCL 60 MG PO TABS
90.0000 mg | ORAL_TABLET | Freq: Four times a day (QID) | ORAL | Status: DC
  Administered 2015-06-08 – 2015-06-09 (×4): 90 mg via ORAL
  Filled 2015-06-08 (×4): qty 1

## 2015-06-08 MED ORDER — DILTIAZEM HCL 60 MG PO TABS
60.0000 mg | ORAL_TABLET | Freq: Four times a day (QID) | ORAL | Status: DC
  Administered 2015-06-08: 60 mg via ORAL

## 2015-06-08 MED ORDER — CLINDAMYCIN HCL 300 MG PO CAPS: 600.0000 mg | ORAL_CAPSULE | Freq: Three times a day (TID) | ORAL | Status: DC

## 2015-06-08 MED ORDER — DIGOXIN 0.25 MG/ML IJ SOLN
0.2500 mg | Freq: Once | INTRAMUSCULAR | Status: AC
  Administered 2015-06-09: 0.25 mg via INTRAVENOUS
  Filled 2015-06-08: qty 1

## 2015-06-08 NOTE — Progress Notes (Signed)
Patient Name: [name] [name] Date of Encounter: 06/08/2015  Hospital Problem List     Active Problems:   Essential hypertension, benign   PAF (paroxysmal atrial fibrillation) (HCC)   Dementia   H/O prosthetic heart valve   Chest pain   Acute upper gastrointestinal bleeding   Erosive gastritis with hemorrhage   Atrial flutter (HCC)   Hypokalemia   Acute blood loss anemia   Non-occlusive coronary artery disease    Subjective   She feels fine. No complaints  Inpatient Medications    . buPROPion  75 mg Oral q morning - 10a  . clindamycin  300 mg Oral 3 times per day  . diclofenac sodium  2 g Topical QID  . diltiazem  90 mg Oral 4 times per day  . escitalopram  20 mg Oral Daily  . metoprolol  100 mg Oral BID  . nicotine  14 mg Transdermal Daily  . pantoprazole  40 mg Oral QAC breakfast  . potassium chloride  40 mEq Oral Daily  . temazepam  30 mg Oral QHS  . Warfarin - Pharmacist Dosing Inpatient   Does not apply q1800    Vital Signs    Filed Vitals:   06/08/15 0400 06/08/15 0854 06/08/15 1104 06/08/15 1341  BP: 122/69 131/78 117/61   Pulse:   125 122  Temp: 98.6 F (37 C)     TempSrc: Oral     Resp: 20     Height:      Weight: 140 lb 3.2 oz (63.594 kg)     SpO2: 97%       Intake/Output Summary (Last 24 hours) at 06/08/15 1634 Last data filed at 06/08/15 0926  Gross per 24 hour  Intake    120 ml  Output   2550 ml  Net  -2430 ml   Filed Weights   06/06/15 0344 06/07/15 0302 06/08/15 0400  Weight: 143 lb 12.8 oz (65.227 kg) 141 lb 1.6 oz (64.003 kg) 140 lb 3.2 oz (63.594 kg)    Physical Exam    General: Pleasant, NAD. Pleasantly confused Neuro: Alert and oriented X 3. Moves all extremities spontaneously. She is somewhat confused and is not really answering questions totally appropriate. Has signs of dementia. Psych: Normal affect. HEENT:  Normal  Neck: Supple without bruits or JVD. Lungs:  Resp regular and unlabored, CTA. Heart: RR - with  tachycardia no s3, s4, or murmurs. Abdomen: Soft, non-tender, non-distended, BS + x 4.  Extremities: No clubbing, cyanosis or edema. DP/PT/Radials 2+ and equal bilaterally.  Labs    CBC  Recent Labs  06/07/15 0413 06/08/15 0509  WBC 12.2* 7.1  HGB 9.9* 9.6*  HCT 29.6* 28.5*  MCV 90.0 91.1  PLT 197 208   Basic Metabolic Panel  Recent Labs  06/07/15 0413 06/07/15 1551 06/08/15 0509  NA 135  --  139  K 3.2*  --  3.7  CL 99*  --  107  CO2 22  --  25  GLUCOSE 129*  --  111*  BUN <5*  --  5*  CREATININE 0.53  --  0.51  CALCIUM 8.1*  --  8.6*  MG  --  1.7 2.2   Liver Function Tests No results for input(s): AST, ALT, ALKPHOS, BILITOT, PROT, ALBUMIN in the last 72 hours. No results for input(s): LIPASE, AMYLASE in the last 72 hours. Cardiac Enzymes  Recent Labs  06/06/15 1959  TROPONINI <0.03   BNP  Recent Labs  06/07/15 1551  TSH 1.984    Telemetry    After initially being rate controlled in the 90s, now has been steady and 120-130 bpm - has the appearance of sinus tachycardia, but I suspect that this is probably slow atrial flutter  ECG   Note EKG today  Radiology    No new studies   Assessment & Plan   Active Problems:   Essential hypertension, benign   PAF (paroxysmal atrial fibrillation) (HCC)   Dementia   H/O prosthetic heart valve   Chest pain   Acute upper gastrointestinal bleeding   Erosive gastritis with hemorrhage   Atrial flutter (HCC)   Hypokalemia   Acute blood loss anemia   Non-occlusive coronary artery disease   1. Upper GI bleed with ABL anemia - due to prepyloric erosions in the setting of supratherapeutic INR. Will need close INR f/u at discharge.  2. Hospital acquired versus aspiration PNA - per IM.  3. Persistent atrial flutter (with prior history of paroxysmal atrial fib/flutter) - r  She has been essentially in rapid atrial flutter since admission. Almost 2:1 with variable block.  I do agree with having titrated up  her diltiazem dose today.  She is also on full dose metoprolol. --  for rate control if we think that this is causing her pulmonary issues to be worse.  Unfortunately with recent GI bleed, and having had to allow her INR to get subtherapeutic, where limited the ability to cardiovert . We cannot proceed with DCCV at present time due to the reversal of coagulopathy and subtherapeutic INR during admission - would need a TEE first. With recent GIB, we hope to avoid TEE/DCCV in case further interventions were needed which would disrupt the 4-week timeframe of necessary uninterrupted anticoagulation.   Given normal renal function we can try digoxin if diltiazem does not decrease her rate. -- We'll do digoxin loading today  Hopefully further treatment of her PNA and hypokalemia will help as well. Will also check thyroid function. Updating her echo would be helpful when HR is slower.  4. 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  5. Nonobstructive CAD by cath 2007 - troponins negative this admission. No recent convincing angina. Anticipate continued surveillance for symptoms.  6. Hypokalemia - it appears from I/O's that patient may have been autodiuresing after blood transfusions. Despite daily her K has consistently run 3.1-3.2. Will give another now and start daily in AM. Check Mg and replete PRN.   we'll continue to follow   Signed, Marykay Lex, M.D., M.S. Interventional Cardiologist   Pager # [phone] Phone # [phone] [address] [place], [place] [address]

## 2015-06-08 NOTE — Progress Notes (Signed)
Subjective:  Patient and her husband in the room deny any chest pain, dyspnea, leg swelling, fever, or bleeding episodes.  Objective: Vital signs in last 24 hours: Filed Vitals:   06/08/15 0400 06/08/15 0854 06/08/15 1104 06/08/15 1341  BP: 122/69 131/78 117/61   Pulse:   125 122  Temp: 98.6 F (37 C)     TempSrc: Oral     Resp: 20     Height:      Weight: 140 lb 3.2 oz (63.594 kg)     SpO2: 97%      Weight change: -14.4 oz (-0.408 kg)  Intake/Output Summary (Last 24 hours) at 06/08/15 1346 Last data filed at 06/08/15 0926  Gross per 24 hour  Intake    120 ml  Output   2550 ml  Net  -2430 ml   Physical Exam General: Lying in bed, NAD HEENT: Sclerae anicteric. NCAT Cardiovascular: Loud and can be auscultated from her back. Tachycardic. Irregularly irregular rhythm with systolic murmur  Pulmonary: CTAB. Unlabored breathing Abdominal: No TTP. Normal bowel sounds Extremities: No clubbing, cyanosis, or edema Neurological: Follows commands. Speech normal.  Lab Results: Basic Metabolic Panel:  Recent Labs Lab 06/07/15 0413 06/07/15 1551 06/08/15 0509  NA 135  --  139  K 3.2*  --  3.7  CL 99*  --  107  CO2 22  --  25  GLUCOSE 129*  --  111*  BUN <5*  --  5*  CREATININE 0.53  --  0.51  CALCIUM 8.1*  --  8.6*  MG  --  1.7 2.2   CBC:  Recent Labs Lab 06/07/15 0413 06/08/15 0509  WBC 12.2* 7.1  HGB 9.9* 9.6*  HCT 29.6* 28.5*  MCV 90.0 91.1  PLT 197 208    Coagulation:  Recent Labs Lab 06/05/15 0330 06/06/15 0227 06/07/15 0413 06/08/15 0509  LABPROT 17.5* 26.7* 31.7* 27.9*  INR 1.42 2.50* 3.14* 2.65*   Studies/Results: Dg Chest Port 1 View  06/06/2015  CLINICAL DATA:  Leukocytosis. EXAM: PORTABLE CHEST 1 VIEW COMPARISON:  06/03/2015. FINDINGS: The cardiac silhouette remains mildly enlarged. The lungs remain hyperexpanded. Interval patchy opacity at the right lung base and small right pleural effusion. Interval mild linear density at the left lung  base. The interstitial markings remain mildly prominent stable mild diffuse peribronchial thickening. Unremarkable bones. IMPRESSION: 1. Interval probable pneumonia at the right lung base with a small right pleural effusion. 2. Minimal left basilar atelectasis. 3. Stable cardiomegaly and changes of COPD and chronic bronchitis. Electronically Signed   By: Beckie Salts M.D.   On: 06/06/2015 13:57   Medications: I have reviewed the patient's current medications. Scheduled Meds: . buPROPion  75 mg Oral q morning - 10a  . diclofenac sodium  2 g Topical QID  . diltiazem  90 mg Oral 4 times per day  . escitalopram  20 mg Oral Daily  . meropenem (MERREM) IV  1 g Intravenous Q8H  . metoprolol  100 mg Oral BID  . nicotine  14 mg Transdermal Daily  . pantoprazole  40 mg Oral QAC breakfast  . potassium chloride  40 mEq Oral Daily  . temazepam  30 mg Oral QHS  . Warfarin - Pharmacist Dosing Inpatient   Does not apply q1800   Continuous Infusions:   PRN Meds:.acetaminophen, ALPRAZolam, ALPRAZolam Assessment/Plan:  Gastrointestinal Bleed: Gastric erosions seen on EGD. At least one normal BM  yesterday. Hgb stable in 9's.  - CBC in AM - Protonix 40  mg daily - Telemetry - Heart Healthy Diet  Hospital Acquired versus Aspiration Pneumonia: Patient noted to have developing pneumonia in RLL, which could be HAP or aspiration associated with EGD. Leukocytosis to 14 has resolved to 7's. She has remained afebrile and satting well on room air. Transition to oral abx today - Clindamycin 600 mg TID for total 10 day course (final dose 2/2) - CBC in AM  Atrial Fibrillation with RVR: HRs remain poorly controlled with rates sustained in the 120s. Could be driven by infection given leukocytosis, which is resolving. Cardiology is following - Anticoagulation plan per above - Metoprolol 100 mg BID - Diltiazem 90 mg TID  Mechanical Mitral Valve: Restarted anticoagulation. Target INR 3-4. INR today is therapeutic at  2.65 today. - Warfarin per pharm - INR in AM  Anxiety and Depression: Patient has intermittent episodes of agitation early in hospitalization, but it has been more controlled with family around.  - Bupropion 75 mg daily, escitalopram 20 mg daily - Alprazolam 0.5 mg daily in mornings (9am-11am); 1 mg daily prn in afternoons (12pm-4pm) - Temazepam 30 mg qhs  Tobacco Abuse: 14 mg nicotine patch  DVT Prophylaxis: SCDs, anticoagulation per above  Dispo: Anticipated discharge home with PT once INR therapeutic (3-4) for 24 hours. tachycardia resolves, and pneumonia shows continued signs of improvement. Anticipated discharge in 1-3 days.  The patient does have a current PCP Lorie Phenix, MD) and does not need an [facility] hospital follow-up appointment after discharge.  The patient does have transportation limitations that hinder transportation to clinic appointments.  .Services Needed at time of discharge: Y = Yes, Blank = No PT: Y  OT:   RN:   Equipment:   Other:     LOS: 5 days   Ruben Im, MD 06/08/2015, 1:46 PM

## 2015-06-08 NOTE — Plan of Care (Signed)
Problem: Safety: Goal: Ability to remain free from injury will improve Outcome: Completed/Met Date Met:  06/08/15 Bed alarm in place

## 2015-06-08 NOTE — Progress Notes (Signed)
Patient seen and examined. Case d/w residents in detail. I agree with findings and plan as documented in Dr. Marily Lente note.  Patient's Hg remains stable. Will c/w PPI. C/w warfarin per pharmacy. No further episodes of GI bleed. Will monitor closely.  Patient with RLL PNA - likely aspiration. Leukocytosis resolved. D/c meropenem. C/w clindamycin to complete 10 day course of abx.   Patient still with afib with rvr. Cardio f/u appreciated. Patient is not a candidate for cardioversion at this time as we may need to hold a/c if recurrent GI bleed. C/w metoprolol. Increase cardizem to 90 mg tid and monitor. May need to start dig if remains uncontrolled. Will d/w cardio

## 2015-06-08 NOTE — Plan of Care (Signed)
Problem: Phase II Progression Outcomes Goal: Anginal pain relieved Outcome: Completed/Met Date Met:  06/08/15 No chest pain reported by patient and patient sleep comfortably most of the night.

## 2015-06-08 NOTE — Progress Notes (Signed)
Physical Therapy Treatment Patient Details Name: [name] [name] MRN: [id number] DOB: 1940-07-28 Today's Date: 06/08/2015    History of Present Illness [name] [name] is a woman who came in with elevated INR and likely upper GIB with dark stools and epigastric pain. PMH MVR, a-fib, CVA, fall with hip replacement    PT Comments    Pt demo increased activity tolerance.  HR better controlled at rest but remains to elevate with activity.    Follow Up Recommendations  Home health PT;Supervision for mobility/OOB     Equipment Recommendations  None recommended by PT    Recommendations for Other Services       Precautions / Restrictions Precautions Precautions: Fall Restrictions Weight Bearing Restrictions: No    Mobility  Bed Mobility Overal bed mobility: Needs Assistance Bed Mobility: Supine to Sit     Supine to sit: Supervision     General bed mobility comments: pt able to get self to EOB with increased time and HOB elevated, pt req cues for hand placement and advancement of LEs to edge of bed.    Transfers Overall transfer level: Needs assistance Equipment used: Rolling walker (2 wheeled) Transfers: Sit to/from Stand Sit to Stand: Min assist         General transfer comment: Pt reports mild dizziness on standing, req tactile cues for forward weight shifting and VCs for hand placement and foot placement.  Pt req repeated cueing for foot placement.   Ambulation/Gait Ambulation/Gait assistance: Min assist Ambulation Distance (Feet): 120 Feet Assistive device: Rolling walker (2 wheeled) Gait Pattern/deviations: Step-through pattern;Decreased step length - right;Decreased step length - left;Narrow base of support;Trunk flexed Gait velocity: decreased   General Gait Details: HR remains elevated during gt training only pt denies fatigue but gt limited secondary to elevation of HR.     Stairs            Wheelchair Mobility    Modified Rankin (Stroke Patients  Only)       Balance Overall balance assessment: Needs assistance Sitting-balance support: Feet supported Sitting balance-Leahy Scale: Fair     Standing balance support: Bilateral upper extremity supported Standing balance-Leahy Scale: Poor                      Cognition Arousal/Alertness: Awake/alert Behavior During Therapy: WFL for tasks assessed/performed Overall Cognitive Status: History of cognitive impairments - at baseline       Memory: Decreased short-term memory              Exercises General Exercises - Lower Extremity Ankle Circles/Pumps: AROM;10 reps Quad Sets: AROM;10 reps    General Comments        Pertinent Vitals/Pain Pain Assessment: No/denies pain    Home Living                      Prior Function            PT Goals (current goals can now be found in the care plan section) Acute Rehab PT Goals Patient Stated Goal: return home Potential to Achieve Goals: Good Progress towards PT goals: Progressing toward goals    Frequency  Min 3X/week    PT Plan      Co-evaluation             End of Session Equipment Utilized During Treatment: Gait belt Activity Tolerance:  (gt limited due to tachycardia) Patient left: in bed;with call bell/phone within reach;with family/visitor present;with bed alarm set  Time: 1191-4782 PT Time Calculation (min) (ACUTE ONLY): 18 min  Charges:  $Gait Training: 8-22 mins                    G Codes:      [name] [name] 11-Jun-2015, 1:54 PM  [name] [name], PTA

## 2015-06-08 NOTE — Progress Notes (Signed)
UR Completed Alexzavier Girardin Graves-Bigelow, RN,BSN [phone]  

## 2015-06-08 NOTE — Care Management Note (Signed)
Case Management Note  Patient Details  Name: [name] [name] MRN: [id number] Date of Birth: Oct 19, 1940  Subjective/Objective:      Pt admitted for  Chest Pain. Plan will be for home once stable. Tweaking medications.  Pt is from home with husband.               Action/Plan: CM did speak with both pt ad husband. Pt didn't speak much, Husband mostly answered questions. Per husband pt will not need HH Services at this time. Pt has DME RW and Cane at home. No further needs from CM at this time.    Expected Discharge Date:                  Expected Discharge Plan:  Home/Self Care  In-House Referral:  NA  Discharge planning Services  CM Consult  Post Acute Care Choice:  NA Choice offered to:  NA  DME Arranged:  N/A DME Agency:  NA  HH Arranged:  NA HH Agency:  NA  Status of Service:  Completed, signed off  Medicare Important Message Given:  Yes Date Medicare IM Given:    Medicare IM give by:    Date Additional Medicare IM Given:    Additional Medicare Important Message give by:     If discussed at Long Length of Stay Meetings, dates discussed:    Additional Comments:  Gala Lewandowsky, RN 06/08/2015, 12:53 PM

## 2015-06-08 NOTE — Progress Notes (Addendum)
ANTICOAGULATION CONSULT NOTE - Initial Consult  Pharmacy Consult for warfarin/Hepairn Indication: atrial fibrillation and MVR  Allergies  Allergen Reactions  . Codeine Other (See Comments)  . Aspirin     unknown  . Penicillins     Has patient had a PCN reaction causing immediate rash, facial/tongue/throat swelling, SOB or lightheadedness with hypotension: NO Has patient had a PCN reaction causing severe rash involving mucus membranes or skin necrosis: NO Has patient had a PCN reaction that required hospitalization NO Has patient had a PCN reaction occurring within the last 10 years: NO If all of the above answers are "NO", then may proceed with Cephalosporin use.    Patient Measurements: Height:  (165.1 cm) Weight: 140 lb 3.2 oz (63.594 kg) IBW/kg (Calculated) : 57 Heparin Dosing Weight: 64 kg  Vital Signs: Temp: 98.6 F (37 C) (01/26 0400) Temp Source: Oral (01/26 0400) BP: 117/61 mmHg (01/26 1104) Pulse Rate: 125 (01/26 1104)  Labs:  Recent Labs  06/05/15 1829  06/06/15 0227 06/06/15 1959 06/07/15 0413 06/08/15 0509  HGB  --   < > 9.9*  --  9.9* 9.6*  HCT  --   --  29.4*  --  29.6* 28.5*  PLT  --   --  176  --  197 208  LABPROT  --   --  26.7*  --  31.7* 27.9*  INR  --   --  2.50*  --  3.14* 2.65*  HEPARINUNFRC 0.30  --  0.58  --  0.51  --   CREATININE  --   --  0.57  --  0.53 0.51  TROPONINI  --   --   --  <0.03  --   --   < > = values in this interval not displayed.  Estimated Creatinine Clearance: 54.7 mL/min (by C-G formula based on Cr of 0.51).  Assessment: 74 yof resumed on coumadin post an episode of LGIB, and s/p Kcentra + vitamin K (1/21). INR 3.14 > 2.65. Hgb 9.6, plt 208 K, stable  Coumadin PTA dose:  daily except for 2.5 mg on Thursday and Sundays, admission INR > 10, her outpatient INR has been mostly therapeutic on this regimen since Aug 2016   Goal of Therapy:  INR 3-4 Monitor platelets by anticoagulation protocol: Yes   Plan:  -  Warfarin 6 mg PO x 1 now - Daily INR  Bayard Hugger, PharmD, BCPS  Clinical Pharmacist  Pager: [phone]   06/08/2015,12:13 PM

## 2015-06-09 ENCOUNTER — Ambulatory Visit: Payer: Medicare PPO | Admitting: Cardiology

## 2015-06-09 DIAGNOSIS — J690 Pneumonitis due to inhalation of food and vomit: Secondary | ICD-10-CM | POA: Diagnosis not present

## 2015-06-09 DIAGNOSIS — Y95 Nosocomial condition: Secondary | ICD-10-CM

## 2015-06-09 DIAGNOSIS — F0390 Unspecified dementia without behavioral disturbance: Secondary | ICD-10-CM

## 2015-06-09 LAB — CBC
HCT: 30 % — ABNORMAL LOW (ref 36.0–46.0)
HEMOGLOBIN: 10.2 g/dL — AB (ref 12.0–15.0)
MCH: 30.8 pg (ref 26.0–34.0)
MCHC: 34 g/dL (ref 30.0–36.0)
MCV: 90.6 fL (ref 78.0–100.0)
PLATELETS: 250 10*3/uL (ref 150–400)
RBC: 3.31 MIL/uL — AB (ref 3.87–5.11)
RDW: 15.9 % — ABNORMAL HIGH (ref 11.5–15.5)
WBC: 8.1 10*3/uL (ref 4.0–10.5)

## 2015-06-09 LAB — BASIC METABOLIC PANEL
ANION GAP: 10 (ref 5–15)
BUN: 8 mg/dL (ref 6–20)
CO2: 25 mmol/L (ref 22–32)
Calcium: 9.1 mg/dL (ref 8.9–10.3)
Chloride: 100 mmol/L — ABNORMAL LOW (ref 101–111)
Creatinine, Ser: 0.61 mg/dL (ref 0.44–1.00)
GFR calc Af Amer: >60 mL/min (ref >60)
Glucose, Bld: 101 mg/dL — ABNORMAL HIGH (ref 65–99)
POTASSIUM: 4.2 mmol/L (ref 3.5–5.1)
SODIUM: 135 mmol/L (ref 135–145)

## 2015-06-09 LAB — PROTIME-INR
INR: 2.93 — ABNORMAL HIGH (ref 0.00–1.49)
PROTHROMBIN TIME: 30.1 s — AB (ref 11.6–15.2)

## 2015-06-09 MED ORDER — PANTOPRAZOLE SODIUM 40 MG PO TBEC
40.0000 mg | DELAYED_RELEASE_TABLET | Freq: Every day | ORAL | Status: DC
Start: 2015-06-09 — End: 2015-10-05

## 2015-06-09 MED ORDER — METOPROLOL TARTRATE 100 MG PO TABS
100.0000 mg | ORAL_TABLET | Freq: Two times a day (BID) | ORAL | Status: DC
Start: 2015-06-09 — End: 2015-09-26

## 2015-06-09 MED ORDER — WARFARIN SODIUM 5 MG PO TABS: 2.5000 mg | ORAL_TABLET | ORAL | Status: DC

## 2015-06-09 MED ORDER — CLINDAMYCIN HCL 300 MG PO CAPS: 300.0000 mg | ORAL_CAPSULE | Freq: Three times a day (TID) | ORAL | Status: AC

## 2015-06-09 MED ORDER — DILTIAZEM HCL 90 MG PO TABS: 180.0000 mg | ORAL_TABLET | Freq: Four times a day (QID) | ORAL | Status: DC

## 2015-06-09 MED ORDER — DILTIAZEM HCL 90 MG PO TABS: 180.0000 mg | ORAL_TABLET | Freq: Two times a day (BID) | ORAL | Status: DC

## 2015-06-09 NOTE — Progress Notes (Signed)
Patient Name: [name] [name] Date of Encounter: 06/09/2015  Hospital Problem List     Principal Problem:   Aspiration pneumonia [facility]) Active Problems:   Erosive gastritis with hemorrhage   Atrial flutter (HCC)   Acute blood loss anemia   Non-occlusive coronary artery disease   Essential hypertension, benign   Dementia   H/O prosthetic heart valve   Chest pain   Hypokalemia   Last PM was given 2 doses of Digoxin with hope to slow HR & potentially break her rhythm.  Subjective   She feels fine. No complaints HR now is stable & on Tele in 60s.  (on Epic noted as 90s)  Inpatient Medications    . buPROPion  75 mg Oral q morning - 10a  . clindamycin  300 mg Oral 3 times per day  . diclofenac sodium  2 g Topical QID  . diltiazem  90 mg Oral 4 times per day  . escitalopram  20 mg Oral Daily  . metoprolol  100 mg Oral BID  . nicotine  14 mg Transdermal Daily  . pantoprazole  40 mg Oral QAC breakfast  . potassium chloride  40 mEq Oral Daily  . temazepam  30 mg Oral QHS  . Warfarin - Pharmacist Dosing Inpatient   Does not apply q1800    Vital Signs    Filed Vitals:   06/08/15 2200 06/09/15 0056 06/09/15 0557 06/09/15 0838  BP: 112/60 142/60 118/58 105/66  Pulse: 92  98 96  Temp:   98.3 F (36.8 C)   TempSrc:   Oral   Resp:   18   Height:      Weight:   137 lb (62.143 kg)   SpO2:   96%     Intake/Output Summary (Last 24 hours) at 06/09/15 1015 Last data filed at 06/09/15 0900  Gross per 24 hour  Intake    600 ml  Output   1775 ml  Net  -1175 ml   Filed Weights   06/07/15 0302 06/08/15 0400 06/09/15 0557  Weight: 141 lb 1.6 oz (64.003 kg) 140 lb 3.2 oz (63.594 kg) 137 lb (62.143 kg)    Physical Exam    General: Pleasant, NAD. Pleasantly confused Neuro: Alert and oriented X 3. Moves all extremities spontaneously. She is somewhat confused and is not really answering questions totally appropriate. Has signs of dementia. Psych: Normal affect. HEENT:   Normal  Neck: Supple without bruits or JVD. Lungs:  Resp regular and unlabored, CTA. Heart: RR - with tachycardia no s3, s4, or murmurs. Abdomen: Soft, non-tender, non-distended, BS + x 4.  Extremities: No clubbing, cyanosis or edema. DP/PT/Radials 2+ and equal bilaterally.  Labs    CBC  Recent Labs  06/08/15 0509 06/09/15 0438  WBC 7.1 8.1  HGB 9.6* 10.2*  HCT 28.5* 30.0*  MCV 91.1 90.6  PLT 208 250   Basic Metabolic Panel  Recent Labs  06/07/15 1551 06/08/15 0509 06/09/15 0438  NA  --  139 135  K  --  3.7 4.2  CL  --  107 100*  CO2  --  25 25  GLUCOSE  --  111* 101*  BUN  --  5* 8  CREATININE  --  0.51 0.61  CALCIUM  --  8.6* 9.1  MG 1.7 2.2  --    Liver Function Tests No results for input(s): AST, ALT, ALKPHOS, BILITOT, PROT, ALBUMIN in the last 72 hours. No results for input(s): LIPASE, AMYLASE in the last  72 hours. Cardiac Enzymes  Recent Labs  06/06/15 1959  TROPONINI <0.03   BNP  Recent Labs  06/07/15 1551  TSH 1.984    Telemetry    After initially being rate controlled in the 90s, now has been steady in 60s  bpm - has the appearance of sinus tachycardia, but I suspect that this is probably slow atrial flutter  ECG   EKG ordered for today  Radiology    No new studies   Assessment & Plan   Principal Problem:   Aspiration pneumonia (HCC) Active Problems:   Erosive gastritis with hemorrhage   Atrial flutter (HCC)   Acute blood loss anemia   Non-occlusive coronary artery disease   Essential hypertension, benign   Dementia   H/O prosthetic heart valve   Chest pain   Hypokalemia   1. Upper GI bleed with ABL anemia - due to prepyloric erosions in the setting of supratherapeutic INR. Will need close INR f/u at discharge.  2. Hospital acquired versus aspiration PNA - per IM.  3. Persistent atrial flutter (with prior history of paroxysmal atrial fib/flutter) - She has been essentially in rapid atrial flutter since admission. Almost  2:1 with variable block.  CCB dose increased yesterday & Dig load last PM - now rate improve.   Now on High dose CCB & BB (would convert CCB dose to 180 mg BID for d/c)  Will check EKG today as her HR has notably improved, but too hard to interpret on Tele.  Back on Warfarin with stable Hgb  Given normal renal function we can try digoxin if diltiazem does not decrease her rate. -- We'll do digoxin loading today  Hopefully further treatment of her PNA and hypokalemia will help as well. Will also check thyroid function. Updating her echo would be helpful when HR is slower.  4. 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 - Stable INR & Hgb.  5. Nonobstructive CAD by cath 2007 - troponins negative this admission. No recent convincing angina. Anticipate continued surveillance for symptoms.  6. Hypokalemia - levels have improved.  Continue daily K+ supplement. Check Mg and replete PRN.  Expected d/c home soon.    Signed, Marykay Lex, M.D., M.S. Interventional Cardiologist   Pager # [phone] Phone # [phone] [address] [place], [place] [address]

## 2015-06-09 NOTE — Discharge Summary (Signed)
Name: [name] [name] MRN: [id number] DOB: April 16, 1941 75 y.o. PCP: Lorie Phenix, MD  Date of Admission: 06/03/2015  9:49 AM Date of Discharge: 06/09/2015 Attending Physician: Earl Lagos, MD Discharge Diagnosis: 1. Erosive Gastritis with Hemorrhage 2. Atrial Flutter 3. Atrial Fibrillation 4. Mechanical Mitral Valve 5. Hospital-Associated Pneumonia 6. Dementia with Anxiety and Depression  Discharge Medications:   Medication List    STOP taking these medications        guaiFENesin 100 MG/5ML liquid  Commonly known as:  ROBITUSSIN     HYDROcodone-acetaminophen 5-325 MG tablet  Commonly known as:  NORCO     methocarbamol 500 MG tablet  Commonly known as:  ROBAXIN      TAKE these medications        ALPRAZolam 0.5 MG tablet  Commonly known as:  XANAX  TAKE 1/2 TO 1 TABLET BY MOUTH EVERY 6 HOURS AS NEEDED FOR ANXIETY     aspirin EC 81 MG tablet  Take 81 mg by mouth daily.     atorvastatin 20 MG tablet  Commonly known as:  LIPITOR  TAKE 1 TABLET BY MOUTH ONCE A DAY     buPROPion 75 MG tablet  Commonly known as:  WELLBUTRIN  TAKE 1 TABLET BY MOUTH EVERY MORNING     clindamycin 300 MG capsule  Commonly known as:  CLEOCIN  Take 1 capsule (300 mg total) by mouth 3 (three) times daily.     diltiazem 90 MG tablet  Commonly known as:  CARDIZEM  Take 2 tablets (180 mg total) by mouth 2 (two) times daily.     EQL ONE DAILY WOMENS PO  Take 1 tablet by mouth daily.     escitalopram 20 MG tablet  Commonly known as:  LEXAPRO  TAKE 1 TABLET BY MOUTH ONCE A DAY     lisinopril 10 MG tablet  Commonly known as:  PRINIVIL,ZESTRIL  TAKE 1 TABLET BY MOUTH ONCE A DAY     metoprolol 100 MG tablet  Commonly known as:  LOPRESSOR  Take 1 tablet (100 mg total) by mouth 2 (two) times daily.     pantoprazole 40 MG tablet  Commonly known as:  PROTONIX  Take 1 tablet (40 mg total) by mouth daily before breakfast.     temazepam 30 MG capsule  Commonly known as:  RESTORIL   TAKE 1 CAPSULE BY MOUTH EVERY NIGHT AT BEDTIME     TYLENOL PM EXTRA STRENGTH 25-500 MG Tabs tablet  Generic drug:  diphenhydramine-acetaminophen  Take 1 tablet by mouth at bedtime.     Vitamin B 12 250 MCG Lozg  Take 500 mg by mouth daily.     vitamin C 500 MG tablet  Commonly known as:  ASCORBIC ACID  Take 500 mg by mouth daily.     warfarin 5 MG tablet  Commonly known as:  COUMADIN  Take 0.5-1 tablets (2.5-5 mg total) by mouth See admin instructions. Take  every day except on Tuesdays, Sundays and Thursdays take 2.5mg         Disposition and follow-up:   [name] [name] was discharged from [facility] in stablecondition.  At the hospital follow up visit please address:  1.  Patient had an INR >10 on admission without a clear explanation, no changes in diet or new medications. Her INR needs close following, at least weekly initially, going forward.  2. Patient had pneumonia develop after EGD. Currently completing course of clindamycin.  3. Had heart rates to 120s  in a flutter after EGD. Controlled after increasing metoprolol to 100 mg BID and uptitrating diltiazem to 90 mg QID. May need digoxin of HRs still elevated at follow-up visit.  4. Erosive gastritis seen on EGD. Ask if patient has had melena.  5.  Labs / imaging needed at time of follow-up: Chest-x ray in 3-4 weeks to assess for resolution of pneumonia. INR, CBC  6.  Pending labs/ test needing follow-up: None  Follow-up Appointments: Follow-up Information    Follow up with Tereso Newcomer, PA-C On 06/16/2015.   Specialties:  Physician Assistant, Radiology, Interventional Cardiology   Why:  at 11:30 AM. Please obtain INR this visit.   Contact information:   [address] [place] [address] [phone]       Schedule an appointment as soon as possible for a visit with [facility].   Specialty:  Cardiology   Why:  To check INR   Contact information:   [address] [place] [address] [phone]      Please follow up.   Why:  Wed. Feb 8th 11:10 a.m.      Discharge Instructions: Discharge Instructions    Diet - low sodium heart healthy    Complete by:  As directed      Increase activity slowly    Complete by:  As directed           Mr and [name] Leflore, it was a joy taking care of [name] [name] while in the hospital. She was here for bleeding stomach ulcers because he blood was too thin (INR >10). We do not have a good explanation for why her INR shot up so much. Because of this, I would encourage you to pick up a new prescription of Warfarin, just in case there is something wrong with her old pills. To treat the ulcers, she will take Protonix, an acid reducer, daily.   I also have her on a new warfarin regimen that's a little lower than when she came in. This regimen was developed with help of a warfarin pharmacist. It is 5 mg every day except Tuesday, Thursday, and Saturday, when she will take 2.5 mg. I encourage you to move your coumadin clinic visit sooner with more frequent INR monitoring, at least weekly, for now. If you notice a lot of black stool or bleeding, please seek medical attention. If you notice stroke symptoms, please seek medical attention.  She also developed a pneumonia after her scope procedure to look into her stomach. She will take an antibiotic, Clindamycin, three times a day until February 2nd. She may get some diarrhea from this, but if the diarrhea gets much worse and is uncontrolled, please seek medical attention. If she develops worsening shortness of breath or a fever, please seek medical attention.  Her heart rate was also high after her procedure. For this, she is on a new dose of metoprolol, 100 mg twice a day. She is also on a new medication, Cardizem. She will take two pills twice a day of Cardizem. Please follow-up with your Cardiologist.  Consultations: Treatment Team:   Rounding Lbcardiology, MD  Procedures Performed:  Dg Chest Port 1 View  06/06/2015  CLINICAL DATA:  Leukocytosis. EXAM: PORTABLE CHEST 1 VIEW COMPARISON:  06/03/2015. FINDINGS: The cardiac silhouette remains mildly enlarged. The lungs remain hyperexpanded. Interval patchy opacity at the right lung base and small right pleural effusion. Interval mild linear density at the left lung base.  The interstitial markings remain mildly prominent stable mild diffuse peribronchial thickening. Unremarkable bones. IMPRESSION: 1. Interval probable pneumonia at the right lung base with a small right pleural effusion. 2. Minimal left basilar atelectasis. 3. Stable cardiomegaly and changes of COPD and chronic bronchitis. Electronically Signed   By: Beckie Salts M.D.   On: 06/06/2015 13:57   Dg Chest Port 1 View  06/03/2015  CLINICAL DATA:  75 year old female with chest pain earlier this morning and confusion currently. EXAM: PORTABLE CHEST 1 VIEW COMPARISON:  Prior chest x-ray 04/17/2009 FINDINGS: Stable cardiac and mediastinal contours. Mechanical mitral valve. Surgical clips present in the anterior mediastinum. Atherosclerotic calcifications again noted in the transverse aorta. Stable hyperinflation and bronchitic changes, particularly in the right middle lobe. There may be mild bronchiectasis. No focal infiltrate, pleural effusion, pulmonary edema or pneumothorax. Trace biapical pleural parenchymal scarring. No suspicious nodule or mass. No acute osseous abnormality. IMPRESSION: 1. No acute cardiopulmonary process. 2. Hyperinflation and central bronchitic change suggest underlying COPD. 3. Bronchitic change with possible mild bronchiectasis in the right middle lobe. Electronically Signed   By: Malachy Moan M.D.   On: 06/03/2015 11:05    Admission HPI: [name] [name] is a 75 year old with a past medical history of mitral valve regurgitation s/p mitral valve replacement (1980), PAF (on chronic warfarin for both), TIAs,  carotid stenosis, tobacco abuse, GERD, depression, anxiety, and dementia who presents with dark, watery stool. Given her dementia, most of history was obtained through her husband, [name] [name], who was present. She was in her normal state of health until the afternoon of 1/20 when she started to feel weak, short of breath, nauseous, and sweaty. She also felt an onset of non-radiation, chest and left arm pain. Shortly thereafter, she had an episode of black diarrhea. She fell asleep and her symptoms persisted until this morning, and the family decided to take her to the ED. She has never had an episode like this before. She has been taking her warfarin as prescribed, 5 mg every day except Sundays and Thursdays, when she takes 2.5 mg. She gets her INR checked every 5 weeks. There does not appear to be any changes in her warfarin regimen. She denies any recent changes in her diet or new medications. No recent NSAID use. Her husband said she has a slow, unintended weight loss, ~ 10 lbs, in the last year. In 2011 (158 lbs), but clinic visit in 01/2014 shows her current weight, 135 lbs. She has a chronically low appetite that is acutely worsening. She has never had a colonscopy before, and she has a history of declining health maintenance interventions (e.g., immunizations, mammograms). She denies light-headedness, headaches, blackouts, vomiting, palpitations, cough, hemoptysis, one-sided weakness, speech difficulties, congestion, and sore throat. She has a remote history of falls, breaking her hip last year, but no recent falls. Her mood has been good. She has a family history of unknown cancer in her brother, dementia, and CAD. She has a 40 pack-year smoking history and denies any alcohol use. She lives at home with her husband, who sees after her welfare. In the ED, she was hypotensive to 96/60 with pulse of 114. She was afebrile. She was noted to have a hemoglobin of 9.5, down from 15.9 on 05/11/15. She was FOBT  positive and noted to have black stool in the rectal vault without hemorrhoids. 2U pRBCs were transfused. Her INR was noted to be >10, it was 2.9 on 05/17/15, and she she was given IV vitamin  K 10 mg, 3U K-Centra, 4U FFP. GI was consulted.   Hospital Course by problem list:  Erosive Gastritis with Hemorrhage: After holding warfarin and reversal of supratherapeutic INR with K-centra, FFP, and IV vitamin K to <2, the patient's stool turned from black to light. However, on the night of admission, her Hgb decreased to 7.5. In the setting of dyspnea and chest pain, she was given pRBCs and her hemoglobin stabilized to the 9's and her vitals stabilized. Her dyspnea and chest pain improved as well, and troponins to rule out ACS were negative. On EGD on 1/22, she was noted to have non-bleeding pre-pyloric erosions erosions. She was started on oral pantoprazole and resumed on a normal diet. For the remainder of the admission, with her INR tightly controlled on anticoagulation, she had no additional bleeding episodes and her hemoglobin remained stable.  Atrial Flutter / Atrial Fibrillation: On admission, the patient had a HR in the 110s and was noted to be in atrial flutter. After her EGD, this worsened to the 120s. To assess whether an underlying infection was driving her atrial flutter, a UA (eventually reassuring) and chest x-ray were obtained. CXR demonstrated an interval development of a probably pneumonia, which may have been driving her atrial flutter. With the assistance of Cardiology, her metoprolol was increased from her home dose of 75 mg BID to 100 mg BID. Diltiazem was added and gradually titrated up to 90 mg QID by 1/26. She was given loading doses of digoxin on 1/26 as well. By 8:00 pm on the day before discharge, her heart rate remained <100 and in atrial fibrillation. She was discharged on metoprolol 100 mg BID and diltiazem 180 mg BID with close follow-up with cardiology. Anticoagulation resumption as  described in "Mechanical Mitral Valve."  Mechanical Mitral Valve: With Bjork-Shiley tilting disc valve in 1980. Patient's warfarin was held until after EGD. The patient was bridged to warfarin after EGD on 1/22 to a target INR of 3-4. This target was confirmed after reviewing previous anti-coagulation clinic office visits and noting that she had TIAs with the goal INR 2.5-3.5. She reached therapeutic level (3.14) by 1/25, and heparin was discontinued. INRs were 2.65 and 2.93 (day of discharge) on the following days. She was discharged on Warfarin 5 mg daily except for 2.5 mg on T, Th, Sat. The patient's husband was arranging a coumadin clinic visit next week at time of discharge.   Hospital-Associated Pneumonia: Given uncontrolled a flutter and steadily rising leukocytosis after EGD to 14.1 , infection was investigated with a CXR on 1/24 which demonstrated a RLL pneumonia. Etiology could have been an HCAP or aspiration during or after the EGD. The patient was noted to have a penicillin allergy in her chart, although the family could not recall her having a penicillin allergy or any severe reaction (e.g., anaphylaxis) to any medication. Nonetheless, the patient was started on IV meropenem and her leukocytosis and atrial flutter resolved. She was transitioned to clindamycin 300 mg TID to complete a 10 day course.   Dementia with Anxiety and Depression: The patient had been intermittently agitated at night that improved with family present during the admission. Patient has had intermittent episodes of agitation early in hospitalization, but it has been more controlled with family around. She was treated with Bupropion 75 mg daily, escitalopram 20 mg daily, Alprazolam 0.5 mg daily in mornings (9am-11am); 1 mg daily prn in afternoons (12pm-4pm), and Temazepam 30 mg qhs - consistent with her home regimen. She was discharged  on her home regimen.  Discharge Vitals:   BP 110/70 mmHg  Pulse 96  Temp(Src) 98.3 F  (36.8 C) (Oral)  Resp 18  Ht 5\' 5"  (1.651 m)  Wt 137 lb (62.143 kg)  BMI 22.80 kg/m2  SpO2 96%  Discharge Labs:  Results for orders placed or performed during the hospital encounter of 06/03/15 (from the past 24 hour(s))  Protime-INR     Status: Abnormal   Collection Time: 06/09/15  4:38 AM  Result Value Ref Range   Prothrombin Time 30.1 (H) 11.6 - 15.2 seconds   INR 2.93 (H) 0.00 - 1.49  CBC     Status: Abnormal   Collection Time: 06/09/15  4:38 AM  Result Value Ref Range   WBC 8.1 4.0 - 10.5 K/uL   RBC 3.31 (L) 3.87 - 5.11 MIL/uL   Hemoglobin 10.2 (L) 12.0 - 15.0 g/dL   HCT 16.1 (L) 09.6 - 04.5 %   MCV 90.6 78.0 - 100.0 fL   MCH 30.8 26.0 - 34.0 pg   MCHC 34.0 30.0 - 36.0 g/dL   RDW 40.9 (H) 81.1 - 91.4 %   Platelets 250 150 - 400 K/uL  Basic metabolic panel     Status: Abnormal   Collection Time: 06/09/15  4:38 AM  Result Value Ref Range   Sodium 135 135 - 145 mmol/L   Potassium 4.2 3.5 - 5.1 mmol/L   Chloride 100 (L) 101 - 111 mmol/L   CO2 25 22 - 32 mmol/L   Glucose, Bld 101 (H) 65 - 99 mg/dL   BUN 8 6 - 20 mg/dL   Creatinine, Ser 7.82 0.44 - 1.00 mg/dL   Calcium 9.1 8.9 - 95.6 mg/dL   GFR calc non Af Amer >60 >60 mL/min   GFR calc Af Amer >60 >60 mL/min   Anion gap 10 5 - 15    Signed: Ruben Im, MD 06/09/2015, 2:40 PM

## 2015-06-09 NOTE — Discharge Instructions (Signed)
Mr and [name] Zenz, it was a joy taking care of [name] [name] while in the hospital. She was here for bleeding stomach ulcers because he blood was too thin (INR >10). We do not have a good explanation for why her INR shot up so much. Because of this, I would encourage you to pick up a new prescription of Warfarin, just in case there is something wrong with her old pills. To treat the ulcers, she will take Protonix, an acid reducer, daily.   I also have her on a new warfarin regimen that's a little lower than when she came in. This regimen was developed with help of a warfarin pharmacist. It is 5 mg every day except Tuesday, Thursday, and Saturday, when she will take 2.5 mg. I encourage you to move your coumadin clinic visit sooner with more frequent INR monitoring, at least weekly, for now. If you notice a lot of black stool or bleeding, please seek medical attention. If you notice stroke symptoms, please seek medical attention.  She also developed a pneumonia after her scope procedure to look into her stomach. She will take an antibiotic, Clindamycin, three times a day until February 2nd. She may get some diarrhea from this, but if the diarrhea gets much worse and is uncontrolled, please seek medical attention. If she develops worsening shortness of breath or a fever, please seek medical attention.  Her heart rate was also high after her procedure. For this, she is on a new dose of metoprolol, 100 mg twice a day. She is also on a new medication, Cardizem. She will take two pills twice a day of Cardizem. Please follow-up with your Cardiologist.  Gastrointestinal Bleeding Gastrointestinal bleeding is bleeding somewhere along the path that food travels through the body (digestive tract). This path is anywhere between the mouth and the opening of the butt (anus). You may have blood in your throw up (vomit) or in your poop (stools). If there is a lot of bleeding, you may need to stay in the hospital. HOME  CARE  Only take medicine as told by your doctor.  Eat foods with fiber such as whole grains, fruits, and vegetables. You can also try eating 1 to 3 prunes a day.  Drink enough fluids to keep your pee (urine) clear or pale yellow. GET HELP RIGHT AWAY IF:   Your bleeding gets worse.  You feel dizzy, weak, or you pass out (faint).  You have bad cramps in your back or belly (abdomen).  You have large blood clumps (clots) in your poop.  Your problems are getting worse. MAKE SURE YOU:   Understand these instructions.  Will watch your condition.  Will get help right away if you are not doing well or get worse.   This information is not intended to replace advice given to you by your health care provider. Make sure you discuss any questions you have with your health care provider.   Document Released: 02/06/2008 Document Revised: 04/15/2012 Document Reviewed: 10/17/2014 Elsevier Interactive Patient Education [employer]

## 2015-06-09 NOTE — Progress Notes (Signed)
Patient seen and examined. Case d/w residents in detail. I agree with findings and plan as documented in Dr. Marily Lente note.  Patient is doing well. No new complaints. HR is now better controlled. Cardio f/u appreciated. Will cw lopressor, cardizem and digoxin for rate control of aflutter. Hg remains stable. No recurrent GI bleed.  INR is therapeutic. Will need close f/u with cardio as outpatient for INR check as well as aflutter. Will complete PO abx course with clindamycin for likely aspiration PNA. Patient is stable for d/c home today

## 2015-06-09 NOTE — Progress Notes (Addendum)
Subjective:  Patient and her husband in the room deny any chest pain, dyspnea, leg swelling, fever, or bleeding episodes. Doing well today and eager for discharge.  Objective: Vital signs in last 24 hours: Filed Vitals:   06/08/15 2200 06/09/15 0056 06/09/15 0557 06/09/15 0838  BP: 112/60 142/60 118/58 105/66  Pulse: 92  98 96  Temp:   98.3 F (36.8 C)   TempSrc:   Oral   Resp:   18   Height:      Weight:   137 lb (62.143 kg)   SpO2:   96%    Weight change: -3 lb 3.2 oz (-1.452 kg)  Intake/Output Summary (Last 24 hours) at 06/09/15 1219 Last data filed at 06/09/15 0900  Gross per 24 hour  Intake    600 ml  Output   1775 ml  Net  -1175 ml   Physical Exam General: Lying in bed, NAD HEENT: Sclerae anicteric. NCAT Cardiovascular: Loud and can be auscultated from her back.  Regular rate. Irregularly irregular rhythm with systolic murmur  Pulmonary: CTAB. Unlabored breathing Abdominal: No TTP. Normal bowel sounds Extremities: No clubbing, cyanosis, or edema Neurological: Follows commands. Speech normal.  Lab Results: Basic Metabolic Panel:  Recent Labs Lab 06/07/15 1551 06/08/15 0509 06/09/15 0438  NA  --  139 135  K  --  3.7 4.2  CL  --  107 100*  CO2  --  25 25  GLUCOSE  --  111* 101*  BUN  --  5* 8  CREATININE  --  0.51 0.61  CALCIUM  --  8.6* 9.1  MG 1.7 2.2  --    CBC:  Recent Labs Lab 06/08/15 0509 06/09/15 0438  WBC 7.1 8.1  HGB 9.6* 10.2*  HCT 28.5* 30.0*  MCV 91.1 90.6  PLT 208 250    Coagulation:  Recent Labs Lab 06/06/15 0227 06/07/15 0413 06/08/15 0509 06/09/15 0438  LABPROT 26.7* 31.7* 27.9* 30.1*  INR 2.50* 3.14* 2.65* 2.93*   Studies/Results: No results found. Medications: I have reviewed the patient's current medications. Scheduled Meds: . buPROPion  75 mg Oral q morning - 10a  . clindamycin  300 mg Oral 3 times per day  . diclofenac sodium  2 g Topical QID  . diltiazem  90 mg Oral 4 times per day  . escitalopram  20  mg Oral Daily  . metoprolol  100 mg Oral BID  . nicotine  14 mg Transdermal Daily  . pantoprazole  40 mg Oral QAC breakfast  . potassium chloride  40 mEq Oral Daily  . temazepam  30 mg Oral QHS  . Warfarin - Pharmacist Dosing Inpatient   Does not apply q1800   Continuous Infusions:   PRN Meds:.acetaminophen, ALPRAZolam, ALPRAZolam Assessment/Plan:  Gastrointestinal Bleed: Gastric erosions seen on EGD. At least one normal BM  yesterday. Hgb stable in 9-10s.  - Protonix 40 mg daily - Telemetry - Heart Healthy Diet  Hospital Acquired versus Aspiration Pneumonia: Patient noted to have developing pneumonia in RLL, which could be HAP or aspiration associated with EGD. Leukocytosis to 14 has resolved to 7's. She has remained afebrile and satting well on room air. Transition to oral abx today - Clindamycin 300 mg TID for total 10 day course (final dose 2/2) - CBC in AM  Atrial Flutter/Atrial Fibrillation with RVR: HRs improved to under 100's today. Could be driven by infection given leukocytosis, which is resolving. Cardiology is following and is comfortable with d/c today - Anticoagulation plan  per above - Metoprolol 100 mg BID - Diltiazem 90 mg TID, transition to 180 mg BID on discharge  Mechanical Mitral Valve: Restarted anticoagulation. Target INR 3-4. INR today is therapeutic at 2.93 today. - Warfarin per pharm - INR in AM  Anxiety and Depression: Patient has had intermittent episodes of agitation early in hospitalization, but it has been more controlled with family around.  - Bupropion 75 mg daily, escitalopram 20 mg daily - Alprazolam 0.5 mg daily in mornings (9am-11am); 1 mg daily prn in afternoons (12pm-4pm) - Temazepam 30 mg qhs  Tobacco Abuse: 14 mg nicotine patch  DVT Prophylaxis: SCDs, anticoagulation per above  Dispo: Anticipated discharge home with PT once INR therapeutic (3-4) for 24 hours. tachycardia resolves, and pneumonia shows continued signs of improvement.  Anticipated discharge home today.  The patient does have a current PCP Lorie Phenix, MD) and does not need an [facility] hospital follow-up appointment after discharge.  The patient does have transportation limitations that hinder transportation to clinic appointments.  .Services Needed at time of discharge: Y = Yes, Blank = No PT:   OT:   RN:   Equipment:   Other:     LOS: 6 days   Ruben Im, MD 06/09/2015, 12:19 PM

## 2015-06-14 ENCOUNTER — Ambulatory Visit (INDEPENDENT_AMBULATORY_CARE_PROVIDER_SITE_OTHER): Payer: Medicare PPO

## 2015-06-14 DIAGNOSIS — I4891 Unspecified atrial fibrillation: Secondary | ICD-10-CM | POA: Diagnosis not present

## 2015-06-14 DIAGNOSIS — Z9889 Other specified postprocedural states: Secondary | ICD-10-CM | POA: Diagnosis not present

## 2015-06-14 DIAGNOSIS — Z7901 Long term (current) use of anticoagulants: Secondary | ICD-10-CM | POA: Diagnosis not present

## 2015-06-14 DIAGNOSIS — I480 Paroxysmal atrial fibrillation: Secondary | ICD-10-CM

## 2015-06-14 DIAGNOSIS — G459 Transient cerebral ischemic attack, unspecified: Secondary | ICD-10-CM | POA: Diagnosis not present

## 2015-06-14 DIAGNOSIS — Z5181 Encounter for therapeutic drug level monitoring: Secondary | ICD-10-CM

## 2015-06-14 LAB — POCT INR: INR: 2.5

## 2015-06-15 NOTE — Progress Notes (Signed)
Cardiology Office Note:    Date:  06/16/2015   ID:  [name] [name], DOB 14-Sep-1940, MRN [id number]  PCP:  Lorie Phenix, MD  Cardiologist:  Dr. [name]   Electrophysiologist:  n/a  Chief Complaint  Patient presents with  . Hospitalization Follow-up    AFlutter in setting of GI bleed     History of Present Illness:     [name] [name] is a 75 y.o. female with a hx of mitral regurgitation s/p MV replacement with Bjork-Shiley tilting disc valve in 1980 as well as paroxysmal atrial fibrillation and history of TIA. Patient was previously followed by Dr. Juanda Chance. Last seen by Dr. [name] in 9/15. Follow up echo in 12/12 demonstrated stable mechanical mitral valve prosthesis. Carotid US 9/14 demonstrated 1-39% bilateral stenosis with when necessary follow-up recommended.   Admitted 1/21-1/27 with symptomatic upper GI bleed. INR was greater than 10 upon admission. She required transfusion with PRBCs. EGD demonstrated nonbleeding prepyloric erosions. She was treated with PPI. She was noted to be in atrial flutter upon admission with uncontrolled heart rates. Hospitalization was further complicated by HCAP. She was followed by cardiology. Rate controlling medications were adjusted and she was discharged on metoprolol tartrate 100 mg twice a day and diltiazem CD 180 mg twice a day. She was given a load of digoxin but was not discharged on digoxin. According to the notes, her heart rate was controlled at discharge. Echocardiogram was considered but not performed in the hospital. It was felt that it would be better to do this once her heart rate was better controlled.  Returns for follow-up.  Here with her husband who helps with the hx.  She is doing well. She remains weak.  She is getting stronger.  No further bleeding.  No palpitations.  No chest pain.  No significant dyspnea. No orthopnea, PND, edema.  No syncope.     Past Medical History  Diagnosis Date  . Non-occlusive  coronary artery disease 08/2005    a. non-obstructive by cath 2007.  [name] PAF (paroxysmal atrial fibrillation) (HCC)     on chronic coumadin  . Depression   . Paroxysmal atrial flutter (HCC)     s/p RFA x 2  . Mitral regurgitation     a. s/p Bjork Shiley mechanical mitral valve replacement in 1980.  [name] Gastroesophageal reflux disease   . TIA (transient ischemic attack)   . HTN (hypertension)   . Anxiety   . HLD (hyperlipidemia)   . Carotid stenosis     a. Carotid US (9/14):  Bilat 1-39% => f/u PRN  . Dementia   . Tobacco abuse   1. Mitral regurgitation s/p mitral valve replacement in 1980 with Bjork-Shiley tilting disc mechanical mitral valve. Echo (4/09) with EF 55%, well-seated mechanical mitral valve with mean gradient 6 mmHg across the valve. Echo (12/12) with EF 55-60%, mechanical mitral valve with mean gradient 4 mmHg, RV normal size/function, RV-RA gradient 38 mmHg.  2. Atrial flutter s/p atrial flutter ablation.  3. Paroxysmal atrial fibrillation.  4. TIA 5. Dementia: Moderate.  6. Active smoker 7. GERD 8. HTN 9. Depression 10. Carotid stenosis: 9/14 carotid dopplers with mild bilateral plaque.    Past Surgical History  Procedure Laterality Date  . A flutter ablation      x 2  . Mitral valve replacement      Bjork-Shiley valve placed for mitral regurgitation  . Hip arthroplasty Left 05/07/2014    Procedure: ARTHROPLASTY MONOPOLAR HIP;  Surgeon: Loraine Leriche  Becky Sax, MD;  Location: [facility] OR;  Service: Orthopedics;  Laterality: Left;  . Esophagogastroduodenoscopy N/A 06/04/2015    Procedure: ESOPHAGOGASTRODUODENOSCOPY (EGD);  Surgeon: Iva Boop, MD;  Location: [facility] ENDOSCOPY;  Service: Endoscopy;  Laterality: N/A;    Current Medications: Outpatient Prescriptions Prior to Visit  Medication Sig Dispense Refill  . ALPRAZolam (XANAX) 0.5 MG tablet TAKE 1/2 TO 1 TABLET BY MOUTH EVERY 6 HOURS AS NEEDED FOR ANXIETY 60 tablet 5  . Ascorbic Acid (VITAMIN C) 500 MG tablet Take 500  mg by mouth daily.      [name] aspirin EC 81 MG EC tablet Take 81 mg by mouth daily.      [name] atorvastatin (LIPITOR) 20 MG tablet TAKE 1 TABLET BY MOUTH ONCE A DAY 30 tablet 0  . buPROPion (WELLBUTRIN) 75 MG tablet TAKE 1 TABLET BY MOUTH EVERY MORNING 90 tablet 1  . Cyanocobalamin (VITAMIN B 12) 250 MCG LOZG Take 500 mg by mouth daily.      . diphenhydramine-acetaminophen (TYLENOL PM EXTRA STRENGTH) 25-500 MG TABS tablet Take 1 tablet by mouth at bedtime.     [name] escitalopram (LEXAPRO) 20 MG tablet TAKE 1 TABLET BY MOUTH ONCE A DAY 90 tablet 3  . lisinopril (PRINIVIL,ZESTRIL) 10 MG tablet TAKE 1 TABLET BY MOUTH ONCE A DAY 30 tablet 0  . metoprolol (LOPRESSOR) 100 MG tablet Take 1 tablet (100 mg total) by mouth 2 (two) times daily. 60 tablet 3  . Multiple Vitamins-Calcium (EQL ONE DAILY WOMENS PO) Take 1 tablet by mouth daily.      . pantoprazole (PROTONIX) 40 MG tablet Take 1 tablet (40 mg total) by mouth daily before breakfast. 30 tablet 3  . temazepam (RESTORIL) 30 MG capsule TAKE 1 CAPSULE BY MOUTH EVERY NIGHT AT BEDTIME 30 capsule 5  . warfarin (COUMADIN) 5 MG tablet Take 0.5-1 tablets (2.5-5 mg total) by mouth See admin instructions. Take 5mg  every day except on Tuesdays, Sundays and Thursdays take 2.5mg  30 tablet 3  . diltiazem (CARDIZEM) 90 MG tablet Take 2 tablets (180 mg total) by mouth 2 (two) times daily. 120 tablet 3   No facility-administered medications prior to visit.     Allergies:   Codeine; Aspirin; and Penicillins   Social History   Social History  . Marital Status: Married    Spouse Name: N/A  . Number of Children: N/A  . Years of Education: N/A   Social History Main Topics  . Smoking status: Current Every Day Smoker -- 1.00 packs/day for 35 years    Types: Cigarettes  . Smokeless tobacco: Current User  . Alcohol Use: No  . Drug Use: No  . Sexual Activity: Not Asked   Other Topics Concern  . None   Social History Narrative     Family History:  The patient's family  history includes Heart attack in her other. There is no history of Hypertension or Stroke.   ROS:   Please see the history of present illness.    Review of Systems  Cardiovascular: Positive for dyspnea on exertion.  Hematologic/Lymphatic: Bruises/bleeds easily.  Gastrointestinal: Negative for hematochezia and melena.  Genitourinary: Negative for hematuria.  Neurological: Positive for loss of balance.  Psychiatric/Behavioral: The patient is nervous/anxious.   All other systems reviewed and are negative.   Physical Exam:    VS:  BP 120/60 mmHg  Pulse 56  Ht 5\' 5"  (1.651 m)  Wt 139 lb (63.05 kg)  BMI 23.13 kg/m2   GEN: Well nourished, well  developed, in no acute distress HEENT: normal Neck: no JVD at 90 degrees, no masses Cardiac: Mechanical S1, Normal S2, RRR; 1/6 systolic murmur LSB,  no edema   Respiratory:  clear to auscultation bilaterally; no wheezing, rhonchi or rales GI: soft, nontender  MS: no deformity or atrophy Skin: warm and dry, no rash Neuro:   no focal deficits  Psych: Alert and oriented x 3, normal affect  Wt Readings from Last 3 Encounters:  06/16/15 139 lb (63.05 kg)  06/09/15 137 lb (62.143 kg)  03/27/15 142 lb (64.411 kg)      Studies/Labs Reviewed:     EKG:  EKG is  ordered today.  The ekg ordered today demonstrates atypical AFlutter, HR 52  Recent Labs: 03/27/2015: ALT 18 06/07/2015: TSH 1.984 06/08/2015: Magnesium 2.2 06/09/2015: BUN 8; Creatinine, Ser 0.61; Hemoglobin 10.2*; Platelets 250; Potassium 4.2; Sodium 135   Recent Lipid Panel    Component Value Date/Time   CHOL 157 03/27/2015 1047   CHOL 169 01/25/2014 1644   TRIG 94 03/27/2015 1047   HDL 48 03/27/2015 1047   HDL 47.50 01/25/2014 1644   CHOLHDL 3.3 03/27/2015 1047   CHOLHDL 4 01/25/2014 1644   VLDL 13.4 01/25/2014 1644   LDLCALC 90 03/27/2015 1047   LDLCALC 108* 01/25/2014 1644   LDLDIRECT 226.0 04/08/2011 1237    Additional studies/ records that were reviewed today  include:   Carotid US 9/14 Bilateral ICA 1-39% >> FU prn  Echo 12/12 Mild LVH, EF 55-65%, normal wall motion, normal diastolic function, mechanical mitral valve prosthesis okay with mean 4 mmHg, normal RV function, mildly elevated PASP consistent with mild pulmo HTN  LHC 11/07 OM1 40% RCA proximal 40% EF 60%   ASSESSMENT:     1. S/P MVR (mitral valve replacement)   2. PAF (paroxysmal atrial fibrillation) (HCC)   3. Coronary artery disease involving native coronary artery of native heart without angina pectoris   4. Hyperlipemia   5. Carotid stenosis, bilateral   6. History of GI bleed     PLAN:     In order of problems listed above:  1. Mechanical mitral valve - Continue warfarin goal INR 2.5-3.5 and ASA 81 daily. She needs Lovenox if she has to come off of Coumadin electively.  Continue SBE prophylaxis. Recent admit with UGI bleed.  This seems to have resolved.  INR last week 2.5.  Next visit with coumadin clinic in [place] next week.  Will arrange FU Echo now that her HR is better controlled.   -  She lives near [place] and prefers FU there.  Will arrange FU in our [place] clinic.   2. Paroxysmal AFib/Flutter - Recent admit with UGI bleed.  She was back in AFlutter and HR was uncontrolled. HR was better on Metoprolol 100 bid and Diltiazem 180 bid.  She remains in atypical AFlutter.  She remains on Warfarin.  At this point, I am not convinced we need to pursue DCCV.  Will get echo as noted.  Over time, if she seems to be symptomatic with AFlutter (difficult to tell given dementia), can consider DCCV.  Her HR is now in the low 50s.  Will reduce dose of her calcium channel blocker.  -  DC Diltiazem 180 bid  -  Start Cardizem CD 240 mg QD  -  Get Echo  -  Rate control for now.  Consider DCCV if she seems symptomatic at FU.   3. CAD - Non-obstructive by LHC in the past.  No  angina.  Continue ASA, statin, beta-blocker.  4. Hyperlipidemia - Continue statin.  LDL in 11/16  was 90.    5. Carotid stenosis - Bilateral ICA 1-39% in 2014.  She will need FU.  We are changing her to the [place] clinic.  Repeat Carotid US can be arranged there at next OV.    6. Hx of UGI Bleed - No apparent recurrence.  FU with PCP as planned.     Medication Adjustments/Labs and Tests Ordered: Current medicines are reviewed at length with the patient today.  Concerns regarding medicines are outlined above.  Medication changes, Labs and Tests ordered today are outlined in the Patient Instructions noted below. Patient Instructions  Medication Instructions:  1. STOP DILTIAZEM 90 MG   2. START CARDIZEM CD 240 MG DAILY; NEW RX SENT IN TODAY  Labwork: NONE  Testing/Procedures: Your physician has requested that you have an echocardiogram TO BE DONE IN [facility] OFFICE Echocardiography is a painless test that uses sound waves to create images of your heart. It provides your doctor with information about the size and shape of your heart and how well your heart's chambers and valves are working. This procedure takes approximately one hour. There are no restrictions for this procedure.  Follow-Up: FOLLOW UP WITH ONE OF THE CARDIOLOGIST IN THE [place] [facility] IN ABOUT 4-6 WEEKS  Any Other Special Instructions Will Be Listed Below (If Applicable).  If you need a refill on your cardiac medications before your next appointment, please call your pharmacy.     Signed, Tereso Newcomer, PA-C  06/16/2015 1:36 PM    [facility] [address] [place], [place], [place]  [address] Phone: [phone]; Fax: [phone]

## 2015-06-16 ENCOUNTER — Ambulatory Visit (INDEPENDENT_AMBULATORY_CARE_PROVIDER_SITE_OTHER): Payer: Medicare PPO | Admitting: Physician Assistant

## 2015-06-16 ENCOUNTER — Encounter: Payer: Self-pay | Admitting: Physician Assistant

## 2015-06-16 VITALS — BP 120/60 | HR 56 | Ht 65.0 in | Wt 139.0 lb

## 2015-06-16 DIAGNOSIS — Z8719 Personal history of other diseases of the digestive system: Secondary | ICD-10-CM

## 2015-06-16 DIAGNOSIS — I480 Paroxysmal atrial fibrillation: Secondary | ICD-10-CM | POA: Diagnosis not present

## 2015-06-16 DIAGNOSIS — I6523 Occlusion and stenosis of bilateral carotid arteries: Secondary | ICD-10-CM

## 2015-06-16 DIAGNOSIS — Z954 Presence of other heart-valve replacement: Secondary | ICD-10-CM | POA: Diagnosis not present

## 2015-06-16 DIAGNOSIS — I2510 Atherosclerotic heart disease of native coronary artery without angina pectoris: Secondary | ICD-10-CM | POA: Diagnosis not present

## 2015-06-16 DIAGNOSIS — E785 Hyperlipidemia, unspecified: Secondary | ICD-10-CM

## 2015-06-16 DIAGNOSIS — Z952 Presence of prosthetic heart valve: Secondary | ICD-10-CM

## 2015-06-16 MED ORDER — DILTIAZEM HCL ER COATED BEADS 240 MG PO CP24: 240.0000 mg | ORAL_CAPSULE | Freq: Every day | ORAL | Status: DC

## 2015-06-16 NOTE — Patient Instructions (Addendum)
Medication Instructions:  1. STOP DILTIAZEM 90 MG   2. START CARDIZEM CD 240 MG DAILY; NEW RX SENT IN TODAY  Labwork: NONE  Testing/Procedures: Your physician has requested that you have an echocardiogram TO BE DONE IN [facility] OFFICE Echocardiography is a painless test that uses sound waves to create images of your heart. It provides your doctor with information about the size and shape of your heart and how well your heart's chambers and valves are working. This procedure takes approximately one hour. There are no restrictions for this procedure.  Follow-Up: FOLLOW UP WITH ONE OF THE CARDIOLOGIST IN THE [place] [facility] IN ABOUT 4-6 WEEKS  Any Other Special Instructions Will Be Listed Below (If Applicable).  If you need a refill on your cardiac medications before your next appointment, please call your pharmacy.

## 2015-06-21 ENCOUNTER — Other Ambulatory Visit: Payer: Self-pay

## 2015-06-21 ENCOUNTER — Other Ambulatory Visit
Admission: RE | Admit: 2015-06-21 | Discharge: 2015-06-21 | Disposition: A | Discharge status: Home / Self Care | Payer: Medicare PPO | Source: Ambulatory Visit | Attending: Cardiovascular Disease | Admitting: Cardiovascular Disease

## 2015-06-21 ENCOUNTER — Ambulatory Visit (INDEPENDENT_AMBULATORY_CARE_PROVIDER_SITE_OTHER): Payer: Medicare PPO

## 2015-06-21 DIAGNOSIS — Z5181 Encounter for therapeutic drug level monitoring: Secondary | ICD-10-CM

## 2015-06-21 DIAGNOSIS — I4891 Unspecified atrial fibrillation: Secondary | ICD-10-CM | POA: Insufficient documentation

## 2015-06-21 DIAGNOSIS — Z7901 Long term (current) use of anticoagulants: Secondary | ICD-10-CM

## 2015-06-21 DIAGNOSIS — G459 Transient cerebral ischemic attack, unspecified: Secondary | ICD-10-CM | POA: Diagnosis not present

## 2015-06-21 DIAGNOSIS — Z9889 Other specified postprocedural states: Secondary | ICD-10-CM | POA: Diagnosis not present

## 2015-06-21 DIAGNOSIS — I480 Paroxysmal atrial fibrillation: Secondary | ICD-10-CM

## 2015-06-21 LAB — PROTIME-INR
INR: 4.76
PROTHROMBIN TIME: 43.3 s — AB (ref 11.4–15.0)

## 2015-06-21 LAB — POCT INR: INR: 6.6

## 2015-06-22 ENCOUNTER — Other Ambulatory Visit: Payer: Self-pay | Admitting: Cardiology

## 2015-06-22 ENCOUNTER — Other Ambulatory Visit: Payer: Self-pay

## 2015-06-22 ENCOUNTER — Ambulatory Visit (INDEPENDENT_AMBULATORY_CARE_PROVIDER_SITE_OTHER): Payer: Medicare PPO

## 2015-06-22 DIAGNOSIS — Z952 Presence of prosthetic heart valve: Secondary | ICD-10-CM

## 2015-06-22 DIAGNOSIS — Z954 Presence of other heart-valve replacement: Secondary | ICD-10-CM | POA: Diagnosis not present

## 2015-06-23 ENCOUNTER — Telehealth: Payer: Self-pay | Admitting: *Deleted

## 2015-06-23 ENCOUNTER — Encounter: Payer: Self-pay | Admitting: Physician Assistant

## 2015-06-23 NOTE — Telephone Encounter (Signed)
DPR on file for husband [name] [name]. [name] [name] has been notified of echo results and findings by phone with verbal understanding.

## 2015-06-28 ENCOUNTER — Ambulatory Visit (INDEPENDENT_AMBULATORY_CARE_PROVIDER_SITE_OTHER): Payer: Medicare PPO

## 2015-06-28 DIAGNOSIS — Z9889 Other specified postprocedural states: Secondary | ICD-10-CM | POA: Diagnosis not present

## 2015-06-28 DIAGNOSIS — I4891 Unspecified atrial fibrillation: Secondary | ICD-10-CM | POA: Diagnosis not present

## 2015-06-28 DIAGNOSIS — Z7901 Long term (current) use of anticoagulants: Secondary | ICD-10-CM

## 2015-06-28 DIAGNOSIS — Z5181 Encounter for therapeutic drug level monitoring: Secondary | ICD-10-CM

## 2015-06-28 DIAGNOSIS — I480 Paroxysmal atrial fibrillation: Secondary | ICD-10-CM

## 2015-06-28 DIAGNOSIS — G459 Transient cerebral ischemic attack, unspecified: Secondary | ICD-10-CM

## 2015-06-28 LAB — POCT INR: INR: 2

## 2015-06-29 ENCOUNTER — Other Ambulatory Visit: Payer: Self-pay | Admitting: Cardiology

## 2015-07-05 ENCOUNTER — Ambulatory Visit (INDEPENDENT_AMBULATORY_CARE_PROVIDER_SITE_OTHER): Payer: Medicare PPO | Admitting: *Deleted

## 2015-07-05 DIAGNOSIS — Z7901 Long term (current) use of anticoagulants: Secondary | ICD-10-CM | POA: Diagnosis not present

## 2015-07-05 DIAGNOSIS — Z9889 Other specified postprocedural states: Secondary | ICD-10-CM | POA: Diagnosis not present

## 2015-07-05 DIAGNOSIS — Z5181 Encounter for therapeutic drug level monitoring: Secondary | ICD-10-CM | POA: Diagnosis not present

## 2015-07-05 DIAGNOSIS — I480 Paroxysmal atrial fibrillation: Secondary | ICD-10-CM

## 2015-07-05 DIAGNOSIS — I4891 Unspecified atrial fibrillation: Secondary | ICD-10-CM

## 2015-07-05 DIAGNOSIS — G459 Transient cerebral ischemic attack, unspecified: Secondary | ICD-10-CM

## 2015-07-05 LAB — POCT INR: INR: 6.2

## 2015-07-12 ENCOUNTER — Ambulatory Visit (INDEPENDENT_AMBULATORY_CARE_PROVIDER_SITE_OTHER): Payer: Medicare PPO

## 2015-07-12 DIAGNOSIS — G459 Transient cerebral ischemic attack, unspecified: Secondary | ICD-10-CM | POA: Diagnosis not present

## 2015-07-12 DIAGNOSIS — Z9889 Other specified postprocedural states: Secondary | ICD-10-CM

## 2015-07-12 DIAGNOSIS — I4891 Unspecified atrial fibrillation: Secondary | ICD-10-CM

## 2015-07-12 DIAGNOSIS — Z7901 Long term (current) use of anticoagulants: Secondary | ICD-10-CM | POA: Diagnosis not present

## 2015-07-12 DIAGNOSIS — Z5181 Encounter for therapeutic drug level monitoring: Secondary | ICD-10-CM | POA: Diagnosis not present

## 2015-07-12 DIAGNOSIS — I480 Paroxysmal atrial fibrillation: Secondary | ICD-10-CM

## 2015-07-12 LAB — POCT INR: INR: 3.9

## 2015-07-20 ENCOUNTER — Encounter: Payer: Self-pay | Admitting: Cardiology

## 2015-07-20 ENCOUNTER — Ambulatory Visit (INDEPENDENT_AMBULATORY_CARE_PROVIDER_SITE_OTHER): Payer: Medicare PPO | Admitting: Cardiology

## 2015-07-20 ENCOUNTER — Ambulatory Visit (INDEPENDENT_AMBULATORY_CARE_PROVIDER_SITE_OTHER): Payer: Medicare PPO

## 2015-07-20 VITALS — BP 120/58 | HR 61 | Ht 66.0 in | Wt 137.8 lb

## 2015-07-20 DIAGNOSIS — I2510 Atherosclerotic heart disease of native coronary artery without angina pectoris: Secondary | ICD-10-CM | POA: Diagnosis not present

## 2015-07-20 DIAGNOSIS — Z952 Presence of prosthetic heart valve: Secondary | ICD-10-CM

## 2015-07-20 DIAGNOSIS — I10 Essential (primary) hypertension: Secondary | ICD-10-CM | POA: Diagnosis not present

## 2015-07-20 DIAGNOSIS — I4891 Unspecified atrial fibrillation: Secondary | ICD-10-CM

## 2015-07-20 DIAGNOSIS — Z954 Presence of other heart-valve replacement: Secondary | ICD-10-CM

## 2015-07-20 DIAGNOSIS — Z9889 Other specified postprocedural states: Secondary | ICD-10-CM | POA: Diagnosis not present

## 2015-07-20 DIAGNOSIS — I480 Paroxysmal atrial fibrillation: Secondary | ICD-10-CM

## 2015-07-20 DIAGNOSIS — G459 Transient cerebral ischemic attack, unspecified: Secondary | ICD-10-CM

## 2015-07-20 DIAGNOSIS — Z7901 Long term (current) use of anticoagulants: Secondary | ICD-10-CM | POA: Diagnosis not present

## 2015-07-20 DIAGNOSIS — I6522 Occlusion and stenosis of left carotid artery: Secondary | ICD-10-CM

## 2015-07-20 DIAGNOSIS — I350 Nonrheumatic aortic (valve) stenosis: Secondary | ICD-10-CM

## 2015-07-20 DIAGNOSIS — E785 Hyperlipidemia, unspecified: Secondary | ICD-10-CM

## 2015-07-20 DIAGNOSIS — Z5181 Encounter for therapeutic drug level monitoring: Secondary | ICD-10-CM

## 2015-07-20 DIAGNOSIS — R0989 Other specified symptoms and signs involving the circulatory and respiratory systems: Secondary | ICD-10-CM

## 2015-07-20 LAB — POCT INR: INR: 3.9

## 2015-07-20 NOTE — Progress Notes (Signed)
Pls inform coumadin clinic to change INR therapeutic range to 2.5 to 3.5. Thank you.

## 2015-07-20 NOTE — Progress Notes (Addendum)
Cardiology Office Note   Date:  07/20/2015   ID:  [name], DOB Apr 26, 1941, MRN [id number]  Referring Doctor:  Lorie Phenix, MD   Cardiologist:   Almond Lint, MD   Reason for consultation:  Chief Complaint  Patient presents with  . other    Afib. Meds reviewed verbally with pt.      History of Present Illness: [name] [name] is a 75 y.o. female who presents for ffup for:  - afib -- pt Does not really complain of palpitations or sensation of change in rhythm. Her EKG today, she is back in sinus rhythm, heart rate is controlled. Patient tolerated may change on last visit.  -- History of mechanical mitral valve -- patient continues to follow up with Coumadin clinic, no recurrence of bleeding  -- CAD history--patient denies chest pain, shortness of breath. Patient has very limited functional capacity. She stays at home with husband. She has dementia.  --Hypertension--blood pressure has remained stable at home, no complaints  --Hyperlipidemia--patient continues to take her Lipitor  Patient denies headache, cough, colds, chest pain, shortness of breath, palpitations, loss of consciousness, bleeding, abdominal pain. Main complaint is hip pain which is chronic.  ROS:  Please see the history of present illness. Aside from mentioned under HPI, all other systems are reviewed and negative.     Past Medical History  Diagnosis Date  . Non-occlusive coronary artery disease 08/2005    a. non-obstructive by cath 2007.  [name] PAF (paroxysmal atrial fibrillation) (HCC)     on chronic coumadin  . Depression   . Paroxysmal atrial flutter (HCC)     s/p RFA x 2  . Mitral regurgitation     a. s/p Bjork Shiley mechanical mitral valve replacement in 1980.  [name] Gastroesophageal reflux disease   . TIA (transient ischemic attack)   . HTN (hypertension)   . Anxiety   . HLD (hyperlipidemia)   . Carotid stenosis     a. Carotid US (9/14):  Bilat 1-39% => f/u PRN  . Dementia   .  Tobacco abuse   . History of echocardiogram     Echo 2/17: mild LVH, EF 55-60%, mild to mod AS (mean 14 mmHg), mechanical MVR ok (mean 7 mmHgg), mild LAE, PASP 58 mmHg    Past Surgical History  Procedure Laterality Date  . A flutter ablation      x 2  . Mitral valve replacement      Bjork-Shiley valve placed for mitral regurgitation  . Hip arthroplasty Left 05/07/2014    Procedure: ARTHROPLASTY MONOPOLAR HIP;  Surgeon: Eldred Manges, MD;  Location: [facility] OR;  Service: Orthopedics;  Laterality: Left;  . Esophagogastroduodenoscopy N/A 06/04/2015    Procedure: ESOPHAGOGASTRODUODENOSCOPY (EGD);  Surgeon: Iva Boop, MD;  Location: [facility] ENDOSCOPY;  Service: Endoscopy;  Laterality: N/A;     reports that she has been smoking Cigarettes.  She has a 35 pack-year smoking history. She uses smokeless tobacco. She reports that she does not drink alcohol or use illicit drugs.   family history includes Heart attack in her other. There is no history of Hypertension or Stroke.   Current Outpatient Prescriptions  Medication Sig Dispense Refill  . ALPRAZolam (XANAX) 0.5 MG tablet TAKE 1/2 TO 1 TABLET BY MOUTH EVERY 6 HOURS AS NEEDED FOR ANXIETY 60 tablet 5  . Ascorbic Acid (VITAMIN C) 500 MG tablet Take 500 mg by mouth daily.      [name] aspirin EC 81 MG EC tablet  Take 81 mg by mouth daily.      [name] atorvastatin (LIPITOR) 20 MG tablet TAKE 1 TABLET BY MOUTH ONCE A DAY 90 tablet 3  . buPROPion (WELLBUTRIN) 75 MG tablet TAKE 1 TABLET BY MOUTH EVERY MORNING 90 tablet 1  . Cyanocobalamin (VITAMIN B 12) 250 MCG LOZG Take 500 mg by mouth daily.      [name] diltiazem (CARDIZEM CD) 240 MG 24 hr capsule Take 1 capsule (240 mg total) by mouth daily. 90 capsule 3  . diphenhydramine-acetaminophen (TYLENOL PM EXTRA STRENGTH) 25-500 MG TABS tablet Take 1 tablet by mouth at bedtime.     [name] escitalopram (LEXAPRO) 20 MG tablet TAKE 1 TABLET BY MOUTH ONCE A DAY 90 tablet 3  . lisinopril (PRINIVIL,ZESTRIL) 10 MG tablet TAKE 1 TABLET BY  MOUTH ONCE A DAY 30 tablet 11  . metoprolol (LOPRESSOR) 100 MG tablet Take 1 tablet (100 mg total) by mouth 2 (two) times daily. 60 tablet 3  . Multiple Vitamins-Calcium (EQL ONE DAILY WOMENS PO) Take 1 tablet by mouth daily.      . pantoprazole (PROTONIX) 40 MG tablet Take 1 tablet (40 mg total) by mouth daily before breakfast. 30 tablet 3  . temazepam (RESTORIL) 30 MG capsule TAKE 1 CAPSULE BY MOUTH EVERY NIGHT AT BEDTIME 30 capsule 5  . warfarin (COUMADIN) 5 MG tablet Take 0.5-1 tablets (2.5-5 mg total) by mouth See admin instructions. Take  every day except on Tuesdays, Sundays and Thursdays take 2.5mg  30 tablet 3   No current facility-administered medications for this visit.    Allergies: Codeine; Aspirin; and Penicillins    PHYSICAL EXAM: VS:  BP 120/58 mmHg  Pulse 61  Ht  (1.676 m)  Wt 137 lb 12 oz (62.483 kg)  BMI 22.24 kg/m2 , Body mass index is 22.24 kg/(m^2). Wt Readings from Last 3 Encounters:  07/20/15 137 lb 12 oz (62.483 kg)  06/16/15 139 lb (63.05 kg)  06/09/15 137 lb (62.143 kg)    GENERAL:  well developed, well nourished, not in acute distress HEENT: normocephalic, pink conjunctivae, anicteric sclerae, no xanthelasma, normal dentition, oropharynx clear NECK:  no neck vein engorgement, JVP normal, no hepatojugular reflux, Positive for bruit on left side, no thyromegaly, no lymphadenopathy LUNGS:  good respiratory effort, clear to auscultation bilaterally CV:  PMI not displaced, no thrills, no lifts, S1 and S2 within normal limits, no palpable S3 or S4, no rubs, no gallops, 3/6 systolic ejection murmur, minimally radiating up to the neck, no delay in radial artery pulse or carotid pulse ABD:  Soft, nontender, nondistended, normoactive bowel sounds, no abdominal aortic bruit, no hepatomegaly, no splenomegaly MS: nontender back, no kyphosis, no scoliosis, no joint deformities EXT:  2+ DP/PT pulses, no edema, no varicosities, no cyanosis, no clubbing SKIN: warm,  nondiaphoretic, normal turgor, no ulcers NEUROPSYCH: alert, oriented to person only sensory/motor grossly intact, normal mood, flat affect  Recent Labs: 03/27/2015: ALT 18 06/07/2015: TSH 1.984 06/08/2015: Magnesium 2.2 06/09/2015: BUN 8; Creatinine, Ser 0.61; Hemoglobin 10.2*; Platelets 250; Potassium 4.2; Sodium 135   Lipid Panel    Component Value Date/Time   CHOL 157 03/27/2015 1047   CHOL 169 01/25/2014 1644   TRIG 94 03/27/2015 1047   HDL 48 03/27/2015 1047   HDL 47.50 01/25/2014 1644   CHOLHDL 3.3 03/27/2015 1047   CHOLHDL 4 01/25/2014 1644   VLDL 13.4 01/25/2014 1644   LDLCALC 90 03/27/2015 1047   LDLCALC 108* 01/25/2014 1644   LDLDIRECT 226.0 04/08/2011 1237  Other studies Reviewed:  EKG:  EKG is ordered today.07/20/2015  The ekg ordered today was personally reviewed by me and it reveals appear to be sinus rhythm, 61 BPM, PACs. Poor R-wave progression.   Additional studies/ records that were reviewed personally reviewed by me today include:   Echo 06/22/2015: Mild concentric hypertrophy. LVEF 55-60%. Mild to moderate AS, mean gradient 14 mmHg, valve area by VTI 1.15 cm. Mechanical mitral valve prosthesis present and functioning normally. Mean gradient 7 mmHg. LA mildly dilated. PA pressure 58 mmHg.  ASSESSMENT AND PLAN:  - s/p  Mechanical mitral valve - Continue warfarin goal INR 2.5-3.5 and ASA 81 daily. She needs Lovenox if she has to come off of Coumadin electively. Continue SBE prophylaxis. Recent admit with UGI bleed. This seems to have resolved. Continue follow-up with coumadin clinic.  -Paroxysmal AFib/Flutter - appears to be back in sinus rhythm. Asymptomatic by history. She remains on Warfarin. Patient has limited functional capacity, mainly due to her age as well as her dementia. Continue Cardizem CD 240 mg daily, and metoprolol 100 mg twice a day.  - CAD - Non-obstructive by LHC in the past. No angina. Continue ASA, statin,  beta-blocker.  -Aortic stenosis - mild to moderate in severity. Continue to monitor. Patient will be poor surgical candidate.  - Hypertension-BP is well controlled. Continue monitoring BP. Continue current medical therapy and lifestyle changes.  -Hyperlipidemia - Continue statin. LDL in 11/16 was 90.   - carotid stenosis - Bilateral ICA 1-39% in 2014. Recommend carotid artery duplex bilateral.   Current medicines are reviewed at length with the patient today.  The patient does not have concerns regarding medicines.  Labs/ tests ordered today include:  Orders Placed This Encounter  Procedures  . EKG 12-Lead    I had a lengthy and detailed discussion with the patient regarding diagnoses, prognosis, diagnostic options, treatment options, and side effects of medications.   I counseled the patient on importance of lifestyle modification including heart healthy diet, regular physical activity  Disposition:   FU with undersigned in 3 months Signed, Almond LintAileen Dionte Blaustein, MD  07/20/2015 3:50 PM    [facility]

## 2015-07-20 NOTE — Patient Instructions (Signed)
Medication Instructions:  Your physician recommends that you continue on your current medications as directed. Please refer to the Current Medication list given to you today.   Labwork: None ordered  Testing/Procedures: Your physician has requested that you have a carotid duplex. This test is an ultrasound of the carotid arteries in your neck. It looks at blood flow through these arteries that supply the brain with blood. Allow one hour for this exam. There are no restrictions or special instructions.  Date & Time: _______________________________________________________  Follow-Up: Your physician recommends that you schedule a follow-up appointment in: 3 months with Dr. Alvino ChapelIngal.  Date & Time:_______________________________________________________   Any Other Special Instructions Will Be Listed Below (If Applicable).     If you need a refill on your cardiac medications before your next appointment, please call your pharmacy.

## 2015-07-21 ENCOUNTER — Other Ambulatory Visit: Payer: Self-pay | Admitting: *Deleted

## 2015-07-21 ENCOUNTER — Telehealth: Payer: Self-pay | Admitting: Cardiology

## 2015-07-21 NOTE — Telephone Encounter (Signed)
Dr. Alvino ChapelIngal wants to change patients coumadin goal range from 3 to 4 to 2.5 to 3.5. [facility] [place]. Will route this note to both [facility] and Dr. Alvino ChapelIngal.

## 2015-07-26 ENCOUNTER — Ambulatory Visit (INDEPENDENT_AMBULATORY_CARE_PROVIDER_SITE_OTHER): Payer: Medicare PPO

## 2015-07-26 DIAGNOSIS — Z5181 Encounter for therapeutic drug level monitoring: Secondary | ICD-10-CM

## 2015-07-26 DIAGNOSIS — G459 Transient cerebral ischemic attack, unspecified: Secondary | ICD-10-CM | POA: Diagnosis not present

## 2015-07-26 DIAGNOSIS — I480 Paroxysmal atrial fibrillation: Secondary | ICD-10-CM | POA: Diagnosis not present

## 2015-07-26 LAB — POCT INR: INR: 3.2

## 2015-08-04 ENCOUNTER — Other Ambulatory Visit: Payer: Self-pay | Admitting: Family Medicine

## 2015-08-04 DIAGNOSIS — G4700 Insomnia, unspecified: Secondary | ICD-10-CM

## 2015-08-04 NOTE — Telephone Encounter (Signed)
Printed, please fax or call in to pharmacy. Thank you.   

## 2015-08-07 ENCOUNTER — Other Ambulatory Visit: Payer: Self-pay | Admitting: Family Medicine

## 2015-08-07 DIAGNOSIS — G4700 Insomnia, unspecified: Secondary | ICD-10-CM

## 2015-08-07 NOTE — Telephone Encounter (Signed)
Printed, please fax or call in to pharmacy. Thank you.   

## 2015-08-09 ENCOUNTER — Ambulatory Visit (INDEPENDENT_AMBULATORY_CARE_PROVIDER_SITE_OTHER): Payer: Medicare PPO

## 2015-08-09 ENCOUNTER — Ambulatory Visit: Payer: Medicare PPO

## 2015-08-09 DIAGNOSIS — Z9889 Other specified postprocedural states: Secondary | ICD-10-CM

## 2015-08-09 DIAGNOSIS — Z7901 Long term (current) use of anticoagulants: Secondary | ICD-10-CM

## 2015-08-09 DIAGNOSIS — I480 Paroxysmal atrial fibrillation: Secondary | ICD-10-CM

## 2015-08-09 DIAGNOSIS — Z5181 Encounter for therapeutic drug level monitoring: Secondary | ICD-10-CM | POA: Diagnosis not present

## 2015-08-09 DIAGNOSIS — G459 Transient cerebral ischemic attack, unspecified: Secondary | ICD-10-CM

## 2015-08-09 DIAGNOSIS — I4891 Unspecified atrial fibrillation: Secondary | ICD-10-CM | POA: Diagnosis not present

## 2015-08-09 DIAGNOSIS — I6523 Occlusion and stenosis of bilateral carotid arteries: Secondary | ICD-10-CM | POA: Diagnosis not present

## 2015-08-09 DIAGNOSIS — R0989 Other specified symptoms and signs involving the circulatory and respiratory systems: Secondary | ICD-10-CM

## 2015-08-09 LAB — POCT INR: INR: 4.7

## 2015-08-23 ENCOUNTER — Ambulatory Visit (INDEPENDENT_AMBULATORY_CARE_PROVIDER_SITE_OTHER): Payer: Medicare PPO

## 2015-08-23 ENCOUNTER — Encounter: Payer: Self-pay | Admitting: Cardiology

## 2015-08-23 ENCOUNTER — Ambulatory Visit (INDEPENDENT_AMBULATORY_CARE_PROVIDER_SITE_OTHER): Payer: Medicare PPO | Admitting: Cardiology

## 2015-08-23 VITALS — BP 146/54 | HR 61 | Ht 66.0 in | Wt 139.0 lb

## 2015-08-23 DIAGNOSIS — Z5181 Encounter for therapeutic drug level monitoring: Secondary | ICD-10-CM | POA: Diagnosis not present

## 2015-08-23 DIAGNOSIS — I6523 Occlusion and stenosis of bilateral carotid arteries: Secondary | ICD-10-CM | POA: Diagnosis not present

## 2015-08-23 DIAGNOSIS — Z7901 Long term (current) use of anticoagulants: Secondary | ICD-10-CM | POA: Diagnosis not present

## 2015-08-23 DIAGNOSIS — I350 Nonrheumatic aortic (valve) stenosis: Secondary | ICD-10-CM

## 2015-08-23 DIAGNOSIS — I480 Paroxysmal atrial fibrillation: Secondary | ICD-10-CM | POA: Diagnosis not present

## 2015-08-23 DIAGNOSIS — I4891 Unspecified atrial fibrillation: Secondary | ICD-10-CM

## 2015-08-23 DIAGNOSIS — G459 Transient cerebral ischemic attack, unspecified: Secondary | ICD-10-CM | POA: Diagnosis not present

## 2015-08-23 DIAGNOSIS — R9430 Abnormal result of cardiovascular function study, unspecified: Secondary | ICD-10-CM

## 2015-08-23 DIAGNOSIS — Z9889 Other specified postprocedural states: Secondary | ICD-10-CM

## 2015-08-23 DIAGNOSIS — I10 Essential (primary) hypertension: Secondary | ICD-10-CM | POA: Diagnosis not present

## 2015-08-23 LAB — POCT INR: INR: 2.8

## 2015-08-23 NOTE — Patient Instructions (Signed)
Medication Instructions:  Your physician recommends that you continue on your current medications as directed. Please refer to the Current Medication list given to you today.   Labwork: INR check today.   Testing/Procedures: None ordered  Follow-Up: Your physician wants you to follow-up in: 6 months with Dr. Alvino ChapelIngal. You will receive a reminder letter in the mail two months in advance. If you don't receive a letter, please call our office to schedule the follow-up appointment.   Any Other Special Instructions Will Be Listed Below (If Applicable).     If you need a refill on your cardiac medications before your next appointment, please call your pharmacy.

## 2015-08-23 NOTE — Progress Notes (Addendum)
Cardiology Office Note   Date:  08/23/2015   ID:  [name], DOB [birth year], MRN [id number]  Referring Doctor:  [name] Phenix, MD   Cardiologist:   [name] Lint, MD   Reason for consultation:  Chief Complaint  Patient presents with  . other    Follow up from carotid u/s. Meds reviewed by the patient verbally.       History of Present Illness: [name] [name] is a 75 y.o. female who presents for ffup After carotid ultrasound, to discuss results.  - afib -- pt Does not really complain of palpitations or sensation of change in rhythm.  -- History of mechanical mitral valve -- patient continues to follow up with Coumadin clinic, no recurrence of bleeding. Husband says that the INR targets were 3-4 and she was found to have mini strokes despite target of 2.5-3.5. He is requesting that the INR target be changed.  Carotid ultrasound shows stable disease in bilateral carotids however elevated velocities were found from the left subclavian artery. This was discussed in detail with the husband and patient.  Patient denies headache, cough, colds, chest pain, shortness of breath, palpitations, loss of consciousness, bleeding, abdominal pain. Main complaint is hip pain which is chronic.  ROS:  Please see the history of present illness. Aside from mentioned under HPI, all other systems are reviewed and negative.     Past Medical History  Diagnosis Date  . Non-occlusive coronary artery disease 08/2005    a. non-obstructive by cath 2007.  [name] [name] PAF (paroxysmal atrial fibrillation) (HCC)     on chronic coumadin  . Depression   . Paroxysmal atrial flutter (HCC)     s/p RFA x 2  . Mitral regurgitation     a. s/p Bjork Shiley mechanical mitral valve replacement in 1980.  [name] [name] Gastroesophageal reflux disease   . TIA (transient ischemic attack)   . HTN (hypertension)   . Anxiety   . HLD (hyperlipidemia)   . Carotid stenosis     a. Carotid US (9/14):  Bilat 1-39% => f/u PRN  .  Dementia   . Tobacco abuse   . History of echocardiogram     Echo 2/17: mild LVH, EF 55-60%, mild to mod AS (mean 14 mmHg), mechanical MVR ok (mean 7 mmHgg), mild LAE, PASP 58 mmHg    Past Surgical History  Procedure Laterality Date  . A flutter ablation      x 2  . Mitral valve replacement      Bjork-Shiley valve placed for mitral regurgitation  . Hip arthroplasty Left 05/07/2014    Procedure: ARTHROPLASTY MONOPOLAR HIP;  Surgeon: [name] Manges, MD;  Location: [facility] OR;  Service: Orthopedics;  Laterality: Left;  . Esophagogastroduodenoscopy N/A 06/04/2015    Procedure: ESOPHAGOGASTRODUODENOSCOPY (EGD);  Surgeon: [name] Boop, MD;  Location: [facility] ENDOSCOPY;  Service: Endoscopy;  Laterality: N/A;     reports that she has been smoking Cigarettes.  She has a 35 pack-year smoking history. She uses smokeless tobacco. She reports that she does not drink alcohol or use illicit drugs.   family history includes Heart attack in her other. There is no history of Hypertension or Stroke.   Current Outpatient Prescriptions  Medication Sig Dispense Refill  . ALPRAZolam (XANAX) 0.5 MG tablet TAKE 1/2 TO 1 TABLET BY MOUTH EVERY 6 HOURS AS NEEDED FOR ANXIETY 60 tablet 5  . Ascorbic Acid (VITAMIN C) 500 MG tablet Take 500 mg by mouth daily.      [name] [name]  aspirin EC 81 MG EC tablet Take 81 mg by mouth daily.      [name] [name] atorvastatin (LIPITOR) 20 MG tablet TAKE 1 TABLET BY MOUTH ONCE A DAY 90 tablet 3  . buPROPion (WELLBUTRIN) 75 MG tablet TAKE 1 TABLET BY MOUTH EVERY MORNING 90 tablet 1  . Cyanocobalamin (VITAMIN B 12) 250 MCG LOZG Take 500 mg by mouth daily.      [name] [name] diltiazem (CARDIZEM CD) 240 MG 24 hr capsule Take 1 capsule (240 mg total) by mouth daily. 90 capsule 3  . diphenhydramine-acetaminophen (TYLENOL PM EXTRA STRENGTH) 25-500 MG TABS tablet Take 1 tablet by mouth at bedtime.     [name] [name] escitalopram (LEXAPRO) 20 MG tablet TAKE 1 TABLET BY MOUTH ONCE A DAY 90 tablet 3  . lisinopril (PRINIVIL,ZESTRIL) 10 MG tablet TAKE  1 TABLET BY MOUTH ONCE A DAY 30 tablet 11  . metoprolol (LOPRESSOR) 100 MG tablet Take 1 tablet (100 mg total) by mouth 2 (two) times daily. 60 tablet 3  . Multiple Vitamins-Calcium (EQL ONE DAILY WOMENS PO) Take 1 tablet by mouth daily.      . pantoprazole (PROTONIX) 40 MG tablet Take 1 tablet (40 mg total) by mouth daily before breakfast. 30 tablet 3  . temazepam (RESTORIL) 30 MG capsule TAKE 1 CAPSULE BY MOUTH EVERY NIGHT AT BEDTIME 90 capsule 1  . warfarin (COUMADIN) 5 MG tablet Take 0.5-1 tablets (2.5-5 mg total) by mouth See admin instructions. Take  every day except on Tuesdays, Sundays and Thursdays take 2.5mg  30 tablet 3   No current facility-administered medications for this visit.    Allergies: Codeine; Aspirin; and Penicillins    PHYSICAL EXAM: VS:  BP 146/54 mmHg  Pulse 61  Ht  (1.676 m)  Wt 139 lb (63.05 kg)  BMI 22.45 kg/m2  SpO2 97% , Body mass index is 22.45 kg/(m^2). Wt Readings from Last 3 Encounters:  08/23/15 139 lb (63.05 kg)  07/20/15 137 lb 12 oz (62.483 kg)  06/16/15 139 lb (63.05 kg)    GENERAL:  well developed, well nourished, not in acute distress HEENT: normocephalic, pink conjunctivae, anicteric sclerae, no xanthelasma, normal dentition, oropharynx clear NECK:  no neck vein engorgement, JVP normal, no hepatojugular reflux, Positive for bruit on left side, no thyromegaly, no lymphadenopathy LUNGS:  good respiratory effort, clear to auscultation bilaterally CV:  PMI not displaced, no thrills, no lifts, S2 within normal limits, no palpable S3 or S4, no rubs, no gallops, 3/6 systolic ejection murmur, minimally radiating up to the neck, no delay in radial artery pulse or carotid pulse. Bilateral radial artery pulses +2 ABD:  Soft, nontender, nondistended, normoactive bowel sounds, no abdominal aortic bruit, no hepatomegaly, no splenomegaly MS: nontender back, no kyphosis, no scoliosis, no joint deformities EXT:  2+ DP/PT pulses, no edema, no  varicosities, no cyanosis, no clubbing SKIN: warm, nondiaphoretic, normal turgor, no ulcers NEUROPSYCH: alert, oriented to person only sensory/motor grossly intact, normal mood, flat affect  Recent Labs: 03/27/2015: ALT 18 06/07/2015: TSH 1.984 06/08/2015: Magnesium 2.2 06/09/2015: BUN 8; Creatinine, Ser 0.61; Hemoglobin 10.2*; Platelets 250; Potassium 4.2; Sodium 135   Lipid Panel    Component Value Date/Time   CHOL 157 03/27/2015 1047   CHOL 169 01/25/2014 1644   TRIG 94 03/27/2015 1047   HDL 48 03/27/2015 1047   HDL 47.50 01/25/2014 1644   CHOLHDL 3.3 03/27/2015 1047   CHOLHDL 4 01/25/2014 1644   VLDL 13.4 01/25/2014 1644   LDLCALC 90 03/27/2015 1047   LDLCALC  108* 01/25/2014 1644   LDLDIRECT 226.0 04/08/2011 1237     Other studies Reviewed:  EKG:   EKG from 08/23/2015 was personally reviewed by me and it showed A. fib 61 BPM. Nonspecific ST changes similar to before.  The ekg From 07/20/2015 was personally reviewed by me and it reveals appear to be sinus rhythm, 61 BPM, PACs. Poor R-wave progression.   Additional studies/ records that were reviewed personally reviewed by me today include:   Echo 06/22/2015: Mild concentric hypertrophy. LVEF 55-60%. Mild to moderate AS, mean gradient 14 mmHg, valve area by VTI 1.15 cm. Mechanical mitral valve prosthesis present and functioning normally. Mean gradient 7 mmHg. LA mildly dilated. PA pressure 58 mmHg.  Carotid ultrasound 08/09/2015: Heterogeneous plaque, bilaterally. Stable 1-39% ICA stenosis, bilaterally. > 50% RECA stenosis. Patent vertebral arteries with antegrade flow. Normal right subclavian artery. Elevated velocities left subclavian artery.  ASSESSMENT AND PLAN:  Abnormal cardiovascular test results /Elevated velocities of the left subclavian artery noted on carotid ultrasound Findings of the carotid ultrasound from 08/09/2015 were discussed with patient. These are also noted on previous ultrasounds. Patient has  equal pulses on both radial arteries. Recommend that the CT a of the left subclavian artery may be done for further evaluation. Husband and patient declined to proceed with this.   - s/p  Mechanical mitral valve - Continue warfarin goal INR 3-4 as per husband's request and ASA 81 daily. Husband mentions that that goal was to increase many years ago when patient was found to have mini strokes on the usual INR goal. She needs Lovenox if she has to come off of Coumadin electively. Continue SBE prophylaxis. Recent admit with UGI bleed. This seems to have resolved. Continue follow-up with coumadin clinic.  -Paroxysmal AFib/Flutter -  Asymptomatic by history. She remains on Warfarin. Patient has limited functional capacity, mainly due to her age as well as her dementia. Continue Cardizem CD 240 mg daily, and metoprolol 100 mg twice a day.  - CAD - Non-obstructive by LHC in the past. No angina. Continue ASA, statin, beta-blocker.  -Aortic stenosis - mild to moderate in severity. Continue to monitor. Patient will be poor surgical candidate.  - Hypertension-BP is well controlled. Continue monitoring BP. Continue current medical therapy and lifestyle changes.  -Hyperlipidemia - Continue statin. LDL in 11/16 was 90.   - carotid stenosis - Bilateral ICA 1-39%, stable. Continue medical therapy.  Current medicines are reviewed at length with the patient today.  The patient does not have concerns regarding medicines.  Labs/ tests ordered today include:  Orders Placed This Encounter  Procedures  . EKG 12-Lead    I had a lengthy and detailed discussion with the patient regarding diagnoses, prognosis, diagnostic options, treatment options, and side effects of medications.   I counseled the patient on importance of lifestyle modification including heart healthy diet, regular physical activity  Disposition:   FU with undersigned in 6 months Signed, [name] LintAileen Mega Kinkade, MD  08/23/2015 10:37 AM     [facility]

## 2015-08-29 ENCOUNTER — Other Ambulatory Visit: Payer: Self-pay | Admitting: Family Medicine

## 2015-08-29 DIAGNOSIS — F32A Depression, unspecified: Secondary | ICD-10-CM

## 2015-08-29 DIAGNOSIS — F329 Major depressive disorder, single episode, unspecified: Secondary | ICD-10-CM

## 2015-09-06 ENCOUNTER — Telehealth: Payer: Self-pay | Admitting: Cardiology

## 2015-09-06 ENCOUNTER — Ambulatory Visit (INDEPENDENT_AMBULATORY_CARE_PROVIDER_SITE_OTHER): Payer: Medicare PPO

## 2015-09-06 ENCOUNTER — Encounter: Payer: Self-pay | Admitting: Cardiology

## 2015-09-06 DIAGNOSIS — Z5181 Encounter for therapeutic drug level monitoring: Secondary | ICD-10-CM

## 2015-09-06 DIAGNOSIS — Z7901 Long term (current) use of anticoagulants: Secondary | ICD-10-CM | POA: Diagnosis not present

## 2015-09-06 DIAGNOSIS — I4891 Unspecified atrial fibrillation: Secondary | ICD-10-CM | POA: Diagnosis not present

## 2015-09-06 DIAGNOSIS — G459 Transient cerebral ischemic attack, unspecified: Secondary | ICD-10-CM

## 2015-09-06 DIAGNOSIS — I480 Paroxysmal atrial fibrillation: Secondary | ICD-10-CM

## 2015-09-06 DIAGNOSIS — Z9889 Other specified postprocedural states: Secondary | ICD-10-CM | POA: Diagnosis not present

## 2015-09-06 LAB — POCT INR: INR: 2.4

## 2015-09-06 NOTE — Telephone Encounter (Signed)
Left detailed voicemail message regarding request for Jury service excuse letter for patient. Patient came in today for coumadin check and her spouse requested that we write a letter to have him excused from jury duty based on being a primary caregiver for a disabled immediate family member. Left voicemail instructions that they would need to obtain that letter from her primary care provider and to call back if any further questions. Jury letter placed in "save" bin on [employer]'s desk.

## 2015-09-20 ENCOUNTER — Ambulatory Visit (INDEPENDENT_AMBULATORY_CARE_PROVIDER_SITE_OTHER): Payer: Medicare PPO

## 2015-09-20 DIAGNOSIS — Z9889 Other specified postprocedural states: Secondary | ICD-10-CM

## 2015-09-20 DIAGNOSIS — Z5181 Encounter for therapeutic drug level monitoring: Secondary | ICD-10-CM | POA: Diagnosis not present

## 2015-09-20 DIAGNOSIS — I4891 Unspecified atrial fibrillation: Secondary | ICD-10-CM

## 2015-09-20 DIAGNOSIS — G459 Transient cerebral ischemic attack, unspecified: Secondary | ICD-10-CM | POA: Diagnosis not present

## 2015-09-20 DIAGNOSIS — Z7901 Long term (current) use of anticoagulants: Secondary | ICD-10-CM | POA: Diagnosis not present

## 2015-09-20 DIAGNOSIS — I480 Paroxysmal atrial fibrillation: Secondary | ICD-10-CM

## 2015-09-20 LAB — POCT INR: INR: 3.6

## 2015-09-22 ENCOUNTER — Ambulatory Visit (INDEPENDENT_AMBULATORY_CARE_PROVIDER_SITE_OTHER): Payer: Medicare PPO | Admitting: Family Medicine

## 2015-09-22 ENCOUNTER — Encounter: Payer: Self-pay | Admitting: Family Medicine

## 2015-09-22 VITALS — BP 152/68 | HR 62 | Temp 98.7°F | Resp 16 | Wt 138.0 lb

## 2015-09-22 DIAGNOSIS — Z23 Encounter for immunization: Secondary | ICD-10-CM

## 2015-09-22 DIAGNOSIS — F0390 Unspecified dementia without behavioral disturbance: Secondary | ICD-10-CM | POA: Diagnosis not present

## 2015-09-22 DIAGNOSIS — I10 Essential (primary) hypertension: Secondary | ICD-10-CM

## 2015-09-22 DIAGNOSIS — F17200 Nicotine dependence, unspecified, uncomplicated: Secondary | ICD-10-CM | POA: Diagnosis not present

## 2015-09-22 NOTE — Progress Notes (Signed)
Patient ID: [name] [name], female   DOB: February 22, 1941, 75 y.o.   MRN: [id number]         Patient: [name] SangerCecilia R [name] Female    DOB: February 22, 1941   75 y.o.   MRN: [id number] Visit Date: 09/22/2015  Today's Provider: Lorie PhenixNancy Essie Gehret, MD   Chief Complaint  Patient presents with  . Hypertension  . Dementia   Subjective:    HPI   Hypertension, follow-up:  BP Readings from Last 3 Encounters:  09/22/15 152/68  08/23/15 146/54  07/20/15 120/58    She was last seen for hypertension 6 months ago.  Management since that visit includes None .She reports excellent compliance with treatment. She is not having side effects.  She is not exercising. She is adherent to low salt diet.   Patient denies chest pain, lower extremity edema, palpitations and syncope.   ------------------------------------------------------------------------  Dementia: She is here for evaluation and treatment of cognitive problems. She is accompanied by patient and spouse. Primary caregiver is patient and spouse. The family and the patient identify problems with changes in short and long term memory.  Medication administration: family sets up medications   Functional Assessment:  Activities of Daily Living (ADLs):   She is independent in the following: ambulation Requires assistance with the following: grooming        Allergies  Allergen Reactions  . Codeine Other (See Comments)  . Aspirin     unknown  . Penicillins     Has patient had a PCN reaction causing immediate rash, facial/tongue/throat swelling, SOB or lightheadedness with hypotension: NO Has patient had a PCN reaction causing severe rash involving mucus membranes or skin necrosis: NO Has patient had a PCN reaction that required hospitalization NO Has patient had a PCN reaction occurring within the last 10 years: NO If all of the above answers are "NO", then may proceed with Cephalosporin use.   Previous Medications   ALPRAZOLAM (XANAX) 0.5 MG  TABLET    TAKE 1/2 TO 1 TABLET BY MOUTH EVERY 6 HOURS AS NEEDED FOR ANXIETY   ASCORBIC ACID (VITAMIN C) 500 MG TABLET    Take 500 mg by mouth daily.     ASPIRIN EC 81 MG EC TABLET    Take 81 mg by mouth daily.     ATORVASTATIN (LIPITOR) 20 MG TABLET    TAKE 1 TABLET BY MOUTH ONCE A DAY   BUPROPION (WELLBUTRIN) 75 MG TABLET    TAKE ONE TABLET BY MOUTH EVERY MORNING   CYANOCOBALAMIN (VITAMIN B 12) 250 MCG LOZG    Take 500 mg by mouth daily.     DILTIAZEM (CARDIZEM CD) 240 MG 24 HR CAPSULE    Take 1 capsule (240 mg total) by mouth daily.   DIPHENHYDRAMINE-ACETAMINOPHEN (TYLENOL PM EXTRA STRENGTH) 25-500 MG TABS TABLET    Take 1 tablet by mouth at bedtime.    ESCITALOPRAM (LEXAPRO) 20 MG TABLET    TAKE 1 TABLET BY MOUTH ONCE A DAY   LISINOPRIL (PRINIVIL,ZESTRIL) 10 MG TABLET    TAKE 1 TABLET BY MOUTH ONCE A DAY   METOPROLOL (LOPRESSOR) 100 MG TABLET    Take 1 tablet (100 mg total) by mouth 2 (two) times daily.   MULTIPLE VITAMINS-CALCIUM (EQL ONE DAILY WOMENS PO)    Take 1 tablet by mouth daily.     PANTOPRAZOLE (PROTONIX) 40 MG TABLET    Take 1 tablet (40 mg total) by mouth daily before breakfast.   TEMAZEPAM (RESTORIL) 30 MG CAPSULE  TAKE 1 CAPSULE BY MOUTH EVERY NIGHT AT BEDTIME   WARFARIN (COUMADIN) 5 MG TABLET    Take 0.5-1 tablets (2.5-5 mg total) by mouth See admin instructions. Take  every day except on Tuesdays, Sundays and Thursdays take 2.5mg     Review of Systems  Constitutional: Negative.   Respiratory: Positive for cough and wheezing. Negative for apnea, choking, chest tightness, shortness of breath and stridor.   Cardiovascular: Negative.   Gastrointestinal: Positive for constipation. Negative for nausea, vomiting, abdominal pain, diarrhea, blood in stool, abdominal distention, anal bleeding and rectal pain.  Psychiatric/Behavioral: Negative for sleep disturbance, dysphoric mood, decreased concentration and agitation. The patient is not nervous/anxious.     Social History    Substance Use Topics  . Smoking status: Former Smoker -- 1.00 packs/day for 35 years    Types: Cigarettes    Quit date: 08/25/2015  . Smokeless tobacco: Current User  . Alcohol Use: No   Objective:   BP 152/68 mmHg  Pulse 62  Temp(Src) 98.7 F (37.1 C) (Oral)  Resp 16  Wt 138 lb (62.596 kg)  SpO2 96%  Physical Exam  Constitutional: She appears well-developed and well-nourished.  Cardiovascular: Normal rate and regular rhythm.   Murmur heard. Pulmonary/Chest: Effort normal and breath sounds normal.  Neurological: She is alert.  Skin: Skin is warm, dry and intact.  Psychiatric: She has a normal mood and affect. Her speech is normal and behavior is normal. Judgment normal. Cognition and memory are impaired.      Assessment & Plan:     1. Need for vaccination with 13-polyvalent pneumococcal conjugate vaccine Given in the office today.  - Pneumococcal conjugate vaccine 13-valent  2. Essential hypertension Slightly elevated today; will not change medications right now.  Advised [name] [name] to monitor her blood pressure at home.  Will call if elevated.    3. Dementia, without behavioral disturbance Progressing; but pt is stable.  Needs 24 hour care by her husband.     4. TOBACCO ABUSE Resolved.  Pt quit about a month ago secondary to forgetting how to smoke.   Patient was seen and examined by Leo Grosser, MD, and note scribed by Kavin Leech, CMA.   I have reviewed the document for accuracy and completeness and I agree with above. - Leo Grosser, MD    Lorie Phenix, MD  [facility]

## 2015-09-26 ENCOUNTER — Other Ambulatory Visit: Payer: Self-pay

## 2015-09-26 MED ORDER — METOPROLOL TARTRATE 100 MG PO TABS: 100.0000 mg | ORAL_TABLET | Freq: Two times a day (BID) | ORAL | Status: DC

## 2015-10-05 ENCOUNTER — Other Ambulatory Visit: Payer: Self-pay

## 2015-10-05 MED ORDER — PANTOPRAZOLE SODIUM 40 MG PO TBEC: 40.0000 mg | DELAYED_RELEASE_TABLET | Freq: Every day | ORAL | Status: DC

## 2015-10-11 ENCOUNTER — Ambulatory Visit (INDEPENDENT_AMBULATORY_CARE_PROVIDER_SITE_OTHER): Payer: Medicare PPO

## 2015-10-11 DIAGNOSIS — Z9889 Other specified postprocedural states: Secondary | ICD-10-CM

## 2015-10-11 DIAGNOSIS — Z5181 Encounter for therapeutic drug level monitoring: Secondary | ICD-10-CM | POA: Diagnosis not present

## 2015-10-11 DIAGNOSIS — G459 Transient cerebral ischemic attack, unspecified: Secondary | ICD-10-CM | POA: Diagnosis not present

## 2015-10-11 DIAGNOSIS — Z7901 Long term (current) use of anticoagulants: Secondary | ICD-10-CM | POA: Diagnosis not present

## 2015-10-11 DIAGNOSIS — I4891 Unspecified atrial fibrillation: Secondary | ICD-10-CM | POA: Diagnosis not present

## 2015-10-11 DIAGNOSIS — I480 Paroxysmal atrial fibrillation: Secondary | ICD-10-CM

## 2015-10-11 LAB — POCT INR: INR: 2.8

## 2015-10-25 ENCOUNTER — Ambulatory Visit: Payer: Medicare PPO | Admitting: Cardiology

## 2015-11-01 ENCOUNTER — Ambulatory Visit (INDEPENDENT_AMBULATORY_CARE_PROVIDER_SITE_OTHER): Payer: Medicare PPO

## 2015-11-01 DIAGNOSIS — G459 Transient cerebral ischemic attack, unspecified: Secondary | ICD-10-CM | POA: Diagnosis not present

## 2015-11-01 DIAGNOSIS — I4891 Unspecified atrial fibrillation: Secondary | ICD-10-CM | POA: Diagnosis not present

## 2015-11-01 DIAGNOSIS — Z7901 Long term (current) use of anticoagulants: Secondary | ICD-10-CM

## 2015-11-01 DIAGNOSIS — I480 Paroxysmal atrial fibrillation: Secondary | ICD-10-CM

## 2015-11-01 DIAGNOSIS — Z5181 Encounter for therapeutic drug level monitoring: Secondary | ICD-10-CM

## 2015-11-01 DIAGNOSIS — Z9889 Other specified postprocedural states: Secondary | ICD-10-CM

## 2015-11-01 LAB — POCT INR: INR: 4.5

## 2015-11-22 ENCOUNTER — Ambulatory Visit (INDEPENDENT_AMBULATORY_CARE_PROVIDER_SITE_OTHER): Payer: Medicare PPO | Admitting: *Deleted

## 2015-11-22 DIAGNOSIS — Z9889 Other specified postprocedural states: Secondary | ICD-10-CM | POA: Diagnosis not present

## 2015-11-22 DIAGNOSIS — Z7901 Long term (current) use of anticoagulants: Secondary | ICD-10-CM

## 2015-11-22 DIAGNOSIS — I480 Paroxysmal atrial fibrillation: Secondary | ICD-10-CM

## 2015-11-22 DIAGNOSIS — Z5181 Encounter for therapeutic drug level monitoring: Secondary | ICD-10-CM

## 2015-11-22 DIAGNOSIS — G459 Transient cerebral ischemic attack, unspecified: Secondary | ICD-10-CM | POA: Diagnosis not present

## 2015-11-22 DIAGNOSIS — I4891 Unspecified atrial fibrillation: Secondary | ICD-10-CM

## 2015-11-22 LAB — POCT INR: INR: 3.8

## 2015-11-28 ENCOUNTER — Other Ambulatory Visit: Payer: Self-pay | Admitting: *Deleted

## 2015-11-28 DIAGNOSIS — F419 Anxiety disorder, unspecified: Secondary | ICD-10-CM

## 2015-11-28 MED ORDER — ALPRAZOLAM 0.5 MG PO TABS: ORAL_TABLET | ORAL | Status: DC

## 2015-11-28 NOTE — Telephone Encounter (Signed)
Rx called in to pharmacy. 

## 2015-11-28 NOTE — Telephone Encounter (Signed)
Patient has an appt 02/22/16 with [facility].

## 2015-11-28 NOTE — Telephone Encounter (Signed)
Please call in alprazolam.  

## 2015-12-15 NOTE — Telephone Encounter (Signed)
error 

## 2015-12-20 ENCOUNTER — Ambulatory Visit (INDEPENDENT_AMBULATORY_CARE_PROVIDER_SITE_OTHER): Payer: Medicare PPO

## 2015-12-20 DIAGNOSIS — Z9889 Other specified postprocedural states: Secondary | ICD-10-CM

## 2015-12-20 DIAGNOSIS — I480 Paroxysmal atrial fibrillation: Secondary | ICD-10-CM

## 2015-12-20 DIAGNOSIS — I4891 Unspecified atrial fibrillation: Secondary | ICD-10-CM

## 2015-12-20 DIAGNOSIS — G459 Transient cerebral ischemic attack, unspecified: Secondary | ICD-10-CM | POA: Diagnosis not present

## 2015-12-20 DIAGNOSIS — Z7901 Long term (current) use of anticoagulants: Secondary | ICD-10-CM | POA: Diagnosis not present

## 2015-12-20 DIAGNOSIS — Z5181 Encounter for therapeutic drug level monitoring: Secondary | ICD-10-CM

## 2015-12-20 LAB — POCT INR: INR: 3.2

## 2015-12-25 ENCOUNTER — Other Ambulatory Visit: Payer: Self-pay | Admitting: Family Medicine

## 2015-12-25 DIAGNOSIS — F329 Major depressive disorder, single episode, unspecified: Secondary | ICD-10-CM

## 2015-12-25 DIAGNOSIS — F32A Depression, unspecified: Secondary | ICD-10-CM

## 2016-01-01 ENCOUNTER — Other Ambulatory Visit: Payer: Self-pay | Admitting: *Deleted

## 2016-01-01 MED ORDER — WARFARIN SODIUM 5 MG PO TABS: ORAL_TABLET | ORAL | 12 refills | Status: DC

## 2016-01-17 ENCOUNTER — Ambulatory Visit (INDEPENDENT_AMBULATORY_CARE_PROVIDER_SITE_OTHER): Payer: Medicare PPO

## 2016-01-17 DIAGNOSIS — I480 Paroxysmal atrial fibrillation: Secondary | ICD-10-CM

## 2016-01-17 DIAGNOSIS — G459 Transient cerebral ischemic attack, unspecified: Secondary | ICD-10-CM | POA: Diagnosis not present

## 2016-01-17 DIAGNOSIS — Z5181 Encounter for therapeutic drug level monitoring: Secondary | ICD-10-CM

## 2016-01-17 DIAGNOSIS — Z9889 Other specified postprocedural states: Secondary | ICD-10-CM | POA: Diagnosis not present

## 2016-01-17 DIAGNOSIS — I4891 Unspecified atrial fibrillation: Secondary | ICD-10-CM

## 2016-01-17 DIAGNOSIS — Z7901 Long term (current) use of anticoagulants: Secondary | ICD-10-CM

## 2016-01-17 LAB — POCT INR: INR: 2.5

## 2016-01-23 ENCOUNTER — Other Ambulatory Visit: Payer: Self-pay | Admitting: *Deleted

## 2016-01-23 MED ORDER — METOPROLOL TARTRATE 100 MG PO TABS: 100.0000 mg | ORAL_TABLET | Freq: Two times a day (BID) | ORAL | 3 refills | Status: DC

## 2016-01-31 ENCOUNTER — Ambulatory Visit (INDEPENDENT_AMBULATORY_CARE_PROVIDER_SITE_OTHER): Payer: Medicare PPO | Admitting: *Deleted

## 2016-01-31 DIAGNOSIS — Z9889 Other specified postprocedural states: Secondary | ICD-10-CM

## 2016-01-31 DIAGNOSIS — G459 Transient cerebral ischemic attack, unspecified: Secondary | ICD-10-CM

## 2016-01-31 DIAGNOSIS — I4891 Unspecified atrial fibrillation: Secondary | ICD-10-CM | POA: Diagnosis not present

## 2016-01-31 DIAGNOSIS — Z7901 Long term (current) use of anticoagulants: Secondary | ICD-10-CM

## 2016-01-31 DIAGNOSIS — I480 Paroxysmal atrial fibrillation: Secondary | ICD-10-CM

## 2016-01-31 DIAGNOSIS — Z5181 Encounter for therapeutic drug level monitoring: Secondary | ICD-10-CM

## 2016-01-31 LAB — POCT INR: INR: 2.9

## 2016-02-08 ENCOUNTER — Other Ambulatory Visit: Payer: Self-pay | Admitting: *Deleted

## 2016-02-08 DIAGNOSIS — G4700 Insomnia, unspecified: Secondary | ICD-10-CM

## 2016-02-08 NOTE — Telephone Encounter (Signed)
Refill request for temazepam 30 mg qd Last filled by MD on- 08/07/2015 #90 x1 Last Appt: 09/22/2015 Next Appt: 02/22/2016 Please advise refill?

## 2016-02-12 ENCOUNTER — Other Ambulatory Visit: Payer: Self-pay | Admitting: Emergency Medicine

## 2016-02-12 DIAGNOSIS — G4700 Insomnia, unspecified: Secondary | ICD-10-CM

## 2016-02-12 MED ORDER — TEMAZEPAM 30 MG PO CAPS: 30.0000 mg | ORAL_CAPSULE | Freq: Every day | ORAL | 0 refills | Status: DC

## 2016-02-13 NOTE — Telephone Encounter (Signed)
Rx called into pharmacy. Per Ples [facility]

## 2016-02-14 ENCOUNTER — Ambulatory Visit (INDEPENDENT_AMBULATORY_CARE_PROVIDER_SITE_OTHER): Payer: Medicare PPO

## 2016-02-14 DIAGNOSIS — Z9889 Other specified postprocedural states: Secondary | ICD-10-CM | POA: Diagnosis not present

## 2016-02-14 DIAGNOSIS — I4891 Unspecified atrial fibrillation: Secondary | ICD-10-CM | POA: Diagnosis not present

## 2016-02-14 DIAGNOSIS — Z5181 Encounter for therapeutic drug level monitoring: Secondary | ICD-10-CM

## 2016-02-14 DIAGNOSIS — G459 Transient cerebral ischemic attack, unspecified: Secondary | ICD-10-CM | POA: Diagnosis not present

## 2016-02-14 DIAGNOSIS — Z7901 Long term (current) use of anticoagulants: Secondary | ICD-10-CM

## 2016-02-14 DIAGNOSIS — I480 Paroxysmal atrial fibrillation: Secondary | ICD-10-CM

## 2016-02-14 LAB — POCT INR: INR: 3.4

## 2016-02-22 ENCOUNTER — Ambulatory Visit (INDEPENDENT_AMBULATORY_CARE_PROVIDER_SITE_OTHER): Payer: Medicare PPO | Admitting: Family Medicine

## 2016-02-22 ENCOUNTER — Encounter: Payer: Self-pay | Admitting: Family Medicine

## 2016-02-22 VITALS — BP 142/62 | HR 53 | Temp 98.2°F | Resp 18 | Wt 148.0 lb

## 2016-02-22 DIAGNOSIS — F0280 Dementia in other diseases classified elsewhere without behavioral disturbance: Secondary | ICD-10-CM | POA: Diagnosis not present

## 2016-02-22 DIAGNOSIS — I6523 Occlusion and stenosis of bilateral carotid arteries: Secondary | ICD-10-CM

## 2016-02-22 DIAGNOSIS — G4700 Insomnia, unspecified: Secondary | ICD-10-CM | POA: Diagnosis not present

## 2016-02-22 DIAGNOSIS — G309 Alzheimer's disease, unspecified: Secondary | ICD-10-CM | POA: Diagnosis not present

## 2016-02-22 DIAGNOSIS — F419 Anxiety disorder, unspecified: Secondary | ICD-10-CM

## 2016-02-22 DIAGNOSIS — I10 Essential (primary) hypertension: Secondary | ICD-10-CM | POA: Diagnosis not present

## 2016-02-22 DIAGNOSIS — I480 Paroxysmal atrial fibrillation: Secondary | ICD-10-CM | POA: Diagnosis not present

## 2016-02-22 MED ORDER — HALOPERIDOL 0.5 MG PO TABS: ORAL_TABLET | ORAL | 1 refills | Status: DC

## 2016-02-22 MED ORDER — TRAZODONE HCL 100 MG PO TABS
100.0000 mg | ORAL_TABLET | Freq: Every day | ORAL | 3 refills | Status: DC
Start: 2016-02-22 — End: 2016-06-13

## 2016-02-22 NOTE — Progress Notes (Signed)
Patient: [name] [name] Female    DOB: 02-09-1941   75 y.o.   MRN: [id number] Visit Date: 02/22/2016  Today's Provider: Mila Merry, MD   Chief Complaint  Patient presents with  . Hypertension    follow up  . Dementia    follow up  . Anxiety    follow up   Subjective:    HPI  Hypertension, follow-up:  BP Readings from Last 3 Encounters:  02/22/16 (!) 142/62  09/22/15 (!) 152/68  08/23/15 (!) 146/54    She was last seen for hypertension 5 months ago.  BP at that visit was . Management since that visit includes 152/78. She reports good compliance with treatment. She is not having side effects.  She is not exercising. She is adherent to low salt diet.   Outside blood pressures are not checked regularly. She is experiencing none.  Patient denies chest pain, chest pressure/discomfort, claudication, dyspnea, exertional chest pressure/discomfort, fatigue, irregular heart beat, lower extremity edema, near-syncope, orthopnea, palpitations, paroxysmal nocturnal dyspnea, syncope and tachypnea.   Cardiovascular risk factors include hypertension.  Use of agents associated with hypertension: NSAIDS.     Weight trend: increasing steadily Wt Readings from Last 3 Encounters:  02/22/16 148 lb (67.1 kg)  09/22/15 138 lb (62.6 kg)  08/23/15 139 lb (63 kg)    Current diet: well balanced  ------------------------------------------------------------------------ Follow up Dementia: Patient was last seen 5 months ago and no changes were made. Patients husband states the Dementia has worsened. He states she was previously on medication for dementia, but they didn't help much so were discontinue. He is not interested in trying any other medications at this time.   Follow up Anxiety: Patient was last seen for this problem 5 month ago. Patients husband reports good compliance with treatment, good tolerance and poor symptom control. Patients husband Jonny Ruiz states her anxiety is  worse in the evening and at night. She has been taking alprazolam prn for anxiety and temazepam at bedtime, which used to work well, but not anymore.   She also has history of a-fib and prosthetic mitral valve and is followed by Dr. Alvino Chapel, who also manages warfarin.     Allergies  Allergen Reactions  . Codeine Other (See Comments)  . Aspirin     unknown  . Penicillins     Has patient had a PCN reaction causing immediate rash, facial/tongue/throat swelling, SOB or lightheadedness with hypotension: NO Has patient had a PCN reaction causing severe rash involving mucus membranes or skin necrosis: NO Has patient had a PCN reaction that required hospitalization NO Has patient had a PCN reaction occurring within the last 10 years: NO If all of the above answers are "NO", then may proceed with Cephalosporin use.     Current Outpatient Prescriptions:  .  ALPRAZolam (XANAX) 0.5 MG tablet, TAKE 1/2 TO 1 TABLET BY MOUTH EVERY 6 HOURS AS NEEDED FOR ANXIETY, Disp: 60 tablet, Rfl: 5 .  Ascorbic Acid (VITAMIN C) 500 MG tablet, Take 500 mg by mouth daily.  , Disp: , Rfl:  .  aspirin EC 81 MG EC tablet, Take 81 mg by mouth daily.  , Disp: , Rfl:  .  atorvastatin (LIPITOR) 20 MG tablet, TAKE 1 TABLET BY MOUTH ONCE A DAY, Disp: 90 tablet, Rfl: 3 .  buPROPion (WELLBUTRIN) 75 MG tablet, TAKE ONE TABLET BY MOUTH EVERY MORNING, Disp: 90 tablet, Rfl: 1 .  Cyanocobalamin (VITAMIN B 12) 250 MCG LOZG, Take 500 mg  by mouth daily.  , Disp: , Rfl:  .  diltiazem (CARDIZEM CD) 240 MG 24 hr capsule, Take 1 capsule (240 mg total) by mouth daily., Disp: 90 capsule, Rfl: 3 .  diphenhydramine-acetaminophen (TYLENOL PM EXTRA STRENGTH) 25-500 MG TABS tablet, Take 1 tablet by mouth at bedtime. , Disp: , Rfl:  .  escitalopram (LEXAPRO) 20 MG tablet, TAKE 1 TABLET BY MOUTH ONCE A DAY, Disp: 90 tablet, Rfl: 4 .  lisinopril (PRINIVIL,ZESTRIL) 10 MG tablet, TAKE 1 TABLET BY MOUTH ONCE A DAY, Disp: 30 tablet, Rfl: 11 .  metoprolol  (LOPRESSOR) 100 MG tablet, Take 1 tablet (100 mg total) by mouth 2 (two) times daily., Disp: 60 tablet, Rfl: 3 .  Multiple Vitamins-Calcium (EQL ONE DAILY WOMENS PO), Take 1 tablet by mouth daily.  , Disp: , Rfl:  .  pantoprazole (PROTONIX) 40 MG tablet, Take 1 tablet (40 mg total) by mouth daily before breakfast., Disp: 90 tablet, Rfl: 1 .  temazepam (RESTORIL) 30 MG capsule, Take 1 capsule (30 mg total) by mouth at bedtime., Disp: 30 capsule, Rfl: 0 .  warfarin (COUMADIN) 5 MG tablet, Take 1/2 to 1 tablet daily as directed by physician, Disp: 30 tablet, Rfl: 12  Review of Systems  Constitutional: Negative for appetite change, chills, fatigue and fever.  Respiratory: Negative for chest tightness and shortness of breath.   Cardiovascular: Negative for chest pain and palpitations.  Gastrointestinal: Negative for abdominal pain, nausea and vomiting.  Neurological: Negative for dizziness and weakness.  Psychiatric/Behavioral: Positive for confusion, decreased concentration and hallucinations. Negative for suicidal ideas. The patient is nervous/anxious.     Social History  Substance Use Topics  . Smoking status: Former Smoker    Packs/day: 1.00    Years: 35.00    Types: Cigarettes    Quit date: 08/25/2015  . Smokeless tobacco: Former NeurosurgeonUser  . Alcohol use No   Objective:   BP (!) 142/62 (BP Location: Right Arm, Patient Position: Sitting, Cuff Size: Normal)   Pulse (!) 53   Temp 98.2 F (36.8 C) (Oral)   Resp 18   Wt 148 lb (67.1 kg)   SpO2 96% Comment: room air  BMI 23.89 kg/m   Physical Exam   General Appearance:    Alert, cooperative, no distress  Eyes:    PERRL, conjunctiva/corneas clear, EOM's intact       Lungs:     Clear to auscultation bilaterally, respirations unlabored  Heart:     Irregularly irregular rhythm. Normal rate II/VI systolic murmur  Neurologic:   Awake, alert, oriented only to person. No apparent focal neurological           defect.           Assessment &  Plan:     1. PAF (paroxysmal atrial fibrillation) (HCC)  - CBC  2. Bilateral carotid artery stenosis  - Lipid panel - Hepatic function panel  3. Essential hypertension, benign  - Renal function panel  4. Essential hypertension Stable Continue current medications.    5. Alzheimer's dementia without behavioral disturbance, unspecified timing of dementia onset Advanced. Not interested in trying medications again.   6. Insomnia, unspecified type Temazepam no longer effective. Try change to trazodone - traZODone (DESYREL) 100 MG tablet; Take 1 tablet (100 mg total) by mouth at bedtime.  Dispense: 30 tablet; Refill: 3  7. Anxiety Xanax no longer effective. Try haloperidol prn only.  - haloperidol (HALDOL) 0.5 MG tablet; One tablet once or twice a day as needed  for anxiety  Dispense: 30 tablet; Refill: 1     The entirety of the information documented in the History of Present Illness, Review of Systems and Physical Exam were personally obtained by me. Portions of this information were initially documented by Anson [place], CMA and reviewed by me for thoroughness and accuracy.    Mila Merry, MD  [facility]

## 2016-02-28 ENCOUNTER — Encounter: Payer: Self-pay | Admitting: Cardiology

## 2016-02-28 ENCOUNTER — Ambulatory Visit (INDEPENDENT_AMBULATORY_CARE_PROVIDER_SITE_OTHER): Payer: Medicare PPO | Admitting: *Deleted

## 2016-02-28 ENCOUNTER — Ambulatory Visit (INDEPENDENT_AMBULATORY_CARE_PROVIDER_SITE_OTHER): Payer: Medicare PPO | Admitting: Cardiology

## 2016-02-28 VITALS — BP 150/64 | HR 49 | Ht 66.0 in | Wt 151.0 lb

## 2016-02-28 DIAGNOSIS — I6523 Occlusion and stenosis of bilateral carotid arteries: Secondary | ICD-10-CM | POA: Diagnosis not present

## 2016-02-28 DIAGNOSIS — Z7901 Long term (current) use of anticoagulants: Secondary | ICD-10-CM

## 2016-02-28 DIAGNOSIS — Z5181 Encounter for therapeutic drug level monitoring: Secondary | ICD-10-CM

## 2016-02-28 DIAGNOSIS — I4891 Unspecified atrial fibrillation: Secondary | ICD-10-CM

## 2016-02-28 DIAGNOSIS — Z9889 Other specified postprocedural states: Secondary | ICD-10-CM

## 2016-02-28 DIAGNOSIS — G459 Transient cerebral ischemic attack, unspecified: Secondary | ICD-10-CM | POA: Diagnosis not present

## 2016-02-28 DIAGNOSIS — I10 Essential (primary) hypertension: Secondary | ICD-10-CM

## 2016-02-28 DIAGNOSIS — I480 Paroxysmal atrial fibrillation: Secondary | ICD-10-CM

## 2016-02-28 DIAGNOSIS — R001 Bradycardia, unspecified: Secondary | ICD-10-CM

## 2016-02-28 LAB — POCT INR: INR: 3.8

## 2016-02-28 MED ORDER — METOPROLOL TARTRATE 50 MG PO TABS: 50.0000 mg | ORAL_TABLET | Freq: Two times a day (BID) | ORAL | 6 refills | Status: DC

## 2016-02-28 NOTE — Patient Instructions (Signed)
Medication Instructions:  Your physician has recommended you make the following change in your medication:  1. Take Metoprolol 100 mg 1/2 tablet twice daily until your current pills are gone. 2. THEN START Metoprolol 50 mg twice daily.  Testing/Procedures: Your physician has requested that you have an echocardiogram in 6 months before your next appointment. Echocardiography is a painless test that uses sound waves to create images of your heart. It provides your doctor with information about the size and shape of your heart and how well your heart's chambers and valves are working. This procedure takes approximately one hour. There are no restrictions for this procedure.   Follow-Up: Your physician wants you to follow-up in: 6 months with Dr. Alvino ChapelIngal.  You will receive a reminder letter in the mail two months in advance. If you don't receive a letter, please call our office to schedule the follow-up appointment.  It was a pleasure seeing you today here in the office. Please do not hesitate to give us a call back if you have any further questions. [phone]  [place] CellarPamela A. RN, BSN

## 2016-02-28 NOTE — Progress Notes (Signed)
Cardiology Office Note   Date:  02/28/2016   ID:  [name] [name], DOB Sep 04, 1940, MRN [id number]  Referring Doctor:  Mila Merry, MD   Cardiologist:   Almond Lint, MD   Reason for consultation:  Chief Complaint  Patient presents with  . PAF (paroxysmal atrial fibrillation) (HCC)  . Essential hypertension, benign  . Carotid stenosis, bilateral  . Long term current use of anticoagulant therapy      History of Present Illness: [name] [name] is a 75 y.o. female who presents for ffup For atrial flutter ablation, hypertension, mechanical mitral valve  - afib -- patient unaware of any change in her rhythm. No complaints of palpitations.  -- History of mechanical mitral valve -- patient continues to follow up with Coumadin clinic, no recurrence of bleeding. Husband says that the INR targets were 3-4 and she was found to have mini strokes despite target of 2.5-3.5.   Patient denies chest pain, shortness of breath, palpitations, loss of consciousness, abdominal pain. No orthopnea, PND, edema.   ROS:  Please see the history of present illness. Aside from mentioned under HPI, all other systems are reviewed and negative.     Past Medical History:  Diagnosis Date  . Anxiety   . Carotid stenosis    a. Carotid US (9/14):  Bilat 1-39% => f/u PRN  . Dementia   . Depression   . Gastroesophageal reflux disease   . History of echocardiogram    Echo 2/17: mild LVH, EF 55-60%, mild to mod AS (mean 14 mmHg), mechanical MVR ok (mean 7 mmHgg), mild LAE, PASP 58 mmHg  . HLD (hyperlipidemia)   . HTN (hypertension)   . Mitral regurgitation    a. s/p Bjork Shiley mechanical mitral valve replacement in 1980.  . 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 non-obstructive by cath 2007.  [name] PAF (paroxysmal atrial fibrillation) (HCC)    on chronic coumadin  . Paroxysmal atrial flutter (HCC)    s/p RFA x 2  . TIA (transient ischemic attack)   . Tobacco abuse     Past  Surgical History:  Procedure Laterality Date  . A FLUTTER ABLATION     x 2  . ESOPHAGOGASTRODUODENOSCOPY N/A 06/04/2015   Procedure: ESOPHAGOGASTRODUODENOSCOPY (EGD);  Surgeon: Iva Boop, MD;  Location: [facility] ENDOSCOPY;  Service: Endoscopy;  Laterality: N/A;  . HIP ARTHROPLASTY Left 05/07/2014   Procedure: ARTHROPLASTY MONOPOLAR HIP;  Surgeon: Eldred Manges, MD;  Location: [facility] OR;  Service: Orthopedics;  Laterality: Left;  . MITRAL VALVE REPLACEMENT     Bjork-Shiley valve placed for mitral regurgitation     reports that she quit smoking about 6 months ago. Her smoking use included Cigarettes. She has a 35.00 pack-year smoking history. She has quit using smokeless tobacco. She reports that she does not drink alcohol or use drugs.   family history includes Heart attack in her other.   Current Outpatient Prescriptions  Medication Sig Dispense Refill  . ALPRAZolam (XANAX) 0.5 MG tablet Take 0.5 mg by mouth daily.    . Ascorbic Acid (VITAMIN C) 500 MG tablet Take 500 mg by mouth daily.      [name] aspirin EC 81 MG EC tablet Take 81 mg by mouth daily.      [name] atorvastatin (LIPITOR) 20 MG tablet TAKE 1 TABLET BY MOUTH ONCE A DAY 90 tablet 3  . buPROPion (WELLBUTRIN) 75 MG tablet TAKE ONE TABLET BY MOUTH EVERY MORNING 90 tablet 1  . Cyanocobalamin (  VITAMIN B 12) 250 MCG LOZG Take 500 mg by mouth daily.      [name] diltiazem (CARDIZEM CD) 240 MG 24 hr capsule Take 1 capsule (240 mg total) by mouth daily. 90 capsule 3  . diphenhydramine-acetaminophen (TYLENOL PM EXTRA STRENGTH) 25-500 MG TABS tablet Take 1 tablet by mouth at bedtime.     [name] escitalopram (LEXAPRO) 20 MG tablet TAKE 1 TABLET BY MOUTH ONCE A DAY 90 tablet 4  . haloperidol (HALDOL) 1 MG tablet Take 0.5 mg by mouth daily.     [name] lisinopril (PRINIVIL,ZESTRIL) 10 MG tablet TAKE 1 TABLET BY MOUTH ONCE A DAY 30 tablet 11  . metoprolol (LOPRESSOR) 100 MG tablet Take 1 tablet (100 mg total) by mouth 2 (two) times daily. 60 tablet 3  . Multiple  Vitamins-Calcium (EQL ONE DAILY WOMENS PO) Take 1 tablet by mouth daily.      . pantoprazole (PROTONIX) 40 MG tablet Take 1 tablet (40 mg total) by mouth daily before breakfast. 90 tablet 1  . traZODone (DESYREL) 100 MG tablet Take 1 tablet (100 mg total) by mouth at bedtime. 30 tablet 3  . warfarin (COUMADIN) 5 MG tablet Take 1/2 to 1 tablet daily as directed by physician 30 tablet 12   No current facility-administered medications for this visit.     Allergies: Codeine; Aspirin; and Penicillins    PHYSICAL EXAM: VS:  BP (!) 150/64   Pulse (!) 49   Ht 5\' 6"  (1.676 m)   Wt 151 lb (68.5 kg)   SpO2 90%   BMI 24.37 kg/m  , Body mass index is 24.37 kg/m. Wt Readings from Last 3 Encounters:  02/28/16 151 lb (68.5 kg)  02/22/16 148 lb (67.1 kg)  09/22/15 138 lb (62.6 kg)    GENERAL:  well developed, well nourished, not in acute distress HEENT: normocephalic, pink conjunctivae, anicteric sclerae, no xanthelasma, normal dentition, oropharynx clear NECK:  no neck vein engorgement, JVP normal, no hepatojugular reflux, Positive for bruit on left side, no thyromegaly, no lymphadenopathy LUNGS:  good respiratory effort, clear to auscultation bilaterally CV:  PMI not displaced, no thrills, no lifts, S2 within normal limits, no palpable S3 or S4, no rubs, no gallops, 3/6 systolic ejection murmur, minimally radiating up to the neck, no delay in radial artery pulse or carotid pulse. Bilateral radial artery pulses +2 ABD:  Soft, nontender, nondistended, normoactive bowel sounds, no abdominal aortic bruit, no hepatomegaly, no splenomegaly MS: nontender back, no kyphosis, no scoliosis, no joint deformities EXT:  2+ DP/PT pulses, no edema, no varicosities, no cyanosis, no clubbing SKIN: warm, nondiaphoretic, normal turgor, no ulcers NEUROPSYCH: alert, oriented to person only sensory/motor grossly intact, normal mood, flat affect  Recent Labs: 03/27/2015: ALT 18 06/07/2015: TSH 1.984 06/08/2015:  Magnesium 2.2 06/09/2015: BUN 8; Creatinine, Ser 0.61; Hemoglobin 10.2; Platelets 250; Potassium 4.2; Sodium 135   Lipid Panel    Component Value Date/Time   CHOL 157 03/27/2015 1047   TRIG 94 03/27/2015 1047   HDL 48 03/27/2015 1047   CHOLHDL 3.3 03/27/2015 1047   CHOLHDL 4 01/25/2014 1644   VLDL 13.4 01/25/2014 1644   LDLCALC 90 03/27/2015 1047   LDLDIRECT 226.0 04/08/2011 1237     Other studies Reviewed:  EKG:   EKG from 08/23/2015 was personally reviewed by me and it showed A. fib 61 BPM. Nonspecific ST changes similar to before.  The ekg From 07/20/2015 was personally reviewed by me and it reveals appear to be sinus rhythm, 61 BPM, PACs.  Poor R-wave progression.   EKG from 02/28/2016 was personally reviewed by me and it showed sinus bradycardic, 49 BPM. LAD. Nonspecific ST-T wave changes.  Additional studies/ records that were reviewed personally reviewed by me today include:   Echo 06/22/2015: Mild concentric hypertrophy. LVEF 55-60%. Mild to moderate AS, mean gradient 14 mmHg, valve area by VTI 1.15 cm. Mechanical mitral valve prosthesis present and functioning normally. Mean gradient 7 mmHg. LA mildly dilated. PA pressure 58 mmHg.  Carotid ultrasound 08/09/2015: Heterogeneous plaque, bilaterally. Stable 1-39% ICA stenosis, bilaterally. > 50% RECA stenosis. Patent vertebral arteries with antegrade flow. Normal right subclavian artery. Elevated velocities left subclavian artery.  ASSESSMENT AND PLAN:  - s/p  Mechanical mitral valve - Continue warfarin goal INR 3-4 as per husband's request and ASA 81 daily. Husband mentions that that goal was to increase many years ago when patient was found to have mini strokes on the usual INR goal. She needs Lovenox if she has to come off of Coumadin electively. Continue SBE prophylaxis. Continue follow-up with coumadin clinic.  -Paroxysmal AFib/Flutter, sinus bradycardia -  Asymptomatic by history. She remains on  Warfarin. Patient has limited functional capacity, mainly due to her age as well as her dementia. Continue Cardizem CD 240 mg daily. Decrease metoprolol to 50 twice a day due to bradycardia. Continue monitoring blood pressure and heart rate.  - CAD - Non-obstructive by LHC in the past. No angina. Continue ASA, statin, beta-blocker.  -Aortic stenosis - mild to moderate in severity. Continue to monitor. Patient will be poor surgical candidate.  - Hypertension-BP is well controlled. Continue monitoring BP. Continue current medical therapy and lifestyle changes.  -Hyperlipidemia - Continue statin. PCP following labs.   - carotid stenosis - Bilateral ICA 1-39%, stable. Continue medical therapy.  Abnormal cardiovascular test results /Elevated velocities of the left subclavian artery noted on carotid ultrasound Findings of the carotid ultrasound from 08/09/2015 were discussed with patient. These are also noted on previous ultrasounds. Patient has equal pulses on both radial arteries. Recommend that the CT a of the left subclavian artery may be done for further evaluation. Husband and patient declined to proceed with this.   Current medicines are reviewed at length with the patient today.  The patient does not have concerns regarding medicines.  Labs/ tests ordered today include:  Orders Placed This Encounter  Procedures  . EKG 12-Lead    I had a lengthy and detailed discussion with the patient regarding diagnoses, prognosis, diagnostic options, treatment options, and side effects of medications.   I counseled the patient on importance of lifestyle modification including heart healthy diet, regular physical activity  Disposition:   FU with undersigned in 6 months Signed, Almond LintAileen Shenae Bonanno, MD  02/28/2016 3:10 PM     [facility]

## 2016-02-29 ENCOUNTER — Encounter: Payer: Self-pay | Admitting: Cardiology

## 2016-03-14 ENCOUNTER — Telehealth: Payer: Self-pay

## 2016-03-14 LAB — HEPATIC FUNCTION PANEL
ALT: 18 IU/L (ref 0–32)
AST: 21 IU/L (ref 0–40)
Alkaline Phosphatase: 135 IU/L — ABNORMAL HIGH (ref 39–117)
BILIRUBIN TOTAL: 0.8 mg/dL (ref 0.0–1.2)
Bilirubin, Direct: 0.21 mg/dL (ref 0.00–0.40)
TOTAL PROTEIN: 7.1 g/dL (ref 6.0–8.5)

## 2016-03-14 LAB — RENAL FUNCTION PANEL
Albumin: 3.9 g/dL (ref 3.5–4.8)
BUN / CREAT RATIO: 14 (ref 12–28)
BUN: 11 mg/dL (ref 8–27)
CALCIUM: 9.7 mg/dL (ref 8.7–10.3)
CHLORIDE: 99 mmol/L (ref 96–106)
CO2: 27 mmol/L (ref 18–29)
Creatinine, Ser: 0.76 mg/dL (ref 0.57–1.00)
GFR calc Af Amer: 89 mL/min/{1.73_m2} (ref >59)
GFR calc non Af Amer: 77 mL/min/{1.73_m2} (ref >59)
Glucose: 91 mg/dL (ref 65–99)
PHOSPHORUS: 3.7 mg/dL (ref 2.5–4.5)
POTASSIUM: 4.6 mmol/L (ref 3.5–5.2)
SODIUM: 141 mmol/L (ref 134–144)

## 2016-03-14 LAB — LIPID PANEL
CHOL/HDL RATIO: 3.3 ratio (ref 0.0–4.4)
Cholesterol, Total: 206 mg/dL — ABNORMAL HIGH (ref 100–199)
HDL: 62 mg/dL (ref >39)
LDL CALC: 119 mg/dL — AB (ref 0–99)
Triglycerides: 124 mg/dL (ref 0–149)
VLDL Cholesterol Cal: 25 mg/dL (ref 5–40)

## 2016-03-14 LAB — CBC
HEMATOCRIT: 42.2 % (ref 34.0–46.6)
HEMOGLOBIN: 14.1 g/dL (ref 11.1–15.9)
MCH: 28.1 pg (ref 26.6–33.0)
MCHC: 33.4 g/dL (ref 31.5–35.7)
MCV: 84 fL (ref 79–97)
Platelets: 237 10*3/uL (ref 150–379)
RBC: 5.02 x10E6/uL (ref 3.77–5.28)
RDW: 15.1 % (ref 12.3–15.4)
WBC: 8.4 10*3/uL (ref 3.4–10.8)

## 2016-03-14 MED ORDER — ATORVASTATIN CALCIUM 40 MG PO TABS: ORAL_TABLET | ORAL | 1 refills | Status: DC

## 2016-03-14 NOTE — Telephone Encounter (Signed)
Patient has been notified of report, prescription has been called into pharmacy. KW

## 2016-03-14 NOTE — Telephone Encounter (Signed)
-----   Message from Malva Limesonald E Fisher, MD sent at 03/14/2016  8:06 AM EDT ----- Cholesterol is up from 157 to 206. Please double check and see if she still taking atorvastatin 20. If so then she needs to increase to atorvastatin 40mg  once daily, #90, rf x 1 and follow up 3 months.

## 2016-03-18 ENCOUNTER — Other Ambulatory Visit: Payer: Self-pay | Admitting: Family Medicine

## 2016-03-18 DIAGNOSIS — F419 Anxiety disorder, unspecified: Secondary | ICD-10-CM

## 2016-03-20 ENCOUNTER — Ambulatory Visit (INDEPENDENT_AMBULATORY_CARE_PROVIDER_SITE_OTHER): Payer: Medicare PPO

## 2016-03-20 DIAGNOSIS — Z9889 Other specified postprocedural states: Secondary | ICD-10-CM

## 2016-03-20 DIAGNOSIS — I480 Paroxysmal atrial fibrillation: Secondary | ICD-10-CM

## 2016-03-20 DIAGNOSIS — Z5181 Encounter for therapeutic drug level monitoring: Secondary | ICD-10-CM

## 2016-03-20 DIAGNOSIS — Z7901 Long term (current) use of anticoagulants: Secondary | ICD-10-CM | POA: Diagnosis not present

## 2016-03-20 DIAGNOSIS — G459 Transient cerebral ischemic attack, unspecified: Secondary | ICD-10-CM

## 2016-03-20 DIAGNOSIS — I4891 Unspecified atrial fibrillation: Secondary | ICD-10-CM

## 2016-03-20 LAB — POCT INR: INR: 2.8

## 2016-04-05 ENCOUNTER — Other Ambulatory Visit: Payer: Self-pay | Admitting: Family Medicine

## 2016-04-10 ENCOUNTER — Ambulatory Visit (INDEPENDENT_AMBULATORY_CARE_PROVIDER_SITE_OTHER): Payer: Medicare PPO

## 2016-04-10 DIAGNOSIS — Z9889 Other specified postprocedural states: Secondary | ICD-10-CM

## 2016-04-10 DIAGNOSIS — G459 Transient cerebral ischemic attack, unspecified: Secondary | ICD-10-CM | POA: Diagnosis not present

## 2016-04-10 DIAGNOSIS — I4891 Unspecified atrial fibrillation: Secondary | ICD-10-CM

## 2016-04-10 DIAGNOSIS — Z7901 Long term (current) use of anticoagulants: Secondary | ICD-10-CM

## 2016-04-10 DIAGNOSIS — Z5181 Encounter for therapeutic drug level monitoring: Secondary | ICD-10-CM

## 2016-04-10 DIAGNOSIS — I480 Paroxysmal atrial fibrillation: Secondary | ICD-10-CM

## 2016-04-10 LAB — POCT INR: INR: 3.3

## 2016-04-15 ENCOUNTER — Other Ambulatory Visit: Payer: Self-pay | Admitting: Family Medicine

## 2016-04-15 DIAGNOSIS — F329 Major depressive disorder, single episode, unspecified: Secondary | ICD-10-CM

## 2016-04-15 DIAGNOSIS — F32A Depression, unspecified: Secondary | ICD-10-CM

## 2016-05-01 ENCOUNTER — Ambulatory Visit (INDEPENDENT_AMBULATORY_CARE_PROVIDER_SITE_OTHER): Payer: Medicare PPO

## 2016-05-01 DIAGNOSIS — Z5181 Encounter for therapeutic drug level monitoring: Secondary | ICD-10-CM

## 2016-05-01 DIAGNOSIS — Z7901 Long term (current) use of anticoagulants: Secondary | ICD-10-CM | POA: Diagnosis not present

## 2016-05-01 DIAGNOSIS — Z9889 Other specified postprocedural states: Secondary | ICD-10-CM | POA: Diagnosis not present

## 2016-05-01 DIAGNOSIS — G459 Transient cerebral ischemic attack, unspecified: Secondary | ICD-10-CM

## 2016-05-01 DIAGNOSIS — I4891 Unspecified atrial fibrillation: Secondary | ICD-10-CM

## 2016-05-01 DIAGNOSIS — I480 Paroxysmal atrial fibrillation: Secondary | ICD-10-CM

## 2016-05-01 LAB — POCT INR: INR: 3

## 2016-05-16 ENCOUNTER — Other Ambulatory Visit: Payer: Self-pay | Admitting: Family Medicine

## 2016-05-16 NOTE — Telephone Encounter (Signed)
Called into [facility]. Paz Winsett Drozdowski, CMA  

## 2016-05-16 NOTE — Telephone Encounter (Signed)
Please call in alprazolam.  

## 2016-06-05 ENCOUNTER — Ambulatory Visit (INDEPENDENT_AMBULATORY_CARE_PROVIDER_SITE_OTHER): Payer: Medicare PPO

## 2016-06-05 DIAGNOSIS — Z7901 Long term (current) use of anticoagulants: Secondary | ICD-10-CM | POA: Diagnosis not present

## 2016-06-05 DIAGNOSIS — I4891 Unspecified atrial fibrillation: Secondary | ICD-10-CM | POA: Diagnosis not present

## 2016-06-05 DIAGNOSIS — Z9889 Other specified postprocedural states: Secondary | ICD-10-CM

## 2016-06-05 DIAGNOSIS — Z5181 Encounter for therapeutic drug level monitoring: Secondary | ICD-10-CM

## 2016-06-05 DIAGNOSIS — G459 Transient cerebral ischemic attack, unspecified: Secondary | ICD-10-CM | POA: Diagnosis not present

## 2016-06-05 DIAGNOSIS — I480 Paroxysmal atrial fibrillation: Secondary | ICD-10-CM

## 2016-06-05 LAB — POCT INR: INR: 2

## 2016-06-07 ENCOUNTER — Other Ambulatory Visit: Payer: Self-pay | Admitting: Family Medicine

## 2016-06-07 ENCOUNTER — Other Ambulatory Visit: Payer: Self-pay | Admitting: Cardiology

## 2016-06-07 NOTE — Telephone Encounter (Signed)
Pt requesting refill Lisinopril last filled by Marca Ancona[facility]ton [facility] . Ok to refill?

## 2016-06-10 ENCOUNTER — Other Ambulatory Visit: Payer: Self-pay | Admitting: Family Medicine

## 2016-06-11 ENCOUNTER — Other Ambulatory Visit: Payer: Self-pay | Admitting: Family Medicine

## 2016-06-11 ENCOUNTER — Other Ambulatory Visit: Payer: Self-pay

## 2016-06-11 DIAGNOSIS — G4700 Insomnia, unspecified: Secondary | ICD-10-CM

## 2016-06-11 MED ORDER — DILTIAZEM HCL ER COATED BEADS 240 MG PO CP24: 240.0000 mg | ORAL_CAPSULE | Freq: Every day | ORAL | 3 refills | Status: DC

## 2016-06-11 MED ORDER — TEMAZEPAM 30 MG PO CAPS: 30.0000 mg | ORAL_CAPSULE | Freq: Every evening | ORAL | 1 refills | Status: DC | PRN

## 2016-06-11 NOTE — Telephone Encounter (Signed)
Refill sent for Diltiazem  

## 2016-06-11 NOTE — Telephone Encounter (Signed)
Please advise refill? 

## 2016-06-11 NOTE — Telephone Encounter (Addendum)
Pt contacted office for refill request on the following medications:  Temazepam 30mg .  [facility].  ZO#[phone]/JXCB#[phone]/MW  Pt is completely out of medication/MW

## 2016-06-11 NOTE — Telephone Encounter (Signed)
Please call in temazepam 

## 2016-06-11 NOTE — Telephone Encounter (Signed)
Rx called in to pharmacy. 

## 2016-06-13 ENCOUNTER — Other Ambulatory Visit: Payer: Self-pay | Admitting: Family Medicine

## 2016-06-13 DIAGNOSIS — G4700 Insomnia, unspecified: Secondary | ICD-10-CM

## 2016-06-14 ENCOUNTER — Ambulatory Visit (INDEPENDENT_AMBULATORY_CARE_PROVIDER_SITE_OTHER): Payer: Medicare PPO | Admitting: Family Medicine

## 2016-06-14 ENCOUNTER — Encounter: Payer: Self-pay | Admitting: Family Medicine

## 2016-06-14 VITALS — BP 142/78 | HR 52 | Temp 98.6°F | Resp 16 | Wt 170.0 lb

## 2016-06-14 DIAGNOSIS — E785 Hyperlipidemia, unspecified: Secondary | ICD-10-CM

## 2016-06-14 DIAGNOSIS — Z23 Encounter for immunization: Secondary | ICD-10-CM

## 2016-06-14 DIAGNOSIS — F0280 Dementia in other diseases classified elsewhere without behavioral disturbance: Secondary | ICD-10-CM | POA: Diagnosis not present

## 2016-06-14 DIAGNOSIS — I2510 Atherosclerotic heart disease of native coronary artery without angina pectoris: Secondary | ICD-10-CM | POA: Diagnosis not present

## 2016-06-14 DIAGNOSIS — I10 Essential (primary) hypertension: Secondary | ICD-10-CM | POA: Diagnosis not present

## 2016-06-14 DIAGNOSIS — G309 Alzheimer's disease, unspecified: Secondary | ICD-10-CM | POA: Diagnosis not present

## 2016-06-14 NOTE — Progress Notes (Signed)
Patient: [name] [name] Female    DOB: 09-22-1940   76 y.o.   MRN: [id number] Visit Date: 06/14/2016  Today's Provider: Mila Merryonald Shelitha Magley, MD   Chief Complaint  Patient presents with  . Hypertension  . Hyperlipidemia  . Insomnia  . Anxiety   Subjective:    HPI  Hypertension, follow-up:  BP Readings from Last 3 Encounters:  06/14/16 (!) 142/78  02/28/16 (!) 150/64  02/22/16 (!) 142/62    She was last seen for hypertension 4 months ago.  BP at that visit was 142/62. Management since that visit includes no changes. She reports good compliance with treatment. She is not having side effects.  She is not exercising. She is adherent to low salt diet.   Outside blood pressures are not being checked. She is experiencing lower extremity edema.  Patient denies exertional chest pressure/discomfort and irregular heart beat.   Cardiovascular risk factors include dyslipidemia and smoking/ tobacco exposure.     Weight trend: increasing steadily  Wt Readings from Last 3 Encounters:  06/14/16 170 lb (77.1 kg)  02/28/16 151 lb (68.5 kg)  02/22/16 148 lb (67.1 kg)    Current diet: well balanced    Lipid/Cholesterol, Follow-up:   Last seen for this4 months ago.  Management changes since that visit include increased atorvastatin from 20mg  to 40mg . . Last Lipid Panel:    Component Value Date/Time   CHOL 206 (H) 03/13/2016 1043   TRIG 124 03/13/2016 1043   HDL 62 03/13/2016 1043   CHOLHDL 3.3 03/13/2016 1043   CHOLHDL 4 01/25/2014 1644   VLDL 13.4 01/25/2014 1644   LDLCALC 119 (H) 03/13/2016 1043   LDLDIRECT 226.0 04/08/2011 1237    Risk factors for vascular disease include hypertension  She reports good compliance with treatment. She is not having side effects.  Current symptoms include none and have been stable. Weight trend: increasing steadily Prior visit with dietician: no Current diet: well balanced Current exercise: none  Wt Readings from Last 3  Encounters:  06/14/16 170 lb (77.1 kg)  02/28/16 151 lb (68.5 kg)  02/22/16 148 lb (67.1 kg)     Insomnia, follow up: Patient was last seen in the office 4 months ago. Since the last OV, the patient was advised to discontinue trazodone and start temazepam. Patient reports that she is sleeping about the same. She has not noticed any significant changes since starting the new medication.     Anxiety, follow up: Patient was last seen in the office 4 months ago. Since last OV, patient was advised to discontinue Xanax due to no longer being effective. Patient was started on Haloperidol to take on an as needed basis.   Allergies  Allergen Reactions  . Codeine Other (See Comments)  . Aspirin     unknown  . Penicillins     Has patient had a PCN reaction causing immediate rash, facial/tongue/throat swelling, SOB or lightheadedness with hypotension: NO Has patient had a PCN reaction causing severe rash involving mucus membranes or skin necrosis: NO Has patient had a PCN reaction that required hospitalization NO Has patient had a PCN reaction occurring within the last 10 years: NO If all of the above answers are "NO", then may proceed with Cephalosporin use.     Current Outpatient Prescriptions:  .  Ascorbic Acid (VITAMIN C) 500 MG tablet, Take 500 mg by mouth daily.  , Disp: , Rfl:  .  aspirin EC 81 MG EC tablet, Take 81 mg  by mouth daily.  , Disp: , Rfl:  .  atorvastatin (LIPITOR) 40 MG tablet, Take 1 tablet daily, Disp: 90 tablet, Rfl: 1 .  buPROPion (WELLBUTRIN) 75 MG tablet, TAKE 1 TABLET BY MOUTH EVERY MORNING, Disp: 90 tablet, Rfl: 4 .  Cyanocobalamin (VITAMIN B 12) 250 MCG LOZG, Take 500 mg by mouth daily.  , Disp: , Rfl:  .  diltiazem (CARDIZEM CD) 240 MG 24 hr capsule, Take 1 capsule (240 mg total) by mouth daily., Disp: 90 capsule, Rfl: 3 .  diphenhydramine-acetaminophen (TYLENOL PM EXTRA STRENGTH) 25-500 MG TABS tablet, Take 1 tablet by mouth at bedtime. , Disp: , Rfl:  .   escitalopram (LEXAPRO) 20 MG tablet, TAKE 1 TABLET BY MOUTH ONCE A DAY, Disp: 90 tablet, Rfl: 4 .  haloperidol (HALDOL) 1 MG tablet, TAKE 1/2 TABLET BY MOUTH ONCE OR TWICE ADAY AS NEEDED FOR ANXIETY, Disp: 15 tablet, Rfl: 5 .  lisinopril (PRINIVIL,ZESTRIL) 10 MG tablet, TAKE 1 TABLET BY MOUTH ONCE A DAY, Disp: 30 tablet, Rfl: 3 .  Multiple Vitamins-Calcium (EQL ONE DAILY WOMENS PO), Take 1 tablet by mouth daily.  , Disp: , Rfl:  .  pantoprazole (PROTONIX) 40 MG tablet, TAKE 1 TABLET BY MOUTH DAILY BEFORE BREAKFAST, Disp: 90 tablet, Rfl: 4 .  temazepam (RESTORIL) 30 MG capsule, Take 1 capsule (30 mg total) by mouth at bedtime as needed for sleep., Disp: 30 capsule, Rfl: 1 .  traZODone (DESYREL) 100 MG tablet, TAKE 1 TABLET BY MOUTH AT BEDTIME, Disp: 30 tablet, Rfl: 12 .  warfarin (COUMADIN) 5 MG tablet, Take 1/2 to 1 tablet daily as directed by physician, Disp: 30 tablet, Rfl: 12 .  ALPRAZolam (XANAX) 0.5 MG tablet, TAKE 1/2 TO 1 TABLET BY MOUTH EVERY 6 HOURS AS NEEDED FOR ANXIETY (Patient not taking: Reported on 06/14/2016), Disp: 60 tablet, Rfl: 5 .  metoprolol (LOPRESSOR) 50 MG tablet, Take 1 tablet (50 mg total) by mouth 2 (two) times daily., Disp: 60 tablet, Rfl: 6  Review of Systems  Constitutional: Negative.   Respiratory: Negative.   Cardiovascular: Positive for leg swelling. Negative for chest pain and palpitations.  Musculoskeletal: Positive for arthralgias and gait problem.    Social History  Substance Use Topics  . Smoking status: Former Smoker    Packs/day: 1.00    Years: 35.00    Types: Cigarettes    Quit date: 08/25/2015  . Smokeless tobacco: Former Neurosurgeon  . Alcohol use No   Objective:   BP (!) 142/78 (BP Location: Right Arm, Patient Position: Sitting, Cuff Size: Normal)   Pulse (!) 52   Temp 98.6 F (37 C)   Resp 16   Wt 170 lb (77.1 kg)   BMI 27.44 kg/m   Physical Exam   General Appearance:    Alert, cooperative, no distress  Eyes:    PERRL, conjunctiva/corneas  clear, EOM's intact       Lungs:     Clear to auscultation bilaterally, respirations unlabored  Heart:    Regular rate and rhythm, III/VI systolic murmur RUSB.   Neurologic:   Awake, alert, oriented x 3. No apparent focal neurological           defect.           Assessment & Plan:     1. Essential hypertension, benign Well controlled.  Continue current medications.    2. Alzheimer's dementia without behavioral disturbance, unspecified timing of dementia onset Stable, continue current medications.    3. Non-occlusive coronary artery  disease Continue follow up Dr. Alvino Chapel.  - Lipid panel - Comprehensive metabolic panel  4. Hyperlipidemia, unspecified hyperlipidemia type She is tolerating atorvastatin well with no adverse effects.   - TSH  5. Need for influenza vaccination  - Flu vaccine HIGH DOSE PF        Mila Merry, MD  [facility]

## 2016-06-15 LAB — COMPREHENSIVE METABOLIC PANEL
ALBUMIN: 4 g/dL (ref 3.5–4.8)
ALT: 13 IU/L (ref 0–32)
AST: 14 IU/L (ref 0–40)
Albumin/Globulin Ratio: 1.3 (ref 1.2–2.2)
Alkaline Phosphatase: 129 IU/L — ABNORMAL HIGH (ref 39–117)
BILIRUBIN TOTAL: 0.4 mg/dL (ref 0.0–1.2)
BUN / CREAT RATIO: 13 (ref 12–28)
BUN: 9 mg/dL (ref 8–27)
CALCIUM: 9 mg/dL (ref 8.7–10.3)
CHLORIDE: 96 mmol/L (ref 96–106)
CO2: 25 mmol/L (ref 18–29)
Creatinine, Ser: 0.72 mg/dL (ref 0.57–1.00)
GFR, EST AFRICAN AMERICAN: 94 mL/min/{1.73_m2} (ref >59)
GFR, EST NON AFRICAN AMERICAN: 82 mL/min/{1.73_m2} (ref >59)
GLOBULIN, TOTAL: 3 g/dL (ref 1.5–4.5)
Glucose: 88 mg/dL (ref 65–99)
POTASSIUM: 4.4 mmol/L (ref 3.5–5.2)
SODIUM: 137 mmol/L (ref 134–144)
Total Protein: 7 g/dL (ref 6.0–8.5)

## 2016-06-15 LAB — LIPID PANEL
CHOL/HDL RATIO: 2.9 ratio (ref 0.0–4.4)
CHOLESTEROL TOTAL: 179 mg/dL (ref 100–199)
HDL: 61 mg/dL (ref >39)
LDL Calculated: 93 mg/dL (ref 0–99)
TRIGLYCERIDES: 124 mg/dL (ref 0–149)
VLDL Cholesterol Cal: 25 mg/dL (ref 5–40)

## 2016-06-15 LAB — TSH: TSH: 9.58 u[IU]/mL — ABNORMAL HIGH (ref 0.450–4.500)

## 2016-06-16 ENCOUNTER — Encounter: Payer: Self-pay | Admitting: Family Medicine

## 2016-06-16 DIAGNOSIS — E038 Other specified hypothyroidism: Secondary | ICD-10-CM | POA: Insufficient documentation

## 2016-06-16 DIAGNOSIS — E039 Hypothyroidism, unspecified: Secondary | ICD-10-CM | POA: Insufficient documentation

## 2016-06-19 ENCOUNTER — Ambulatory Visit (INDEPENDENT_AMBULATORY_CARE_PROVIDER_SITE_OTHER): Payer: Medicare PPO

## 2016-06-19 DIAGNOSIS — I4891 Unspecified atrial fibrillation: Secondary | ICD-10-CM

## 2016-06-19 DIAGNOSIS — Z7901 Long term (current) use of anticoagulants: Secondary | ICD-10-CM | POA: Diagnosis not present

## 2016-06-19 DIAGNOSIS — Z5181 Encounter for therapeutic drug level monitoring: Secondary | ICD-10-CM

## 2016-06-19 DIAGNOSIS — I480 Paroxysmal atrial fibrillation: Secondary | ICD-10-CM

## 2016-06-19 DIAGNOSIS — Z9889 Other specified postprocedural states: Secondary | ICD-10-CM | POA: Diagnosis not present

## 2016-06-19 DIAGNOSIS — G459 Transient cerebral ischemic attack, unspecified: Secondary | ICD-10-CM

## 2016-06-19 LAB — POCT INR: INR: 2.9

## 2016-07-10 ENCOUNTER — Ambulatory Visit (INDEPENDENT_AMBULATORY_CARE_PROVIDER_SITE_OTHER): Payer: Medicare PPO

## 2016-07-10 DIAGNOSIS — I480 Paroxysmal atrial fibrillation: Secondary | ICD-10-CM

## 2016-07-10 DIAGNOSIS — Z9889 Other specified postprocedural states: Secondary | ICD-10-CM

## 2016-07-10 DIAGNOSIS — Z7901 Long term (current) use of anticoagulants: Secondary | ICD-10-CM | POA: Diagnosis not present

## 2016-07-10 DIAGNOSIS — G459 Transient cerebral ischemic attack, unspecified: Secondary | ICD-10-CM

## 2016-07-10 DIAGNOSIS — I4891 Unspecified atrial fibrillation: Secondary | ICD-10-CM | POA: Diagnosis not present

## 2016-07-10 DIAGNOSIS — Z5181 Encounter for therapeutic drug level monitoring: Secondary | ICD-10-CM | POA: Diagnosis not present

## 2016-07-10 LAB — POCT INR: INR: 3.7

## 2016-07-29 ENCOUNTER — Other Ambulatory Visit: Payer: Self-pay | Admitting: Family Medicine

## 2016-07-29 DIAGNOSIS — F419 Anxiety disorder, unspecified: Secondary | ICD-10-CM

## 2016-07-29 DIAGNOSIS — G4700 Insomnia, unspecified: Secondary | ICD-10-CM

## 2016-07-29 NOTE — Telephone Encounter (Signed)
Please call in temazepam 

## 2016-07-30 NOTE — Telephone Encounter (Signed)
Rx called in to pharmacy. 

## 2016-08-07 ENCOUNTER — Ambulatory Visit (INDEPENDENT_AMBULATORY_CARE_PROVIDER_SITE_OTHER): Payer: Medicare PPO

## 2016-08-07 DIAGNOSIS — Z7901 Long term (current) use of anticoagulants: Secondary | ICD-10-CM

## 2016-08-07 DIAGNOSIS — G459 Transient cerebral ischemic attack, unspecified: Secondary | ICD-10-CM | POA: Diagnosis not present

## 2016-08-07 DIAGNOSIS — Z9889 Other specified postprocedural states: Secondary | ICD-10-CM | POA: Diagnosis not present

## 2016-08-07 DIAGNOSIS — I4891 Unspecified atrial fibrillation: Secondary | ICD-10-CM | POA: Diagnosis not present

## 2016-08-07 DIAGNOSIS — I480 Paroxysmal atrial fibrillation: Secondary | ICD-10-CM

## 2016-08-07 DIAGNOSIS — Z5181 Encounter for therapeutic drug level monitoring: Secondary | ICD-10-CM | POA: Diagnosis not present

## 2016-08-07 LAB — POCT INR: INR: 3.5

## 2016-08-29 ENCOUNTER — Other Ambulatory Visit: Payer: Self-pay | Admitting: Cardiology

## 2016-08-29 DIAGNOSIS — Z7901 Long term (current) use of anticoagulants: Secondary | ICD-10-CM

## 2016-08-29 DIAGNOSIS — I480 Paroxysmal atrial fibrillation: Secondary | ICD-10-CM

## 2016-09-04 ENCOUNTER — Other Ambulatory Visit: Payer: Self-pay

## 2016-09-04 ENCOUNTER — Ambulatory Visit (INDEPENDENT_AMBULATORY_CARE_PROVIDER_SITE_OTHER): Payer: Medicare PPO

## 2016-09-04 DIAGNOSIS — I480 Paroxysmal atrial fibrillation: Secondary | ICD-10-CM

## 2016-09-04 DIAGNOSIS — Z7901 Long term (current) use of anticoagulants: Secondary | ICD-10-CM

## 2016-09-04 DIAGNOSIS — I4891 Unspecified atrial fibrillation: Secondary | ICD-10-CM

## 2016-09-04 DIAGNOSIS — Z5181 Encounter for therapeutic drug level monitoring: Secondary | ICD-10-CM | POA: Diagnosis not present

## 2016-09-04 DIAGNOSIS — Z9889 Other specified postprocedural states: Secondary | ICD-10-CM | POA: Diagnosis not present

## 2016-09-04 DIAGNOSIS — G459 Transient cerebral ischemic attack, unspecified: Secondary | ICD-10-CM | POA: Diagnosis not present

## 2016-09-04 LAB — POCT INR: INR: 3.9

## 2016-09-12 ENCOUNTER — Ambulatory Visit (INDEPENDENT_AMBULATORY_CARE_PROVIDER_SITE_OTHER): Payer: Medicare PPO | Admitting: Cardiology

## 2016-09-12 ENCOUNTER — Encounter: Payer: Self-pay | Admitting: Cardiology

## 2016-09-12 VITALS — BP 126/60 | HR 59 | Ht 66.0 in | Wt 166.0 lb

## 2016-09-12 DIAGNOSIS — Z952 Presence of prosthetic heart valve: Secondary | ICD-10-CM

## 2016-09-12 DIAGNOSIS — I10 Essential (primary) hypertension: Secondary | ICD-10-CM | POA: Diagnosis not present

## 2016-09-12 DIAGNOSIS — I2510 Atherosclerotic heart disease of native coronary artery without angina pectoris: Secondary | ICD-10-CM | POA: Diagnosis not present

## 2016-09-12 DIAGNOSIS — I480 Paroxysmal atrial fibrillation: Secondary | ICD-10-CM

## 2016-09-12 DIAGNOSIS — I350 Nonrheumatic aortic (valve) stenosis: Secondary | ICD-10-CM

## 2016-09-12 MED ORDER — METOPROLOL TARTRATE 25 MG PO TABS: 25.0000 mg | ORAL_TABLET | Freq: Two times a day (BID) | ORAL | 6 refills | Status: DC

## 2016-09-12 NOTE — Progress Notes (Signed)
Cardiology Office Note   Date:  09/12/2016   ID:  [name] [name], DOB 1941/01/05, MRN [id number]  Referring Doctor:  Mila Merryonald Fisher, MD   Cardiologist:   Almond LintAileen Adriell Polansky, MD   Reason for consultation:  Chief Complaint  Patient presents with  . other    6 month follow up. Patient c/o SOB. Meds reviewed verbally with patient.       History of Present Illness: [name] [name] is a 76 y.o. female who presents for follow-up for atrial fibrillation, hypertension, mechanical mitral valve  - afib -- No palpitations.  -- History of mechanical mitral valve -- patient continues to follow up with Coumadin clinic, no recurrence of bleeding. Husband says that the INR targets were 3-4 and she was found to have mini strokes despite target of 2.5-3.5.   No chest pain or shortness of breath. No loss of consciousness no swelling in the legs. No orthopnea or PND.   ROS:  Please see the history of present illness. Aside from mentioned under HPI, all other systems are reviewed and negative.      Past Medical History:  Diagnosis Date  . Anxiety   . Carotid stenosis    a. Carotid US (9/14):  Bilat 1-39% => f/u PRN  . Dementia   . Depression   . Gastroesophageal reflux disease   . History of echocardiogram    Echo 2/17: mild LVH, EF 55-60%, mild to mod AS (mean 14 mmHg), mechanical MVR ok (mean 7 mmHgg), mild LAE, PASP 58 mmHg  . HLD (hyperlipidemia)   . HTN (hypertension)   . Mitral regurgitation    a. s/p Bjork Shiley mechanical mitral valve replacement in 1980.  . 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 non-obstructive by cath 2007.  [name]. PAF (paroxysmal atrial fibrillation) (HCC)    on chronic coumadin  . Paroxysmal atrial flutter (HCC)    s/p RFA x 2  . TIA (transient ischemic attack)   . Tobacco abuse     Past Surgical History:  Procedure Laterality Date  . A FLUTTER ABLATION     x 2  . ESOPHAGOGASTRODUODENOSCOPY N/A 06/04/2015   Procedure:  ESOPHAGOGASTRODUODENOSCOPY (EGD);  Surgeon: Iva Booparl E Gessner, MD;  Location: [facility];  Service: Endoscopy;  Laterality: N/A;  . HIP ARTHROPLASTY Left 05/07/2014   Procedure: ARTHROPLASTY MONOPOLAR HIP;  Surgeon: Eldred MangesMark C Yates, MD;  Location: [facility] OR;  Service: Orthopedics;  Laterality: Left;  . MITRAL VALVE REPLACEMENT     Bjork-Shiley valve placed for mitral regurgitation     reports that she quit smoking about 12 months ago. Her smoking use included Cigarettes. She has a 35.00 pack-year smoking history. She has quit using smokeless tobacco. She reports that she does not drink alcohol or use drugs.   family history includes Heart attack in her other.   Current Outpatient Prescriptions  Medication Sig Dispense Refill  . ALPRAZolam (XANAX) 0.5 MG tablet TAKE 1/2 TO 1 TABLET BY MOUTH EVERY 6 HOURS AS NEEDED FOR ANXIETY 60 tablet 5  . Ascorbic Acid (VITAMIN C) 500 MG tablet Take 500 mg by mouth daily.      [name]. aspirin EC 81 MG EC tablet Take 81 mg by mouth daily.      [name]. atorvastatin (LIPITOR) 40 MG tablet TAKE 1 TABLET BY MOUTH ONCE A DAY 90 tablet 4  . buPROPion (WELLBUTRIN) 75 MG tablet TAKE 1 TABLET BY MOUTH EVERY MORNING 90 tablet 4  . Cyanocobalamin (VITAMIN B 12) 250 MCG  LOZG Take 500 mg by mouth daily.      [name] diltiazem (CARDIZEM CD) 240 MG 24 hr capsule Take 1 capsule (240 mg total) by mouth daily. 90 capsule 3  . diphenhydramine-acetaminophen (TYLENOL PM EXTRA STRENGTH) 25-500 MG TABS tablet Take 1 tablet by mouth at bedtime.     [name] escitalopram (LEXAPRO) 20 MG tablet TAKE 1 TABLET BY MOUTH ONCE A DAY 90 tablet 4  . haloperidol (HALDOL) 1 MG tablet TAKE 1/2 TABLET BY MOUTH ONCE OR TWICE ADAY AS NEEDED FOR ANXIETY 15 tablet 12  . lisinopril (PRINIVIL,ZESTRIL) 10 MG tablet TAKE 1 TABLET BY MOUTH ONCE A DAY 30 tablet 3  . Multiple Vitamins-Calcium (EQL ONE DAILY WOMENS PO) Take 1 tablet by mouth daily.      . pantoprazole (PROTONIX) 40 MG tablet TAKE 1 TABLET BY MOUTH DAILY BEFORE BREAKFAST  90 tablet 4  . temazepam (RESTORIL) 30 MG capsule TAKE 1 CAPSULE BY MOUTH NIGHTLY BEFORE BEDTIME AS NEEDED FOR SLEEP. 30 capsule 5  . traZODone (DESYREL) 100 MG tablet TAKE 1 TABLET BY MOUTH AT BEDTIME 30 tablet 12  . warfarin (COUMADIN) 5 MG tablet Take 1/2 to 1 tablet daily as directed by physician 30 tablet 12  . metoprolol tartrate (LOPRESSOR) 25 MG tablet Take 1 tablet (25 mg total) by mouth 2 (two) times daily. 60 tablet 6   No current facility-administered medications for this visit.     Allergies: Codeine; Aspirin; and Penicillins    PHYSICAL EXAM: VS:  BP 126/60 (BP Location: Left Arm, Patient Position: Sitting, Cuff Size: Normal)   Pulse (!) 59   Ht 5\' 6"  (1.676 m)   Wt 166 lb (75.3 kg)   BMI 26.79 kg/m  , Body mass index is 26.79 kg/m. Wt Readings from Last 3 Encounters:  09/12/16 166 lb (75.3 kg)  06/14/16 170 lb (77.1 kg)  02/28/16 151 lb (68.5 kg)   PHYSICAL EXAM: VS:  BP 126/60 (BP Location: Left Arm, Patient Position: Sitting, Cuff Size: Normal)   Pulse (!) 59   Ht 5\' 6"  (1.676 m)   Wt 166 lb (75.3 kg)   BMI 26.79 kg/m  , BMI Body mass index is 26.79 kg/m. GENERAL:  well developed, well nourished, obese, not in acute distress HEENT: normocephalic, pink conjunctivae, anicteric sclerae, no xanthelasma, normal dentition, oropharynx clear NECK:  no neck vein engorgement, JVP normal, no hepatojugular reflux, carotid upstroke brisk and symmetric, no bruit, no thyromegaly, no lymphadenopathy LUNGS:  good respiratory effort, clear to auscultation bilaterally CV:  PMI not displaced, no thrills, no lifts, S1 and S2 within normal limits, no palpable S3 or S4, no rubs, no gallops, 3/6 systolic ejection murmur, minimally radiating up to the neck, no delay in radial artery pulse or carotid pulse bilaterally radial pulses +2  ABD:  Soft, nontender, nondistended, normoactive bowel sounds, no abdominal aortic bruit, no hepatomegaly, no splenomegaly MS: nontender back, no  kyphosis, no scoliosis, no joint deformities EXT:  2+ DP/PT pulses, no edema, no varicosities, no cyanosis, no clubbing SKIN: warm, nondiaphoretic, normal turgor, no ulcers NEUROPSYCH: alert, oriented to person, place, and time, sensory/motor grossly intact, normal mood, appropriate affect    Recent Labs: 03/13/2016: Platelets 237 06/14/2016: ALT 13; BUN 9; Creatinine, Ser 0.72; Potassium 4.4; Sodium 137; TSH 9.580   Lipid Panel    Component Value Date/Time   CHOL 179 06/14/2016 1439   TRIG 124 06/14/2016 1439   HDL 61 06/14/2016 1439   CHOLHDL 2.9 06/14/2016 1439   CHOLHDL  4 01/25/2014 1644   VLDL 13.4 01/25/2014 1644   LDLCALC 93 06/14/2016 1439   LDLDIRECT 226.0 04/08/2011 1237     Other studies Reviewed:  EKG:   EKG from 08/23/2015 was personally reviewed by me and it showed A. fib 61 BPM. Nonspecific ST changes similar to before.  The ekg From 07/20/2015 was personally reviewed by me and it reveals appear to be sinus rhythm, 61 BPM, PACs. Poor R-wave progression.   EKG from 02/28/2016 was personally reviewed by me and it showed sinus bradycardic, 49 BPM. LAD. Nonspecific ST-T wave changes.  Additional studies/ records that were reviewed personally reviewed by me today include:   Echo 06/22/2015: Mild concentric hypertrophy. LVEF 55-60%. Mild to moderate AS, mean gradient 14 mmHg, valve area by VTI 1.15 cm. Mechanical mitral valve prosthesis present and functioning normally. Mean gradient 7 mmHg. LA mildly dilated. PA pressure 58 mmHg.  Carotid ultrasound 08/09/2015: Heterogeneous plaque, bilaterally. Stable 1-39% ICA stenosis, bilaterally. > 50% RECA stenosis. Patent vertebral arteries with antegrade flow. Normal right subclavian artery. Elevated velocities left subclavian artery.  Echo 09/04/2016: Left ventricle: The cavity size was normal. Wall thickness was   increased in a pattern of moderate LVH. Systolic function was   normal. The estimated ejection fraction  was in the range of 60%   to 65%. Wall motion was normal; there were no regional wall   motion abnormalities. The study is not technically sufficient to   allow evaluation of LV diastolic function. - Aortic valve: Poorly visualized. Moderately thickened leaflets.   There was mild stenosis. There was trivial regurgitation. - Mitral valve: A mechanical prosthesis was present. There was no   significant regurgitation. Mean gradient (D): 3 mm Hg. Valve area   by continuity equation (using LVOT flow): 2.25 cm^2. - Left atrium: The atrium was moderately dilated. - Right ventricle: The cavity size was normal. Systolic function   was normal. - Pulmonary arteries: Systolic pressure was moderately increased,   estimated to be 60 mm Hg.   ASSESSMENT AND PLAN:  - s/p  Mechanical mitral valve - Continue warfarin goal INR 3-4 as per husband's request and ASA 81 daily. Husband mentions that that goal was to increase many years ago when patient was found to have mini strokes on the usual INR goal. She needs Lovenox if she has to come off of Coumadin electively. Continue SBE prophylaxis. Continue follow-up with coumadin clinic. No change from recommendation from February 28, 2016.  -Paroxysmal AFib/Flutter, sinus bradycardia -  Asymptomatic by history. She remains on Warfarin. Patient has limited functional capacity, mainly due to her age as well as her dementia. Continue Cardizem CD 240 mg daily. Decrease metoprolol to 25 twice a day since her heart rate has remained in the 50s to 60s. Recommend that they check blood pressure and heart rate at home. Patient to call our office if for some reason the heart rate at home is in the 80s or 90s.  - CAD - Non-obstructive by LHC in the past. No angina. Continue ASA, statin, beta-blocker. No significant change from recommendation from February 28, 2016  -Aortic stenosis - mild in severity on most recent echo. Continue to monitor.  Pulmonary hypertension  noted on most recent echo as well as previous echoes. Patient does not have any evidence of CHF. Most likely this is related to lung disease.  - Hypertension-BP is well controlled. Continue monitoring BP. Continue current medical therapy and lifestyle changes.  -Hyperlipidemia - PCP following labs.  -  carotid stenosis - Bilateral ICA 1-39%, stable. Continue medical therapy.  Abnormal cardiovascular test results /Elevated velocities of the left subclavian artery noted on carotid ultrasound Findings of the carotid ultrasound from 08/09/2015 were discussed with patient. These are also noted on previous ultrasounds. Patient has equal pulses on both radial arteries. Recommend that the CT a of the left subclavian artery may be done for further evaluation. Husband and patient declined to proceed with this. No change in recommendation from 02/28/2016.  Current medicines are reviewed at length with the patient today.  The patient does not have concerns regarding medicines.  Labs/ tests ordered today include:  Orders Placed This Encounter  Procedures  . EKG 12-Lead    I had a lengthy and detailed discussion with the patient regarding diagnoses, prognosis, diagnostic options, treatment options, and side effects of medications.   I counseled the patient on importance of lifestyle modification including heart healthy diet, regular physical activity  Disposition:   FU with cardiology in 6 months   Signed, Almond Lint, MD  09/12/2016 11:07 AM    [facility]

## 2016-09-12 NOTE — Patient Instructions (Signed)
Medication Instructions:  Your physician has recommended you make the following change in your medication:  1. DECREASE Metoprolol tartrate to 25 mg twice a day  Your physician has requested that you regularly monitor and record your blood pressure readings at home. Please use the same machine at the same time of day to check your readings and record them to bring to your follow-up visit.  Please call if heart rate remains 80-90's   Follow-Up: Your physician wants you to follow-up in: 6 months with Dr. Okey DupreEnd. You will receive a reminder letter in the mail two months in advance. If you don't receive a letter, please call our office to schedule the follow-up appointment.  It was a pleasure seeing you today here in the office. Please do not hesitate to give us a call back if you have any further questions. [phone]  [place] CellarPamela A. RN, BSN

## 2016-10-02 ENCOUNTER — Ambulatory Visit (INDEPENDENT_AMBULATORY_CARE_PROVIDER_SITE_OTHER): Payer: Medicare PPO

## 2016-10-02 DIAGNOSIS — I480 Paroxysmal atrial fibrillation: Secondary | ICD-10-CM

## 2016-10-02 DIAGNOSIS — Z7901 Long term (current) use of anticoagulants: Secondary | ICD-10-CM

## 2016-10-02 DIAGNOSIS — G459 Transient cerebral ischemic attack, unspecified: Secondary | ICD-10-CM

## 2016-10-02 DIAGNOSIS — Z9889 Other specified postprocedural states: Secondary | ICD-10-CM | POA: Diagnosis not present

## 2016-10-02 DIAGNOSIS — Z5181 Encounter for therapeutic drug level monitoring: Secondary | ICD-10-CM | POA: Diagnosis not present

## 2016-10-02 DIAGNOSIS — I4891 Unspecified atrial fibrillation: Secondary | ICD-10-CM | POA: Diagnosis not present

## 2016-10-02 LAB — POCT INR: INR: 3.5

## 2016-10-04 ENCOUNTER — Other Ambulatory Visit: Payer: Self-pay

## 2016-10-04 ENCOUNTER — Other Ambulatory Visit: Payer: Self-pay | Admitting: Family Medicine

## 2016-10-04 MED ORDER — LISINOPRIL 10 MG PO TABS: 10.0000 mg | ORAL_TABLET | Freq: Every day | ORAL | 3 refills | Status: DC

## 2016-10-04 NOTE — Telephone Encounter (Signed)
Refill sent for Lisinopril 10 mg

## 2016-10-04 NOTE — Telephone Encounter (Signed)
Please call in alprazolam.  

## 2016-10-04 NOTE — Telephone Encounter (Signed)
Called in medication as below.  

## 2016-10-11 IMAGING — CT CT ABD-PELV W/O CM
2 of 7 series · 14 of 46 positions shown, 18 images · non-contrast
Comparison: CT abdomen/ pelvis 04/08/2009.

CLINICAL DATA: Decreasing hemoglobin. Question retroperitoneal
bleed. Recent left hip surgery for fracture after fall.

EXAM:
CT ABDOMEN AND PELVIS WITHOUT CONTRAST
TECHNIQUE: Multidetector CT imaging of the abdomen and pelvis was performed
following the standard protocol without IV contrast.

[Series 2: abd/pelvis without · axial · non-contrast · 0.70mm/px · z∈[-376,-26]mm · 11 of 82 slices shown, 15 images]
[im 6/82  soft-tissue]
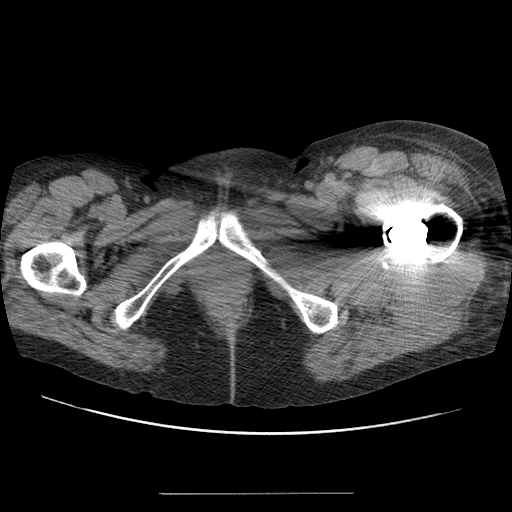
[im 6/82  bone]
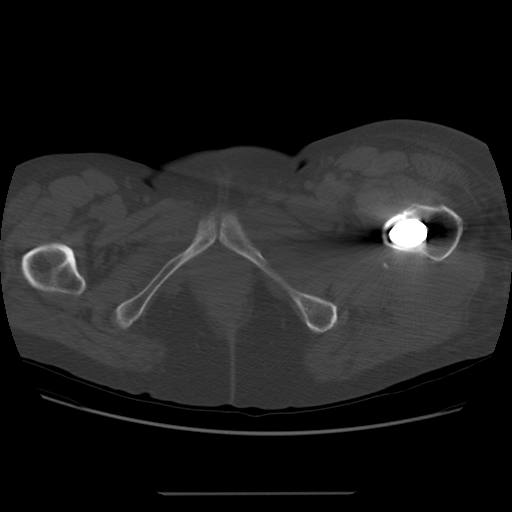
[im 16/82  soft-tissue]
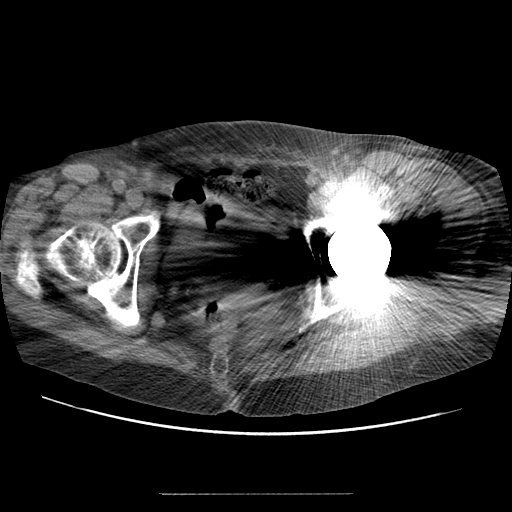
[im 26/82  soft-tissue]
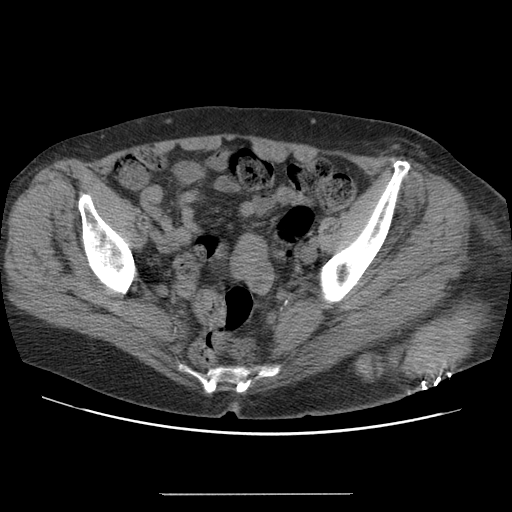
[im 31/82  soft-tissue]
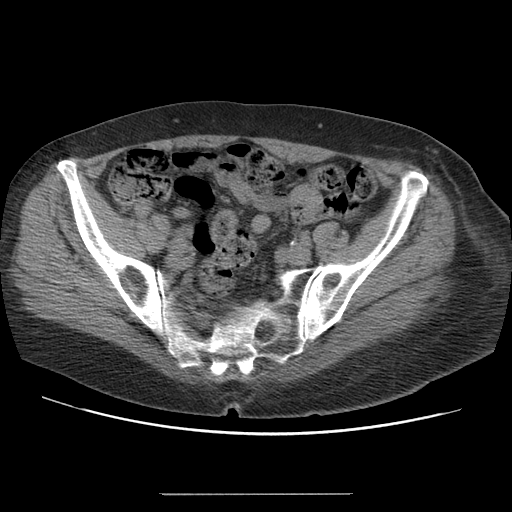
[im 41/82  soft-tissue]
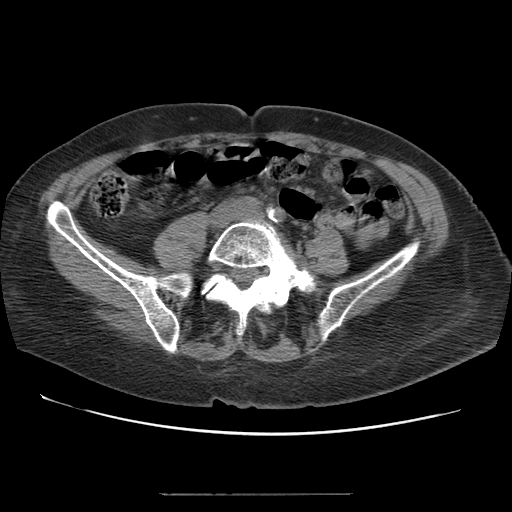
[im 51/82  soft-tissue]
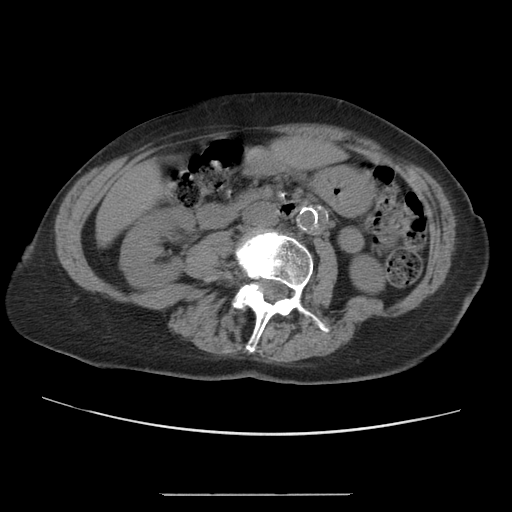
[im 56/82  soft-tissue]
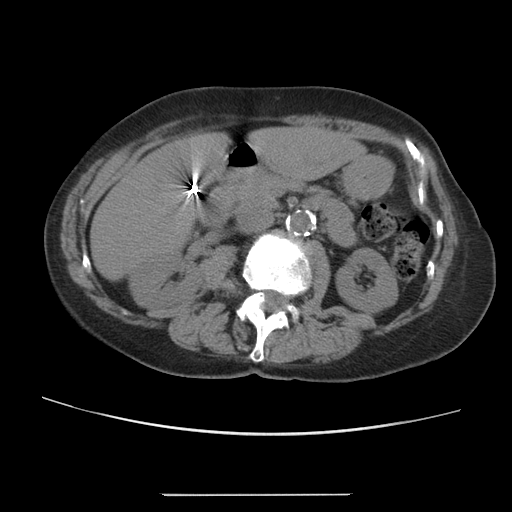
[im 61/82  lung]
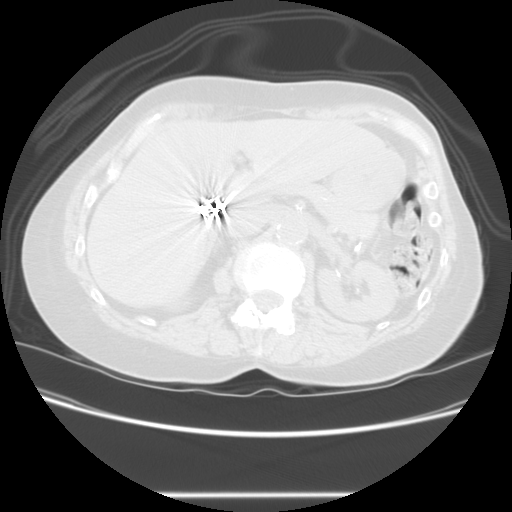
[im 66/82  soft-tissue]
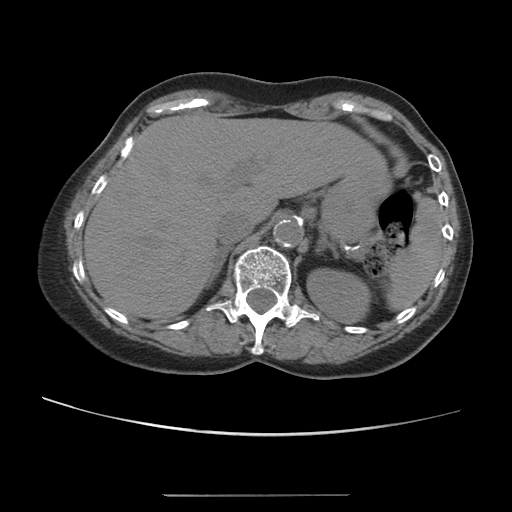
[im 66/82  lung]
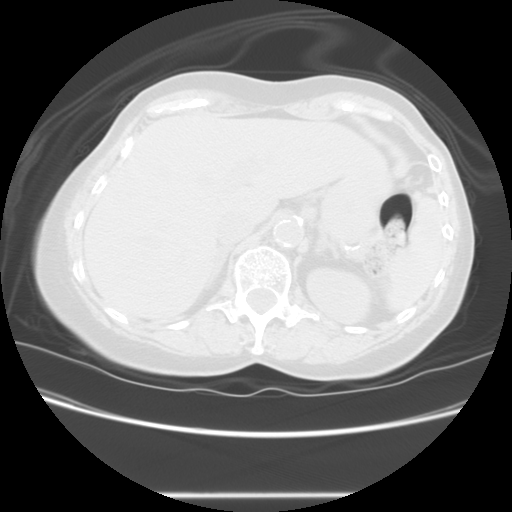
[im 71/82  lung]
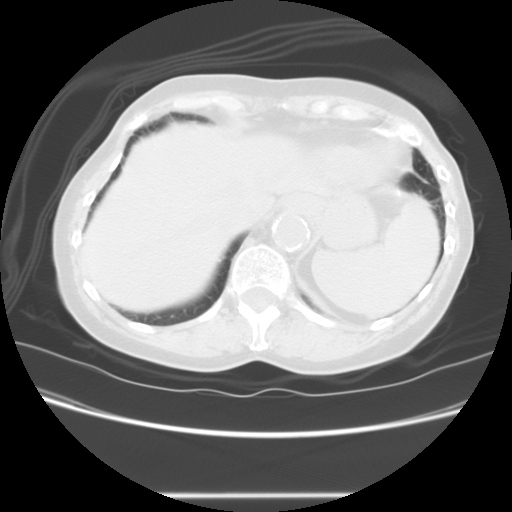
[im 76/82  soft-tissue]
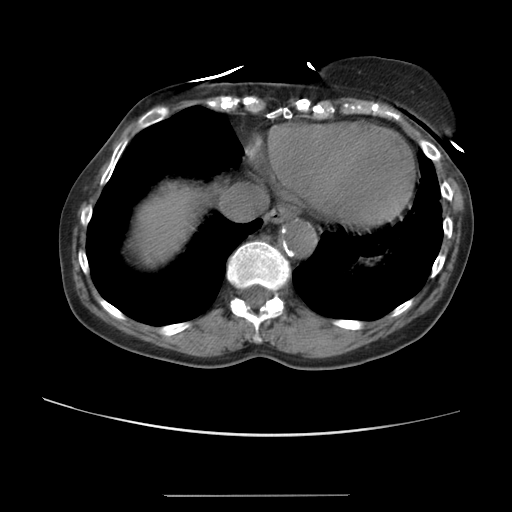
[im 76/82  lung]
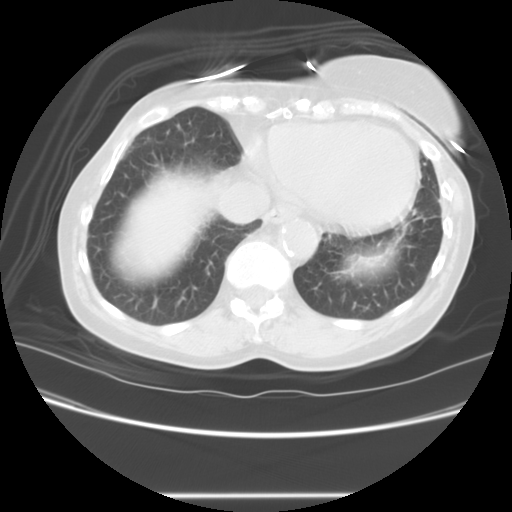
[im 76/82  bone]
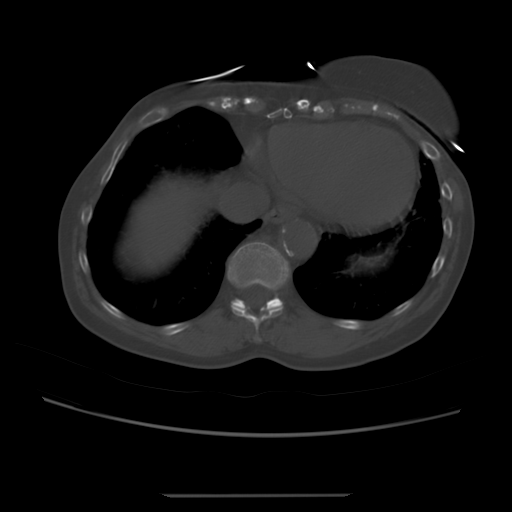

[Series 400: cor · coronal · 0.81mm/px · 3 of 133 slices shown]
[im 34/133  soft-tissue]
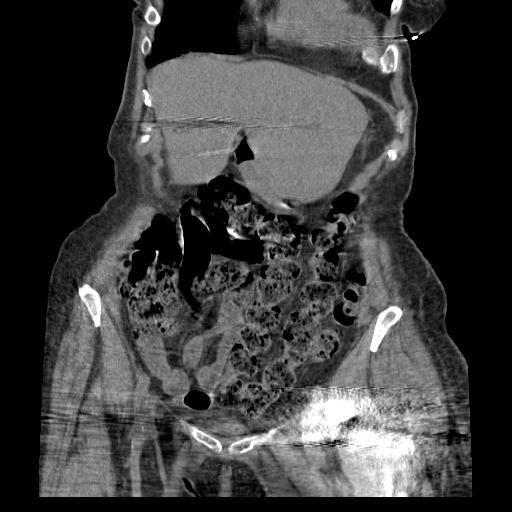
[im 67/133  soft-tissue]
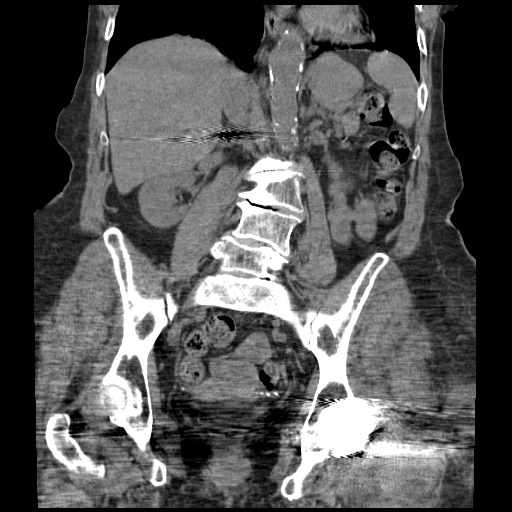
[im 100/133  soft-tissue]
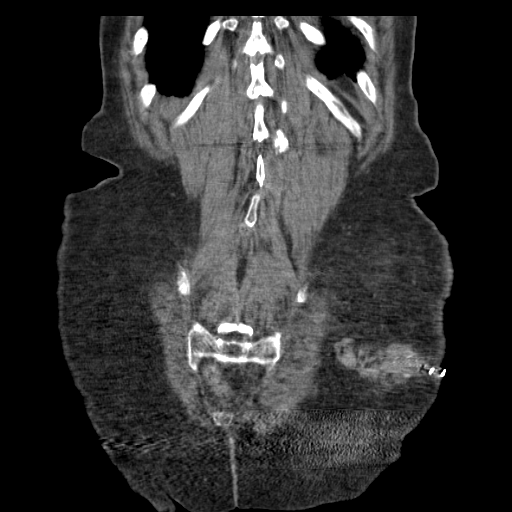

[14 of 46 positions shown; findings below may reference images not displayed]

FINDINGS: Emphysematous changes noted at the included lung bases. The heart is
enlarged. There is atherosclerosis of the descending thoracic aorta.
No consolidation or pleural effusion at the lung bases.

There is no retroperitoneal hematoma. Retroperitoneal fat planes are
preserved. The psoas muscles are normal.

Within the subcutaneous tissues of the left hip superficial to the
gluteal musculature is an elongated heterogeneous fluid collection
with fluid fluid level most consistent with hematoma. This is
immediately subjacent to a skin staples. Exact measurements are
difficult due to the irregular and elongated nature, however
measures at least 9.3 x 4.4 cm. There is small focus of air in the
inferior most aspect of this fluid collection that is likely
postsurgical. There is mild diffuse soft tissue edema about the left
flank and left hip. Intramuscular edema in the periarticular
musculature.

There is no free intra-abdominal/pelvic air or free fluid. No
intra-abdominal or pelvic fluid collection.

The unenhanced liver, spleen, adrenal glands, pancreas, and kidneys
are unremarkable. Clips in the gallbladder fossa from
cholecystectomy. There are no dilated or thickened bowel loops.
Moderate stool throughout the colon. The appendix is tentatively
identified and normal. Atherosclerosis of the abdominal aorta and
iliac vasculature without aneurysm.

Urinary bladder appears physiologically distended. Uterus remains in
situ. Detailed pelvic evaluation is partially obscured by streak
artifact from left hip prosthesis.

The bones appear under mineralized. There is multilevel degenerative
disc disease and facet arthropathy throughout the lumbar spine.
IMPRESSION: 1. Subcutaneous hematoma in the left gluteal soft tissues subjacent
to skin staples. This measures at least 9.3 cm. There is no
retroperitoneal hematoma.
2. No acute intra-abdominal/pelvic abnormality.

## 2016-10-30 ENCOUNTER — Ambulatory Visit (INDEPENDENT_AMBULATORY_CARE_PROVIDER_SITE_OTHER): Payer: Medicare PPO | Admitting: *Deleted

## 2016-10-30 DIAGNOSIS — Z9889 Other specified postprocedural states: Secondary | ICD-10-CM | POA: Diagnosis not present

## 2016-10-30 DIAGNOSIS — I480 Paroxysmal atrial fibrillation: Secondary | ICD-10-CM

## 2016-10-30 DIAGNOSIS — Z7901 Long term (current) use of anticoagulants: Secondary | ICD-10-CM | POA: Diagnosis not present

## 2016-10-30 DIAGNOSIS — G459 Transient cerebral ischemic attack, unspecified: Secondary | ICD-10-CM

## 2016-10-30 DIAGNOSIS — I4891 Unspecified atrial fibrillation: Secondary | ICD-10-CM

## 2016-10-30 DIAGNOSIS — Z5181 Encounter for therapeutic drug level monitoring: Secondary | ICD-10-CM

## 2016-10-30 LAB — POCT INR: INR: 4.1

## 2016-11-27 ENCOUNTER — Ambulatory Visit (INDEPENDENT_AMBULATORY_CARE_PROVIDER_SITE_OTHER): Payer: Medicare PPO | Admitting: *Deleted

## 2016-11-27 DIAGNOSIS — G459 Transient cerebral ischemic attack, unspecified: Secondary | ICD-10-CM | POA: Diagnosis not present

## 2016-11-27 DIAGNOSIS — Z5181 Encounter for therapeutic drug level monitoring: Secondary | ICD-10-CM

## 2016-11-27 DIAGNOSIS — I480 Paroxysmal atrial fibrillation: Secondary | ICD-10-CM | POA: Diagnosis not present

## 2016-11-27 DIAGNOSIS — Z7901 Long term (current) use of anticoagulants: Secondary | ICD-10-CM

## 2016-11-27 DIAGNOSIS — I4891 Unspecified atrial fibrillation: Secondary | ICD-10-CM

## 2016-11-27 DIAGNOSIS — Z9889 Other specified postprocedural states: Secondary | ICD-10-CM

## 2016-11-27 LAB — POCT INR: INR: 5.2

## 2016-12-11 ENCOUNTER — Ambulatory Visit (INDEPENDENT_AMBULATORY_CARE_PROVIDER_SITE_OTHER): Payer: Medicare PPO

## 2016-12-11 DIAGNOSIS — G459 Transient cerebral ischemic attack, unspecified: Secondary | ICD-10-CM

## 2016-12-11 DIAGNOSIS — I480 Paroxysmal atrial fibrillation: Secondary | ICD-10-CM

## 2016-12-11 DIAGNOSIS — I4891 Unspecified atrial fibrillation: Secondary | ICD-10-CM | POA: Diagnosis not present

## 2016-12-11 DIAGNOSIS — Z5181 Encounter for therapeutic drug level monitoring: Secondary | ICD-10-CM

## 2016-12-11 DIAGNOSIS — Z9889 Other specified postprocedural states: Secondary | ICD-10-CM

## 2016-12-11 DIAGNOSIS — Z7901 Long term (current) use of anticoagulants: Secondary | ICD-10-CM

## 2016-12-11 LAB — POCT INR: INR: 3.5

## 2016-12-31 ENCOUNTER — Other Ambulatory Visit: Payer: Self-pay | Admitting: Family Medicine

## 2016-12-31 NOTE — Telephone Encounter (Signed)
Please review. Thanks!  

## 2017-01-01 ENCOUNTER — Ambulatory Visit (INDEPENDENT_AMBULATORY_CARE_PROVIDER_SITE_OTHER): Payer: Medicare PPO | Admitting: *Deleted

## 2017-01-01 DIAGNOSIS — Z9889 Other specified postprocedural states: Secondary | ICD-10-CM | POA: Diagnosis not present

## 2017-01-01 DIAGNOSIS — I480 Paroxysmal atrial fibrillation: Secondary | ICD-10-CM | POA: Diagnosis not present

## 2017-01-01 DIAGNOSIS — G459 Transient cerebral ischemic attack, unspecified: Secondary | ICD-10-CM | POA: Diagnosis not present

## 2017-01-01 DIAGNOSIS — Z7901 Long term (current) use of anticoagulants: Secondary | ICD-10-CM

## 2017-01-01 DIAGNOSIS — Z5181 Encounter for therapeutic drug level monitoring: Secondary | ICD-10-CM | POA: Diagnosis not present

## 2017-01-01 DIAGNOSIS — I4891 Unspecified atrial fibrillation: Secondary | ICD-10-CM | POA: Diagnosis not present

## 2017-01-01 LAB — POCT INR: INR: 5.2

## 2017-01-03 ENCOUNTER — Ambulatory Visit (INDEPENDENT_AMBULATORY_CARE_PROVIDER_SITE_OTHER): Payer: Medicare PPO

## 2017-01-03 VITALS — BP 114/64 | HR 60 | Temp 98.6°F | Ht 66.0 in | Wt 172.2 lb

## 2017-01-03 DIAGNOSIS — Z0000 Encounter for general adult medical examination without abnormal findings: Secondary | ICD-10-CM | POA: Diagnosis not present

## 2017-01-03 NOTE — Patient Instructions (Signed)
[name] [name] , Thank you for taking time to come for your Medicare Wellness Visit. I appreciate your ongoing commitment to your health goals. Please review the following plan we discussed and let me know if I can assist you in the future.   Screening recommendations/referrals: Colonoscopy: up to date Mammogram: no longer required Bone Density: up to date Recommended yearly ophthalmology/optometry visit for glaucoma screening and checkup Recommended yearly dental visit for hygiene and checkup  Vaccinations: Influenza vaccine: declined Pneumococcal vaccine: completed series Tdap vaccine: declined Shingles vaccine: declined  Advanced directives: Advance directive discussed with you today. Even though you declined this today please call our office should you change your mind and we can give you the proper paperwork for you to fill out.  Conditions/risks identified: Fall risk prevention; Recommend increasing water intake to 4-6 glasses a day.  Next appointment: None, need to schedule a physical with the PCP and 1 year AWV.   Preventive Care 55 Years and Older, Female Preventive care refers to lifestyle choices and visits with your health care provider that can promote health and wellness. What does preventive care include?  A yearly physical exam. This is also called an annual well check.  Dental exams once or twice a year.  Routine eye exams. Ask your health care provider how often you should have your eyes checked.  Personal lifestyle choices, including:  Daily care of your teeth and gums.  Regular physical activity.  Eating a healthy diet.  Avoiding tobacco and drug use.  Limiting alcohol use.  Practicing safe sex.  Taking low-dose aspirin every day.  Taking vitamin and mineral supplements as recommended by your health care provider. What happens during an annual well check? The services and screenings done by your health care provider during your annual well check  will depend on your age, overall health, lifestyle risk factors, and family history of disease. Counseling  Your health care provider may ask you questions about your:  Alcohol use.  Tobacco use.  Drug use.  Emotional well-being.  Home and relationship well-being.  Sexual activity.  Eating habits.  History of falls.  Memory and ability to understand (cognition).  Work and work Astronomer.  Reproductive health. Screening  You may have the following tests or measurements:  Height, weight, and BMI.  Blood pressure.  Lipid and cholesterol levels. These may be checked every 5 years, or more frequently if you are over 17 years old.  Skin check.  Lung cancer screening. You may have this screening every year starting at age 7 if you have a 30-pack-year history of smoking and currently smoke or have quit within the past 15 years.  Fecal occult blood test (FOBT) of the stool. You may have this test every year starting at age 22.  Flexible sigmoidoscopy or colonoscopy. You may have a sigmoidoscopy every 5 years or a colonoscopy every 10 years starting at age 76.  Hepatitis C blood test.  Hepatitis B blood test.  Sexually transmitted disease (STD) testing.  Diabetes screening. This is done by checking your blood sugar (glucose) after you have not eaten for a while (fasting). You may have this done every 1-3 years.  Bone density scan. This is done to screen for osteoporosis. You may have this done starting at age 81.  Mammogram. This may be done every 1-2 years. Talk to your health care provider about how often you should have regular mammograms. Talk with your health care provider about your test results, treatment options, and if  necessary, the need for more tests. Vaccines  Your health care provider may recommend certain vaccines, such as:  Influenza vaccine. This is recommended every year.  Tetanus, diphtheria, and acellular pertussis (Tdap, Td) vaccine. You may  need a Td booster every 10 years.  Zoster vaccine. You may need this after age 57.  Pneumococcal 13-valent conjugate (PCV13) vaccine. One dose is recommended after age 74.  Pneumococcal polysaccharide (PPSV23) vaccine. One dose is recommended after age 23. Talk to your health care provider about which screenings and vaccines you need and how often you need them. This information is not intended to replace advice given to you by your health care provider. Make sure you discuss any questions you have with your health care provider. Document Released: 05/26/2015 Document Revised: 01/17/2016 Document Reviewed: 02/28/2015 Elsevier Interactive Patient Education  2017 [employer] Prevention in the Home Falls can cause injuries. They can happen to people of all ages. There are many things you can do to make your home safe and to help prevent falls. What can I do on the outside of my home?  Regularly fix the edges of walkways and driveways and fix any cracks.  Remove anything that might make you trip as you walk through a door, such as a raised step or threshold.  Trim any bushes or trees on the path to your home.  Use bright outdoor lighting.  Clear any walking paths of anything that might make someone trip, such as rocks or tools.  Regularly check to see if handrails are loose or broken. Make sure that both sides of any steps have handrails.  Any raised decks and porches should have guardrails on the edges.  Have any leaves, snow, or ice cleared regularly.  Use sand or salt on walking paths during winter.  Clean up any spills in your garage right away. This includes oil or grease spills. What can I do in the bathroom?  Use night lights.  Install grab bars by the toilet and in the tub and shower. Do not use towel bars as grab bars.  Use non-skid mats or decals in the tub or shower.  If you need to sit down in the shower, use a plastic, non-slip stool.  Keep the floor  dry. Clean up any water that spills on the floor as soon as it happens.  Remove soap buildup in the tub or shower regularly.  Attach bath mats securely with double-sided non-slip rug tape.  Do not have throw rugs and other things on the floor that can make you trip. What can I do in the bedroom?  Use night lights.  Make sure that you have a light by your bed that is easy to reach.  Do not use any sheets or blankets that are too big for your bed. They should not hang down onto the floor.  Have a firm chair that has side arms. You can use this for support while you get dressed.  Do not have throw rugs and other things on the floor that can make you trip. What can I do in the kitchen?  Clean up any spills right away.  Avoid walking on wet floors.  Keep items that you use a lot in easy-to-reach places.  If you need to reach something above you, use a strong step stool that has a grab bar.  Keep electrical cords out of the way.  Do not use floor polish or wax that makes floors slippery. If you must  use wax, use non-skid floor wax.  Do not have throw rugs and other things on the floor that can make you trip. What can I do with my stairs?  Do not leave any items on the stairs.  Make sure that there are handrails on both sides of the stairs and use them. Fix handrails that are broken or loose. Make sure that handrails are as long as the stairways.  Check any carpeting to make sure that it is firmly attached to the stairs. Fix any carpet that is loose or worn.  Avoid having throw rugs at the top or bottom of the stairs. If you do have throw rugs, attach them to the floor with carpet tape.  Make sure that you have a light switch at the top of the stairs and the bottom of the stairs. If you do not have them, ask someone to add them for you. What else can I do to help prevent falls?  Wear shoes that:  Do not have high heels.  Have rubber bottoms.  Are comfortable and fit you  well.  Are closed at the toe. Do not wear sandals.  If you use a stepladder:  Make sure that it is fully opened. Do not climb a closed stepladder.  Make sure that both sides of the stepladder are locked into place.  Ask someone to hold it for you, if possible.  Clearly mark and make sure that you can see:  Any grab bars or handrails.  First and last steps.  Where the edge of each step is.  Use tools that help you move around (mobility aids) if they are needed. These include:  Canes.  Walkers.  Scooters.  Crutches.  Turn on the lights when you go into a dark area. Replace any light bulbs as soon as they burn out.  Set up your furniture so you have a clear path. Avoid moving your furniture around.  If any of your floors are uneven, fix them.  If there are any pets around you, be aware of where they are.  Review your medicines with your doctor. Some medicines can make you feel dizzy. This can increase your chance of falling. Ask your doctor what other things that you can do to help prevent falls. This information is not intended to replace advice given to you by your health care provider. Make sure you discuss any questions you have with your health care provider. Document Released: 02/23/2009 Document Revised: 10/05/2015 Document Reviewed: 06/03/2014 Elsevier Interactive Patient Education  2017 [employer].

## 2017-01-03 NOTE — Progress Notes (Signed)
Subjective:   [name] [name] is a 76 y.o. female who presents for Medicare Annual (Subsequent) preventive examination.  Review of Systems:  N/A        Objective:     Vitals: BP 114/64 (BP Location: Left Arm)   Pulse 60   Temp 98.6 F (37 C) (Oral)   Ht 5\' 6"  (1.676 m)   Wt 172 lb 3.2 oz (78.1 kg)   BMI 27.79 kg/m   Body mass index is 27.79 kg/m.   Tobacco History  Smoking Status  . Former Smoker  . Packs/day: 1.00  . Years: 35.00  . Types: Cigarettes  . Quit date: 08/25/2015  Smokeless Tobacco  . Former Neurosurgeon     Counseling given: Not Answered   Past Medical History:  Diagnosis Date  . Anxiety   . Carotid stenosis    a. Carotid US (9/14):  Bilat 1-39% => f/u PRN  . Dementia   . Depression   . Gastroesophageal reflux disease   . History of echocardiogram    Echo 2/17: mild LVH, EF 55-60%, mild to mod AS (mean 14 mmHg), mechanical MVR ok (mean 7 mmHgg), mild LAE, PASP 58 mmHg  . HLD (hyperlipidemia)   . HTN (hypertension)   . Mitral regurgitation    a. s/p Bjork Shiley mechanical mitral valve replacement in 1980.  . 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 non-obstructive by cath 2007.  [name] PAF (paroxysmal atrial fibrillation) (HCC)    on chronic coumadin  . Paroxysmal atrial flutter (HCC)    s/p RFA x 2  . TIA (transient ischemic attack)   . Tobacco abuse    Past Surgical History:  Procedure Laterality Date  . A FLUTTER ABLATION     x 2  . ESOPHAGOGASTRODUODENOSCOPY N/A 06/04/2015   Procedure: ESOPHAGOGASTRODUODENOSCOPY (EGD);  Surgeon: Iva Boop, MD;  Location[facility] ENDOSCOPY;  Service: Endoscopy;  Laterality: N/A;  . HIP ARTHROPLASTY Left 05/07/2014   Procedure: ARTHROPLASTY MONOPOLAR HIP;  Surgeon: Eldred Manges, MD;  Location: [facility] OR;  Service: Orthopedics;  Laterality: Left;  . MITRAL VALVE REPLACEMENT     Bjork-Shiley valve placed for mitral regurgitation   Family History  Problem Relation Age of Onset  . Heart attack Other     . Hypertension Neg Hx   . Stroke Neg Hx    History  Sexual Activity  . Sexual activity: Not on file    Outpatient Encounter Prescriptions as of 01/03/2017  Medication Sig  . ALPRAZolam (XANAX) 0.5 MG tablet TAKE 1/2 TO 1 TABLET BY MOUTH EVERY 6 HOURS AS NEEDED FOR ANXIETY  . Ascorbic Acid (VITAMIN C) 500 MG tablet Take 500 mg by mouth daily.    [name] aspirin EC 81 MG EC tablet Take 81 mg by mouth daily.    [name] atorvastatin (LIPITOR) 40 MG tablet TAKE 1 TABLET BY MOUTH ONCE A DAY  . buPROPion (WELLBUTRIN) 75 MG tablet TAKE 1 TABLET BY MOUTH EVERY MORNING  . Cyanocobalamin (VITAMIN B 12) 250 MCG LOZG Take 500 mg by mouth daily.    [name] diltiazem (CARDIZEM CD) 240 MG 24 hr capsule Take 1 capsule (240 mg total) by mouth daily.  . diphenhydramine-acetaminophen (TYLENOL PM EXTRA STRENGTH) 25-500 MG TABS tablet Take 1 tablet by mouth at bedtime.   [name] escitalopram (LEXAPRO) 20 MG tablet TAKE 1 TABLET BY MOUTH ONCE A DAY  . haloperidol (HALDOL) 1 MG tablet TAKE 1/2 TABLET BY MOUTH ONCE OR TWICE ADAY AS NEEDED FOR ANXIETY  .  lisinopril (PRINIVIL,ZESTRIL) 10 MG tablet Take 1 tablet (10 mg total) by mouth daily.  . metoprolol tartrate (LOPRESSOR) 25 MG tablet Take 1 tablet (25 mg total) by mouth 2 (two) times daily.  . Multiple Vitamins-Calcium (EQL ONE DAILY WOMENS PO) Take 1 tablet by mouth daily.    . pantoprazole (PROTONIX) 40 MG tablet TAKE 1 TABLET BY MOUTH DAILY BEFORE BREAKFAST  . temazepam (RESTORIL) 30 MG capsule TAKE 1 CAPSULE BY MOUTH NIGHTLY BEFORE BEDTIME AS NEEDED FOR SLEEP.  [name] traZODone (DESYREL) 100 MG tablet TAKE 1 TABLET BY MOUTH AT BEDTIME  . warfarin (COUMADIN) 5 MG tablet TAKE 1 TABLET BY MOUTH EVERY DAY EXCEPT ON TUESDAYS, THURSDAYS AND SUNDAYS TAKE 1/2 TABLET   No facility-administered encounter medications on file as of 01/03/2017.     Activities of Daily Living No flowsheet data found.  Patient Care Team: Malva Limes, MD as PCP - General (Family Medicine) Almond Lint,  MD as Consulting Physician (Cardiology)    Assessment:     Exercise Activities and Dietary recommendations    Goals    None     Fall Risk Fall Risk  03/27/2015 03/27/2015  Falls in the past year? Yes No  Number falls in past yr: 2 or more -  Injury with Fall? Yes -   Depression Screen PHQ 2/9 Scores 03/27/2015  PHQ - 2 Score 3  PHQ- 9 Score 9     Cognitive Function- Pt declined screening today.        Immunization History  Administered Date(s) Administered  . Influenza Split 05/22/2006  . Influenza, High Dose Seasonal PF 06/14/2016  . Pneumococcal Conjugate-13 09/22/2015  . Pneumococcal Polysaccharide-23 03/12/2013   Screening Tests Health Maintenance  Topic Date Due  . INFLUENZA VACCINE  12/11/2016  . TETANUS/TDAP  03/26/2025  . DEXA SCAN  Completed  . PNA vac Low Risk Adult  Completed      Plan:  I have personally reviewed and addressed the Medicare Annual Wellness questionnaire and have noted the following in the patient's chart:  A. Medical and social history B. Use of alcohol, tobacco or illicit drugs  C. Current medications and supplements D. Functional ability and status E.  Nutritional status F.  Physical activity G. Advance directives H. List of other physicians I.  Hospitalizations, surgeries, and ER visits in previous 12 months J.  Vitals K. Screenings such as hearing and vision if needed, cognitive and depression L. Referrals and appointments - none  In addition, I have reviewed and discussed with patient certain preventive protocols, quality metrics, and best practice recommendations. A written personalized care plan for preventive services as well as general preventive health recommendations were provided to patient.  See attached scanned questionnaire for additional information.   Signed,  Hyacinth Meeker, LPN Nurse Health Advisor   MD Recommendations: None, pt declined flu vaccine, tetanus vaccine and shingles vaccine.

## 2017-01-20 ENCOUNTER — Ambulatory Visit (INDEPENDENT_AMBULATORY_CARE_PROVIDER_SITE_OTHER): Payer: Medicare PPO

## 2017-01-20 DIAGNOSIS — Z9889 Other specified postprocedural states: Secondary | ICD-10-CM | POA: Diagnosis not present

## 2017-01-20 DIAGNOSIS — G459 Transient cerebral ischemic attack, unspecified: Secondary | ICD-10-CM

## 2017-01-20 DIAGNOSIS — Z5181 Encounter for therapeutic drug level monitoring: Secondary | ICD-10-CM

## 2017-01-20 DIAGNOSIS — Z7901 Long term (current) use of anticoagulants: Secondary | ICD-10-CM | POA: Diagnosis not present

## 2017-01-20 DIAGNOSIS — I480 Paroxysmal atrial fibrillation: Secondary | ICD-10-CM | POA: Diagnosis not present

## 2017-01-20 DIAGNOSIS — I4891 Unspecified atrial fibrillation: Secondary | ICD-10-CM | POA: Diagnosis not present

## 2017-01-20 LAB — POCT INR: INR: 4.1

## 2017-01-30 ENCOUNTER — Other Ambulatory Visit: Payer: Self-pay | Admitting: Family Medicine

## 2017-01-30 ENCOUNTER — Other Ambulatory Visit: Payer: Self-pay | Admitting: Internal Medicine

## 2017-01-30 DIAGNOSIS — G4700 Insomnia, unspecified: Secondary | ICD-10-CM

## 2017-01-30 NOTE — Telephone Encounter (Signed)
Please call in temazepam 

## 2017-01-31 NOTE — Telephone Encounter (Signed)
rx called in-Ceasar Decandia V Nico Rogness, RMA  

## 2017-02-05 ENCOUNTER — Ambulatory Visit (INDEPENDENT_AMBULATORY_CARE_PROVIDER_SITE_OTHER): Payer: Medicare PPO

## 2017-02-05 ENCOUNTER — Encounter: Payer: Self-pay | Admitting: Internal Medicine

## 2017-02-05 ENCOUNTER — Ambulatory Visit (INDEPENDENT_AMBULATORY_CARE_PROVIDER_SITE_OTHER): Payer: Medicare PPO | Admitting: Internal Medicine

## 2017-02-05 VITALS — BP 107/70 | HR 108 | Ht 66.0 in

## 2017-02-05 DIAGNOSIS — Z952 Presence of prosthetic heart valve: Secondary | ICD-10-CM

## 2017-02-05 DIAGNOSIS — G459 Transient cerebral ischemic attack, unspecified: Secondary | ICD-10-CM

## 2017-02-05 DIAGNOSIS — I4891 Unspecified atrial fibrillation: Secondary | ICD-10-CM

## 2017-02-05 DIAGNOSIS — I2510 Atherosclerotic heart disease of native coronary artery without angina pectoris: Secondary | ICD-10-CM | POA: Diagnosis not present

## 2017-02-05 DIAGNOSIS — Z7901 Long term (current) use of anticoagulants: Secondary | ICD-10-CM | POA: Diagnosis not present

## 2017-02-05 DIAGNOSIS — Z9889 Other specified postprocedural states: Secondary | ICD-10-CM

## 2017-02-05 DIAGNOSIS — Z5181 Encounter for therapeutic drug level monitoring: Secondary | ICD-10-CM

## 2017-02-05 DIAGNOSIS — I4892 Unspecified atrial flutter: Secondary | ICD-10-CM | POA: Diagnosis not present

## 2017-02-05 DIAGNOSIS — I480 Paroxysmal atrial fibrillation: Secondary | ICD-10-CM

## 2017-02-05 LAB — POCT INR: INR: 4

## 2017-02-05 MED ORDER — METOPROLOL TARTRATE 50 MG PO TABS: 50.0000 mg | ORAL_TABLET | Freq: Two times a day (BID) | ORAL | 3 refills | Status: DC

## 2017-02-05 NOTE — Patient Instructions (Signed)
Medication Instructions:  Your physician has recommended you make the following change in your medication:  1- INCREASE Metoprolol to 50 mg by mouth two times a day.   Labwork: none  Testing/Procedures: none  Follow-Up: Your physician recommends that you schedule a follow-up appointment in: 1 WEEK WITH RYAN OR CHRIS.   If you need a refill on your cardiac medications before your next appointment, please call your pharmacy.

## 2017-02-05 NOTE — Progress Notes (Signed)
Follow-up Outpatient Visit Date: 02/05/2017  Primary Care Provider: Malva Limes, MD [address] [place] [address]  Chief Complaint: "I feel cold"  HPI:  [name] [name] is a 76 y.o. year-old female with history of mitral valve disease status post mechanical MVR in 1980, nonobstructive coronary artery disease, paroxysmal atrial fibrillation and flutter, multiple TIAs, and dementia, who presents for follow-up of PAF and mitral valve disease. She was previously followed in our office by Dr. Alvino Chapel, having last been seen in May. At that time, metoprolol was decreased given a resting heart rate near 60 beats per minute.  Today, the patient complains only of being cold. Most of the history is obtained from the patient's husband, as Ms. Chain has advanced dementia. He notes that she has been somewhat sleepy the last few days. She complains a lot about being cold as well as pain in her back and hips. She is not had any chest pain. She has chronic dyspnea, which is stable. She does not have any orthopnea or PND. She has not had syncope or falls. She ambulates with a walker. She continues to take warfarin with a target INR of 3-4. She has not had any significant bleeding.[name]  --------------------------------------------------------------------------------------------------  Past Medical History:  Diagnosis Date  . Anxiety   . Carotid stenosis    a. Carotid US (9/14):  Bilat 1-39% => f/u PRN  . Dementia   . Depression   . Gastroesophageal reflux disease   . History of echocardiogram    Echo 2/17: mild LVH, EF 55-60%, mild to mod AS (mean 14 mmHg), mechanical MVR ok (mean 7 mmHgg), mild LAE, PASP 58 mmHg  . HLD (hyperlipidemia)   . HTN (hypertension)   . Mitral regurgitation    a. s/p Bjork Shiley mechanical mitral valve replacement in 1980.  . 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 non-obstructive by cath 2007.  [name] PAF (paroxysmal atrial fibrillation) (HCC)    on  chronic coumadin  . Paroxysmal atrial flutter (HCC)    s/p RFA x 2  . TIA (transient ischemic attack)   . Tobacco abuse    Past Surgical History:  Procedure Laterality Date  . A FLUTTER ABLATION     x 2  . ESOPHAGOGASTRODUODENOSCOPY N/A 06/04/2015   Procedure: ESOPHAGOGASTRODUODENOSCOPY (EGD);  Surgeon: Iva Boop, MD;  Location: [facility] ENDOSCOPY;  Service: Endoscopy;  Laterality: N/A;  . HIP ARTHROPLASTY Left 05/07/2014   Procedure: ARTHROPLASTY MONOPOLAR HIP;  Surgeon: Eldred Manges, MD;  Location: [facility] OR;  Service: Orthopedics;  Laterality: Left;  . MITRAL VALVE REPLACEMENT     Bjork-Shiley valve placed for mitral regurgitation    Current Meds  Medication Sig  . ALPRAZolam (XANAX) 0.5 MG tablet TAKE 1/2 TO 1 TABLET BY MOUTH EVERY 6 HOURS AS NEEDED FOR ANXIETY  . Ascorbic Acid (VITAMIN C) 500 MG tablet Take 500 mg by mouth daily.    [name] aspirin EC 81 MG EC tablet Take 81 mg by mouth daily.    [name] atorvastatin (LIPITOR) 40 MG tablet TAKE 1 TABLET BY MOUTH ONCE A DAY  . buPROPion (WELLBUTRIN) 75 MG tablet TAKE 1 TABLET BY MOUTH EVERY MORNING  . Cyanocobalamin (VITAMIN B 12) 250 MCG LOZG Take 500 mg by mouth daily.    [name] diltiazem (CARDIZEM CD) 240 MG 24 hr capsule Take 1 capsule (240 mg total) by mouth daily.  . diphenhydramine-acetaminophen (TYLENOL PM EXTRA STRENGTH) 25-500 MG TABS tablet Take 1 tablet by mouth at  bedtime.   [name] escitalopram (LEXAPRO) 20 MG tablet TAKE 1 TABLET BY MOUTH ONCE A DAY  . ferrous sulfate 325 (65 FE) MG EC tablet Take 325 mg by mouth 3 (three) times daily with meals.  . haloperidol (HALDOL) 1 MG tablet TAKE 1/2 TABLET BY MOUTH ONCE OR TWICE ADAY AS NEEDED FOR ANXIETY  . lisinopril (PRINIVIL,ZESTRIL) 10 MG tablet TAKE 1 TABLET BY MOUTH ONCE DAILY  . Multiple Vitamins-Calcium (EQL ONE DAILY WOMENS PO) Take 1 tablet by mouth daily.    . pantoprazole (PROTONIX) 40 MG tablet TAKE 1 TABLET BY MOUTH DAILY BEFORE BREAKFAST  . temazepam (RESTORIL) 30 MG capsule TAKE 1  CAPSULE BY MOUTH EVERY NIGHT AT BEDTIME AS NEEDED FOR SLEEP  . traZODone (DESYREL) 100 MG tablet TAKE 1 TABLET BY MOUTH AT BEDTIME  . warfarin (COUMADIN) 5 MG tablet TAKE 1 TABLET BY MOUTH EVERY DAY EXCEPT ON TUESDAYS, THURSDAYS AND SUNDAYS TAKE 1/2 TABLET (Patient taking differently: TAKE 1 TABLET BY MOUTH EVERY DAY EXCEPT SUNDAYS TAKE 1/2 TABLET)    Allergies: Codeine; Aspirin; and Penicillins  Social History   Social History  . Marital status: Married    Spouse name: N/A  . Number of children: N/A  . Years of education: N/A   Occupational History  . Not on file.   Social History Main Topics  . Smoking status: Former Smoker    Packs/day: 1.00    Years: 35.00    Types: Cigarettes    Quit date: 08/25/2015  . Smokeless tobacco: Former Neurosurgeon  . Alcohol use No  . Drug use: No  . Sexual activity: Not on file   Other Topics Concern  . Not on file   Social History Narrative   Has one son who liver two doors down.     Family History  Problem Relation Age of Onset  . Heart attack Other   . Hypertension Neg Hx   . Stroke Neg Hx     Review of Systems: Unable to perform due to patient's advanced dementia.  --------------------------------------------------------------------------------------------------  Physical Exam: BP 107/70 (BP Location: Right Arm, Patient Position: Sitting, Cuff Size: Normal)   Pulse (!) 108   Ht  (1.676 m)   General:  Well-developed, well-nourished elderly woman seated comfortably in a wheelchair. HEENT: No conjunctival pallor or scleral icterus. Moist mucous membranes.  OP clear. Neck: Supple without lymphadenopathy, thyromegaly, JVD, or HJR. No carotid bruit. Lungs: Normal work of breathing. Mildly diminished breath sounds throughout without wheezes or crackles. Heart: Tachycardic but regular without murmurs or rubs. Abd: Bowel sounds present. Soft, NT/ND without hepatosplenomegaly Ext: No lower extremity edema. Radial, PT, and DP pulses are  2+ bilaterally. Skin: Warm and dry without rash.  EKG:  Atypical atrial flutter versus atrial tachycardia. Ventricular rate 10 8 bpm. Nonspecific ST segment changes.  Lab Results  Component Value Date   WBC 8.4 03/13/2016   HGB 14.1 03/13/2016   HCT 42.2 03/13/2016   MCV 84 03/13/2016   PLT 237 03/13/2016    Lab Results  Component Value Date   NA 137 06/14/2016   K 4.4 06/14/2016   CL 96 06/14/2016   CO2 25 06/14/2016   BUN 9 06/14/2016   CREATININE 0.72 06/14/2016   GLUCOSE 88 06/14/2016   ALT 13 06/14/2016    Lab Results  Component Value Date   CHOL 179 06/14/2016   HDL 61 06/14/2016   LDLCALC 93 06/14/2016   LDLDIRECT 226.0 04/08/2011   TRIG 124 06/14/2016  CHOLHDL 2.9 06/14/2016    --------------------------------------------------------------------------------------------------  ASSESSMENT AND PLAN: Atrial fibrillation/flutter EKG today is suggestive of atypical atrial flutter versus atrial tachycardia. It is difficult to elicit symptoms from the patient, though she appears comfortable at this time. At her last visit with Dr. Alvino Chapel in May, metoprolol was decreased. We have agreed to increase this back to 50 mg twice a day. I will continue her current dose of diltiazem (240 mg daily). She should remain on lifelong anticoagulation with warfarin. She will return in one week for reassessment of her heart rate. If she continues to be in this rhythm with suboptimal rate control, we may need to consider cardioversion with or without amiodarone loading.  History of mechanical mitral valve No evidence of valve dysfunction on exam or by history. Given history of multiple TIAs, her INR has been 3-4. We will continue with this INR goal as well as low-dose aspirin. I reminded the patient and her husband of the importance of prophylactic antibiotics for any dental procedures.  Nonobstructive coronary artery disease Chronic dyspnea is stable. No new symptoms that suggest  worsening coronary insufficiency. We will continue with medical management.  Follow-up: Return to clinic in 1 week.  Yvonne Kendall, MD 02/05/2017 1:41 PM

## 2017-02-07 ENCOUNTER — Encounter: Payer: Self-pay | Admitting: Internal Medicine

## 2017-02-16 NOTE — Progress Notes (Deleted)
Follow-up Outpatient Visit Date: 02/18/2017  Primary Care Provider: Malva Limes, MD [address] [place] [address]  Chief Complaint: ***  HPI:  [name] is a 76 y.o. year-old female with history of mitral valve disease status post mechanical MVR in 1980, nonobstructive coronary artery disease, paroxysmal atrial fibrillation and flutter, multiple TIAs, and dementia, who presents for follow-up of atrial flutter. I last saw her on 02/05/17, at which time she was found to be in atypical atrial flutter with a ventricular rate of 108 bpm. We increased metoprolol tartrate to 50 mg BID.  -------------------------------------------------------------------------------------------------- Past Medical History:  Diagnosis Date  . Anxiety   . Carotid stenosis    a. Carotid US (9/14):  Bilat 1-39% => f/u PRN  . Dementia   . Depression   . Gastroesophageal reflux disease   . History of echocardiogram    Echo 2/17: mild LVH, EF 55-60%, mild to mod AS (mean 14 mmHg), mechanical MVR ok (mean 7 mmHgg), mild LAE, PASP 58 mmHg  . HLD (hyperlipidemia)   . HTN (hypertension)   . Mitral regurgitation    a. s/p Bjork Shiley mechanical mitral valve replacement in 1980.  . 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 non-obstructive by cath 2007.  [name] PAF (paroxysmal atrial fibrillation) (HCC)    on chronic coumadin  . Paroxysmal atrial flutter (HCC)    s/p RFA x 2  . TIA (transient ischemic attack)   . Tobacco abuse    Past Surgical History:  Procedure Laterality Date  . A FLUTTER ABLATION     x 2  . ESOPHAGOGASTRODUODENOSCOPY N/A 06/04/2015   Procedure: ESOPHAGOGASTRODUODENOSCOPY (EGD);  Surgeon: Iva Boop, MD;  Location: [facility] ENDOSCOPY;  Service: Endoscopy;  Laterality: N/A;  . HIP ARTHROPLASTY Left 05/07/2014   Procedure: ARTHROPLASTY MONOPOLAR HIP;  Surgeon: Eldred Manges, MD;  Location: [facility] OR;  Service: Orthopedics;  Laterality: Left;  . MITRAL VALVE REPLACEMENT      Bjork-Shiley valve placed for mitral regurgitation    No outpatient prescriptions have been marked as taking for the 02/18/17 encounter (Appointment) with Bert Givans, Cristal Deer, MD.    Allergies: Codeine; Aspirin; and Penicillins  Social History   Social History  . Marital status: Married    Spouse name: N/A  . Number of children: N/A  . Years of education: N/A   Occupational History  . Not on file.   Social History Main Topics  . Smoking status: Former Smoker    Packs/day: 0.25    Years: 35.00    Types: Cigarettes    Quit date: 08/25/2015  . Smokeless tobacco: Former Neurosurgeon  . Alcohol use No  . Drug use: No  . Sexual activity: Not on file   Other Topics Concern  . Not on file   Social History Narrative   Has one son who liver two doors down.     Family History  Problem Relation Age of Onset  . Heart attack Other   . Hypertension Neg Hx   . Stroke Neg Hx     Review of Systems: A 12-system review of systems was performed and was negative except as noted in the HPI.  --------------------------------------------------------------------------------------------------  Physical Exam: There were no vitals taken for this visit.  General:  *** HEENT: No conjunctival pallor or scleral icterus. Moist mucous membranes.  OP clear. Neck: Supple without lymphadenopathy, thyromegaly, JVD, or HJR. No carotid bruit. Lungs: Normal work of breathing. Clear to auscultation bilaterally without wheezes or crackles.  Heart: Regular rate and rhythm without murmurs, rubs, or gallops. Non-displaced PMI. Abd: Bowel sounds present. Soft, NT/ND without hepatosplenomegaly Ext: No lower extremity edema. Radial, PT, and DP pulses are 2+ bilaterally. Skin: Warm and dry without rash.  EKG:  ***  Lab Results  Component Value Date   WBC 8.4 03/13/2016   HGB 14.1 03/13/2016   HCT 42.2 03/13/2016   MCV 84 03/13/2016   PLT 237 03/13/2016    Lab Results  Component Value Date   NA 137  06/14/2016   K 4.4 06/14/2016   CL 96 06/14/2016   CO2 25 06/14/2016   BUN 9 06/14/2016   CREATININE 0.72 06/14/2016   GLUCOSE 88 06/14/2016   ALT 13 06/14/2016    Lab Results  Component Value Date   CHOL 179 06/14/2016   HDL 61 06/14/2016   LDLCALC 93 06/14/2016   LDLDIRECT 226.0 04/08/2011   TRIG 124 06/14/2016   CHOLHDL 2.9 06/14/2016    --------------------------------------------------------------------------------------------------  ASSESSMENT AND PLAN: Cristal Deer Camden Mazzaferro, MD 02/16/2017 9:46 PM

## 2017-02-18 ENCOUNTER — Other Ambulatory Visit
Admission: RE | Admit: 2017-02-18 | Discharge: 2017-02-18 | Disposition: A | Discharge status: Home / Self Care | Payer: Medicare PPO | Source: Ambulatory Visit | Attending: Nurse Practitioner | Admitting: Nurse Practitioner

## 2017-02-18 ENCOUNTER — Ambulatory Visit: Payer: Medicare PPO | Admitting: Nurse Practitioner

## 2017-02-18 ENCOUNTER — Encounter: Payer: Self-pay | Admitting: Nurse Practitioner

## 2017-02-18 ENCOUNTER — Ambulatory Visit: Payer: Medicare PPO | Admitting: Internal Medicine

## 2017-02-18 ENCOUNTER — Ambulatory Visit (INDEPENDENT_AMBULATORY_CARE_PROVIDER_SITE_OTHER): Payer: Medicare PPO | Admitting: Nurse Practitioner

## 2017-02-18 VITALS — BP 100/60 | HR 111 | Ht 66.0 in | Wt 170.5 lb

## 2017-02-18 DIAGNOSIS — I484 Atypical atrial flutter: Secondary | ICD-10-CM | POA: Diagnosis not present

## 2017-02-18 DIAGNOSIS — I480 Paroxysmal atrial fibrillation: Secondary | ICD-10-CM

## 2017-02-18 DIAGNOSIS — T8203XD Leakage of heart valve prosthesis, subsequent encounter: Secondary | ICD-10-CM

## 2017-02-18 LAB — COMPREHENSIVE METABOLIC PANEL
ALT: 16 U/L (ref 14–54)
ANION GAP: 9 (ref 5–15)
AST: 22 U/L (ref 15–41)
Albumin: 3.5 g/dL (ref 3.5–5.0)
Alkaline Phosphatase: 116 U/L (ref 38–126)
BILIRUBIN TOTAL: 1 mg/dL (ref 0.3–1.2)
BUN: 15 mg/dL (ref 6–20)
CHLORIDE: 101 mmol/L (ref 101–111)
CO2: 28 mmol/L (ref 22–32)
Calcium: 9.6 mg/dL (ref 8.9–10.3)
Creatinine, Ser: 0.74 mg/dL (ref 0.44–1.00)
Glucose, Bld: 160 mg/dL — ABNORMAL HIGH (ref 65–99)
POTASSIUM: 4.1 mmol/L (ref 3.5–5.1)
Sodium: 138 mmol/L (ref 135–145)
TOTAL PROTEIN: 7.3 g/dL (ref 6.5–8.1)

## 2017-02-18 LAB — CBC
HCT: 40.7 % (ref 35.0–47.0)
HEMOGLOBIN: 14.1 g/dL (ref 12.0–16.0)
MCH: 30.6 pg (ref 26.0–34.0)
MCHC: 34.6 g/dL (ref 32.0–36.0)
MCV: 88.5 fL (ref 80.0–100.0)
PLATELETS: 212 10*3/uL (ref 150–440)
RBC: 4.59 MIL/uL (ref 3.80–5.20)
RDW: 13.8 % (ref 11.5–14.5)
WBC: 8.5 10*3/uL (ref 3.6–11.0)

## 2017-02-18 LAB — TSH: TSH: 2.593 u[IU]/mL (ref 0.350–4.500)

## 2017-02-18 MED ORDER — AMIODARONE HCL 200 MG PO TABS: ORAL_TABLET | ORAL | 0 refills | Status: DC

## 2017-02-18 NOTE — Progress Notes (Signed)
Office Visit    Patient Name: [name] [name] Date of Encounter: 02/18/2017  Primary Care Provider:  Malva Limes, MD Primary Cardiologist:  Wallace Cullens, MD   Chief Complaint    76 year old female with a history of mitral valve disease status post mechanical mitral valve replacement in 1980, nonobstructive CAD, paroxysmal atrial fibrillation, paroxysmal atrial flutter status post prior catheter ablation, multiple TIAs, and dementia, who presents for follow-up related to ongoing atypical atrial flutter.  Past Medical History    Past Medical History:  Diagnosis Date  . Anxiety   . Carotid stenosis    a. Carotid US (9/14):  Bilat 1-39% => f/u PRN  . Dementia   . Depression   . Gastroesophageal reflux disease   . History of echocardiogram    Echo 2/17: mild LVH, EF 55-60%, mild to mod AS (mean 14 mmHg), mechanical MVR ok (mean 7 mmHgg), mild LAE, PASP 58 mmHg  . HLD (hyperlipidemia)   . HTN (hypertension)   . Mitral regurgitation    a. s/p Bjork Shiley mechanical mitral valve replacement in 1980; b. 08/2016 Echo: EF 60-65%, no rwma, mild AS, mod dil LA, no signif MR, PASP .  . 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 non-obstructive by cath 2007.  [name] PAF (paroxysmal atrial fibrillation) (HCC)    on chronic coumadin  . Paroxysmal atrial flutter (HCC)    s/p RFA x 2  . TIA (transient ischemic attack)   . Tobacco abuse    Past Surgical History:  Procedure Laterality Date  . A FLUTTER ABLATION     x 2  . ESOPHAGOGASTRODUODENOSCOPY N/A 06/04/2015   Procedure: ESOPHAGOGASTRODUODENOSCOPY (EGD);  Surgeon: Iva Boop, MD;  Location: [facility] ENDOSCOPY;  Service: Endoscopy;  Laterality: N/A;  . HIP ARTHROPLASTY Left 05/07/2014   Procedure: ARTHROPLASTY MONOPOLAR HIP;  Surgeon: Eldred Manges, MD;  Location: [facility] OR;  Service: Orthopedics;  Laterality: Left;  . MITRAL VALVE REPLACEMENT     Bjork-Shiley valve placed for mitral regurgitation    Allergies  Allergies    Allergen Reactions  . Codeine Other (See Comments)  . Aspirin     unknown  . Penicillins     Has patient had a PCN reaction causing immediate rash, facial/tongue/throat swelling, SOB or lightheadedness with hypotension: NO Has patient had a PCN reaction causing severe rash involving mucus membranes or skin necrosis: NO Has patient had a PCN reaction that required hospitalization NO Has patient had a PCN reaction occurring within the last 10 years: NO If all of the above answers are "NO", then may proceed with Cephalosporin use.    History of Present Illness    76 year old female with a history of mitral valve disease status post mechanical mitral valve replacement in 1980, nonobstructive CAD, paroxysmal atrial fibrillation, paroxysmal atrial flutter status post prior catheter ablation, multiple TIAs, and dementia. She was recently seen in clinic on September 26 and was noted to be in atrial flutter with 2-1 conduction. Her only complaint was that of feeling cold along with some pain in her back and hips. She was not having chest pain, dyspnea, or palpitations. Beta blocker dose was increased and arrangements were made for follow-up today. INR was therapeutic on the 26th. Patient says she has been feeling fine, though dementia limits her ability to provide history. Husband says that she hasn't had any recent complaints related to her heart. Every day she complains of diffuse body aches which are not new complaints for her.  She remains in atrial flutter today rate of 111. She is asymptomatic.  Home Medications    Prior to Admission medications   Medication Sig Start Date End Date Taking? Authorizing Provider  ALPRAZolam (XANAX) 0.5 MG tablet TAKE 1/2 TO 1 TABLET BY MOUTH EVERY 6 HOURS AS NEEDED FOR ANXIETY 10/04/16  Yes Malva Limes, MD  Ascorbic Acid (VITAMIN C) 500 MG tablet Take 500 mg by mouth daily.     Yes [provider]  aspirin EC 81 MG EC tablet Take 81 mg by mouth daily.      Yes [provider]  atorvastatin (LIPITOR) 40 MG tablet TAKE 1 TABLET BY MOUTH ONCE A DAY 07/29/16  Yes Malva Limes, MD  buPROPion [facility]) 75 MG tablet TAKE 1 TABLET BY MOUTH EVERY MORNING 04/15/16  Yes Malva Limes, MD  Cyanocobalamin (VITAMIN B 12) 250 MCG LOZG Take 500 mg by mouth daily.     Yes [provider]  diltiazem (CARDIZEM CD) 240 MG 24 hr capsule Take 1 capsule (240 mg total) by mouth daily. 06/11/16  Yes Weaver, Scott T, PA-C  diphenhydramine-acetaminophen (TYLENOL PM EXTRA STRENGTH) 25-500 MG TABS tablet Take 1 tablet by mouth at bedtime.  01/07/11  Yes [provider]  escitalopram (LEXAPRO) 20 MG tablet TAKE 1 TABLET BY MOUTH ONCE A DAY 12/25/15  Yes Malva Limes, MD  ferrous sulfate 325 (65 FE) MG EC tablet Take 325 mg by mouth 3 (three) times daily with meals.   Yes [provider]  haloperidol (HALDOL) 1 MG tablet TAKE 1/2 TABLET BY MOUTH ONCE OR TWICE ADAY AS NEEDED FOR ANXIETY 07/29/16  Yes Fisher, Demetrios Isaacs, MD  lisinopril (PRINIVIL,ZESTRIL) 10 MG tablet TAKE 1 TABLET BY MOUTH ONCE DAILY 01/30/17  Yes End, Cristal Deer, MD  metoprolol tartrate (LOPRESSOR) 50 MG tablet Take 1 tablet (50 mg total) by mouth 2 (two) times daily. 02/05/17 05/06/17 Yes End, Cristal Deer, MD  Multiple Vitamins-Calcium (EQL ONE DAILY WOMENS PO) Take 1 tablet by mouth daily.     Yes [provider]  pantoprazole (PROTONIX) 40 MG tablet TAKE 1 TABLET BY MOUTH DAILY BEFORE BREAKFAST 04/08/16  Yes Malva Limes, MD  temazepam (RESTORIL) 30 MG capsule TAKE 1 CAPSULE BY MOUTH EVERY NIGHT AT BEDTIME AS NEEDED FOR SLEEP 01/30/17  Yes Malva Limes, MD  traZODone (DESYREL) 100 MG tablet TAKE 1 TABLET BY MOUTH AT BEDTIME 06/14/16  Yes Malva Limes, MD  warfarin (COUMADIN) 5 MG tablet TAKE 1 TABLET BY MOUTH EVERY DAY EXCEPT ON TUESDAYS, THURSDAYS AND SUNDAYS TAKE 1/2 TABLET Patient taking differently: TAKE 1 TABLET BY MOUTH EVERY DAY EXCEPT SUNDAYS  TAKE 1/2 TABLET 12/31/16  Yes Malva Limes, MD  amiodarone (PACERONE) 200 MG tablet Take  (2 tablets) twice daily for 7 days then take  (1 tablet) twice daily 02/18/17   Ok Anis, NP    Review of Systems    Chronic, diffuse body aches. No chest pain, palpitations, dyspnea, PND, orthopnea, dizziness, syncope, edema, or early satiety..  All other systems reviewed and are otherwise negative except as noted above.  Physical Exam    VS:  BP 100/60 (BP Location: Left Arm, Patient Position: Sitting, Cuff Size: Normal)   Pulse (!) 111   Ht  (1.676 m)   Wt 170 lb 8 oz (77.3 kg)   BMI 27.52 kg/m  , BMI Body mass index is 27.52 kg/m. GEN: Well nourished, well developed, in no acute distress.  HEENT: normal.  Neck: Supple, no JVD, carotid bruits, or masses. Cardiac: RRR,Tachycardic, mechanical S1, no murmurs, rubs, or gallops. No clubbing, cyanosis, edema.  Radials/DP/PT 2+ and equal bilaterally.  Respiratory:  Respirations regular and unlabored, clear to auscultation bilaterally. GI: Soft, nontender, nondistended, BS + x 4. MS: no deformity or atrophy. Skin: warm and dry, no rash. Neuro:  Strength and sensation are intact. Psych: Normal affect. Disoriented to time.  Accessory Clinical Findings    ECG - Atrial flutter with 2-1 conduction, nonspecific ST and T changes.  Assessment & Plan    1.  Atypical atrial flutter: Patient noted to be tachycardic September 26 with diagnosis of atypical atrial flutter. She is prior history of atrial arrhythmias status post ablation 2. Beta blocker dose was increased 2 weeks ago however rate is unchanged. Patient is asymptomatic. We discussed options for management with patient and husband today. We will add amiodarone 400 mg twice a day 1 week with a plan to drop it down to 200 mg twice a day at that point. Plan to follow-up in clinic in 1-2 weeks to readdress rhythm and potentially schedule cardioversion. Patient and husband  were offered cardioversion up front however we all agreed that trying medical therapy first was most appropriate. She remains on Coumadin in the setting of mechanical mitral valve and she was therapeutic at her last visit.  2. Mitral valve disease status post mechanical mitral valve: On chronic Coumadin with INR goal of 3-4. She does require prophylactic antibodies for any dental procedures.  3. Nonobstructive CAD: No new symptoms. Continue medical therapy.  4. Disposition: Follow-up in clinic in 1-2 weeks to evaluate rhythm and consider scheduling cardioversion.   Nicolasa Ducking, NP 02/18/2017, 5:27 PM

## 2017-02-18 NOTE — Patient Instructions (Signed)
Medication Instructions:  Your physician has recommended you make the following change in your medication:  START taking amiodarone  (2 tablets) twice a day for 7 days then reduce to  (1 tablet) twice daily   Labwork: CMET, TSH, [facility]  Testing/Procedures: none  Follow-Up: Your physician recommends that you schedule a follow-up appointment in: 2 weeks with Ward Givens, NP, or Dr. Okey Dupre.    Any Other Special Instructions Will Be Listed Below (If Applicable).     If you need a refill on your cardiac medications before your next appointment, please call your pharmacy.

## 2017-02-19 NOTE — Addendum Note (Signed)
Addended by: Margrett Rud on: 02/19/2017 10:43 AM   Modules accepted: Orders

## 2017-02-26 ENCOUNTER — Other Ambulatory Visit
Admission: RE | Admit: 2017-02-26 | Discharge: 2017-02-26 | Disposition: A | Discharge status: Home / Self Care | Payer: Medicare PPO | Source: Ambulatory Visit | Attending: Internal Medicine | Admitting: Internal Medicine

## 2017-02-26 ENCOUNTER — Ambulatory Visit (INDEPENDENT_AMBULATORY_CARE_PROVIDER_SITE_OTHER): Payer: Medicare PPO

## 2017-02-26 DIAGNOSIS — Z7901 Long term (current) use of anticoagulants: Secondary | ICD-10-CM | POA: Insufficient documentation

## 2017-02-26 DIAGNOSIS — Z5181 Encounter for therapeutic drug level monitoring: Secondary | ICD-10-CM | POA: Diagnosis not present

## 2017-02-26 DIAGNOSIS — R791 Abnormal coagulation profile: Secondary | ICD-10-CM

## 2017-02-26 DIAGNOSIS — G459 Transient cerebral ischemic attack, unspecified: Secondary | ICD-10-CM | POA: Diagnosis not present

## 2017-02-26 DIAGNOSIS — I4891 Unspecified atrial fibrillation: Secondary | ICD-10-CM

## 2017-02-26 DIAGNOSIS — I480 Paroxysmal atrial fibrillation: Secondary | ICD-10-CM

## 2017-02-26 DIAGNOSIS — Z9889 Other specified postprocedural states: Secondary | ICD-10-CM | POA: Diagnosis not present

## 2017-02-26 LAB — PROTIME-INR
INR: 4.36 — AB
Prothrombin Time: 41.4 seconds — ABNORMAL HIGH (ref 11.4–15.2)

## 2017-02-26 LAB — POCT INR

## 2017-02-28 ENCOUNTER — Other Ambulatory Visit: Payer: Self-pay | Admitting: Internal Medicine

## 2017-02-28 ENCOUNTER — Other Ambulatory Visit: Payer: Self-pay | Admitting: Family Medicine

## 2017-02-28 DIAGNOSIS — F329 Major depressive disorder, single episode, unspecified: Secondary | ICD-10-CM

## 2017-02-28 DIAGNOSIS — F32A Depression, unspecified: Secondary | ICD-10-CM

## 2017-03-06 ENCOUNTER — Other Ambulatory Visit: Payer: Self-pay | Admitting: Nurse Practitioner

## 2017-03-06 DIAGNOSIS — I484 Atypical atrial flutter: Secondary | ICD-10-CM

## 2017-03-07 ENCOUNTER — Encounter: Payer: Self-pay | Admitting: Nurse Practitioner

## 2017-03-07 ENCOUNTER — Ambulatory Visit (INDEPENDENT_AMBULATORY_CARE_PROVIDER_SITE_OTHER): Payer: Medicare PPO | Admitting: Nurse Practitioner

## 2017-03-07 VITALS — BP 112/54 | HR 55 | Ht 66.0 in | Wt 171.5 lb

## 2017-03-07 DIAGNOSIS — I484 Atypical atrial flutter: Secondary | ICD-10-CM | POA: Diagnosis not present

## 2017-03-07 DIAGNOSIS — I480 Paroxysmal atrial fibrillation: Secondary | ICD-10-CM | POA: Diagnosis not present

## 2017-03-07 DIAGNOSIS — Z952 Presence of prosthetic heart valve: Secondary | ICD-10-CM | POA: Diagnosis not present

## 2017-03-07 MED ORDER — AMIODARONE HCL 200 MG PO TABS: 200.0000 mg | ORAL_TABLET | Freq: Every day | ORAL | 3 refills | Status: AC

## 2017-03-07 NOTE — Progress Notes (Signed)
Office Visit    Patient Name: [name] SangerCecilia R Harron Date of Encounter: 03/07/2017  Primary Care Provider:  Malva LimesFisher, Donald E, MD Primary Cardiologist:  Wallace Cullens. End, MD   Chief Complaint    76 year old ? with a history of mitral valve disease status post mechanical mitral valve replacement in 1980 on chronic Coumadin, nonobstructive CAD, paroxysmal atrial fibrillation and flutter status post prior catheter ablation, multiple TIAs, dementia, and recent recurrence of atypical atrial flutter who presents for follow-up.  Past Medical History    Past Medical History:  Diagnosis Date  . Anxiety   . Carotid stenosis    a. Carotid US (9/14):  Bilat 1-39% => f/u PRN  . Dementia   . Depression   . Gastroesophageal reflux disease   . History of echocardiogram    Echo 2/17: mild LVH, EF 55-60%, mild to mod AS (mean 14 mmHg), mechanical MVR ok (mean 7 mmHgg), mild LAE, PASP 58 mmHg  . HLD (hyperlipidemia)   . HTN (hypertension)   . Mitral regurgitation    a. s/p Bjork Shiley mechanical mitral valve replacement in 1980; b. 08/2016 Echo: EF 60-65%, no rwma, mild AS, mod dil LA, no signif MR, PASP 60mmHg.  . 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 non-obstructive by cath 2007.  [name]. PAF (paroxysmal atrial fibrillation) (HCC)    on chronic coumadin  . Paroxysmal atrial flutter (HCC)    s/p RFA x 2  . TIA (transient ischemic attack)   . Tobacco abuse    Past Surgical History:  Procedure Laterality Date  . A FLUTTER ABLATION     x 2  . ESOPHAGOGASTRODUODENOSCOPY N/A 06/04/2015   Procedure: ESOPHAGOGASTRODUODENOSCOPY (EGD);  Surgeon: Iva Booparl E Gessner, MD;  [facility];  Service: Endoscopy;  Laterality: N/A;  . HIP ARTHROPLASTY Left 05/07/2014   Procedure: ARTHROPLASTY MONOPOLAR HIP;  Surgeon: Eldred MangesMark C Yates, MD;  Location: [facility] OR;  Service: Orthopedics;  Laterality: Left;  . MITRAL VALVE REPLACEMENT     Bjork-Shiley valve placed for mitral regurgitation    Allergies  Allergies    Allergen Reactions  . Codeine Other (See Comments)  . Aspirin     unknown  . Penicillins     Has patient had a PCN reaction causing immediate rash, facial/tongue/throat swelling, SOB or lightheadedness with hypotension: NO Has patient had a PCN reaction causing severe rash involving mucus membranes or skin necrosis: NO Has patient had a PCN reaction that required hospitalization NO Has patient had a PCN reaction occurring within the last 10 years: NO If all of the above answers are "NO", then may proceed with Cephalosporin use.    History of Present Illness    76 year old female with the above complex past medical history including mitral valve disease status post mechanical mitral valve replacement in 1980.  She is on chronic Coumadin.  Other history includes nonobstructive CAD, paroxysmal atrial fibrillation, paroxysmal atrial flutter status post catheter ablation, multiple TIAs, and dementia.  She was recently seen in clinic on September 26 and noted to be in atrial flutter with 2-1 conduction.  She was asymptomatic.  Beta-blocker dose was increased and she subsequently followed up on October 9 with ongoing asymptomatic atypical atrial flutter at a rate of 111.  She was placed on amiodarone 400 mg twice daily and after 1 week, this was weaned down to 200 mg twice daily.  She is with her husband today.  She reports feeling tired some of the time but there are no other  complaints.  Her heart rate today is 56 and she is in afib.  Home Medications    Prior to Admission medications   Medication Sig Start Date End Date Taking? Authorizing Provider  ALPRAZolam (XANAX) 0.5 MG tablet TAKE 1/2 TO 1 TABLET BY MOUTH EVERY 6 HOURS AS NEEDED FOR ANXIETY 10/04/16  Yes Malva Limes, MD  amiodarone (PACERONE) 200 MG tablet TAKE 2 TABLETS (400 MG) BY MOUTH TWICE DAILY FOR 7 DAYS, THEN TAKE 1 TABLET TWICE DAILY 03/07/17  Yes Ok Anis, NP  Ascorbic Acid (VITAMIN C) 500 MG tablet Take 500 mg by  mouth daily.     Yes [provider]  aspirin EC 81 MG EC tablet Take 81 mg by mouth daily.     Yes [provider]  atorvastatin (LIPITOR) 40 MG tablet TAKE 1 TABLET BY MOUTH ONCE A DAY 07/29/16  Yes Malva Limes, MD  buPROPion [facility]) 75 MG tablet TAKE 1 TABLET BY MOUTH EVERY MORNING 04/15/16  Yes Malva Limes, MD  Cyanocobalamin (VITAMIN B 12) 250 MCG LOZG Take 500 mg by mouth daily.     Yes [provider]  diltiazem (CARDIZEM CD) 240 MG 24 hr capsule Take 1 capsule (240 mg total) by mouth daily. 06/11/16  Yes Weaver, Scott T, PA-C  diphenhydramine-acetaminophen (TYLENOL PM EXTRA STRENGTH) 25-500 MG TABS tablet Take 1 tablet by mouth at bedtime.  01/07/11  Yes [provider]  escitalopram (LEXAPRO) 20 MG tablet TAKE 1 TABLET BY MOUTH ONCE DAILY 02/28/17  Yes Malva Limes, MD  ferrous sulfate 325 (65 FE) MG EC tablet Take 325 mg by mouth 3 (three) times daily with meals.   Yes [provider]  haloperidol (HALDOL) 1 MG tablet TAKE 1/2 TABLET BY MOUTH ONCE OR TWICE ADAY AS NEEDED FOR ANXIETY 07/29/16  Yes Fisher, Demetrios Isaacs, MD  lisinopril (PRINIVIL,ZESTRIL) 10 MG tablet TAKE 1 TABLET BY MOUTH ONCE DAILY 02/28/17  Yes End, Cristal Deer, MD  metoprolol tartrate (LOPRESSOR) 50 MG tablet Take 1 tablet (50 mg total) by mouth 2 (two) times daily. 02/05/17 05/06/17 Yes End, Cristal Deer, MD  Multiple Vitamins-Calcium (EQL ONE DAILY WOMENS PO) Take 1 tablet by mouth daily.     Yes [provider]  pantoprazole (PROTONIX) 40 MG tablet TAKE 1 TABLET BY MOUTH DAILY BEFORE BREAKFAST 04/08/16  Yes Malva Limes, MD  temazepam (RESTORIL) 30 MG capsule TAKE 1 CAPSULE BY MOUTH EVERY NIGHT AT BEDTIME AS NEEDED FOR SLEEP 01/30/17  Yes Malva Limes, MD  traZODone (DESYREL) 100 MG tablet TAKE 1 TABLET BY MOUTH AT BEDTIME 06/14/16  Yes Malva Limes, MD  warfarin (COUMADIN) 5 MG tablet TAKE 1 TABLET BY MOUTH EVERY DAY EXCEPT ON TUESDAYS, THURSDAYS  AND SUNDAYS TAKE 1/2 TABLET Patient taking differently: TAKE 1 TABLET BY MOUTH EVERY DAY EXCEPT SUNDAYS TAKE 1/2 TABLET 12/31/16  Yes Malva Limes, MD    Review of Systems    Tired a lot - naps often.  She denies chest pain, palpitations, dyspnea, pnd, orthopnea, n, v, dizziness, syncope, edema, weight gain, or early satiety.  All other systems reviewed and are otherwise negative except as noted above.  Physical Exam    VS:  BP (!) 112/54 (BP Location: Left Arm, Patient Position: Sitting, Cuff Size: Normal)   Pulse (!) 55   Ht 5\' 6"  (1.676 m)   Wt 171 lb 8 oz (77.8 kg)   BMI 27.68 kg/m  , BMI Body mass index is 27.68  kg/m. GEN: Well nourished, well developed, in no acute distress.  HEENT: normal.  Neck: Supple, no JVD, carotid bruits, or masses. Cardiac: mostly regular, mechanical S1 with a 2/6 systolic murmur heard throughout but loudest at the upper sternal borders, no rubs, or gallops. No clubbing, cyanosis, edema.  Radials/DP/PT 2+ and equal bilaterally.  Respiratory:  Respirations regular and unlabored, clear to auscultation bilaterally. GI: Soft, nontender, nondistended, BS + x 4. MS: no deformity or atrophy. Skin: warm and dry, no rash. Neuro:  Strength and sensation are intact. Psych: Normal affect.  Accessory Clinical Findings    ECG -multiple ECGs performed.  Initially she had a very wavy baseline.  12-lead rhythm strip shows a more irregular rhythm without consistent P waves, suggestive of atrial fibrillation, 48, nonspecific T changes.  Lab Results  Component Value Date   ALT 16 02/18/2017   AST 22 02/18/2017   ALKPHOS 116 02/18/2017   BILITOT 1.0 02/18/2017   Lab Results  Component Value Date   TSH 2.593 02/18/2017   Lab Results  Component Value Date   CREATININE 0.74 02/18/2017   BUN 15 02/18/2017   NA 138 02/18/2017   K 4.1 02/18/2017   CL 101 02/18/2017   CO2 28 02/18/2017    Assessment & Plan    1.  Atypical atrial flutter/PAF: Noted to be  tachycardic September 26 with diagnosis of atypical atrial flutter.  Amiodarone initiated October 9.  So far, she has tolerated this well.  Today, she is in rate controlled A. fib.  She is actually bradycardic.  Plan to reduce amiodarone to 200 mg daily.  Continue beta-blocker and diltiazem.  She is anticoagulated with Coumadin in the setting of mechanical mitral valve and will continue to be followed in our Coumadin clinic.  She has required dose adjustment since initiation of amiodarone and has early follow-up on October 31.  We did discuss the role of cardioversion however given adequate rate control and relative lack of symptoms, patient and husband prefer continued conservative therapy, which is appropriate.  2.  Mitral valve disease status post mechanical mitral valve replacement: On chronic Coumadin with INR goal of 3-4.  INR higher on amiodarone.  Has Coumadin clinic follow-up on the 31st.  3.  Nonobstructive CAD: No new symptoms.  Continue medical therapy.  4.  Disposition: Follow-up in clinic in 6-8 weeks or sooner if necessary.  Nicolasa Ducking, NP 03/07/2017, 2:32 PM

## 2017-03-07 NOTE — Patient Instructions (Signed)
Medication Instructions:  Your physician has recommended you make the following change in your medication:  1- DECREASE Amiodarone to 200 mg (1 tablet) by mouth once a day.   Labwork: none  Testing/Procedures: none  Follow-Up: Your physician recommends that you schedule a follow-up appointment in: 6-8 WEEKS WITH DR END.  If you need a refill on your cardiac medications before your next appointment, please call your pharmacy.

## 2017-03-12 ENCOUNTER — Other Ambulatory Visit
Admission: RE | Admit: 2017-03-12 | Discharge: 2017-03-12 | Disposition: A | Discharge status: Home / Self Care | Payer: Medicare PPO | Source: Ambulatory Visit | Attending: Cardiovascular Disease | Admitting: Cardiovascular Disease

## 2017-03-12 ENCOUNTER — Ambulatory Visit (INDEPENDENT_AMBULATORY_CARE_PROVIDER_SITE_OTHER): Payer: Medicare PPO

## 2017-03-12 DIAGNOSIS — I480 Paroxysmal atrial fibrillation: Secondary | ICD-10-CM | POA: Diagnosis present

## 2017-03-12 DIAGNOSIS — Z5181 Encounter for therapeutic drug level monitoring: Secondary | ICD-10-CM | POA: Insufficient documentation

## 2017-03-12 DIAGNOSIS — R791 Abnormal coagulation profile: Secondary | ICD-10-CM | POA: Diagnosis present

## 2017-03-12 DIAGNOSIS — Z7901 Long term (current) use of anticoagulants: Secondary | ICD-10-CM | POA: Diagnosis not present

## 2017-03-12 DIAGNOSIS — G459 Transient cerebral ischemic attack, unspecified: Secondary | ICD-10-CM | POA: Insufficient documentation

## 2017-03-12 DIAGNOSIS — I4891 Unspecified atrial fibrillation: Secondary | ICD-10-CM | POA: Insufficient documentation

## 2017-03-12 DIAGNOSIS — Z9889 Other specified postprocedural states: Secondary | ICD-10-CM

## 2017-03-12 LAB — PROTIME-INR
INR: 4.96 — AB
PROTHROMBIN TIME: 45.8 s — AB (ref 11.4–15.2)

## 2017-03-12 LAB — POCT INR: INR: 4.96

## 2017-03-26 ENCOUNTER — Ambulatory Visit (INDEPENDENT_AMBULATORY_CARE_PROVIDER_SITE_OTHER): Payer: Medicare PPO

## 2017-03-26 DIAGNOSIS — Z5181 Encounter for therapeutic drug level monitoring: Secondary | ICD-10-CM

## 2017-03-26 DIAGNOSIS — G459 Transient cerebral ischemic attack, unspecified: Secondary | ICD-10-CM

## 2017-03-26 DIAGNOSIS — Z9889 Other specified postprocedural states: Secondary | ICD-10-CM | POA: Diagnosis not present

## 2017-03-26 DIAGNOSIS — Z7901 Long term (current) use of anticoagulants: Secondary | ICD-10-CM

## 2017-03-26 DIAGNOSIS — I4891 Unspecified atrial fibrillation: Secondary | ICD-10-CM | POA: Diagnosis not present

## 2017-03-26 DIAGNOSIS — I480 Paroxysmal atrial fibrillation: Secondary | ICD-10-CM | POA: Diagnosis not present

## 2017-03-26 LAB — POCT INR: INR: 5.7

## 2017-04-09 ENCOUNTER — Ambulatory Visit (INDEPENDENT_AMBULATORY_CARE_PROVIDER_SITE_OTHER): Payer: Medicare PPO

## 2017-04-09 DIAGNOSIS — I4891 Unspecified atrial fibrillation: Secondary | ICD-10-CM | POA: Diagnosis not present

## 2017-04-09 DIAGNOSIS — Z5181 Encounter for therapeutic drug level monitoring: Secondary | ICD-10-CM

## 2017-04-09 DIAGNOSIS — Z7901 Long term (current) use of anticoagulants: Secondary | ICD-10-CM | POA: Diagnosis not present

## 2017-04-09 DIAGNOSIS — Z9889 Other specified postprocedural states: Secondary | ICD-10-CM | POA: Diagnosis not present

## 2017-04-09 DIAGNOSIS — G459 Transient cerebral ischemic attack, unspecified: Secondary | ICD-10-CM | POA: Diagnosis not present

## 2017-04-09 DIAGNOSIS — I480 Paroxysmal atrial fibrillation: Secondary | ICD-10-CM | POA: Diagnosis not present

## 2017-04-09 LAB — POCT INR: INR: 5.6

## 2017-04-09 NOTE — Patient Instructions (Signed)
Please skip coumadin today and tomorrow, then start new dosage of 1/2 pill every day except 1 pill on Sundays, Tuesdays and Thursdays Recheck INR in 2 weeks.

## 2017-04-23 ENCOUNTER — Ambulatory Visit (INDEPENDENT_AMBULATORY_CARE_PROVIDER_SITE_OTHER): Payer: Medicare PPO

## 2017-04-23 DIAGNOSIS — I480 Paroxysmal atrial fibrillation: Secondary | ICD-10-CM | POA: Diagnosis not present

## 2017-04-23 DIAGNOSIS — Z7901 Long term (current) use of anticoagulants: Secondary | ICD-10-CM

## 2017-04-23 DIAGNOSIS — G459 Transient cerebral ischemic attack, unspecified: Secondary | ICD-10-CM | POA: Diagnosis not present

## 2017-04-23 DIAGNOSIS — I4891 Unspecified atrial fibrillation: Secondary | ICD-10-CM

## 2017-04-23 DIAGNOSIS — Z5181 Encounter for therapeutic drug level monitoring: Secondary | ICD-10-CM | POA: Diagnosis not present

## 2017-04-23 DIAGNOSIS — Z9889 Other specified postprocedural states: Secondary | ICD-10-CM | POA: Diagnosis not present

## 2017-04-23 LAB — POCT INR: INR: 4.1

## 2017-04-23 NOTE — Patient Instructions (Signed)
Please continue dosage of 1/2 pill every day except 1 pill on Sundays, Tuesdays and Thursdays. Please have some greens today and be consistent w/ intake.  Recheck INR in 2 weeks.

## 2017-04-29 ENCOUNTER — Other Ambulatory Visit: Payer: Self-pay | Admitting: Family Medicine

## 2017-04-30 ENCOUNTER — Ambulatory Visit: Payer: Medicare PPO | Admitting: Internal Medicine

## 2017-05-01 ENCOUNTER — Ambulatory Visit: Payer: Medicare PPO | Admitting: Internal Medicine

## 2017-05-07 ENCOUNTER — Ambulatory Visit (INDEPENDENT_AMBULATORY_CARE_PROVIDER_SITE_OTHER): Payer: Medicare PPO

## 2017-05-07 ENCOUNTER — Other Ambulatory Visit
Admission: RE | Admit: 2017-05-07 | Discharge: 2017-05-07 | Disposition: A | Discharge status: Home / Self Care | Payer: Medicare PPO | Source: Ambulatory Visit | Attending: Cardiovascular Disease | Admitting: Cardiovascular Disease

## 2017-05-07 DIAGNOSIS — Z5181 Encounter for therapeutic drug level monitoring: Secondary | ICD-10-CM

## 2017-05-07 DIAGNOSIS — I4891 Unspecified atrial fibrillation: Secondary | ICD-10-CM | POA: Insufficient documentation

## 2017-05-07 DIAGNOSIS — Z9889 Other specified postprocedural states: Secondary | ICD-10-CM

## 2017-05-07 DIAGNOSIS — G459 Transient cerebral ischemic attack, unspecified: Secondary | ICD-10-CM | POA: Diagnosis not present

## 2017-05-07 DIAGNOSIS — I480 Paroxysmal atrial fibrillation: Secondary | ICD-10-CM

## 2017-05-07 DIAGNOSIS — Z7901 Long term (current) use of anticoagulants: Secondary | ICD-10-CM | POA: Diagnosis not present

## 2017-05-07 LAB — POCT INR: INR: 7.2

## 2017-05-07 LAB — PROTIME-INR
INR: 5.53
PROTHROMBIN TIME: 49.9 s — AB (ref 11.4–15.2)

## 2017-05-07 NOTE — Patient Instructions (Addendum)
Please skip coumadin today and tomorrow, then resume dosage of 1/2 pill every day except 1 pill on Sundays, Tuesdays and Thursdays. Please have some greens today and be consistent w/ intake.  Recheck INR in 2 weeks.

## 2017-05-21 ENCOUNTER — Ambulatory Visit (INDEPENDENT_AMBULATORY_CARE_PROVIDER_SITE_OTHER): Payer: Medicare PPO

## 2017-05-21 DIAGNOSIS — Z5181 Encounter for therapeutic drug level monitoring: Secondary | ICD-10-CM

## 2017-05-21 DIAGNOSIS — I4891 Unspecified atrial fibrillation: Secondary | ICD-10-CM

## 2017-05-21 DIAGNOSIS — Z7901 Long term (current) use of anticoagulants: Secondary | ICD-10-CM

## 2017-05-21 DIAGNOSIS — G459 Transient cerebral ischemic attack, unspecified: Secondary | ICD-10-CM

## 2017-05-21 DIAGNOSIS — Z9889 Other specified postprocedural states: Secondary | ICD-10-CM | POA: Diagnosis not present

## 2017-05-21 DIAGNOSIS — I480 Paroxysmal atrial fibrillation: Secondary | ICD-10-CM | POA: Diagnosis not present

## 2017-05-21 LAB — POCT INR: INR: 5.9

## 2017-05-21 NOTE — Patient Instructions (Signed)
Please skip coumadin today and tomorrow, then start new dosage of 1/2 pill every day. Please have some greens today and be consistent w/ intake.  Recheck INR in 2 weeks.

## 2017-05-29 ENCOUNTER — Other Ambulatory Visit: Payer: Self-pay | Admitting: Internal Medicine

## 2017-06-04 ENCOUNTER — Ambulatory Visit (INDEPENDENT_AMBULATORY_CARE_PROVIDER_SITE_OTHER): Payer: Medicare PPO

## 2017-06-04 DIAGNOSIS — I480 Paroxysmal atrial fibrillation: Secondary | ICD-10-CM | POA: Diagnosis not present

## 2017-06-04 DIAGNOSIS — G459 Transient cerebral ischemic attack, unspecified: Secondary | ICD-10-CM | POA: Diagnosis not present

## 2017-06-04 DIAGNOSIS — Z7901 Long term (current) use of anticoagulants: Secondary | ICD-10-CM | POA: Diagnosis not present

## 2017-06-04 DIAGNOSIS — I4891 Unspecified atrial fibrillation: Secondary | ICD-10-CM | POA: Diagnosis not present

## 2017-06-04 DIAGNOSIS — Z9889 Other specified postprocedural states: Secondary | ICD-10-CM

## 2017-06-04 DIAGNOSIS — Z5181 Encounter for therapeutic drug level monitoring: Secondary | ICD-10-CM

## 2017-06-04 LAB — POCT INR: INR: 3.4

## 2017-06-04 NOTE — Patient Instructions (Signed)
Please continue dosage of 1/2 pill every day. Please be consistent w greens intake.  Recheck INR in 3 weeks.

## 2017-06-11 ENCOUNTER — Encounter: Payer: Self-pay | Admitting: Internal Medicine

## 2017-06-11 ENCOUNTER — Ambulatory Visit: Payer: Medicare PPO | Admitting: Internal Medicine

## 2017-06-11 VITALS — BP 128/84 | HR 52 | Ht 66.0 in | Wt 163.2 lb

## 2017-06-11 DIAGNOSIS — I481 Persistent atrial fibrillation: Secondary | ICD-10-CM

## 2017-06-11 DIAGNOSIS — I38 Endocarditis, valve unspecified: Secondary | ICD-10-CM | POA: Diagnosis not present

## 2017-06-11 DIAGNOSIS — I2510 Atherosclerotic heart disease of native coronary artery without angina pectoris: Secondary | ICD-10-CM | POA: Diagnosis not present

## 2017-06-11 DIAGNOSIS — I4819 Other persistent atrial fibrillation: Secondary | ICD-10-CM

## 2017-06-11 MED ORDER — DILTIAZEM HCL ER COATED BEADS 120 MG PO CP24: 120.0000 mg | ORAL_CAPSULE | Freq: Every day | ORAL | Status: DC

## 2017-06-11 MED ORDER — DILTIAZEM HCL ER COATED BEADS 120 MG PO CP24: 120.0000 mg | ORAL_CAPSULE | Freq: Every day | ORAL | 6 refills | Status: DC

## 2017-06-11 NOTE — Patient Instructions (Signed)
Medication Instructions: - Your physician has recommended you make the following change in your medication:  1) DECREASE diltiazem to 120 mg- take 1 capsule by mouth once daily  Labwork: - none ordered  Procedures/Testing: - none ordered  Follow-Up: - Your physician recommends that you schedule a follow-up appointment in: March with Dr. Okey DupreEnd.   Any Additional Special Instructions Will Be Listed Below (If Applicable).     If you need a refill on your cardiac medications before your next appointment, please call your pharmacy.

## 2017-06-11 NOTE — Progress Notes (Signed)
Follow-up Outpatient Visit Date: 06/11/2017  Primary Care Provider: Malva LimesFisher, Donald E, MD [address] [place] [id number]  Chief Complaint: Follow-up atrial fibrillation/flutter  HPI:  [name] is a 77 y.o. year-old female with history of mitral valve disease status post mechanical MVR in 1980, nonobstructive coronary artery disease, paroxysmal atrial fibrillation and flutter, multiple TIAs, and dementia, who presents for follow-up of atrial flutter and valvular heart disease. She was last seen in our office by Ward Givenshris Berge, NP, in 02/2017. She was started on amiodarone earlier in the month. She was noted to have slow ventricular response at her last visit, prompting deescalation of amiodarone to 200 mg daily.  Today, [name] complains only of generalized fatigue and back pain. Most of the history is provided by her husband, who reports that [name] [name]'s dementia has progressed. She has stable dypsnea on exertion and leg edema. She has had intermittent nose bleeds over the last several weeks in the setting of supratherapeutic INRs. She has not fallen or passed out, nor has she complained of chest pain or palpitations. She seems to be tolerating amiodarone well.  --------------------------------------------------------------------------------------------------  Past Medical History:  Diagnosis Date  . Anxiety   . Carotid stenosis    a. Carotid US (9/14):  Bilat 1-39% => f/u PRN  . Dementia   . Depression   . Gastroesophageal reflux disease   . History of echocardiogram    Echo 2/17: mild LVH, EF 55-60%, mild to mod AS (mean 14 mmHg), mechanical MVR ok (mean 7 mmHgg), mild LAE, PASP 58 mmHg  . HLD (hyperlipidemia)   . HTN (hypertension)   . Mitral regurgitation    a. s/p Bjork Shiley mechanical mitral valve replacement in 1980; b. 08/2016 Echo: EF 60-65%, no rwma, mild AS, mod dil LA, no signif MR, PASP 60mmHg.  . 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  non-obstructive by cath 2007.  [name]. PAF (paroxysmal atrial fibrillation) (HCC)    on chronic coumadin  . Paroxysmal atrial flutter (HCC)    s/p RFA x 2  . TIA (transient ischemic attack)   . Tobacco abuse    Past Surgical History:  Procedure Laterality Date  . A FLUTTER ABLATION     x 2  . ESOPHAGOGASTRODUODENOSCOPY N/A 06/04/2015   Procedure: ESOPHAGOGASTRODUODENOSCOPY (EGD);  Surgeon: Iva Booparl E Gessner, MD;  [facility];  Service: Endoscopy;  Laterality: N/A;  . HIP ARTHROPLASTY Left 05/07/2014   Procedure: ARTHROPLASTY MONOPOLAR HIP;  Surgeon: Eldred MangesMark C Yates, MD;  Location: [facility] OR;  Service: Orthopedics;  Laterality: Left;  . MITRAL VALVE REPLACEMENT     Bjork-Shiley valve placed for mitral regurgitation    Current Meds  Medication Sig  . ALPRAZolam (XANAX) 0.5 MG tablet TAKE 1/2 TO 1 TABLET BY MOUTH EVERY 6 HOURS AS NEEDED FOR ANXIETY  . amiodarone (PACERONE) 200 MG tablet Take 1 tablet (200 mg total) by mouth daily.  . Ascorbic Acid (VITAMIN C) 500 MG tablet Take 500 mg by mouth daily.    [name]. aspirin EC 81 MG EC tablet Take 81 mg by mouth daily.    [name]. atorvastatin (LIPITOR) 40 MG tablet TAKE 1 TABLET BY MOUTH ONCE A DAY  . buPROPion (WELLBUTRIN) 75 MG tablet TAKE 1 TABLET BY MOUTH EVERY MORNING  . Cyanocobalamin (VITAMIN B 12) 250 MCG LOZG Take 500 mg by mouth daily.    . diphenhydramine-acetaminophen (TYLENOL PM EXTRA STRENGTH) 25-500 MG TABS tablet Take 1 tablet by mouth at bedtime.   .[name]  escitalopram (LEXAPRO) 20 MG tablet TAKE 1 TABLET BY MOUTH ONCE DAILY  . ferrous sulfate 325 (65 FE) MG EC tablet Take 325 mg by mouth 3 (three) times daily with meals.  . haloperidol (HALDOL) 1 MG tablet TAKE 1/2 TABLET BY MOUTH ONCE OR TWICE ADAY AS NEEDED FOR ANXIETY  . lisinopril (PRINIVIL,ZESTRIL) 10 MG tablet TAKE 1 TABLET BY MOUTH ONCE DAILY  . metoprolol tartrate (LOPRESSOR) 50 MG tablet Take 1 tablet (50 mg total) by mouth 2 (two) times daily.  . Multiple Vitamins-Calcium (EQL ONE  DAILY WOMENS PO) Take 1 tablet by mouth daily.    . pantoprazole (PROTONIX) 40 MG tablet TAKE 1 TABLET BY MOUTH DAILY BEFORE BREAKFAST  . temazepam (RESTORIL) 30 MG capsule TAKE 1 CAPSULE BY MOUTH EVERY NIGHT AT BEDTIME AS NEEDED FOR SLEEP  . traZODone (DESYREL) 100 MG tablet TAKE 1 TABLET BY MOUTH AT BEDTIME  . warfarin (COUMADIN) 5 MG tablet TAKE 1 TABLET BY MOUTH EVERY DAY EXCEPT ON TUESDAYS, THURSDAYS AND SUNDAYS TAKE 1/2 TABLET (Patient taking differently: TAKE 1 TABLET BY MOUTH EVERY DAY EXCEPT SUNDAYS TAKE 1/2 TABLET)  . [DISCONTINUED] diltiazem (CARDIZEM CD) 240 MG 24 hr capsule Take 1 capsule (240 mg total) by mouth daily.    Allergies: Codeine; Aspirin; and Penicillins  Social History   Socioeconomic History  . Marital status: Married    Spouse name: Not on file  . Number of children: Not on file  . Years of education: Not on file  . Highest education level: Not on file  Social Needs  . Financial resource strain: Not on file  . Food insecurity - worry: Not on file  . Food insecurity - inability: Not on file  . Transportation needs - medical: Not on file  . Transportation needs - non-medical: Not on file  Occupational History  . Not on file  Tobacco Use  . Smoking status: Current Some Day Smoker    Packs/day: 0.25    Years: 35.00    Pack years: 8.75    Types: Cigarettes    Last attempt to quit: 08/25/2015    Years since quitting: 1.8  . Smokeless tobacco: Former Engineer, water and Sexual Activity  . Alcohol use: No  . Drug use: No  . Sexual activity: Not on file  Other Topics Concern  . Not on file  Social History Narrative   Has one son who liver two doors down.     Family History  Problem Relation Age of Onset  . Heart attack Other   . Hypertension Neg Hx   . Stroke Neg Hx     Review of Systems: A 12-system review of systems was performed and was negative except as noted in the  HPI.  --------------------------------------------------------------------------------------------------  Physical Exam: BP 128/84 (BP Location: Left Arm, Patient Position: Sitting, Cuff Size: Normal)   Pulse (!) 52   Ht 5\' 6"  (1.676 m)   Wt 163 lb 4 oz (74 kg)   BMI 26.35 kg/m   General:  Elderly woman seated in a wheelchair. She is accompanied by her husband. HEENT: No conjunctival pallor or scleral icterus. Moist mucous membranes.  OP clear. Neck: Supple without lymphadenopathy, thyromegaly, JVD, or HJR. Lungs: Normal work of breathing. Diminished breath sounds at both bases. No wheezes or crackles. Heart: Bradycardic but regular with mechanical S1. 1/6 systolic murmur. No rubs or gallops. Abd: Bowel sounds present. Soft, NT/ND without hepatosplenomegaly Ext: 1+ pretibial edema bilaterally. Radial, PT, and DP pulses are 2+ bilaterally.  Skin: Warm and dry without rash.  EKG:  Baseline artifact, limiting evaluation. Likely sinus bradycardia with left axis deviation, LVH, and non-specific ST changes.  Lab Results  Component Value Date   WBC 8.5 02/18/2017   HGB 14.1 02/18/2017   HCT 40.7 02/18/2017   MCV 88.5 02/18/2017   PLT 212 02/18/2017    Lab Results  Component Value Date   NA 138 02/18/2017   K 4.1 02/18/2017   CL 101 02/18/2017   CO2 28 02/18/2017   BUN 15 02/18/2017   CREATININE 0.74 02/18/2017   GLUCOSE 160 (H) 02/18/2017   ALT 16 02/18/2017    Lab Results  Component Value Date   CHOL 179 06/14/2016   HDL 61 06/14/2016   LDLCALC 93 06/14/2016   LDLDIRECT 226.0 04/08/2011   TRIG 124 06/14/2016   CHOLHDL 2.9 06/14/2016    --------------------------------------------------------------------------------------------------  ASSESSMENT AND PLAN: Persistent atrial fibrillation/flutter EKG today suggests sinus bradycardia, though baseline artifact limits evaluation. We have agreed to decrease diltiazem to 120 mg daily. We will continue current doses of  metoprolol as well as amiodarone. We will discuss rechecking LFT's and TFT's when she follows up. I do not believe that she would be able to participate in PFT's given advanced dementia. Continue indefinite warfarin, given history of mechanical mitral valve.  Valvular heart disease Stable symptoms, per [name] [name]'s husband. Exam notable for mild volume overload. We discussed adding low-dose furosemide, though [name] [name] and her husband were hesitant to proceed due urinary frequency. I have encouraged sodium restriction. We will readdress at follow-up.  Non-obstructive coronary artery disease No new symptoms. Defer further testing/intervention at this time.  Follow-up: Return to clinic in 2-3 months.  Yvonne Kendall, MD 06/13/2017 7:33 AM

## 2017-06-13 ENCOUNTER — Encounter: Payer: Self-pay | Admitting: Internal Medicine

## 2017-06-13 DIAGNOSIS — I38 Endocarditis, valve unspecified: Secondary | ICD-10-CM | POA: Insufficient documentation

## 2017-06-19 ENCOUNTER — Telehealth: Payer: Self-pay

## 2017-06-19 NOTE — Telephone Encounter (Signed)
Chris from [facility] called to say that the Haldol 1mg  is on back order and no other pharmacy in town has it available. He reports that the patient's husband wanted you to be aware of this. Would you like to change to something different? Please call Thayer OhmChris back at the pharmacy if we make any changes. Thanks!

## 2017-06-20 NOTE — Telephone Encounter (Signed)
Please check with pharmacy and see if it just the 1mg  that is unavailable or is it all doses?

## 2017-06-20 NOTE — Telephone Encounter (Signed)
Called the pharmacy and they report that all of the doses are out of stock. They do not have an estimated date on when it will be back in.

## 2017-06-20 NOTE — Telephone Encounter (Signed)
Please advise 

## 2017-06-25 ENCOUNTER — Ambulatory Visit (INDEPENDENT_AMBULATORY_CARE_PROVIDER_SITE_OTHER): Payer: Medicare PPO

## 2017-06-25 DIAGNOSIS — G459 Transient cerebral ischemic attack, unspecified: Secondary | ICD-10-CM | POA: Diagnosis not present

## 2017-06-25 DIAGNOSIS — I480 Paroxysmal atrial fibrillation: Secondary | ICD-10-CM

## 2017-06-25 DIAGNOSIS — Z5181 Encounter for therapeutic drug level monitoring: Secondary | ICD-10-CM

## 2017-06-25 DIAGNOSIS — I4891 Unspecified atrial fibrillation: Secondary | ICD-10-CM

## 2017-06-25 DIAGNOSIS — Z7901 Long term (current) use of anticoagulants: Secondary | ICD-10-CM

## 2017-06-25 DIAGNOSIS — Z9889 Other specified postprocedural states: Secondary | ICD-10-CM | POA: Diagnosis not present

## 2017-06-25 LAB — POCT INR: INR: 4.8

## 2017-06-25 NOTE — Patient Instructions (Signed)
Please skip coumadin today and tomorrow, then continue dosage of 1/2 pill every day. Please be consistent w greens intake.  Recheck INR in 2 weeks.

## 2017-07-02 ENCOUNTER — Other Ambulatory Visit: Payer: Self-pay | Admitting: Family Medicine

## 2017-07-02 DIAGNOSIS — G4700 Insomnia, unspecified: Secondary | ICD-10-CM

## 2017-07-07 ENCOUNTER — Ambulatory Visit (INDEPENDENT_AMBULATORY_CARE_PROVIDER_SITE_OTHER): Payer: Medicare PPO | Admitting: Family Medicine

## 2017-07-07 ENCOUNTER — Encounter: Payer: Self-pay | Admitting: Family Medicine

## 2017-07-07 VITALS — BP 136/74 | HR 48 | Temp 97.7°F | Resp 16

## 2017-07-07 DIAGNOSIS — H6122 Impacted cerumen, left ear: Secondary | ICD-10-CM | POA: Diagnosis not present

## 2017-07-07 DIAGNOSIS — G309 Alzheimer's disease, unspecified: Secondary | ICD-10-CM | POA: Diagnosis not present

## 2017-07-07 DIAGNOSIS — F0280 Dementia in other diseases classified elsewhere without behavioral disturbance: Secondary | ICD-10-CM | POA: Diagnosis not present

## 2017-07-07 DIAGNOSIS — R300 Dysuria: Secondary | ICD-10-CM

## 2017-07-07 DIAGNOSIS — G4700 Insomnia, unspecified: Secondary | ICD-10-CM | POA: Diagnosis not present

## 2017-07-07 MED ORDER — TEMAZEPAM 15 MG PO CAPS: ORAL_CAPSULE | ORAL | 5 refills | Status: DC

## 2017-07-07 MED ORDER — SULFAMETHOXAZOLE-TRIMETHOPRIM 800-160 MG PO TABS: 1.0000 | ORAL_TABLET | Freq: Two times a day (BID) | ORAL | 0 refills | Status: DC

## 2017-07-07 MED ORDER — CIPROFLOXACIN HCL 250 MG PO TABS: 250.0000 mg | ORAL_TABLET | Freq: Two times a day (BID) | ORAL | 0 refills | Status: AC

## 2017-07-07 NOTE — Progress Notes (Signed)
Patient: [name] [name] Female    DOB: [birth year]   77 y.o.   MRN: [id number] Visit Date: 07/07/2017  Today's Provider: Mila Merryonald Tya Haughey, MD   Chief Complaint  Patient presents with  . Dementia    follow up  . Hypertension    follow up  . Hyperlipidemia    follow up  . Hypothyroidism    follow up   Subjective:    HPI Follow up of Dementia: Patient was last seen for this problem 1 year ago and no changes were made. Patient husband reports this problem is worsened. Patient has been out of Haldol for the past month and husband reports her Anxiety worsens in the afternoons. She is very sleepy earlier in the day, but become very talkative in the afternoons.    Hypertension, follow-up:  BP Readings from Last 3 Encounters:  06/11/17 128/84  03/07/17 (!) 112/54  02/18/17 100/60    She was last seen for hypertension 1 years ago.  BP at that visit was 142/78. Management since that visit includes no chnages. She reports good compliance with treatment. She is not having side effects.  She is not exercising. She is adherent to low salt diet.   Outside blood pressures are not being checked. She is experiencing none.  Patient denies chest pain, chest pressure/discomfort, claudication, dyspnea, exertional chest pressure/discomfort, fatigue, irregular heart beat, lower extremity edema, near-syncope, orthopnea, palpitations, paroxysmal nocturnal dyspnea, syncope and tachypnea.   Cardiovascular risk factors include advanced age (older than 4555 for men, 8265 for women) and hypertension.  Use of agents associated with hypertension: NSAIDS.     Weight trend:  Wt Readings from Last 3 Encounters:  06/11/17 163 lb 4 oz (74 kg)  03/07/17 171 lb 8 oz (77.8 kg)  02/18/17 170 lb 8 oz (77.3 kg)    Current diet: in general, an "unhealthy" diet. Patient has a poor appetite  ------------------------------------------------------------------------  Lipid/Cholesterol, Follow-up:   Last  seen for this1 years ago.  Management changes since that visit include none. . Last Lipid Panel:    Component Value Date/Time   CHOL 179 06/14/2016 1439   TRIG 124 06/14/2016 1439   HDL 61 06/14/2016 1439   CHOLHDL 2.9 06/14/2016 1439   CHOLHDL 4 01/25/2014 1644   VLDL 13.4 01/25/2014 1644   LDLCALC 93 06/14/2016 1439   LDLDIRECT 226.0 04/08/2011 1237    Risk factors for vascular disease include hypercholesterolemia  She reports good compliance with treatment. She is not having side effects.  Current symptoms include none and have been stable. Weight trend: unsure; patient in a wheel chair today Prior visit with dietician: no Current diet: in general, an "unhealthy" diet Current exercise: none  Wt Readings from Last 3 Encounters:  06/11/17 163 lb 4 oz (74 kg)  03/07/17 171 lb 8 oz (77.8 kg)  02/18/17 170 lb 8 oz (77.3 kg)    ------------------------------------------------------------------- Follow up of Hypothyroidism:  Lab Results  Component Value Date   TSH 2.593 02/18/2017   Patient's husband also reports her urine has been very strong smelling for the last couple of weeks.     Allergies  Allergen Reactions  . Codeine Other (See Comments)  . Aspirin     unknown  . Penicillins     Has patient had a PCN reaction causing immediate rash, facial/tongue/throat swelling, SOB or lightheadedness with hypotension: NO Has patient had a PCN reaction causing severe rash involving mucus membranes or skin necrosis: NO Has patient  had a PCN reaction that required hospitalization NO Has patient had a PCN reaction occurring within the last 10 years: NO If all of the above answers are "NO", then may proceed with Cephalosporin use.     Current Outpatient Medications:  .  ALPRAZolam (XANAX) 0.5 MG tablet, TAKE 1/2 TO 1 TABLET BY MOUTH EVERY 6 HOURS AS NEEDED FOR ANXIETY, Disp: 60 tablet, Rfl: 4 .  amiodarone (PACERONE) 200 MG tablet, Take 1 tablet (200 mg total) by mouth  daily., Disp: 90 tablet, Rfl: 3 .  Ascorbic Acid (VITAMIN C) 500 MG tablet, Take 500 mg by mouth daily.  , Disp: , Rfl:  .  aspirin EC 81 MG EC tablet, Take 81 mg by mouth daily.  , Disp: , Rfl:  .  atorvastatin (LIPITOR) 40 MG tablet, TAKE 1 TABLET BY MOUTH ONCE A DAY, Disp: 90 tablet, Rfl: 4 .  buPROPion (WELLBUTRIN) 75 MG tablet, TAKE 1 TABLET BY MOUTH EVERY MORNING, Disp: 90 tablet, Rfl: 4 .  Cyanocobalamin (VITAMIN B 12) 250 MCG LOZG, Take 500 mg by mouth daily.  , Disp: , Rfl:  .  diltiazem (CARDIZEM CD) 120 MG 24 hr capsule, Take 1 capsule (120 mg total) by mouth daily., Disp: 30 capsule, Rfl: 6 .  diphenhydramine-acetaminophen (TYLENOL PM EXTRA STRENGTH) 25-500 MG TABS tablet, Take 1 tablet by mouth at bedtime. , Disp: , Rfl:  .  escitalopram (LEXAPRO) 20 MG tablet, TAKE 1 TABLET BY MOUTH ONCE DAILY, Disp: 90 tablet, Rfl: 4 .  ferrous sulfate 325 (65 FE) MG EC tablet, Take 325 mg by mouth 3 (three) times daily with meals., Disp: , Rfl:  .  haloperidol (HALDOL) 1 MG tablet, TAKE 1/2 TABLET BY MOUTH ONCE OR TWICE ADAY AS NEEDED FOR ANXIETY, Disp: 15 tablet, Rfl: 12 .  lisinopril (PRINIVIL,ZESTRIL) 10 MG tablet, TAKE 1 TABLET BY MOUTH ONCE DAILY, Disp: 30 tablet, Rfl: 2 .  Multiple Vitamins-Calcium (EQL ONE DAILY WOMENS PO), Take 1 tablet by mouth daily.  , Disp: , Rfl:  .  pantoprazole (PROTONIX) 40 MG tablet, TAKE 1 TABLET BY MOUTH DAILY BEFORE BREAKFAST, Disp: 90 tablet, Rfl: 4 .  temazepam (RESTORIL) 30 MG capsule, TAKE 1 CAPSULE BY MOUTH EVERY NIGHT AT BEDTIME AS NEEDED, Disp: 30 capsule, Rfl: 4 .  traZODone (DESYREL) 100 MG tablet, TAKE 1 TABLET BY MOUTH AT BEDTIME, Disp: 30 tablet, Rfl: 12 .  warfarin (COUMADIN) 5 MG tablet, TAKE 1 TABLET BY MOUTH EVERY DAY EXCEPT ON TUESDAYS, THURSDAYS AND SUNDAYS TAKE 1/2 TABLET (Patient taking differently: TAKE 1 TABLET BY MOUTH EVERY DAY EXCEPT SUNDAYS TAKE 1/2 TABLET), Disp: 30 tablet, Rfl: 4 .  metoprolol tartrate (LOPRESSOR) 50 MG tablet, Take 1  tablet (50 mg total) by mouth 2 (two) times daily., Disp: 180 tablet, Rfl: 3  Review of Systems  Constitutional: Positive for appetite change. Negative for chills, fatigue and fever.  Respiratory: Negative for chest tightness and shortness of breath.   Cardiovascular: Negative for chest pain and palpitations.  Gastrointestinal: Negative for abdominal pain, nausea and vomiting.  Neurological: Negative for dizziness and weakness.  Psychiatric/Behavioral: Positive for confusion. The patient is nervous/anxious.     Social History   Tobacco Use  . Smoking status: Current Some Day Smoker    Packs/day: 0.25    Years: 35.00    Pack years: 8.75    Types: Cigarettes    Last attempt to quit: 08/25/2015    Years since quitting: 1.8  . Smokeless tobacco: Former Neurosurgeon  Substance Use Topics  . Alcohol use: No   Objective:   BP 136/74 (BP Location: Right Arm, Patient Position: Sitting, Cuff Size: Normal)   Pulse (!) 48   Temp 97.7 F (36.5 C) (Oral)   Resp 16   SpO2 95% Comment: room air There were no vitals filed for this visit.   Physical Exam   General Appearance:    Alert, cooperative, no distress, aware of conversation, but talks sparingly.   Eyes:    PERRL, conjunctiva/corneas clear, EOM's intact       Lungs:     Clear to auscultation bilaterally, respirations unlabored  Heart:    Regular rate and rhythm  Neurologic:   Awake, alert, oriented x 1. No apparent focal neurological           defect.   ENT:   Left ear canal obstructed, right with moderate amount of cerumen.        Assessment & Plan:     1. Alzheimer's dementia without behavioral disturbance, unspecified timing of dementia onset Gradually worsening. Discussed affect of sedatives on cognition and she would likely be more alert if we cut back and wean sleeping medications as much as possible. She already off of haloperidol due to mfg backorder. Will reduce temazepam as below.  Consider adding Aricept if cognition does  not improve as sedatives are weaned.   2. Insomnia, unspecified type Reduce to - temazepam (RESTORIL) 15 MG capsule; TAKE 1 CAPSULE BY MOUTH EVERY NIGHT AT BEDTIME AS NEEDED  Dispense: 30 capsule; Refill: 5 Anticipated weaning further if stable at follow up.   3. Dysuria Likely has UTI which may be exarbating dementia.  - ciprofloxacin (CIPRO) 250 MG tablet; Take 1 tablet (250 mg total) by mouth 2 (two) times daily for 7 days.  Dispense: 6 tablet; Refill: 0  4. Impacted cerumen of left ear After soaking with Debrox, ear canal was irrigated with water until clear. Patient tolerated procedure well.         Mila Merry, MD  [facility]

## 2017-07-09 ENCOUNTER — Ambulatory Visit (INDEPENDENT_AMBULATORY_CARE_PROVIDER_SITE_OTHER): Payer: Medicare PPO

## 2017-07-09 ENCOUNTER — Telehealth: Payer: Self-pay

## 2017-07-09 DIAGNOSIS — Z7901 Long term (current) use of anticoagulants: Secondary | ICD-10-CM

## 2017-07-09 DIAGNOSIS — G459 Transient cerebral ischemic attack, unspecified: Secondary | ICD-10-CM

## 2017-07-09 DIAGNOSIS — Z5181 Encounter for therapeutic drug level monitoring: Secondary | ICD-10-CM

## 2017-07-09 DIAGNOSIS — I480 Paroxysmal atrial fibrillation: Secondary | ICD-10-CM

## 2017-07-09 DIAGNOSIS — Z9889 Other specified postprocedural states: Secondary | ICD-10-CM | POA: Diagnosis not present

## 2017-07-09 DIAGNOSIS — I4891 Unspecified atrial fibrillation: Secondary | ICD-10-CM | POA: Diagnosis not present

## 2017-07-09 LAB — POCT INR: INR: 7.3

## 2017-07-09 NOTE — Telephone Encounter (Signed)
Spoke w/ pt's husband.  Advised him of Dr. Serita KyleEnd's recommendation.  He verbalizes understanding and will keep appt next week.

## 2017-07-09 NOTE — Telephone Encounter (Signed)
Thank you for the update. I agree with stopping diltiazem and follow-up with Ward Givenshris Berge, NP, next week for reassessment of bradycardia. Thanks.  Yvonne Kendallhristopher Lizbeth Feijoo, MD [facility] Pager: [phone]

## 2017-07-09 NOTE — Telephone Encounter (Signed)
Pt came in for INR check today and husband mentioned that pt's HR has been running low. Her HR @ PCP's office on 2/25 was 48. Pt has dementia and sleeps a lot at her baseline, but husband reports that she falls asleep easily and has been more tired recently. Pt's diltiazem dose was decreased at last ov and pt's husband would like to know if this needs to be decreased further.  Pt's INR is supratherapeutic today @ 7.3, as she is on Cipro 250 mg BID x 7 days for UTI. She is sched to come back in 1 week for recheck and will see Ward Givenshris Berge, NP at that time. Advised pt and husband that I will make Dr. Okey DupreEnd aware of their concerns and call them back if he would like to make any changes before pt's appt next week.

## 2017-07-09 NOTE — Telephone Encounter (Signed)
Left message for John to call back

## 2017-07-09 NOTE — Patient Instructions (Signed)
Please skip coumadin today, tomorrow, Friday and Saturday, then on Sunday resume dosage of 1/2 pill every day. Please be consistent w greens intake.  Please call the office @ [phone] if she is put on any other antibiotics. Recheck INR in 2 weeks.

## 2017-07-16 ENCOUNTER — Ambulatory Visit (INDEPENDENT_AMBULATORY_CARE_PROVIDER_SITE_OTHER): Payer: Medicare PPO

## 2017-07-16 ENCOUNTER — Encounter: Payer: Self-pay | Admitting: Nurse Practitioner

## 2017-07-16 ENCOUNTER — Ambulatory Visit: Payer: Medicare PPO | Admitting: Nurse Practitioner

## 2017-07-16 VITALS — BP 102/58 | HR 52 | Ht 66.0 in | Wt 158.5 lb

## 2017-07-16 DIAGNOSIS — Z79899 Other long term (current) drug therapy: Secondary | ICD-10-CM

## 2017-07-16 DIAGNOSIS — Z9889 Other specified postprocedural states: Secondary | ICD-10-CM

## 2017-07-16 DIAGNOSIS — I4891 Unspecified atrial fibrillation: Secondary | ICD-10-CM | POA: Diagnosis not present

## 2017-07-16 DIAGNOSIS — G459 Transient cerebral ischemic attack, unspecified: Secondary | ICD-10-CM

## 2017-07-16 DIAGNOSIS — I38 Endocarditis, valve unspecified: Secondary | ICD-10-CM | POA: Diagnosis not present

## 2017-07-16 DIAGNOSIS — Z5181 Encounter for therapeutic drug level monitoring: Secondary | ICD-10-CM

## 2017-07-16 DIAGNOSIS — Z7901 Long term (current) use of anticoagulants: Secondary | ICD-10-CM | POA: Diagnosis not present

## 2017-07-16 DIAGNOSIS — Z952 Presence of prosthetic heart valve: Secondary | ICD-10-CM | POA: Diagnosis not present

## 2017-07-16 DIAGNOSIS — I480 Paroxysmal atrial fibrillation: Secondary | ICD-10-CM

## 2017-07-16 DIAGNOSIS — I484 Atypical atrial flutter: Secondary | ICD-10-CM

## 2017-07-16 DIAGNOSIS — I481 Persistent atrial fibrillation: Secondary | ICD-10-CM | POA: Diagnosis not present

## 2017-07-16 LAB — POCT INR: INR: 1.5

## 2017-07-16 NOTE — Patient Instructions (Signed)
Please take whole tablet today and tomorrow, then resume dosage of 1/2 pill every day. Please be consistent w greens intake.  Please call the office @ [phone] if she is put on any other antibiotics. Recheck INR in 2 weeks.

## 2017-07-16 NOTE — Progress Notes (Signed)
Office Visit    Patient Name: [name] [name] Date of Encounter: 07/16/2017  Primary Care Provider:  Malva Limes, MD Primary Cardiologist:  Yvonne Kendall, MD  Chief Complaint    77 year old female with a history of mitral valve disease status post mechanical mitral valve replacement in 1980 on chronic Coumadin, nonobstructive CAD, paroxysmal atrial fibrillation and flutter status post prior catheter ablation, multiple TIAs, and dementia who presents for follow-up related to bradycardia.  Past Medical History    Past Medical History:  Diagnosis Date  . Anxiety   . Carotid stenosis    a. Carotid US (9/14):  Bilat 1-39% => f/u PRN  . Dementia   . Depression   . Gastroesophageal reflux disease   . History of echocardiogram    Echo 2/17: mild LVH, EF 55-60%, mild to mod AS (mean 14 mmHg), mechanical MVR ok (mean 7 mmHgg), mild LAE, PASP 58 mmHg  . HLD (hyperlipidemia)   . HTN (hypertension)   . Mitral regurgitation    a. s/p Bjork Shiley mechanical mitral valve replacement in 1980; b. 08/2016 Echo: EF 60-65%, no rwma, mild AS, mod dil LA, no signif MR, PASP .  . 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 non-obstructive by cath 2007.  [name] PAF (paroxysmal atrial fibrillation) (HCC)    on chronic coumadin  . Paroxysmal atrial flutter (HCC)    s/p RFA x 2  . TIA (transient ischemic attack)   . Tobacco abuse    Past Surgical History:  Procedure Laterality Date  . A FLUTTER ABLATION     x 2  . ESOPHAGOGASTRODUODENOSCOPY N/A 06/04/2015   Procedure: ESOPHAGOGASTRODUODENOSCOPY (EGD);  Surgeon: Iva Boop, MD;  Location: [facility] ENDOSCOPY;  Service: Endoscopy;  Laterality: N/A;  . HIP ARTHROPLASTY Left 05/07/2014   Procedure: ARTHROPLASTY MONOPOLAR HIP;  Surgeon: Eldred Manges, MD;  Location: [facility] OR;  Service: Orthopedics;  Laterality: Left;  . MITRAL VALVE REPLACEMENT     Bjork-Shiley valve placed for mitral regurgitation    Allergies  Allergies  Allergen  Reactions  . Codeine Other (See Comments)  . Aspirin     unknown  . Penicillins     Has patient had a PCN reaction causing immediate rash, facial/tongue/throat swelling, SOB or lightheadedness with hypotension: NO Has patient had a PCN reaction causing severe rash involving mucus membranes or skin necrosis: NO Has patient had a PCN reaction that required hospitalization NO Has patient had a PCN reaction occurring within the last 10 years: NO If all of the above answers are "NO", then may proceed with Cephalosporin use.    History of Present Illness    77 year old female with the above complex past medical history including mitral valve disease status post mechanical mitral valve replacement in 1980.  She is on chronic Coumadin.  Other history includes nonobstructive CAD, paroxysmal atrial fibrillation, paroxysmal atrial flutter status post catheter ablation, multiple TIAs, and dementia.  In the fall 2018, she was noted to be in atrial flutter with 2-1 conduction but was asymptomatic.  She was subsequently placed on amiodarone.  When seen in clinic in late October, she was in slow A. fib and amiodarone dose was reduced to 200 mg daily.  She subsequently followed up January 30 and was noted to be in sinus bradycardia.  Diltiazem dose was reduced.  Patient's husband called on February 27 to report heart rates in the 40s and recommendation was made for discontinuation of diltiazem.  Since coming off of diltiazem,  patient has been stable.  She has no complaints though dementia limits her recall ability.  Her husband is with her today and says that she has not complained of any chest pain, dyspnea, palpitations, presyncope, or syncope.  He has not checked her heart rate discontinuing diltiazem.  Home Medications    Prior to Admission medications   Medication Sig Start Date End Date Taking? Authorizing Provider  ALPRAZolam Prudy Feeler) 0.5 MG tablet TAKE 1/2 TO 1 TABLET BY MOUTH EVERY 6 HOURS AS NEEDED FOR  ANXIETY 04/29/17   Malva Limes, MD  amiodarone (PACERONE) 200 MG tablet Take 1 tablet (200 mg total) by mouth daily. 03/07/17   Creig Hines, NP  Ascorbic Acid (VITAMIN C) 500 MG tablet Take 500 mg by mouth daily.      [provider]  aspirin EC 81 MG EC tablet Take 81 mg by mouth daily.      [provider]  atorvastatin (LIPITOR) 40 MG tablet TAKE 1 TABLET BY MOUTH ONCE A DAY 07/29/16   Malva Limes, MD  buPROPion [facility]) 75 MG tablet TAKE 1 TABLET BY MOUTH EVERY MORNING 04/15/16   Malva Limes, MD  Cyanocobalamin (VITAMIN B 12) 250 MCG LOZG Take 500 mg by mouth daily.      [provider]  diltiazem (CARDIZEM CD) 120 MG 24 hr capsule Take 1 capsule (120 mg total) by mouth daily. 06/11/17 09/09/17  End, Cristal Deer, MD  diphenhydramine-acetaminophen (TYLENOL PM EXTRA STRENGTH) 25-500 MG TABS tablet Take 1 tablet by mouth at bedtime.  01/07/11   [provider]  escitalopram (LEXAPRO) 20 MG tablet TAKE 1 TABLET BY MOUTH ONCE DAILY 02/28/17   Malva Limes, MD  ferrous sulfate 325 (65 FE) MG EC tablet Take 325 mg by mouth 3 (three) times daily with meals.    [provider]  lisinopril (PRINIVIL,ZESTRIL) 10 MG tablet TAKE 1 TABLET BY MOUTH ONCE DAILY 05/29/17   End, Cristal Deer, MD  metoprolol tartrate (LOPRESSOR) 50 MG tablet Take 1 tablet (50 mg total) by mouth 2 (two) times daily. 02/05/17 06/11/17  End, Cristal Deer, MD  Multiple Vitamins-Calcium (EQL ONE DAILY WOMENS PO) Take 1 tablet by mouth daily.      [provider]  pantoprazole (PROTONIX) 40 MG tablet TAKE 1 TABLET BY MOUTH DAILY BEFORE BREAKFAST 07/03/17   Malva Limes, MD  temazepam (RESTORIL) 15 MG capsule TAKE 1 CAPSULE BY MOUTH EVERY NIGHT AT BEDTIME AS NEEDED 07/07/17   Malva Limes, MD  traZODone (DESYREL) 100 MG tablet TAKE 1 TABLET BY MOUTH AT BEDTIME 07/03/17   Malva Limes, MD  warfarin (COUMADIN) 5 MG tablet TAKE 1 TABLET BY MOUTH EVERY  DAY EXCEPT ON TUESDAYS, THURSDAYS AND SUNDAYS TAKE 1/2 TABLET Patient taking differently: TAKE 1 TABLET BY MOUTH EVERY DAY EXCEPT SUNDAYS TAKE 1/2 TABLET 12/31/16   Malva Limes, MD    Review of Systems    Patient has been stable at home.  She and her husband deny any reports of chest pain, dyspnea, palpitations, PND, orthopnea, dizziness, syncope, or early satiety.  She does often note mild lower extremity swelling above her socks..  All other systems reviewed and are otherwise negative except as noted above.  Physical Exam    VS:  BP (!) 102/58 (BP Location: Left Arm, Patient Position: Sitting, Cuff Size: Normal)   Pulse (!) 52   Ht 5\' 6"  (1.676 m)   Wt 158 lb 8 oz (71.9 kg)   BMI  25.58 kg/m  , BMI Body mass index is 25.58 kg/m. GEN: Well nourished, well developed, in no acute distress.  HEENT: normal.  Neck: Supple, no JVD, carotid bruits, or masses. Cardiac: RRR, mechanical S1, 3/6 systolic murmur loudest at the left upper and lower sternal border with a 2/6 diastolic murmur at the apex, no rubs, or gallops. No clubbing, cyanosis 1+ bilateral ankle edema just above her socks.  Radials/DP/PT 1 + and equal bilaterally.  Respiratory:  Respirations regular and unlabored, clear to auscultation bilaterally. GI: Soft, nontender, nondistended, BS + x 4. MS: no deformity or atrophy. Skin: warm and dry, no rash. Neuro:  Strength and sensation are intact.  Alert and oriented to person and place. Psych: Normal affect  Accessory Clinical Findings    ECG -sinus bradycardia, 52, nonspecific ST changes, prolonged QT.  Assessment & Plan    1.  Paroxysmal atrial fibrillation and atypical atrial flutter: Maintaining sinus bradycardia on amiodarone 200 mg daily and metoprolol 50 twice daily.  She had previously been on diltiazem and this was discontinued in the setting of heart rates in the 40s noted by her husband at home.  She was asymptomatic at that time.  Her husband has not repeated her  heart rate since discontinuation of diltiazem.  Heart rate is 52 today.  She has no complaints related to either palpitations or presyncope.  Continue current dose of amiodarone and metoprolol.  She is anticoagulated with Coumadin and is due for an INR today.  I will also follow-up LFTs and TSH given that she is coming up on 6 months of amiodarone therapy.  With dementia, she would not likely be able to perform PFTs.  2.  Valvular heart disease status post mechanical mitral valve: No new symptoms.  She has mild ankle edema just above her sock line.  She spends most of her day sitting.  She had previously been offered low-dose Lasix but she and her husband are hesitant in the setting of urinary frequency already.  I have encouraged sodium restriction and leg elevation.  INR has been supratherapeutic more or less since starting amiodarone and even more recently in the setting of an infection requiring Cipro therapy.  She is due for an INR today.  3.  Nonobstructive CAD: No new symptoms.  Continue medical therapy.  4.  Disposition: Follow-up with Dr. Okey DupreEnd in 3 months or sooner if necessary.  Nicolasa Duckinghristopher Darnelle Derrick, NP 07/16/2017, 3:09 PM

## 2017-07-16 NOTE — Patient Instructions (Signed)
Medication Instructions: - Your physician recommends that you continue on your current medications as directed. Please refer to the Current Medication list given to you today.  Labwork: - Your physician recommends that you have lab work today: TSH/ Liver  Procedures/Testing: - none ordered  Follow-Up: - Your physician recommends that you schedule a follow-up appointment in: 4 months with Dr. Okey DupreEnd   Any Additional Special Instructions Will Be Listed Below (If Applicable).     If you need a refill on your cardiac medications before your next appointment, please call your pharmacy.

## 2017-07-17 LAB — HEPATIC FUNCTION PANEL
ALK PHOS: 124 IU/L — AB (ref 39–117)
ALT: 26 IU/L (ref 0–32)
AST: 29 IU/L (ref 0–40)
Albumin: 3.6 g/dL (ref 3.5–4.8)
Bilirubin Total: 0.8 mg/dL (ref 0.0–1.2)
Bilirubin, Direct: 0.24 mg/dL (ref 0.00–0.40)
TOTAL PROTEIN: 6.8 g/dL (ref 6.0–8.5)

## 2017-07-17 LAB — TSH: TSH: 9.63 u[IU]/mL — ABNORMAL HIGH (ref 0.450–4.500)

## 2017-07-21 ENCOUNTER — Other Ambulatory Visit: Payer: Self-pay | Admitting: *Deleted

## 2017-07-21 DIAGNOSIS — R7989 Other specified abnormal findings of blood chemistry: Secondary | ICD-10-CM

## 2017-07-28 ENCOUNTER — Telehealth: Payer: Self-pay

## 2017-07-28 NOTE — Telephone Encounter (Signed)
Mr. Rada HayMurrell reports that patient is worsening and will most likely need to go into an assisted living. Mr. Rada HayMurrell reports that he has spoken to patients social worker, and was advised to schedule an office visit with PCP. sd

## 2017-07-29 ENCOUNTER — Ambulatory Visit (INDEPENDENT_AMBULATORY_CARE_PROVIDER_SITE_OTHER): Payer: Medicare PPO | Admitting: Family Medicine

## 2017-07-29 ENCOUNTER — Observation Stay
Admission: EM | Admit: 2017-07-29 | Discharge: 2017-08-03 | Disposition: A | Discharge status: Skilled Nursing Facility | Payer: Medicare PPO | Attending: Internal Medicine | Admitting: Internal Medicine

## 2017-07-29 ENCOUNTER — Other Ambulatory Visit: Payer: Self-pay

## 2017-07-29 ENCOUNTER — Emergency Department: Payer: Medicare PPO

## 2017-07-29 ENCOUNTER — Encounter: Payer: Self-pay | Admitting: Family Medicine

## 2017-07-29 VITALS — BP 110/54 | HR 74 | Temp 98.8°F | Resp 16

## 2017-07-29 DIAGNOSIS — Z880 Allergy status to penicillin: Secondary | ICD-10-CM | POA: Diagnosis not present

## 2017-07-29 DIAGNOSIS — R0902 Hypoxemia: Secondary | ICD-10-CM | POA: Diagnosis not present

## 2017-07-29 DIAGNOSIS — Z7982 Long term (current) use of aspirin: Secondary | ICD-10-CM | POA: Diagnosis not present

## 2017-07-29 DIAGNOSIS — M6281 Muscle weakness (generalized): Secondary | ICD-10-CM | POA: Diagnosis not present

## 2017-07-29 DIAGNOSIS — R791 Abnormal coagulation profile: Secondary | ICD-10-CM | POA: Insufficient documentation

## 2017-07-29 DIAGNOSIS — E860 Dehydration: Secondary | ICD-10-CM | POA: Insufficient documentation

## 2017-07-29 DIAGNOSIS — I340 Nonrheumatic mitral (valve) insufficiency: Secondary | ICD-10-CM | POA: Insufficient documentation

## 2017-07-29 DIAGNOSIS — F0390 Unspecified dementia without behavioral disturbance: Secondary | ICD-10-CM | POA: Insufficient documentation

## 2017-07-29 DIAGNOSIS — F419 Anxiety disorder, unspecified: Secondary | ICD-10-CM | POA: Insufficient documentation

## 2017-07-29 DIAGNOSIS — K219 Gastro-esophageal reflux disease without esophagitis: Secondary | ICD-10-CM | POA: Insufficient documentation

## 2017-07-29 DIAGNOSIS — G9340 Encephalopathy, unspecified: Secondary | ICD-10-CM | POA: Diagnosis present

## 2017-07-29 DIAGNOSIS — Z886 Allergy status to analgesic agent status: Secondary | ICD-10-CM | POA: Diagnosis not present

## 2017-07-29 DIAGNOSIS — Z952 Presence of prosthetic heart valve: Secondary | ICD-10-CM | POA: Insufficient documentation

## 2017-07-29 DIAGNOSIS — I2510 Atherosclerotic heart disease of native coronary artery without angina pectoris: Secondary | ICD-10-CM | POA: Diagnosis not present

## 2017-07-29 DIAGNOSIS — R627 Adult failure to thrive: Secondary | ICD-10-CM | POA: Insufficient documentation

## 2017-07-29 DIAGNOSIS — I10 Essential (primary) hypertension: Secondary | ICD-10-CM | POA: Diagnosis not present

## 2017-07-29 DIAGNOSIS — F0280 Dementia in other diseases classified elsewhere without behavioral disturbance: Secondary | ICD-10-CM | POA: Diagnosis not present

## 2017-07-29 DIAGNOSIS — G309 Alzheimer's disease, unspecified: Secondary | ICD-10-CM | POA: Diagnosis not present

## 2017-07-29 DIAGNOSIS — F329 Major depressive disorder, single episode, unspecified: Secondary | ICD-10-CM | POA: Insufficient documentation

## 2017-07-29 DIAGNOSIS — Z7901 Long term (current) use of anticoagulants: Secondary | ICD-10-CM | POA: Insufficient documentation

## 2017-07-29 DIAGNOSIS — J101 Influenza due to other identified influenza virus with other respiratory manifestations: Secondary | ICD-10-CM | POA: Diagnosis not present

## 2017-07-29 DIAGNOSIS — Z8673 Personal history of transient ischemic attack (TIA), and cerebral infarction without residual deficits: Secondary | ICD-10-CM | POA: Insufficient documentation

## 2017-07-29 DIAGNOSIS — N390 Urinary tract infection, site not specified: Secondary | ICD-10-CM | POA: Diagnosis present

## 2017-07-29 DIAGNOSIS — E785 Hyperlipidemia, unspecified: Secondary | ICD-10-CM | POA: Insufficient documentation

## 2017-07-29 DIAGNOSIS — Z79899 Other long term (current) drug therapy: Secondary | ICD-10-CM | POA: Diagnosis not present

## 2017-07-29 DIAGNOSIS — Z885 Allergy status to narcotic agent status: Secondary | ICD-10-CM | POA: Diagnosis not present

## 2017-07-29 DIAGNOSIS — R0602 Shortness of breath: Secondary | ICD-10-CM

## 2017-07-29 DIAGNOSIS — I480 Paroxysmal atrial fibrillation: Secondary | ICD-10-CM | POA: Insufficient documentation

## 2017-07-29 LAB — URINALYSIS, COMPLETE (UACMP) WITH MICROSCOPIC
BILIRUBIN URINE: NEGATIVE
Glucose, UA: NEGATIVE mg/dL
HGB URINE DIPSTICK: NEGATIVE
KETONES UR: NEGATIVE mg/dL
NITRITE: NEGATIVE
Protein, ur: 30 mg/dL — AB
Specific Gravity, Urine: 1.021 (ref 1.005–1.030)
pH: 5 (ref 5.0–8.0)

## 2017-07-29 LAB — CBC
HEMATOCRIT: 36.4 % (ref 35.0–47.0)
HEMOGLOBIN: 11.9 g/dL — AB (ref 12.0–16.0)
MCH: 27.8 pg (ref 26.0–34.0)
MCHC: 32.7 g/dL (ref 32.0–36.0)
MCV: 85 fL (ref 80.0–100.0)
Platelets: 305 10*3/uL (ref 150–440)
RBC: 4.28 MIL/uL (ref 3.80–5.20)
RDW: 16.8 % — ABNORMAL HIGH (ref 11.5–14.5)
WBC: 9.7 10*3/uL (ref 3.6–11.0)

## 2017-07-29 LAB — BASIC METABOLIC PANEL
ANION GAP: 10 (ref 5–15)
BUN: 18 mg/dL (ref 6–20)
CHLORIDE: 103 mmol/L (ref 101–111)
CO2: 29 mmol/L (ref 22–32)
Calcium: 9 mg/dL (ref 8.9–10.3)
Creatinine, Ser: 0.76 mg/dL (ref 0.44–1.00)
GFR calc non Af Amer: >60 mL/min (ref >60)
Glucose, Bld: 113 mg/dL — ABNORMAL HIGH (ref 65–99)
POTASSIUM: 3 mmol/L — AB (ref 3.5–5.1)
Sodium: 142 mmol/L (ref 135–145)

## 2017-07-29 LAB — BRAIN NATRIURETIC PEPTIDE: B NATRIURETIC PEPTIDE 5: 332 pg/mL — AB (ref 0.0–100.0)

## 2017-07-29 LAB — INFLUENZA PANEL BY PCR (TYPE A & B)
Influenza A By PCR: POSITIVE — AB
Influenza B By PCR: NEGATIVE

## 2017-07-29 LAB — PROTIME-INR
INR: 5.23
PROTHROMBIN TIME: 47.7 s — AB (ref 11.4–15.2)

## 2017-07-29 LAB — TROPONIN I: Troponin I: 0.11 ng/mL (ref <0.03)

## 2017-07-29 MED ORDER — ESCITALOPRAM OXALATE 10 MG PO TABS
20.0000 mg | ORAL_TABLET | Freq: Every day | ORAL | Status: DC
  Administered 2017-07-30 – 2017-08-03 (×5): 20 mg via ORAL
  Filled 2017-07-29 (×5): qty 2

## 2017-07-29 MED ORDER — CIPROFLOXACIN IN D5W 400 MG/200ML IV SOLN
INTRAVENOUS | Status: AC
  Filled 2017-07-29: qty 200

## 2017-07-29 MED ORDER — PANTOPRAZOLE SODIUM 40 MG PO TBEC
40.0000 mg | DELAYED_RELEASE_TABLET | Freq: Every day | ORAL | Status: DC
  Administered 2017-07-30 – 2017-08-03 (×5): 40 mg via ORAL
  Filled 2017-07-29 (×5): qty 1

## 2017-07-29 MED ORDER — SODIUM CHLORIDE 0.9 % IV SOLN
Freq: Once | INTRAVENOUS | Status: AC
  Administered 2017-07-29: 22:00:00 via INTRAVENOUS

## 2017-07-29 MED ORDER — CIPROFLOXACIN IN D5W 400 MG/200ML IV SOLN
400.0000 mg | Freq: Two times a day (BID) | INTRAVENOUS | Status: DC
  Administered 2017-07-29 – 2017-07-30 (×2): 400 mg via INTRAVENOUS
  Filled 2017-07-29 (×2): qty 200

## 2017-07-29 MED ORDER — ACETAMINOPHEN 325 MG PO TABS
650.0000 mg | ORAL_TABLET | Freq: Four times a day (QID) | ORAL | Status: DC | PRN
  Administered 2017-07-30 – 2017-08-03 (×4): 650 mg via ORAL
  Filled 2017-07-29 (×4): qty 2

## 2017-07-29 MED ORDER — ACETAMINOPHEN 650 MG RE SUPP: 650.0000 mg | Freq: Four times a day (QID) | RECTAL | Status: DC | PRN

## 2017-07-29 MED ORDER — SODIUM CHLORIDE 0.9 % IV BOLUS (SEPSIS)
500.0000 mL | Freq: Once | INTRAVENOUS | Status: AC
Start: 2017-07-29 — End: 2017-07-29
  Administered 2017-07-29: 500 mL via INTRAVENOUS

## 2017-07-29 MED ORDER — VITAMIN C 500 MG PO TABS
500.0000 mg | ORAL_TABLET | Freq: Every day | ORAL | Status: DC
  Administered 2017-07-30 – 2017-08-03 (×5): 500 mg via ORAL
  Filled 2017-07-29 (×5): qty 1

## 2017-07-29 MED ORDER — AMIODARONE HCL 200 MG PO TABS
200.0000 mg | ORAL_TABLET | Freq: Every day | ORAL | Status: DC
  Administered 2017-07-30 – 2017-08-03 (×5): 200 mg via ORAL
  Filled 2017-07-29 (×5): qty 1

## 2017-07-29 MED ORDER — POLYETHYLENE GLYCOL 3350 17 G PO PACK
17.0000 g | PACK | Freq: Every day | ORAL | Status: DC | PRN
  Administered 2017-07-31: 13:00:00 17 g via ORAL
  Filled 2017-07-29: qty 1

## 2017-07-29 MED ORDER — OSELTAMIVIR PHOSPHATE 30 MG PO CAPS
30.0000 mg | ORAL_CAPSULE | Freq: Two times a day (BID) | ORAL | Status: AC
  Administered 2017-07-29 – 2017-08-03 (×10): 30 mg via ORAL
  Filled 2017-07-29 (×10): qty 1

## 2017-07-29 MED ORDER — VITAMIN B 12 250 MCG PO LOZG: 500.0000 mg | LOZENGE | Freq: Every day | ORAL | Status: DC

## 2017-07-29 MED ORDER — ALBUTEROL SULFATE (2.5 MG/3ML) 0.083% IN NEBU: 2.5000 mg | INHALATION_SOLUTION | Freq: Four times a day (QID) | RESPIRATORY_TRACT | Status: DC | PRN

## 2017-07-29 MED ORDER — FERROUS SULFATE 325 (65 FE) MG PO TABS
325.0000 mg | ORAL_TABLET | Freq: Every day | ORAL | Status: DC
  Administered 2017-07-30 – 2017-08-03 (×5): 325 mg via ORAL
  Filled 2017-07-29 (×5): qty 1

## 2017-07-29 MED ORDER — TRAZODONE HCL 50 MG PO TABS
100.0000 mg | ORAL_TABLET | Freq: Every day | ORAL | Status: DC
  Administered 2017-07-29 – 2017-08-02 (×5): 100 mg via ORAL
  Filled 2017-07-29 (×5): qty 2

## 2017-07-29 MED ORDER — OSELTAMIVIR PHOSPHATE 75 MG PO CAPS: 75.0000 mg | ORAL_CAPSULE | Freq: Two times a day (BID) | ORAL | Status: DC

## 2017-07-29 MED ORDER — VITAMIN B-12 1000 MCG PO TABS
500.0000 ug | ORAL_TABLET | Freq: Every day | ORAL | Status: DC
  Administered 2017-07-30 – 2017-08-03 (×5): 500 ug via ORAL
  Filled 2017-07-29 (×5): qty 1

## 2017-07-29 MED ORDER — ATORVASTATIN CALCIUM 20 MG PO TABS
40.0000 mg | ORAL_TABLET | Freq: Every day | ORAL | Status: DC
  Administered 2017-07-30 – 2017-08-03 (×5): 40 mg via ORAL
  Filled 2017-07-29 (×5): qty 2

## 2017-07-29 MED ORDER — EQL ONE DAILY WOMENS PO TABS: ORAL_TABLET | Freq: Every day | ORAL | Status: DC

## 2017-07-29 MED ORDER — ADULT MULTIVITAMIN W/MINERALS CH
1.0000 | ORAL_TABLET | Freq: Every day | ORAL | Status: DC
  Administered 2017-07-30 – 2017-08-03 (×5): 1 via ORAL
  Filled 2017-07-29 (×5): qty 1

## 2017-07-29 MED ORDER — BUPROPION HCL 75 MG PO TABS
75.0000 mg | ORAL_TABLET | Freq: Every morning | ORAL | Status: DC
  Administered 2017-07-30 – 2017-08-03 (×5): 75 mg via ORAL
  Filled 2017-07-29 (×5): qty 1

## 2017-07-29 MED ORDER — WARFARIN SODIUM 2.5 MG PO TABS: 2.5000 mg | ORAL_TABLET | Freq: Once | ORAL | Status: DC

## 2017-07-29 NOTE — Progress Notes (Addendum)
ANTICOAGULATION CONSULT NOTE - Initial Consult   Pharmacy Consult for Warfarin  Indication: mechanical heart valve  Allergies  Allergen Reactions  . Codeine Other (See Comments)  . Aspirin     unknown  . Penicillins     Has patient had a PCN reaction causing immediate rash, facial/tongue/throat swelling, SOB or lightheadedness with hypotension: NO Has patient had a PCN reaction causing severe rash involving mucus membranes or skin necrosis: NO Has patient had a PCN reaction that required hospitalization NO Has patient had a PCN reaction occurring within the last 10 years: NO If all of the above answers are "NO", then may proceed with Cephalosporin use.    Patient Measurements: Height: 5\' 6"  (167.6 cm) Weight: 158 lb (71.7 kg) IBW/kg (Calculated) : 59.3 Heparin Dosing Weight:   Vital Signs: Temp: 98.8 F (37.1 C) (03/19 1648) Temp Source: Oral (03/19 1648) BP: 132/50 (03/19 1739) Pulse Rate: 66 (03/19 1845)  Labs: Recent Labs    07/29/17 1755 07/29/17 1941  HGB 11.9*  --   HCT 36.4  --   PLT 305  --   LABPROT  --  47.7*  INR  --  5.23*  CREATININE 0.76  --   TROPONINI 0.11*  --     Estimated Creatinine Clearance: 59.8 mL/min (by C-G formula based on SCr of 0.76 mg/dL).   Medical History: Past Medical History:  Diagnosis Date  . Anxiety   . Carotid stenosis    a. Carotid US (9/14):  Bilat 1-39% => f/u PRN  . Dementia   . Depression   . Gastroesophageal reflux disease   . History of echocardiogram    Echo 2/17: mild LVH, EF 55-60%, mild to mod AS (mean 14 mmHg), mechanical MVR ok (mean 7 mmHgg), mild LAE, PASP 58 mmHg  . HLD (hyperlipidemia)   . HTN (hypertension)   . Mitral regurgitation    a. s/p Bjork Shiley mechanical mitral valve replacement in 1980; b. 08/2016 Echo: EF 60-65%, no rwma, mild AS, mod dil LA, no signif MR, PASP 60mmHg.  . 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 non-obstructive by cath 2007.  [name]. PAF (paroxysmal atrial  fibrillation) (HCC)    on chronic coumadin  . Paroxysmal atrial flutter (HCC)    s/p RFA x 2  . TIA (transient ischemic attack)   . Tobacco abuse     Medications:   (Not in a hospital admission)  Assessment: Pharmacy consulted to dose warfarin for heart valve repalcement in this 77 year old female.  Pt was on Warfarin 2.5 mg PO daily at home.  3/19 INR = 5.23 ,  Hgb = 11.9, Hct = 36.4   Goal of Therapy:  INR 2.5 - 3.5     Plan:  Will hold warfarin for now and recheck INR on 3/20 with AM labs.   Adelynn Gipe D 07/29/2017,9:13 PM

## 2017-07-29 NOTE — ED Notes (Signed)
Jenn RN, aware of bed assigned  

## 2017-07-29 NOTE — ED Triage Notes (Signed)
Pt brought in by son and daughter in law. PCP called and told family to bring pt to ED d/t possible pneumonia. No chest xray performed. Hypoxia in triage. Was on oxygen at PCP while getting checked out. 82% RA in triage. Pt states coughing earlier today.   Hx dementia. Appears very fatigued.

## 2017-07-29 NOTE — ED Notes (Signed)
Son and daughter in law [phone]

## 2017-07-29 NOTE — ED Notes (Signed)
First nurse note  Sent in by PCP for low o2 Sats  Decreased po intake and cough

## 2017-07-29 NOTE — ED Provider Notes (Signed)
[facility] Emergency Department Provider Note ____________________________________________   First MD Initiated Contact with Patient 07/29/17 1814     (approximate)  I have reviewed the triage vital signs and the nursing notes.   HISTORY  Chief Complaint Cough  Level 5 caveat: Unable to obtain HPI due to dementia  HPI [name] [name] is a 77 y.o. female with past medical history as noted below including dementia who presents with increased generalized weakness and fatigue over approximately the last week, associated with some cough as well as with dark urine.  Per her son, the patient stayed with him for the last week after the patient's husband was admitted to the hospital for a respiratory illness.  He noticed her change from baseline during this time.  Per the son, the patient's p.o. intake is also lower than usual, she has been sleeping a great deal, but he denies fever, vomiting, diarrhea, or shortness of breath.  She was noted to be hypoxic at her primary care doctor's office today and was sent to the emergency department.   Past Medical History:  Diagnosis Date  . Anxiety   . Carotid stenosis    a. Carotid US (9/14):  Bilat 1-39% => f/u PRN  . Dementia   . Depression   . Gastroesophageal reflux disease   . History of echocardiogram    Echo 2/17: mild LVH, EF 55-60%, mild to mod AS (mean 14 mmHg), mechanical MVR ok (mean 7 mmHgg), mild LAE, PASP 58 mmHg  . HLD (hyperlipidemia)   . HTN (hypertension)   . Mitral regurgitation    a. s/p Bjork Shiley mechanical mitral valve replacement in 1980; b. 08/2016 Echo: EF 60-65%, no rwma, mild AS, mod dil LA, no signif MR, PASP .  . 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 non-obstructive by cath 2007.  [name] PAF (paroxysmal atrial fibrillation) (HCC)    on chronic coumadin  . Paroxysmal atrial flutter (HCC)    s/p RFA x 2  . TIA (transient ischemic attack)   . Tobacco abuse     Patient  Active Problem List   Diagnosis Date Noted  . Valvular heart disease 06/13/2017  . 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 Coronary artery disease involving native coronary artery of native heart without angina pectoris 06/16/2015  . Aspiration pneumonia (HCC) 06/09/2015  . Atrial flutter (HCC) 06/07/2015  . Hypokalemia 06/07/2015  . Non-occlusive coronary artery disease 06/07/2015  . Acute upper gastrointestinal bleeding   . Erosive gastritis with hemorrhage   . 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 H/O prosthetic heart valve 03/27/2015  . Insomnia 03/27/2015  . 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 BP (high blood pressure) 03/27/2015  . H/O transient cerebral ischemia 03/27/2015  . Arthritis, degenerative 03/27/2015  . Closed fracture of hip (HCC) 03/27/2015  . Anxiety 03/27/2015  . Hyperlipemia 03/27/2015  . Depression 10/17/2014  . Dementia 05/06/2014  . Fracture of femoral neck, left (HCC) 05/06/2014  . Displaced fracture of left femoral neck (HCC) 05/05/2014  . Encounter for therapeutic drug monitoring 06/09/2013  . Carotid stenosis 04/08/2011  . Transient ischemic attack (TIA) 08/07/2010  . TOBACCO ABUSE 09/21/2008  . Essential hypertension, benign 09/21/2008  . PAF (paroxysmal atrial fibrillation) (HCC) 09/21/2008    Past Surgical History:  Procedure Laterality Date  . A FLUTTER ABLATION     x 2  . ESOPHAGOGASTRODUODENOSCOPY N/A 06/04/2015   Procedure: ESOPHAGOGASTRODUODENOSCOPY (EGD);  Surgeon: Iva Boop, MD;  Location[facility] ENDOSCOPY;  Service: Endoscopy;  Laterality: N/A;  . HIP ARTHROPLASTY Left 05/07/2014   Procedure: ARTHROPLASTY MONOPOLAR HIP;  Surgeon: Eldred Manges, MD;  Location: [facility] OR;  Service: Orthopedics;  Laterality: Left;  . MITRAL VALVE REPLACEMENT     Bjork-Shiley valve placed for mitral regurgitation    Prior to Admission medications   Medication Sig Start Date End Date Taking? Authorizing Provider  amiodarone (PACERONE) 200 MG  tablet Take 1 tablet (200 mg total) by mouth daily. 03/07/17  Yes Creig Hines, NP  Ascorbic Acid (VITAMIN C) 500 MG tablet Take 500 mg by mouth daily.     Yes [provider]  aspirin EC 81 MG EC tablet Take 81 mg by mouth daily.     Yes [provider]  atorvastatin (LIPITOR) 40 MG tablet TAKE 1 TABLET BY MOUTH ONCE A DAY 07/29/16  Yes Malva Limes, MD  buPROPion [facility]) 75 MG tablet TAKE 1 TABLET BY MOUTH EVERY MORNING 04/15/16  Yes Malva Limes, MD  Cyanocobalamin (VITAMIN B 12) 250 MCG LOZG Take 500 mg by mouth daily.     Yes [provider]  diphenhydramine-acetaminophen (TYLENOL PM EXTRA STRENGTH) 25-500 MG TABS tablet Take 1 tablet by mouth at bedtime.  01/07/11  Yes [provider]  escitalopram (LEXAPRO) 20 MG tablet TAKE 1 TABLET BY MOUTH ONCE DAILY 02/28/17  Yes Malva Limes, MD  ferrous sulfate 325 (65 FE) MG tablet Take 325 mg by mouth daily with breakfast.   Yes [provider]  lisinopril (PRINIVIL,ZESTRIL) 10 MG tablet TAKE 1 TABLET BY MOUTH ONCE DAILY 05/29/17  Yes End, Cristal Deer, MD  metoprolol tartrate (LOPRESSOR) 50 MG tablet Take 1 tablet (50 mg total) by mouth 2 (two) times daily. 02/05/17 07/29/17 Yes End, Cristal Deer, MD  Multiple Vitamins-Calcium (EQL ONE DAILY WOMENS PO) Take 1 tablet by mouth daily.     Yes [provider]  pantoprazole (PROTONIX) 40 MG tablet TAKE 1 TABLET BY MOUTH DAILY BEFORE BREAKFAST 07/03/17  Yes Malva Limes, MD  temazepam (RESTORIL) 15 MG capsule TAKE 1 CAPSULE BY MOUTH EVERY NIGHT AT BEDTIME AS NEEDED 07/07/17  Yes Malva Limes, MD  traZODone (DESYREL) 100 MG tablet TAKE 1 TABLET BY MOUTH AT BEDTIME 07/03/17  Yes Malva Limes, MD  warfarin (COUMADIN) 2.5 MG tablet Take 2.5 mg by mouth daily.   Yes [provider]  ALPRAZolam (XANAX) 0.5 MG tablet TAKE 1/2 TO 1 TABLET BY MOUTH EVERY 6 HOURS AS NEEDED FOR ANXIETY 04/29/17   Malva Limes, MD    warfarin (COUMADIN) 5 MG tablet TAKE 1 TABLET BY MOUTH EVERY DAY EXCEPT ON TUESDAYS, THURSDAYS AND SUNDAYS TAKE 1/2 TABLET Patient not taking: Reported on 07/29/2017 12/31/16   Malva Limes, MD    Allergies Codeine; Aspirin; and Penicillins  Family History  Problem Relation Age of Onset  . Heart attack Other   . Hypertension Neg Hx   . Stroke Neg Hx     Social History Social History   Tobacco Use  . Smoking status: Current Some Day Smoker    Packs/day: 0.25    Years: 35.00    Pack years: 8.75    Types: Cigarettes    Last attempt to quit: 08/25/2015    Years since quitting: 1.9  . Smokeless tobacco: Former Engineer, water Use Topics  . Alcohol use: No  . Drug use: No    Review of Systems Level 5 caveat: Unable to obtain review of systems due to dementia  ____________________________________________   PHYSICAL EXAM:  VITAL SIGNS: ED Triage Vitals  Enc Vitals Group     BP 07/29/17 1739 (!) 132/50     Pulse Rate 07/29/17 1739 73     Resp 07/29/17 1739 16     Temp --      Temp src --      SpO2 07/29/17 1739 (!) 83 %     Weight 07/29/17 1740 158 lb (71.7 kg)     Height 07/29/17 1740 5\' 6"  (1.676 m)     Head Circumference --      Peak Flow --      Pain Score 07/29/17 1739 0     Pain Loc --      Pain Edu? --      Excl. in GC? --     Constitutional: Alert, but somewhat slow to respond. Eyes: Conjunctivae are normal.  EOMI.  PERRLA. Head: Atraumatic. Nose: No congestion/rhinnorhea. Mouth/Throat: Mucous membranes are dry.   Neck: Normal range of motion.  Cardiovascular: Normal rate, regular rhythm. Grossly normal heart sounds.  Good peripheral circulation. Respiratory: Normal respiratory effort.  No retractions.  Coarse, rhonchorous breath sounds bilaterally with no wheezes. Gastrointestinal: Soft and nontender. No distention.  Genitourinary: No flank tenderness. Musculoskeletal: No lower extremity edema.  Extremities warm and well perfused.   Neurologic: Motor intact in all extremities. Skin:  Skin is warm and dry. No rash noted. Psychiatric: Calm and cooperative.  ____________________________________________   LABS (all labs ordered are listed, but only abnormal results are displayed)  Labs Reviewed  BASIC METABOLIC PANEL - Abnormal; Notable for the following components:      Result Value   Potassium 3.0 (*)    Glucose, Bld 113 (*)    All other components within normal limits  CBC - Abnormal; Notable for the following components:   Hemoglobin 11.9 (*)    RDW 16.8 (*)    All other components within normal limits  TROPONIN I - Abnormal; Notable for the following components:   Troponin I 0.11 (*)    All other components within normal limits  URINALYSIS, COMPLETE (UACMP) WITH MICROSCOPIC - Abnormal; Notable for the following components:   Color, Urine AMBER (*)    APPearance CLOUDY (*)    Protein, ur 30 (*)    Leukocytes, UA MODERATE (*)    Bacteria, UA MANY (*)    Squamous Epithelial / LPF 0-5 (*)    All other components within normal limits  BRAIN NATRIURETIC PEPTIDE - Abnormal; Notable for the following components:   B Natriuretic Peptide 332.0 (*)    All other components within normal limits  PROTIME-INR - Abnormal; Notable for the following components:   Prothrombin Time 47.7 (*)    INR 5.23 (*)    All other components within normal limits  INFLUENZA PANEL BY PCR (TYPE A & B) - Abnormal; Notable for the following components:   Influenza A By PCR POSITIVE (*)    All other components within normal limits   ____________________________________________  EKG  ED ECG REPORT I, Dionne Bucy, the attending physician, personally viewed and interpreted this ECG.  Date: 07/29/2017 EKG Time: 1751 Rate: 72 Rhythm: normal sinus rhythm QRS Axis: normal Intervals: Nonspecific IVCD ST/T Wave abnormalities: LVH with repolarization abnormality Narrative Interpretation: Nonspecific findings; No evidence of  acute ischemia  ____________________________________________  RADIOLOGY  CXR: Cardiomegaly mild interstitial edema; no focal infiltrate  ____________________________________________   PROCEDURES  Procedure(s) performed: No  Procedures  Critical Care performed: No ____________________________________________  INITIAL IMPRESSION / ASSESSMENT AND PLAN / ED COURSE  Pertinent labs & imaging results that were available during my care of the patient were reviewed by me and considered in my medical decision making (see chart for details).  [age] year old female with past medical history as noted above presents with generalized weakness over the last week, increased somnolence, decreased p.o. intake, as well as hypoxia noted today at her doctor's office.  I reviewed the past medical records in Epic; she has had no recent ED visits or admissions within the last 2 years.  On exam, vital signs are normal except for hypoxia at triage to the mid 80s which corrected with nasal cannula.  The patient's neuro exam is nonfocal and there is no evidence of trauma.  She appears somewhat dehydrated and weak.  Differential is broad but includes pneumonia, bronchitis, URI, CHF or other cardiac cause, dehydration or other metabolic cause, or other source of infection such as UTI.  Plan for workup for these including labs, chest x-ray, and UA, fluid bolus, and reassess.  Anticipate likely admission due to hypoxia.   ----------------------------------------- 8:26 PM on 07/29/2017 -----------------------------------------  Patient's workup reveals no significant pulmonary infiltrate although there is possible mild interstitial edema.  Her BNP is not significantly elevated and I do not feel that her presentation is consistent with acute CHF.  Workup also reveals evidence of likely UTI, and the patient is flu positive.  Her troponin is somewhat elevated.  This is most consistent with demand ischemia related to  her infections.  EKG shows no acute findings.  Given her multiple infections, elevated troponin, and hypoxia, she requires admission.  I signed the patient out to the hospitalist Dr. Allena KatzPatel.  ____________________________________________   FINAL CLINICAL IMPRESSION(S) / ED DIAGNOSES  Final diagnoses:  Urinary tract infection without hematuria, site unspecified  Influenza A  Hypoxia      NEW MEDICATIONS STARTED DURING THIS VISIT:  New Prescriptions   No medications on file     Note:  This document was prepared using Dragon voice recognition software and may include unintentional dictation errors.    Dionne BucySiadecki, Henny Strauch, MD 07/29/17 2028

## 2017-07-29 NOTE — Progress Notes (Addendum)
Patient: [name] [name] Female    DOB: Jan 05, 1941   77 y.o.   MRN: [id number] Visit Date: 07/29/2017  Today's Provider: Mila Merry, MD   Chief Complaint  Patient presents with  . Follow-up   Subjective:    HPI  Alzheimer's dementia without behavioral disturbance, unspecified timing of dementia onset Patient is brought in by her son and daughter inlaw today reporting that patient has been deteriorating steadily over the last month, become more somnolent, not eating or drinking and increasingly confused. The have noticed even more rapid decline since last weekend. She was seen on 2/25 with strong smelling urine and was treated for presumed UTI with ciprofloxacin since she was unable to provide a urine sample. We also reduced temazepam at that time.  She has developed a mild persistent cough over the last few days, but no other uri sx and no fever.    Allergies  Allergen Reactions  . Codeine Other (See Comments)  . Aspirin     unknown  . Penicillins     Has patient had a PCN reaction causing immediate rash, facial/tongue/throat swelling, SOB or lightheadedness with hypotension: NO Has patient had a PCN reaction causing severe rash involving mucus membranes or skin necrosis: NO Has patient had a PCN reaction that required hospitalization NO Has patient had a PCN reaction occurring within the last 10 years: NO If all of the above answers are "NO", then may proceed with Cephalosporin use.     Current Outpatient Medications:  .  ALPRAZolam (XANAX) 0.5 MG tablet, TAKE 1/2 TO 1 TABLET BY MOUTH EVERY 6 HOURS AS NEEDED FOR ANXIETY, Disp: 60 tablet, Rfl: 4 .  amiodarone (PACERONE) 200 MG tablet, Take 1 tablet (200 mg total) by mouth daily., Disp: 90 tablet, Rfl: 3 .  Ascorbic Acid (VITAMIN C) 500 MG tablet, Take 500 mg by mouth daily.  , Disp: , Rfl:  .  aspirin EC 81 MG EC tablet, Take 81 mg by mouth daily.  , Disp: , Rfl:  .  atorvastatin (LIPITOR) 40 MG tablet, TAKE 1  TABLET BY MOUTH ONCE A DAY, Disp: 90 tablet, Rfl: 4 .  buPROPion (WELLBUTRIN) 75 MG tablet, TAKE 1 TABLET BY MOUTH EVERY MORNING, Disp: 90 tablet, Rfl: 4 .  Cyanocobalamin (VITAMIN B 12) 250 MCG LOZG, Take 500 mg by mouth daily.  , Disp: , Rfl:  .  diphenhydramine-acetaminophen (TYLENOL PM EXTRA STRENGTH) 25-500 MG TABS tablet, Take 1 tablet by mouth at bedtime. , Disp: , Rfl:  .  escitalopram (LEXAPRO) 20 MG tablet, TAKE 1 TABLET BY MOUTH ONCE DAILY, Disp: 90 tablet, Rfl: 4 .  ferrous sulfate 325 (65 FE) MG tablet, Take 325 mg by mouth daily with breakfast., Disp: , Rfl:  .  lisinopril (PRINIVIL,ZESTRIL) 10 MG tablet, TAKE 1 TABLET BY MOUTH ONCE DAILY, Disp: 30 tablet, Rfl: 2 .  Multiple Vitamins-Calcium (EQL ONE DAILY WOMENS PO), Take 1 tablet by mouth daily.  , Disp: , Rfl:  .  pantoprazole (PROTONIX) 40 MG tablet, TAKE 1 TABLET BY MOUTH DAILY BEFORE BREAKFAST, Disp: 90 tablet, Rfl: 4 .  temazepam (RESTORIL) 15 MG capsule, TAKE 1 CAPSULE BY MOUTH EVERY NIGHT AT BEDTIME AS NEEDED, Disp: 30 capsule, Rfl: 5 .  traZODone (DESYREL) 100 MG tablet, TAKE 1 TABLET BY MOUTH AT BEDTIME, Disp: 30 tablet, Rfl: 12 .  warfarin (COUMADIN) 5 MG tablet, TAKE 1 TABLET BY MOUTH EVERY DAY EXCEPT ON TUESDAYS, THURSDAYS AND SUNDAYS TAKE 1/2 TABLET (  Patient taking differently: TAKE 1 TABLET BY MOUTH EVERY DAY EXCEPT SUNDAYS TAKE 1/2 TABLET), Disp: 30 tablet, Rfl: 4 .  metoprolol tartrate (LOPRESSOR) 50 MG tablet, Take 1 tablet (50 mg total) by mouth 2 (two) times daily., Disp: 180 tablet, Rfl: 3  Review of Systems  Constitutional: Negative for appetite change, chills, fatigue and fever.  Respiratory: Negative for chest tightness and shortness of breath.   Cardiovascular: Negative for chest pain and palpitations.  Gastrointestinal: Negative for abdominal pain, nausea and vomiting.  Neurological: Negative for dizziness and weakness.    Social History   Tobacco Use  . Smoking status: Current Some Day Smoker     Packs/day: 0.25    Years: 35.00    Pack years: 8.75    Types: Cigarettes    Last attempt to quit: 08/25/2015    Years since quitting: 1.9  . Smokeless tobacco: Former Engineer, waterUser  Substance Use Topics  . Alcohol use: No   Objective:   BP (!) 110/54 (BP Location: Right Arm, Patient Position: Sitting, Cuff Size: Normal)   Pulse 74   Temp 98.8 F (37.1 C) (Oral)   Resp 16   SpO2 (!) 82% (room air)  94% (2lpm O2 [place]) Vitals:   07/29/17 1648  BP: (!) 110/54  Pulse: 74  Resp: 16  Temp: 98.8 F (37.1 C)  TempSrc: Oral  SpO2: (!) 82%   BP Readings from Last 3 Encounters:  07/29/17 (!) 110/54  07/16/17 (!) 102/58  07/07/17 136/74     Physical Exam   General Appearance:    Sitting in chair somnolent. Briefly acknowledges question but unable to answer. In no distress.   Eyes:    PERRL, conjunctiva/corneas clear, EOM's intact       Lungs:     Shallow inspirations, faint rales on left. No wheezes or rhonchi.   Heart:    Regular rate and rhythm  Neurologic:   Awake, alert, oriented x 3. No apparent focal neurological           defect.           Assessment & Plan:     1. Hypoxemia She has no underlying pulmonary disease but does have history of aspiration pneumonia. Sent directly to ER for suspected pneumonia. Advised ER triage nurse. Patient placed on supplement oxygen 2lpm at check-in and oximetry improved to 94%.   2. Alzheimer's dementia without behavioral disturbance, unspecified timing of dementia onset Getting progressively worse even before onset of current respiratory symptoms. Patient husband has been hospitalized with arrhythmia and unable to care for patient. She will likely need placement in rehab or memory care unit when she has recovered from acute illness.         Mila Merryonald Rien Marland, MD  [facility]

## 2017-07-29 NOTE — H&P (Signed)
[facility] - [place] at [facility]   PATIENT NAME: [name] [name]    MR#:  [id number]  DATE OF BIRTH:  11/03/1940  DATE OF ADMISSION:  07/29/2017  PRIMARY CARE PHYSICIAN: Malva LimesFisher, Donald E, MD   REQUESTING/REFERRING PHYSICIAN: Dr. Marisa SeverinSiadecki  CHIEF COMPLAINT:  Generalized weakness fatigue poor p.o. intake not her usual self with lethargy Patient is sent from Dr. Theodis AguasFisher's office given acute illness  History is obtained from patient's daughter-in-law in the ER.  Patient is quite lethargic unable to give any history.  She is advanced dementia.  HISTORY OF PRESENT ILLNESS:  [name] [name]  is a 77 y.o. female with a known history of chronic anxiety, depression, dementia, history of mitral regurgitation with mechanical mitral valve in 1980 on chronic Coumadin comes to the emergency room from Dr. Theodis AguasFisher's office after patient was seen therefore poor appetite not feeling well generalized weakness lethargic not her usual self and overall weak according to daughter-in-law. She was found to be hypoxic in the upper 80s at PCPs office. In the ER she was found to be sats in the 84-85 on room air.  She was also found to be flu positive. Per family she has not eaten for last several days.  Her oral mucosa is dry and she appears quite dehydrated. She is being admitted with acute hypoxia without underlying lung condition along with influenza acute renal failure with clinical dehydration and acute encephalopathy.  PAST MEDICAL HISTORY:   Past Medical History:  Diagnosis Date  . Anxiety   . Carotid stenosis    a. Carotid US (9/14):  Bilat 1-39% => f/u PRN  . Dementia   . Depression   . Gastroesophageal reflux disease   . History of echocardiogram    Echo 2/17: mild LVH, EF 55-60%, mild to mod AS (mean 14 mmHg), mechanical MVR ok (mean 7 mmHgg), mild LAE, PASP 58 mmHg  . HLD (hyperlipidemia)   . HTN (hypertension)   . Mitral regurgitation    a. s/p Bjork Shiley  mechanical mitral valve replacement in 1980; b. 08/2016 Echo: EF 60-65%, no rwma, mild AS, mod dil LA, no signif MR, PASP 60mmHg.  . 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 non-obstructive by cath 2007.  [name]. PAF (paroxysmal atrial fibrillation) (HCC)    on chronic coumadin  . Paroxysmal atrial flutter (HCC)    s/p RFA x 2  . TIA (transient ischemic attack)   . Tobacco abuse     PAST SURGICAL HISTOIRY:   Past Surgical History:  Procedure Laterality Date  . A FLUTTER ABLATION     x 2  . ESOPHAGOGASTRODUODENOSCOPY N/A 06/04/2015   Procedure: ESOPHAGOGASTRODUODENOSCOPY (EGD);  Surgeon: Iva Booparl E Gessner, MD;  [facility];  Service: Endoscopy;  Laterality: N/A;  . HIP ARTHROPLASTY Left 05/07/2014   Procedure: ARTHROPLASTY MONOPOLAR HIP;  Surgeon: Eldred MangesMark C Yates, MD;  Location: [facility] OR;  Service: Orthopedics;  Laterality: Left;  . MITRAL VALVE REPLACEMENT     Bjork-Shiley valve placed for mitral regurgitation    SOCIAL HISTORY:   Social History   Tobacco Use  . Smoking status: Current Some Day Smoker    Packs/day: 0.25    Years: 35.00    Pack years: 8.75    Types: Cigarettes    Last attempt to quit: 08/25/2015    Years since quitting: 1.9  . Smokeless tobacco: Former Engineer, waterUser  Substance Use Topics  . Alcohol use: No    FAMILY HISTORY:   Family History  Problem Relation Age of Onset  . Heart attack Other   . Hypertension Neg Hx   . Stroke Neg Hx     DRUG ALLERGIES:   Allergies  Allergen Reactions  . Codeine Other (See Comments)  . Aspirin     unknown  . Penicillins     Has patient had a PCN reaction causing immediate rash, facial/tongue/throat swelling, SOB or lightheadedness with hypotension: NO Has patient had a PCN reaction causing severe rash involving mucus membranes or skin necrosis: NO Has patient had a PCN reaction that required hospitalization NO Has patient had a PCN reaction occurring within the last 10 years: NO If all of the above answers are  "NO", then may proceed with Cephalosporin use.    REVIEW OF SYSTEMS:  Review of Systems  Unable to perform ROS: Dementia     MEDICATIONS AT HOME:   Prior to Admission medications   Medication Sig Start Date End Date Taking? Authorizing Provider  amiodarone (PACERONE) 200 MG tablet Take 1 tablet (200 mg total) by mouth daily. 03/07/17  Yes Creig Hines, NP  Ascorbic Acid (VITAMIN C) 500 MG tablet Take 500 mg by mouth daily.     Yes [provider]  aspirin EC 81 MG EC tablet Take 81 mg by mouth daily.     Yes [provider]  atorvastatin (LIPITOR) 40 MG tablet TAKE 1 TABLET BY MOUTH ONCE A DAY 07/29/16  Yes Malva Limes, MD  buPROPion [facility]) 75 MG tablet TAKE 1 TABLET BY MOUTH EVERY MORNING 04/15/16  Yes Malva Limes, MD  Cyanocobalamin (VITAMIN B 12) 250 MCG LOZG Take 500 mg by mouth daily.     Yes [provider]  diphenhydramine-acetaminophen (TYLENOL PM EXTRA STRENGTH) 25-500 MG TABS tablet Take 1 tablet by mouth at bedtime.  01/07/11  Yes [provider]  escitalopram (LEXAPRO) 20 MG tablet TAKE 1 TABLET BY MOUTH ONCE DAILY 02/28/17  Yes Malva Limes, MD  ferrous sulfate 325 (65 FE) MG tablet Take 325 mg by mouth daily with breakfast.   Yes [provider]  lisinopril (PRINIVIL,ZESTRIL) 10 MG tablet TAKE 1 TABLET BY MOUTH ONCE DAILY 05/29/17  Yes End, Cristal Deer, MD  metoprolol tartrate (LOPRESSOR) 50 MG tablet Take 1 tablet (50 mg total) by mouth 2 (two) times daily. 02/05/17 07/29/17 Yes End, Cristal Deer, MD  Multiple Vitamins-Calcium (EQL ONE DAILY WOMENS PO) Take 1 tablet by mouth daily.     Yes [provider]  pantoprazole (PROTONIX) 40 MG tablet TAKE 1 TABLET BY MOUTH DAILY BEFORE BREAKFAST 07/03/17  Yes Malva Limes, MD  temazepam (RESTORIL) 15 MG capsule TAKE 1 CAPSULE BY MOUTH EVERY NIGHT AT BEDTIME AS NEEDED 07/07/17  Yes Malva Limes, MD  traZODone (DESYREL) 100 MG tablet TAKE 1 TABLET BY  MOUTH AT BEDTIME 07/03/17  Yes Malva Limes, MD  warfarin (COUMADIN) 2.5 MG tablet Take 2.5 mg by mouth daily.   Yes [provider]  ALPRAZolam (XANAX) 0.5 MG tablet TAKE 1/2 TO 1 TABLET BY MOUTH EVERY 6 HOURS AS NEEDED FOR ANXIETY 04/29/17   Malva Limes, MD  warfarin (COUMADIN) 5 MG tablet TAKE 1 TABLET BY MOUTH EVERY DAY EXCEPT ON TUESDAYS, THURSDAYS AND SUNDAYS TAKE 1/2 TABLET Patient not taking: Reported on 07/29/2017 12/31/16   Malva Limes, MD      VITAL SIGNS:  Blood pressure (!) 132/50, pulse 66, resp. rate 20, height 5\' 6"  (1.676 m), weight 71.7 kg (158 lb), SpO2  98 %.  PHYSICAL EXAMINATION:  GENERAL:  77 y.o.-year-old patient lying in the bed with no acute distress.  EYES: Pupils equal, round, reactive to light and accommodation. No scleral icterus. Extraocular muscles intact.  HEENT: Head atraumatic, normocephalic. Oropharynx and nasopharynx dry.  Patient has chapped and crackly lips NECK:  Supple, no jugular venous distention. No thyroid enlargement, no tenderness.  LUNGS: Normal breath sounds bilaterally, no wheezing, rales,rhonchi or crepitation. No use of accessory muscles of respiration.  CARDIOVASCULAR: S1, S2 normal. No murmurs, rubs, or gallops.  ABDOMEN: Soft, nontender, nondistended. Bowel sounds present. No organomegaly or mass.  EXTREMITIES: No pedal edema, cyanosis, or clubbing.  NEUROLOGIC: Unable to assess.  Patient lethargic pSYCHIATRIC: Lethargic SKIN: No obvious rash, lesion, or ulcer.   LABORATORY PANEL:   CBC Recent Labs  Lab 07/29/17 1755  WBC 9.7  HGB 11.9*  HCT 36.4  PLT 305   ------------------------------------------------------------------------------------------------------------------  Chemistries  Recent Labs  Lab 07/29/17 1755  NA 142  K 3.0*  CL 103  CO2 29  GLUCOSE 113*  BUN 18  CREATININE 0.76  CALCIUM 9.0    ------------------------------------------------------------------------------------------------------------------  Cardiac Enzymes Recent Labs  Lab 07/29/17 1755  TROPONINI 0.11*   ------------------------------------------------------------------------------------------------------------------  RADIOLOGY:  Dg Chest 2 View  Result Date: 07/29/2017 CLINICAL DATA:  Cough, congestion, hypoxia.  Failure to thrive. EXAM: CHEST - 2 VIEW COMPARISON:  One-view chest x-ray 06/06/2015. FINDINGS: The heart is enlarged. Aortic valve replacement is noted. Atherosclerotic calcifications are present at the aortic arch. Mild edema is present. There is no significant airspace consolidation. No effusions are evident. The upper lung fields are clear. The visualized soft tissues and bony thorax are unremarkable. IMPRESSION: 1. Cardiomegaly and mild interstitial edema without focal airspace disease. Electronically Signed   By: Marin Roberts M.D.   On: 07/29/2017 18:15    EKG:    IMPRESSION AND PLAN:   Brynnleigh Mcelwee  is a 77 y.o. female with a known history of chronic anxiety, depression, dementia, history of mitral regurgitation with mechanical mitral valve in 1980 on chronic Coumadin comes to the emergency room from Dr. Theodis Aguas office after patient was seen therefore poor appetite not feeling well generalized weakness lethargic not her usual self and overall weak according to daughter-in-law. She was found to be hypoxic in the upper 80s at PCPs office.  1.  Acute encephalopathy     -Multifactorial with influenza A positive, dehydration clinical, UTI, poor p.o. intake decreased appetite for the last several days and underlying advanced dementia.  2.  Influenza A -Tamiflu for 5 days  3.  Dehydration clinical -IV fluids  4.  Supratherapeutic INR -Hold warfarin -Pharmacy dose when appropriate  5.  UTI -Patient is listed as allergy to penicillin.  There is no family at present to verify  what allergy patient has to penicillin.  She has dementia.  In the meantime I will start patient on Cipro IV 400 twice daily. -Patient does not have true allergy to penicillin changed to IV Rocephin -Follow-up urine cultures  6.  Advanced dementia with failure to thrive, poor p.o. intake, malaise fatigue, per family overall declining -We will consider palliative care consultation once he is available to make discussion  7.  DVT prophylaxis -On Coumadin with supratherapeutic INR  Overall appears to have a poor prognosis. Management social worker for discharge planning All the records are reviewed and case discussed with ED provider. Management plans discussed with the patient, family and they are in agreement.  CODE STATUS: Full  TOTAL TIME TAKING CARE OF THIS PATIENT: 50 minutes.    Enedina Finner M.D on 07/29/2017 at 8:53 PM  Between 7am to 6pm - Pager - [phone]  After 6pm go to [email or web address] - password [email or web address] [facility]  [facility]  Office  [phone]  CC: Primary care physician; Malva Limes, MD

## 2017-07-29 NOTE — ED Notes (Signed)
Pt taken to room 2 after being placed on 2 L nasal cannula in triage. 90% when first in room. 93% after a few minutes in room.

## 2017-07-29 NOTE — Progress Notes (Signed)
Family Meeting Note  Advance Directive: No  Today a meeting took place with the daughter-in-law  pt is unable to participate due WU:[id number]:Lacked capacity acute illness and dementia   The following were discussed:Patient's diagnosis: , Patient's progosis: Patient was brought in with not feeling well.  She was found to be hypoxic and had been generalized weakness fatigued and poor p.o. intake at home.  She was found to be flu positive.  She has advanced dementia and is slowly progressing with overall decline in condition.  Spoke with daughter-in-law who stays that her son has not spoken with her mom about CODE STATUS.  For now we will keep her full code.  CODE STATUS will need to be readdressed. Additional follow-up to be provided: Palliative care  Time spent during discussion: 16 minutes  Enedina FinnerSona Jailynn Lavalais, MD

## 2017-07-30 LAB — BASIC METABOLIC PANEL
ANION GAP: 8 (ref 5–15)
BUN: 13 mg/dL (ref 6–20)
CHLORIDE: 104 mmol/L (ref 101–111)
CO2: 28 mmol/L (ref 22–32)
Calcium: 8.7 mg/dL — ABNORMAL LOW (ref 8.9–10.3)
Creatinine, Ser: 0.68 mg/dL (ref 0.44–1.00)
GFR calc Af Amer: >60 mL/min (ref >60)
Glucose, Bld: 119 mg/dL — ABNORMAL HIGH (ref 65–99)
POTASSIUM: 2.8 mmol/L — AB (ref 3.5–5.1)
SODIUM: 140 mmol/L (ref 135–145)

## 2017-07-30 LAB — PROTIME-INR
INR: 6.02 — AB
Prothrombin Time: 53.2 seconds — ABNORMAL HIGH (ref 11.4–15.2)

## 2017-07-30 LAB — MAGNESIUM: MAGNESIUM: 1.7 mg/dL (ref 1.7–2.4)

## 2017-07-30 MED ORDER — CIPROFLOXACIN HCL 500 MG PO TABS: 500.0000 mg | ORAL_TABLET | Freq: Two times a day (BID) | ORAL | Status: DC

## 2017-07-30 MED ORDER — CEPHALEXIN 500 MG PO CAPS
500.0000 mg | ORAL_CAPSULE | Freq: Two times a day (BID) | ORAL | Status: DC
  Administered 2017-07-30 – 2017-08-03 (×8): 500 mg via ORAL
  Filled 2017-07-30 (×9): qty 1

## 2017-07-30 MED ORDER — POTASSIUM CHLORIDE 20 MEQ PO PACK
40.0000 meq | PACK | ORAL | Status: AC
  Administered 2017-07-30 (×2): 40 meq via ORAL
  Filled 2017-07-30 (×3): qty 2

## 2017-07-30 MED ORDER — HALOPERIDOL 1 MG PO TABS
1.0000 mg | ORAL_TABLET | Freq: Four times a day (QID) | ORAL | Status: DC | PRN
  Administered 2017-07-30 – 2017-08-01 (×3): 1 mg via ORAL
  Filled 2017-07-30 (×5): qty 1

## 2017-07-30 MED ORDER — MAGNESIUM OXIDE 400 (241.3 MG) MG PO TABS
400.0000 mg | ORAL_TABLET | Freq: Two times a day (BID) | ORAL | Status: DC
  Administered 2017-07-30 (×2): 400 mg via ORAL
  Filled 2017-07-30 (×2): qty 1

## 2017-07-30 NOTE — Progress Notes (Signed)
[facility] - [place] at [facility]   PATIENT NAME: [name] [name]    MR#:  [id number]  DATE OF BIRTH:  12-Feb-1941  SUBJECTIVE:   Confused, more awake Ate a little bit REVIEW OF SYSTEMS:   Review of Systems  Unable to perform ROS: Dementia   Tolerating Diet: Tolerating PT:   DRUG ALLERGIES:   Allergies  Allergen Reactions  . Codeine Other (See Comments)  . Aspirin     unknown  . Penicillins     Has patient had a PCN reaction causing immediate rash, facial/tongue/throat swelling, SOB or lightheadedness with hypotension: NO Has patient had a PCN reaction causing severe rash involving mucus membranes or skin necrosis: NO Has patient had a PCN reaction that required hospitalization NO Has patient had a PCN reaction occurring within the last 10 years: NO If all of the above answers are "NO", then may proceed with Cephalosporin use.    VITALS:  Blood pressure (!) 155/49, pulse 76, temperature 98 F (36.7 C), temperature source Oral, resp. rate 18, height 5\' 6"  (1.676 m), weight 71.7 kg (158 lb), SpO2 92 %.  PHYSICAL EXAMINATION:   Physical Exam  GENERAL:  77 y.o.-year-old patient lying in the bed with no acute distress.  EYES: Pupils equal, round, reactive to light and accommodation. No scleral icterus. Extraocular muscles intact.  HEENT: Head atraumatic, normocephalic. Oropharynx and nasopharynx clear.  NECK:  Supple, no jugular venous distention. No thyroid enlargement, no tenderness.  LUNGS: Normal breath sounds bilaterally, no wheezing, rales, rhonchi. No use of accessory muscles of respiration.  CARDIOVASCULAR: S1, S2 normal. No murmurs, rubs, or gallops.  ABDOMEN: Soft, nontender, nondistended. Bowel sounds present. No organomegaly or mass.  EXTREMITIES: No cyanosis, clubbing or edema b/l.    NEUROLOGIC: moves all extremities well PSYCHIATRIC:  patient is alert and awake, confused SKIN: No obvious rash, lesion, or ulcer.   LABORATORY  PANEL:  CBC Recent Labs  Lab 07/29/17 1755  WBC 9.7  HGB 11.9*  HCT 36.4  PLT 305    Chemistries  Recent Labs  Lab 07/30/17 1344  NA 140  K 2.8*  CL 104  CO2 28  GLUCOSE 119*  BUN 13  CREATININE 0.68  CALCIUM 8.7*  MG 1.7   Cardiac Enzymes Recent Labs  Lab 07/29/17 1755  TROPONINI 0.11*   RADIOLOGY:  Dg Chest 2 View  Result Date: 07/29/2017 CLINICAL DATA:  Cough, congestion, hypoxia.  Failure to thrive. EXAM: CHEST - 2 VIEW COMPARISON:  One-view chest x-ray 06/06/2015. FINDINGS: The heart is enlarged. Aortic valve replacement is noted. Atherosclerotic calcifications are present at the aortic arch. Mild edema is present. There is no significant airspace consolidation. No effusions are evident. The upper lung fields are clear. The visualized soft tissues and bony thorax are unremarkable. IMPRESSION: 1. Cardiomegaly and mild interstitial edema without focal airspace disease. Electronically Signed   By: Marin Robertshristopher  Mattern M.D.   On: 07/29/2017 18:15   ASSESSMENT AND PLAN:  [name] [name]  is a 77 y.o. female with a known history of chronic anxiety, depression, dementia, history of mitral regurgitation with mechanical mitral valve in 1980 on chronic Coumadin comes to the emergency room from Dr. Theodis AguasFisher's office after patient was seen therefore poor appetite not feeling well generalized weakness lethargic not her usual self and overall weak according to daughter-in-law. She was found to be hypoxic in the upper 80s at PCPs office.  1.  Acute encephalopathy     -Multifactorial with influenza A positive, dehydration  clinical, UTI, poor p.o. intake decreased appetite for the last several days and underlying advanced dementia.  2.  Influenza A -Tamiflu for 5 days  3.  Dehydration clinical -IV fluids  4.  Supratherapeutic INR -Hold warfarin -Pharmacy dose when appropriate  5.  UTI -Patient is listed as allergy to penicillin.    Per pharmacy patient has tolerated  cephalosporins in the past.  Will change to Keflex for 5 days.  6.  Advanced dementia with failure to thrive, poor p.o. intake, malaise fatigue, per family overall declining -We will consider palliative care consultation   7.  DVT prophylaxis -On Coumadin with supratherapeutic INR  Overall appears to have a poor prognosis. Management social worker for discharge planning  Spoke with son Brett Canales the phone and they are looking for placement for mother given her advanced dementia.  Discussed with social worker to discuss with family regarding options for patient  Case discussed with Care Management/Social Worker. Management plans discussed with the patient, family and they are in agreement.  CODE STATUS: full  DVT Prophylaxis: Supratherapeutic INR  TOTAL TIME TAKING CARE OF THIS PATIENT: *30* minutes.  >50% time spent on counselling and coordination of care  POSSIBLE D/C IN 1-2 DAYS, DEPENDING ON CLINICAL CONDITION.  Note: This dictation was prepared with Dragon dictation along with smaller phrase technology. Any transcriptional errors that result from this process are unintentional.  Enedina Finner M.D on 07/30/2017 at 3:15 PM  Between 7am to 6pm - Pager - [phone]  After 6pm go to [email or web address] - password [email or web address]  [facility] [place] [facility]  Office  [phone]  CC: Primary care physician; Malva Limes, MDPatient ID: [name], female   DOB: Sep 15, 1940, 77 y.o.   MRN: [id number]

## 2017-07-30 NOTE — [place] FL2 (Signed)
[place] MEDICAID FL2 LEVEL OF CARE SCREENING TOOL     IDENTIFICATION  Patient Name: [name] [name] Birthdate: [birth year] Sex: female Admission Date (Current Location): 07/29/2017  [place] and [place] Number:  Chiropodist and Address:  [facility], [address] [place], [place] [address]      Provider Number: [id number]  Attending Physician Name and Address:  Enedina Finner, MD  Relative Name and Phone Number:  Carolee, [place] [phone]   or [place], [place] [phone]     Current Level of Care: Hospital Recommended Level of Care: Skilled Nursing Facility Prior Approval Number:    Date Approved/Denied:   PASRR Number: [id number]  Discharge Plan: SNF    Current Diagnoses: Patient Active Problem List   Diagnosis Date Noted  . Encephalopathy acute 07/29/2017  . Valvular heart disease 06/13/2017  . 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 Coronary artery disease involving native coronary artery of native heart without angina pectoris 06/16/2015  . Aspiration pneumonia (HCC) 06/09/2015  . Atrial flutter (HCC) 06/07/2015  . Hypokalemia 06/07/2015  . Non-occlusive coronary artery disease 06/07/2015  . Acute upper gastrointestinal bleeding   . Erosive gastritis with hemorrhage   . 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 H/O prosthetic heart valve 03/27/2015  . Insomnia 03/27/2015  . 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 BP (high blood pressure) 03/27/2015  . H/O transient cerebral ischemia 03/27/2015  . Arthritis, degenerative 03/27/2015  . Closed fracture of hip (HCC) 03/27/2015  . Anxiety 03/27/2015  . Hyperlipemia 03/27/2015  . Depression 10/17/2014  . Dementia 05/06/2014  . Fracture of femoral neck, left (HCC) 05/06/2014  . Displaced fracture of left femoral neck (HCC) 05/05/2014  . Encounter for therapeutic drug monitoring 06/09/2013  . Carotid stenosis 04/08/2011  . Transient  ischemic attack (TIA) 08/07/2010  . TOBACCO ABUSE 09/21/2008  . Essential hypertension, benign 09/21/2008  . PAF (paroxysmal atrial fibrillation) (HCC) 09/21/2008    Orientation RESPIRATION BLADDER Height & Weight     Self  Normal Incontinent Weight: 158 lb (71.7 kg) Height:  5\' 6"  (167.6 cm)  BEHAVIORAL SYMPTOMS/MOOD NEUROLOGICAL BOWEL NUTRITION STATUS      Continent Diet(Dysphagia 3 diet)  AMBULATORY STATUS COMMUNICATION OF NEEDS Skin   Limited Assist Verbally Normal                       Personal Care Assistance Level of Assistance  Bathing, Feeding, Dressing Bathing Assistance: Maximum assistance Feeding assistance: Limited assistance Dressing Assistance: Maximum assistance     Functional Limitations Info  Sight, Hearing, Speech Sight Info: Adequate Hearing Info: Adequate Speech Info: Adequate    SPECIAL CARE FACTORS FREQUENCY  PT (By licensed PT), Speech therapy     PT Frequency: No follow up, patient has dementia, and has a hard time participating.       Speech Therapy Frequency: 5x a week      Contractures Contractures Info: Not present    Additional Factors Info  Code Status, Allergies, Psychotropic, Isolation Precautions Code Status Info: Full Code Allergies Info: CODEINE, ASPIRIN, PENICILLINS  Psychotropic Info: buPROPion (WELLBUTRIN) tablet 75 mg and escitalopram (LEXAPRO) tablet 20 mg    Isolation Precautions Info: Droplet precautions     Current Medications (07/30/2017):  This is the current hospital active medication list Current Facility-Administered Medications  Medication Dose Route Frequency Provider Last Rate Last Dose  . acetaminophen (TYLENOL) tablet 650 mg  650 mg Oral Q6H PRN Enedina Finner, MD   650 mg  at 07/30/17 1637   Or  . acetaminophen (TYLENOL) suppository 650 mg  650 mg Rectal Q6H PRN Enedina FinnerPatel, Sona, MD      . albuterol (PROVENTIL) (2.5 MG/3ML) 0.083% nebulizer solution 2.5 mg  2.5 mg Nebulization Q6H PRN Enedina FinnerPatel, Sona, MD      .  amiodarone (PACERONE) tablet 200 mg  200 mg Oral Daily Enedina FinnerPatel, Sona, MD   200 mg at 07/30/17 [id number]  . atorvastatin (LIPITOR) tablet 40 mg  40 mg Oral Daily Enedina FinnerPatel, Sona, MD   40 mg at 07/30/17 [id number]  . buPROPion [facility](WELLBUTRIN) tablet 75 mg  75 mg Oral q morning - 10a Enedina FinnerPatel, Sona, MD   75 mg at 07/30/17 [id number]  . cephALEXin (KEFLEX) capsule 500 mg  500 mg Oral Q12H Enedina FinnerPatel, Sona, MD      . escitalopram (LEXAPRO) tablet 20 mg  20 mg Oral Daily Enedina FinnerPatel, Sona, MD   20 mg at 07/30/17 [id number]  . ferrous sulfate tablet 325 mg  325 mg Oral Q breakfast Enedina FinnerPatel, Sona, MD   325 mg at 07/30/17 0939  . haloperidol (HALDOL) tablet 1 mg  1 mg Oral Q6H PRN Enedina FinnerPatel, Sona, MD   1 mg at 07/30/17 1625  . magnesium oxide (MAG-OX) tablet 400 mg  400 mg Oral BID Enedina FinnerPatel, Sona, MD   400 mg at 07/30/17 1625  . multivitamin with minerals tablet 1 tablet  1 tablet Oral Daily Enedina FinnerPatel, Sona, MD   1 tablet at 07/30/17 [phone]  . oseltamivir (TAMIFLU) capsule 30 mg  30 mg Oral BID Enedina FinnerPatel, Sona, MD   30 mg at 07/30/17 [id number]  . pantoprazole (PROTONIX) EC tablet 40 mg  40 mg Oral Daily Enedina FinnerPatel, Sona, MD   40 mg at 07/30/17 [id number]  . polyethylene glycol (MIRALAX / GLYCOLAX) packet 17 g  17 g Oral Daily PRN Enedina FinnerPatel, Sona, MD      . potassium chloride (KLOR-CON) packet 40 mEq  40 mEq Oral Q4H Enedina FinnerPatel, Sona, MD   40 mEq at 07/30/17 1625  . traZODone (DESYREL) tablet 100 mg  100 mg Oral QHS Enedina FinnerPatel, Sona, MD   100 mg at 07/29/17 2323  . vitamin B-12 (CYANOCOBALAMIN) tablet 500 mcg  500 mcg Oral Daily Enedina FinnerPatel, Sona, MD   500 mcg at 07/30/17 [phone]  . vitamin C (ASCORBIC ACID) tablet 500 mg  500 mg Oral Daily Enedina FinnerPatel, Sona, MD   500 mg at 07/30/17 [id number]     Discharge Medications: Please see discharge summary for a list of discharge medications.  Relevant Imaging Results:  Relevant Lab Results:   Additional Information SSN [id number]  Darleene Cleavernterhaus, Judi Jaffe R, [place]

## 2017-07-30 NOTE — Progress Notes (Signed)
Pharmacy Electrolyte Monitoring Consult:  Pharmacy consulted to assist in monitoring and replacing electrolytes in this 77 y.o. female admitted on 07/29/2017 with Cough   Labs:  Sodium (mmol/L)  Date Value  07/30/2017 140  06/14/2016 137   Potassium (mmol/L)  Date Value  07/30/2017 2.8 (L)   Magnesium (mg/dL)  Date Value  09/81/191403/20/2019 1.7   Phosphorus (mg/dL)  Date Value  78/29/562111/05/2015 3.7   Calcium (mg/dL)  Date Value  30/86/578403/20/2019 8.7 (L)   Albumin (g/dL)  Date Value  69/62/952803/10/2017 3.6    Plan: KCl 40 meq po x 2 and mag ox 400 mg bid x 3. Will f/u AM labs.   Luisa HartChristy, Katia Hannen D 07/30/2017 3:12 PM

## 2017-07-30 NOTE — Evaluation (Signed)
Clinical/Bedside Swallow Evaluation Patient Details  Name: [name] [name] MRN: [id number] Date of Birth: 02-25-1941  Today's Date: 07/30/2017 Time: SLP Start Time (ACUTE ONLY): 0900 SLP Stop Time (ACUTE ONLY): 0950 SLP Time Calculation (min) (ACUTE ONLY): 50 min  Past Medical History:  Past Medical History:  Diagnosis Date  . Anxiety   . Carotid stenosis    a. Carotid US (9/14):  Bilat 1-39% => f/u PRN  . Dementia   . Depression   . Gastroesophageal reflux disease   . History of echocardiogram    Echo 2/17: mild LVH, EF 55-60%, mild to mod AS (mean 14 mmHg), mechanical MVR ok (mean 7 mmHgg), mild LAE, PASP 58 mmHg  . HLD (hyperlipidemia)   . HTN (hypertension)   . Mitral regurgitation    a. s/p Bjork Shiley mechanical mitral valve replacement in 1980; b. 08/2016 Echo: EF 60-65%, no rwma, mild AS, mod dil LA, no signif MR, PASP .  . 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 non-obstructive by cath 2007.  [name] PAF (paroxysmal atrial fibrillation) (HCC)    on chronic coumadin  . Paroxysmal atrial flutter (HCC)    s/p RFA x 2  . TIA (transient ischemic attack)   . Tobacco abuse    Past Surgical History:  Past Surgical History:  Procedure Laterality Date  . A FLUTTER ABLATION     x 2  . ESOPHAGOGASTRODUODENOSCOPY N/A 06/04/2015   Procedure: ESOPHAGOGASTRODUODENOSCOPY (EGD);  Surgeon: Iva Boop, MD;  Location: [facility] ENDOSCOPY;  Service: Endoscopy;  Laterality: N/A;  . HIP ARTHROPLASTY Left 05/07/2014   Procedure: ARTHROPLASTY MONOPOLAR HIP;  Surgeon: Eldred Manges, MD;  Location: [facility] OR;  Service: Orthopedics;  Laterality: Left;  . MITRAL VALVE REPLACEMENT     Bjork-Shiley valve placed for mitral regurgitation   HPI:  Pt is a 77 y.o. female with a known history of chronic anxiety, depression, advanced Dementia, history of mitral regurgitation with mechanical mitral valve in 1980 on chronic Coumadin comes to the emergency room from Dr. Theodis Aguas office after found to  have sats in 84-85 on room air.  She was also found to be flu positive. Per family, pt has had poor appetite not eating and not feeling well, generalized weakness lethargic not her usual self and overall weak according to daughter-in-law. She is being admitted with acute hypoxia without underlying lung condition along with influenza acute renal failure with clinical dehydration and acute encephalopathy. Per family, she has not eaten for last several days.  Her oral mucosa is dry and she appears quite dehydrated. Pt has been agitated per NSG.    Assessment / Plan / Recommendation Clinical Impression  Pt appears to present w/ adequate oropharyngeal phase swallow function w/ no gross s/s oral or pharyngeal phase deficits; no overt s/s of aspiration noted w/ oral intake. Pt consumed items sips of thin liquids via Cup then Straw and small tsps of puree then softened solids w/ no overt s/s of aspiration noted; no coughing or wet vocal quality noted and no decline in respiratory status noted during/post trials. Pt exhibited min slower oral phase time w/ the increased textured trials of mech soft foods but when moistened well and cut small, she masticated/mahsed boluses for swallowing/clearing. Oral clearing post trials was appropriate. Oral Motor movements appeared [facility] w/ no unilateral weakness noted or loss of bolus. Suspect pt's baseline declined Cognitive status/advanced Dementia could heavily impact her participation in oral intake, desire for oral intake. Recommend a Dysphagia level 3(Mech Soft)  diet w/ Thin liquids via Cup; general aspiration precautions; assistance at all meals - reduce distractions at meals. Recommend Pills in Puree - Crushed as needed for safer, easier swallowing. Recommend f/u w/ Dietician for nutritional supplements as warranted - Drink form may be best for pt d/t decreased attention at times/Cognitive decline. No further skilled ST services indicated at this time as pt appears to be able to  tolerate po's. NSG to reconsult if any decline in status while admitted.  SLP Visit Diagnosis: Dysphagia, unspecified (R13.10)    Aspiration Risk  (reduced following general aspiration precautions)    Diet Recommendation  Dysphagia 3 w/ well-chopped meats, Gravy added; Thin liquids. General aspiration precautions; assistance at meals for setup and encouragement  Medication Administration: Whole meds with puree(Crush in puree if necessary/able to safer, easier swallowing)    Other  Recommendations Recommended Consults: (Dietician f/u for supplements - drink form(?)) Oral Care Recommendations: Oral care BID;Staff/trained caregiver to provide oral care Other Recommendations: (n/a)   Follow up Recommendations None      Frequency and Duration (n/a)  (n/a)       Prognosis Prognosis for Safe Diet Advancement: Fair(-Good) Barriers to Reach Goals: Cognitive deficits;Severity of deficits      Swallow Study   General Date of Onset: 07/29/17 HPI: Pt is a 77 y.o. female with a known history of chronic anxiety, depression, advanced Dementia, history of mitral regurgitation with mechanical mitral valve in 1980 on chronic Coumadin comes to the emergency room from Dr. Theodis AguasFisher's office after found to have sats in 84-85 on room air.  She was also found to be flu positive. Per family, pt has had poor appetite not eating and not feeling well, generalized weakness lethargic not her usual self and overall weak according to daughter-in-law. She is being admitted with acute hypoxia without underlying lung condition along with influenza acute renal failure with clinical dehydration and acute encephalopathy. Per family, she has not eaten for last several days.  Her oral mucosa is dry and she appears quite dehydrated. Pt has been agitated per NSG.  Type of Study: Bedside Swallow Evaluation Previous Swallow Assessment: none reported Diet Prior to this Study: (regular per report) Temperature Spikes Noted: No(wbc  9.7) Respiratory Status: Nasal cannula(2 liters) History of Recent Intubation: No Behavior/Cognition: Alert;Cooperative;Pleasant mood;Confused;Distractible;Requires cueing Oral Cavity Assessment: Dry Oral Care Completed by SLP: Recent completion by staff Oral Cavity - Dentition: Poor condition;Missing dentition Vision: Functional for self-feeding Self-Feeding Abilities: Able to feed self;Needs assist;Needs set up;Total assist Patient Positioning: Upright in bed Baseline Vocal Quality: Normal(adequate) Volitional Cough: Cognitively unable to elicit Volitional Swallow: Unable to elicit    Oral/Motor/Sensory Function Overall Oral Motor/Sensory Function: (appeared grossly wfl w/ bolus management)   Ice Chips Ice chips: Within functional limits Presentation: Spoon(fed; 3 trials)   Thin Liquid Thin Liquid: Within functional limits Presentation: Cup;Self Fed;Straw(~4 ozs total)    Nectar Thick Nectar Thick Liquid: Not tested   Honey Thick Honey Thick Liquid: Not tested   Puree Puree: Within functional limits Presentation: Spoon;Self Fed(5-6 trials; then 5 self-fed trials)   Solid   GO   Solid: Impaired Presentation: Self Fed;Spoon(assisted; 5 trials) Oral Phase Impairments: Impaired mastication(slower bolus management and clearing) Oral Phase Functional Implications: Prolonged oral transit;Impaired mastication(min slower) Pharyngeal Phase Impairments: (none)        Jerilynn SomKatherine Nyjai Graff, MS, CCC-SLP Makaelyn Aponte 07/30/2017,4:30 PM

## 2017-07-30 NOTE — Clinical Social Work Note (Addendum)
CSW spoke with patient's son [name] [name], [phone] in regards to SNF placement.  Patient's son is interested in having patient go to SNF, and feel patient's husband can no longer continue to take care of patient.  CSW explained to patient's son the process and how if patient is not able to go to SNF under rehab, they will have to consider paying privately and applying for Medicaid.  Patient's son said they have started applying for Medicaid, and are trying to figure out what patient needs in order to get approved for long term care Medicaid.  CSW explained to them they need to speak with DSS to work on the process.  CSW was given permission to begin bed search in [place] and [place].  Formal assessment to follow.  CSW explained to patient's family if they can not pay for SNF then they will have to have patient return home with home health.  [name], MSW, Theresia MajorsLCSWA [phone]  07/30/2017 5:55 PM

## 2017-07-30 NOTE — Evaluation (Signed)
Physical Therapy Evaluation Patient Details Name: [name] [name] MRN: [id number] DOB: [birth year] Today's Date: 07/30/2017   History of Present Illness  Pt is a 77 year old female admitted from home with acute encephalopathy.  PMH includes TIA, paroxysmal atrial fibrillation, Htn, GERD and dementia.  Clinical Impression  Pt is a 77 year old female with advanced dementia who lives in a house with her husband.  Pt receives assistance at baseline and presents with confusion more severe than her current status, according to pt's brother in law.  Pt is in bed upon PT arrival and states that she is experiencing pain "all over".  Pt requires max A to sit at EOB and is unable to follow simple commands.  Due to cognitive status and difficulty in following directions, pt is not a candidate for PT at this time.    Follow Up Recommendations No PT follow up;Supervision/Assistance - 24 hour;Supervision for mobility/OOB    Equipment Recommendations       Recommendations for Other Services       Precautions / Restrictions Precautions Precautions: Fall Precaution Comments: High Fall Risk Restrictions Weight Bearing Restrictions: No      Mobility  Bed Mobility Overal bed mobility: Needs Assistance Bed Mobility: Supine to Sit     Supine to sit: Max assist     General bed mobility comments: Pt requires max A to scoot to EOB and has difficulty following directions.  Transfers Overall transfer level: (Did not perform due to safety concerns.)                  Ambulation/Gait Ambulation/Gait assistance: (Did not perform due to safety concerns.  Unable to assess.)              Stairs            Wheelchair Mobility    Modified Rankin (Stroke Patients Only)       Balance Overall balance assessment: Needs assistance Sitting-balance support: Feet supported;Bilateral upper extremity supported                                         Pertinent  Vitals/Pain Pain Assessment: Faces Faces Pain Scale: Hurts little more Pain Location: Pt states that she hurts "everywhere".    Home Living Family/patient expects to be discharged to:: (Pt unable to give hx.  Brother in Social workerlaw entered room at end of evaluation and provided some information.) Living Arrangements: Spouse/significant other               Additional Comments: Pt lives with husband who is in CCU currently, according to pt's brother in Social workerlaw.    Prior Function Level of Independence: Needs assistance      ADL's / Homemaking Assistance Needed: Per pt's brother in law, pt has severe dementia and is normally more confused than she is currently.        Hand Dominance        Extremity/Trunk Assessment   Upper Extremity Assessment Upper Extremity Assessment: (Pt unable to participate in MMT due to confusion.  Cannot follow directions.)            Communication      Cognition Arousal/Alertness: Lethargic Behavior During Therapy: Restless Overall Cognitive Status: History of cognitive impairments - at baseline  General Comments: Pt is not oriented to self, location or time.  Speaks in brief sentences concerning topics not relevent to current conversation.      General Comments      Exercises     Assessment/Plan    PT Assessment Patent does not need any further PT services  PT Problem List         PT Treatment Interventions      PT Goals (Current goals can be found in the Care Plan section)  Acute Rehab PT Goals Patient Stated Goal: Pt unable to participate in goal setting. PT Goal Formulation: Patient unable to participate in goal setting    Frequency     Barriers to discharge        Co-evaluation               AM-PAC PT "6 Clicks" Daily Activity  Outcome Measure Difficulty turning over in bed (including adjusting bedclothes, sheets and blankets)?: A Lot Difficulty moving from lying on back  to sitting on the side of the bed? : A Lot Difficulty sitting down on and standing up from a chair with arms (e.g., wheelchair, bedside commode, etc,.)?: A Lot Help needed moving to and from a bed to chair (including a wheelchair)?: A Lot Help needed walking in hospital room?: A Lot Help needed climbing 3-5 steps with a railing? : A Lot 6 Click Score: 12    End of Session   Activity Tolerance: (Pt limited by pt reported pain and level of confusion.) Patient left: in bed;with call bell/phone within reach;with bed alarm set;with family/visitor present   PT Visit Diagnosis: Muscle weakness (generalized) (M62.81)    Time: 1610-9604 PT Time Calculation (min) (ACUTE ONLY): 10 min   Charges:   PT Evaluation $PT Eval Moderate Complexity: 1 Mod     PT G Codes:   PT G-Codes **NOT FOR INPATIENT CLASS** Functional Assessment Tool Used: AM-PAC 6 Clicks Basic Mobility    Glenetta Hew, PT, DPT   Glenetta Hew 07/30/2017, 1:37 PM

## 2017-07-30 NOTE — Care Management (Signed)
Patient's husband is currently in icu.  he had reported to CM prior to transfer from 2A to icu that his sone was attempting to find placement for patient. She presented to ED from PCP office with 02 sats in the low 80's.  PT and SLP consults.  Patient with advanced dementia

## 2017-07-30 NOTE — Progress Notes (Signed)
ANTICOAGULATION CONSULT NOTE - Initial Consult   Pharmacy Consult for Warfarin  Indication: mechanical heart valve  Allergies  Allergen Reactions  . Codeine Other (See Comments)  . Aspirin     unknown  . Penicillins     Has patient had a PCN reaction causing immediate rash, facial/tongue/throat swelling, SOB or lightheadedness with hypotension: NO Has patient had a PCN reaction causing severe rash involving mucus membranes or skin necrosis: NO Has patient had a PCN reaction that required hospitalization NO Has patient had a PCN reaction occurring within the last 10 years: NO If all of the above answers are "NO", then may proceed with Cephalosporin use.    Patient Measurements: Height: 5\' 6"  (167.6 cm) Weight: 158 lb (71.7 kg) IBW/kg (Calculated) : 59.3 Heparin Dosing Weight:   Vital Signs: Temp: 98 F (36.7 C) (03/20 0422) Temp Source: Oral (03/20 0422) BP: 155/49 (03/20 0937) Pulse Rate: 76 (03/20 0937)  Labs: Recent Labs    07/29/17 1755 07/29/17 1941 07/30/17 0608  HGB 11.9*  --   --   HCT 36.4  --   --   PLT 305  --   --   LABPROT  --  47.7* 53.2*  INR  --  5.23* 6.02*  CREATININE 0.76  --   --   TROPONINI 0.11*  --   --     Estimated Creatinine Clearance: 59.8 mL/min (by C-G formula based on SCr of 0.76 mg/dL).   Medical History: Past Medical History:  Diagnosis Date  . Anxiety   . Carotid stenosis    a. Carotid US (9/14):  Bilat 1-39% => f/u PRN  . Dementia   . Depression   . Gastroesophageal reflux disease   . History of echocardiogram    Echo 2/17: mild LVH, EF 55-60%, mild to mod AS (mean 14 mmHg), mechanical MVR ok (mean 7 mmHgg), mild LAE, PASP 58 mmHg  . HLD (hyperlipidemia)   . HTN (hypertension)   . Mitral regurgitation    a. s/p Bjork Shiley mechanical mitral valve replacement in 1980; b. 08/2016 Echo: EF 60-65%, no rwma, mild AS, mod dil LA, no signif MR, PASP 60mmHg.  . 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 non-obstructive  by cath 2007.  [name]. PAF (paroxysmal atrial fibrillation) (HCC)    on chronic coumadin  . Paroxysmal atrial flutter (HCC)    s/p RFA x 2  . TIA (transient ischemic attack)   . Tobacco abuse     Medications:  Medications Prior to Admission  Medication Sig Dispense Refill Last Dose  . amiodarone (PACERONE) 200 MG tablet Take 1 tablet (200 mg total) by mouth daily. 90 tablet 3 07/29/2017 at AM  . Ascorbic Acid (VITAMIN C) 500 MG tablet Take 500 mg by mouth daily.     07/29/2017 at AM  . aspirin EC 81 MG EC tablet Take 81 mg by mouth daily.     07/29/2017 at AM  . atorvastatin (LIPITOR) 40 MG tablet TAKE 1 TABLET BY MOUTH ONCE A DAY 90 tablet 4 07/28/2017 at PM  . buPROPion (WELLBUTRIN) 75 MG tablet TAKE 1 TABLET BY MOUTH EVERY MORNING 90 tablet 4 07/29/2017 at AM  . Cyanocobalamin (VITAMIN B 12) 250 MCG LOZG Take 500 mg by mouth daily.     07/29/2017 at AM  . diphenhydramine-acetaminophen (TYLENOL PM EXTRA STRENGTH) 25-500 MG TABS tablet Take 1 tablet by mouth at bedtime.    07/28/2017 at PM  . escitalopram (LEXAPRO) 20 MG tablet TAKE 1  TABLET BY MOUTH ONCE DAILY 90 tablet 4 07/29/2017 at AM  . ferrous sulfate 325 (65 FE) MG tablet Take 325 mg by mouth daily with breakfast.   07/29/2017 at AM  . lisinopril (PRINIVIL,ZESTRIL) 10 MG tablet TAKE 1 TABLET BY MOUTH ONCE DAILY 30 tablet 2 07/29/2017 at AM  . metoprolol tartrate (LOPRESSOR) 50 MG tablet Take 1 tablet (50 mg total) by mouth 2 (two) times daily. 180 tablet 3 07/29/2017 at AM  . Multiple Vitamins-Calcium (EQL ONE DAILY WOMENS PO) Take 1 tablet by mouth daily.     07/29/2017 at AM  . pantoprazole (PROTONIX) 40 MG tablet TAKE 1 TABLET BY MOUTH DAILY BEFORE BREAKFAST 90 tablet 4 07/29/2017 at AM  . temazepam (RESTORIL) 15 MG capsule TAKE 1 CAPSULE BY MOUTH EVERY NIGHT AT BEDTIME AS NEEDED 30 capsule 5 PRN at PRN  . traZODone (DESYREL) 100 MG tablet TAKE 1 TABLET BY MOUTH AT BEDTIME 30 tablet 12 07/28/2017 at PM  . warfarin (COUMADIN) 2.5 MG tablet Take 2.5  mg by mouth daily.   07/28/2017 at PM  . ALPRAZolam (XANAX) 0.5 MG tablet TAKE 1/2 TO 1 TABLET BY MOUTH EVERY 6 HOURS AS NEEDED FOR ANXIETY 60 tablet 4 PRN at PRN  . warfarin (COUMADIN) 5 MG tablet TAKE 1 TABLET BY MOUTH EVERY DAY EXCEPT ON TUESDAYS, THURSDAYS AND SUNDAYS TAKE 1/2 TABLET (Patient not taking: Reported on 07/29/2017) 30 tablet 4 Not Taking at --    Assessment: Pharmacy consulted to dose warfarin for heart valve repalcement in this 77 year old female.  Pt was on Warfarin 2.5 mg PO daily at home.  3/19 INR = 5.23 ,  Hgb = 11.9, Hct = 36.4   Goal of Therapy:  INR 2.5 - 3.5     Plan:  Will hold warfarin for now and recheck INR on 3/21 with AM labs.   Luisa Hart D 07/30/2017,1:22 PM

## 2017-07-30 NOTE — Progress Notes (Signed)
Patient has yelled out for most of the day.  Made Dr. Allena KatzPatel aware that patient's yelling is increasing as the day goes on.  New orders received.  Orson Apeanielle Erleen Egner, RN

## 2017-07-30 NOTE — Progress Notes (Signed)
CRITICAL VALUE ALERT  Critical Value:  INR 6.02 Date & Time Notied:  07/30/2017 08:30  Provider Notified: Enedina FinnerSona Patel, MD Orders Received/Actions taken: No new orders, will continue to monitor Orson Apeanielle Earlin Sweeden, RN

## 2017-07-31 LAB — BASIC METABOLIC PANEL
Anion gap: 6 (ref 5–15)
BUN: 9 mg/dL (ref 6–20)
CALCIUM: 8.5 mg/dL — AB (ref 8.9–10.3)
CO2: 27 mmol/L (ref 22–32)
Chloride: 108 mmol/L (ref 101–111)
Creatinine, Ser: 0.51 mg/dL (ref 0.44–1.00)
GFR calc Af Amer: >60 mL/min (ref >60)
GLUCOSE: 99 mg/dL (ref 65–99)
POTASSIUM: 3.7 mmol/L (ref 3.5–5.1)
Sodium: 141 mmol/L (ref 135–145)

## 2017-07-31 LAB — MAGNESIUM: Magnesium: 1.7 mg/dL (ref 1.7–2.4)

## 2017-07-31 MED ORDER — MAGNESIUM OXIDE 400 (241.3 MG) MG PO TABS
400.0000 mg | ORAL_TABLET | Freq: Two times a day (BID) | ORAL | Status: AC
  Administered 2017-07-31 (×2): 400 mg via ORAL
  Filled 2017-07-31 (×2): qty 1

## 2017-07-31 MED ORDER — METOPROLOL TARTRATE 50 MG PO TABS
50.0000 mg | ORAL_TABLET | Freq: Two times a day (BID) | ORAL | Status: DC
  Administered 2017-07-31 – 2017-08-03 (×7): 50 mg via ORAL
  Filled 2017-07-31 (×7): qty 1

## 2017-07-31 NOTE — Care Management (Signed)
Notified by UR that patient requires code [id number]. Insurance company denied inpatient stay.  Informed patient's son and daughter in law over the phone and will provide the notice 3.22.2019

## 2017-07-31 NOTE — Progress Notes (Signed)
[facility] [place] [facility]   PATIENT NAME: [name] [name]    MR#:  [id number]  DATE OF BIRTH:  02/18/1941  SUBJECTIVE:   Confused, more awake Ate a little bit remains afebrile REVIEW OF SYSTEMS:   Review of Systems  Unable to perform ROS: Dementia   Tolerating Diet: Tolerating PT:   DRUG ALLERGIES:   Allergies  Allergen Reactions  . Codeine Other (See Comments)  . Aspirin     unknown  . Penicillins     Has patient had a PCN reaction causing immediate rash, facial/tongue/throat swelling, SOB or lightheadedness with hypotension: NO Has patient had a PCN reaction causing severe rash involving mucus membranes or skin necrosis: NO Has patient had a PCN reaction that required hospitalization NO Has patient had a PCN reaction occurring within the last 10 years: NO If all of the above answers are "NO", then may proceed with Cephalosporin use.    VITALS:  Blood pressure 136/78, pulse 82, temperature 98.4 F (36.9 C), temperature source Oral, resp. rate 18, height 5\' 6"  (1.676 m), weight 71.7 kg (158 lb), SpO2 100 %.  PHYSICAL EXAMINATION:   Physical Exam  GENERAL:  77 y.o.-year-old patient lying in the bed with no acute distress.  EYES: Pupils equal, round, reactive to light and accommodation. No scleral icterus. Extraocular muscles intact.  HEENT: Head atraumatic, normocephalic. Oropharynx and nasopharynx clear.  NECK:  Supple, no jugular venous distention. No thyroid enlargement, no tenderness.  LUNGS: Normal breath sounds bilaterally, no wheezing, rales, rhonchi. No use of accessory muscles of respiration.  CARDIOVASCULAR: S1, S2 normal. No murmurs, rubs, or gallops.  ABDOMEN: Soft, nontender, nondistended. Bowel sounds present. No organomegaly or mass.  EXTREMITIES: No cyanosis, clubbing or edema b/l.    NEUROLOGIC: moves all extremities well PSYCHIATRIC:  patient is alert and awake, confused SKIN: No obvious rash, lesion, or ulcer.    LABORATORY PANEL:  CBC Recent Labs  Lab 07/29/17 1755  WBC 9.7  HGB 11.9*  HCT 36.4  PLT 305    Chemistries  Recent Labs  Lab 07/31/17 0332  NA 141  K 3.7  CL 108  CO2 27  GLUCOSE 99  BUN 9  CREATININE 0.51  CALCIUM 8.5*  MG 1.7   Cardiac Enzymes Recent Labs  Lab 07/29/17 1755  TROPONINI 0.11*   RADIOLOGY:  No results found. ASSESSMENT AND PLAN:  [name] [name]  is a 77 y.o. female with a known history of chronic anxiety, depression, dementia, history of mitral regurgitation with mechanical mitral valve in 1980 on chronic Coumadin comes to the emergency room from Dr. Theodis AguasFisher's office after patient was seen therefore poor appetite not feeling well generalized weakness lethargic not her usual self and overall weak according to daughter-in-law. She was found to be hypoxic in the upper 80s at PCPs office.  1.  Acute encephalopathy     -Multifactorial with influenza A positive, dehydration clinical, UTI, poor p.o. intake decreased appetite for the last several days and underlying advanced dementia.  2.  Influenza A -Tamiflu for 5 days  3.  Dehydration clinical -received  IV fluids  4.  Supratherapeutic INR -Hold warfarin -Pharmacy dose when appropriate  5.  UTI -Patient is listed as allergy to penicillin.    Per pharmacy patient has tolerated cephalosporins in the past.  Will change to Keflex for 5 days.  6.  Advanced dementia with failure to thrive, poor p.o. intake, malaise fatigue, per family overall declining -We will consider palliative care  consultation   7.  DVT prophylaxis -On Coumadin with supratherapeutic INR  Overall appears to have a poor prognosis. Management social worker for discharge planning  Spoke with son Brett Canales the phone and they are looking for placement for mother given her advanced dementia.  Discussed with social worker to discuss with family regarding options for patient  Case discussed with Care Management/Social  Worker. Management plans discussed with the patient, family and they are in agreement.  CODE STATUS: full  DVT Prophylaxis: Supratherapeutic INR  TOTAL TIME TAKING CARE OF THIS PATIENT: *30* minutes.  >50% time spent on counselling and coordination of care  POSSIBLE D/C IN 1-2 DAYS, DEPENDING ON CLINICAL CONDITION.  Note: This dictation was prepared with Dragon dictation along with smaller phrase technology. Any transcriptional errors that result from this process are unintentional.  Enedina Finner M.D on 07/31/2017 at 6:11 PM  Between 7am to 6pm - Pager - [phone]  After 6pm go to [email or web address] - password [employer]  [facility]  Office  [phone]  CC: Primary care physician; Malva Limes, MDPatient ID: [name] [name], female   DOB: [birth year], 77 y.o.   MRN: [id number]

## 2017-07-31 NOTE — Care Management Important Message (Signed)
Important Message  Patient Details  Name: Jonni SangerCecilia R Broccoli MRN: [id number] Date of Birth: 1940-12-13   Medicare Important Message Given:  Yes  Signed IM notice given  Eber HongGreene, Sakiya Stepka R, RN 07/31/2017, 12:01 PM

## 2017-07-31 NOTE — Progress Notes (Signed)
Pharmacy Electrolyte Monitoring Consult:  Pharmacy consulted to assist in monitoring and replacing electrolytes in this 77 y.o. female admitted on 07/29/2017 with Cough   Labs:  Sodium (mmol/L)  Date Value  07/31/2017 141  06/14/2016 137   Potassium (mmol/L)  Date Value  07/31/2017 3.7   Magnesium (mg/dL)  Date Value  40/98/119103/21/2019 1.7   Phosphorus (mg/dL)  Date Value  47/82/956211/05/2015 3.7   Calcium (mg/dL)  Date Value  13/08/657803/21/2019 8.5 (L)   Albumin (g/dL)  Date Value  46/96/295203/10/2017 3.6    Plan: Mg is on the lower end of normal at 1.7. Patient still has MagOX 400mg  x 1 dose ordered this morning. Will add and additional dose to be given tonight.  All other Electrolytes WNL.  Will recheck electrolytes with AM labs and continue to replace as needed.   Gardner CandleSheema M Gerda Yin, PharmD, BCPS Clinical Pharmacist 07/31/2017 7:37 AM

## 2017-07-31 NOTE — Care Management Obs Status (Signed)
MEDICARE OBSERVATION STATUS NOTIFICATION   Patient Details  Name: [name] [name] MRN: [id number] Date of Birth: [birth year]   Medicare Observation Status Notification Given:  Yes    Eber HongGreene, Jalen Oberry R, RN 07/31/2017, 6:04 PM

## 2017-07-31 NOTE — Clinical Social Work Maternal (Addendum)
CSW spoke to patient's husband and her family to discuss SNF placement options.  CSW explained to the family that she may not be approved for insurance company to cover patient's stay at SNF due to recommendation by PT and her advanced Alzheimer's disease.  CSW provided bed offers to patients family and explained to them they would have to pay privately for SNF placement.  CSW was given permission for [facility] and [facility] to speak to them in regards to paying for SNF.  CSW provided son's contact information for SNF.  CSW to continue to follow patient's progress throughout discharge planning.  CSW will submit to insurance to see if patient will be approved.  Ervin KnackEric R. Christion Leonhard, MSW, Theresia MajorsLCSWA [phone]  07/31/2017 12:52 PM

## 2017-07-31 NOTE — Progress Notes (Signed)
ANTICOAGULATION CONSULT NOTE - Initial Consult   Pharmacy Consult for Warfarin  Indication: mechanical heart valve  Allergies  Allergen Reactions  . Codeine Other (See Comments)  . Aspirin     unknown  . Penicillins     Has patient had a PCN reaction causing immediate rash, facial/tongue/throat swelling, SOB or lightheadedness with hypotension: NO Has patient had a PCN reaction causing severe rash involving mucus membranes or skin necrosis: NO Has patient had a PCN reaction that required hospitalization NO Has patient had a PCN reaction occurring within the last 10 years: NO If all of the above answers are "NO", then may proceed with Cephalosporin use.    Patient Measurements: Height: 5\' 6"  (167.6 cm) Weight: 158 lb (71.7 kg) IBW/kg (Calculated) : 59.3 Heparin Dosing Weight:   Vital Signs: Temp: 98 F (36.7 C) (03/21 0336) Temp Source: Oral (03/21 0336) BP: 151/60 (03/21 0336) Pulse Rate: 95 (03/21 0336)  Labs: Recent Labs    07/29/17 1755 07/29/17 1941 07/30/17 0608 07/30/17 1344 07/31/17 0332  HGB 11.9*  --   --   --   --   HCT 36.4  --   --   --   --   PLT 305  --   --   --   --   LABPROT  --  47.7* 53.2*  --   --   INR  --  5.23* 6.02*  --   --   CREATININE 0.76  --   --  0.68 0.51  TROPONINI 0.11*  --   --   --   --     Estimated Creatinine Clearance: 59.8 mL/min (by C-G formula based on SCr of 0.51 mg/dL).   Medical History: Past Medical History:  Diagnosis Date  . Anxiety   . Carotid stenosis    a. Carotid US (9/14):  Bilat 1-39% => f/u PRN  . Dementia   . Depression   . Gastroesophageal reflux disease   . History of echocardiogram    Echo 2/17: mild LVH, EF 55-60%, mild to mod AS (mean 14 mmHg), mechanical MVR ok (mean 7 mmHgg), mild LAE, PASP 58 mmHg  . HLD (hyperlipidemia)   . HTN (hypertension)   . Mitral regurgitation    a. s/p Bjork Shiley mechanical mitral valve replacement in 1980; b. 08/2016 Echo: EF 60-65%, no rwma, mild AS, mod dil LA,  no signif MR, PASP .  . 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 non-obstructive by cath 2007.  [name] PAF (paroxysmal atrial fibrillation) (HCC)    on chronic coumadin  . Paroxysmal atrial flutter (HCC)    s/p RFA x 2  . TIA (transient ischemic attack)   . Tobacco abuse     Medications:  Medications Prior to Admission  Medication Sig Dispense Refill Last Dose  . amiodarone (PACERONE) 200 MG tablet Take 1 tablet (200 mg total) by mouth daily. 90 tablet 3 07/29/2017 at AM  . Ascorbic Acid (VITAMIN C) 500 MG tablet Take 500 mg by mouth daily.     07/29/2017 at AM  . aspirin EC 81 MG EC tablet Take 81 mg by mouth daily.     07/29/2017 at AM  . atorvastatin (LIPITOR) 40 MG tablet TAKE 1 TABLET BY MOUTH ONCE A DAY 90 tablet 4 07/28/2017 at PM  . buPROPion (WELLBUTRIN) 75 MG tablet TAKE 1 TABLET BY MOUTH EVERY MORNING 90 tablet 4 07/29/2017 at AM  . Cyanocobalamin (VITAMIN B 12) 250 MCG LOZG Take 500 mg by  mouth daily.     07/29/2017 at AM  . diphenhydramine-acetaminophen (TYLENOL PM EXTRA STRENGTH) 25-500 MG TABS tablet Take 1 tablet by mouth at bedtime.    07/28/2017 at PM  . escitalopram (LEXAPRO) 20 MG tablet TAKE 1 TABLET BY MOUTH ONCE DAILY 90 tablet 4 07/29/2017 at AM  . ferrous sulfate 325 (65 FE) MG tablet Take 325 mg by mouth daily with breakfast.   07/29/2017 at AM  . lisinopril (PRINIVIL,ZESTRIL) 10 MG tablet TAKE 1 TABLET BY MOUTH ONCE DAILY 30 tablet 2 07/29/2017 at AM  . metoprolol tartrate (LOPRESSOR) 50 MG tablet Take 1 tablet (50 mg total) by mouth 2 (two) times daily. 180 tablet 3 07/29/2017 at AM  . Multiple Vitamins-Calcium (EQL ONE DAILY WOMENS PO) Take 1 tablet by mouth daily.     07/29/2017 at AM  . pantoprazole (PROTONIX) 40 MG tablet TAKE 1 TABLET BY MOUTH DAILY BEFORE BREAKFAST 90 tablet 4 07/29/2017 at AM  . temazepam (RESTORIL) 15 MG capsule TAKE 1 CAPSULE BY MOUTH EVERY NIGHT AT BEDTIME AS NEEDED 30 capsule 5 PRN at PRN  . traZODone (DESYREL) 100 MG tablet TAKE 1  TABLET BY MOUTH AT BEDTIME 30 tablet 12 07/28/2017 at PM  . warfarin (COUMADIN) 2.5 MG tablet Take 2.5 mg by mouth daily.   07/28/2017 at PM  . ALPRAZolam (XANAX) 0.5 MG tablet TAKE 1/2 TO 1 TABLET BY MOUTH EVERY 6 HOURS AS NEEDED FOR ANXIETY 60 tablet 4 PRN at PRN  . warfarin (COUMADIN) 5 MG tablet TAKE 1 TABLET BY MOUTH EVERY DAY EXCEPT ON TUESDAYS, THURSDAYS AND SUNDAYS TAKE 1/2 TABLET (Patient not taking: Reported on 07/29/2017) 30 tablet 4 Not Taking at --    Assessment: Pharmacy consulted to dose warfarin for heart valve repalcement in this 77 year old female.  Pt was on Warfarin 2.5 mg PO daily at home.  On admission 3/19 INR = 5.23 ,  Hgb = 11.9, Hct = 36.4   DATE INR DOSE 3/19 5.23 HELD 3/20 6.02 HELD  Goal of Therapy:  INR 2.5 - 3.5   Plan:  Will hold warfarin for now and recheck INR on 3/22 with AM labs.   Gardner CandleSheema M Maliik Karner, PharmD, BCPS Clinical Pharmacist 07/31/2017 7:27 AM

## 2017-07-31 NOTE — Care Management CC44 (Signed)
Condition Code 44 Documentation Completed  Patient Details  Name: [name] [name] MRN: [id number] Date of Birth: 03/27/1941   Condition Code 44 given:  Yes Patient signature on Condition Code 44 notice:  Yes Documentation of 2 MD's agreement:  Yes Code 44 added to claim:  Yes    Eber HongGreene, Emalina Dubreuil R, RN 07/31/2017, 6:04 PM

## 2017-08-01 LAB — CBC
HCT: 35 % (ref 35.0–47.0)
Hemoglobin: 11.3 g/dL — ABNORMAL LOW (ref 12.0–16.0)
MCH: 27.4 pg (ref 26.0–34.0)
MCHC: 32.4 g/dL (ref 32.0–36.0)
MCV: 84.6 fL (ref 80.0–100.0)
PLATELETS: 418 10*3/uL (ref 150–440)
RBC: 4.14 MIL/uL (ref 3.80–5.20)
RDW: 16.4 % — AB (ref 11.5–14.5)
WBC: 6.6 10*3/uL (ref 3.6–11.0)

## 2017-08-01 LAB — BASIC METABOLIC PANEL
Anion gap: 8 (ref 5–15)
BUN: 9 mg/dL (ref 6–20)
CALCIUM: 8.5 mg/dL — AB (ref 8.9–10.3)
CO2: 25 mmol/L (ref 22–32)
Chloride: 108 mmol/L (ref 101–111)
Creatinine, Ser: 0.57 mg/dL (ref 0.44–1.00)
GFR calc Af Amer: >60 mL/min (ref >60)
GLUCOSE: 99 mg/dL (ref 65–99)
Potassium: 3.5 mmol/L (ref 3.5–5.1)
Sodium: 141 mmol/L (ref 135–145)

## 2017-08-01 LAB — PROTIME-INR
INR: 5.19
Prothrombin Time: 47.4 seconds — ABNORMAL HIGH (ref 11.4–15.2)

## 2017-08-01 LAB — MAGNESIUM: Magnesium: 1.8 mg/dL (ref 1.7–2.4)

## 2017-08-01 MED ORDER — POTASSIUM CHLORIDE 20 MEQ PO PACK
20.0000 meq | PACK | Freq: Once | ORAL | Status: AC
  Administered 2017-08-01: 20 meq via ORAL
  Filled 2017-08-01: qty 1

## 2017-08-01 MED ORDER — OSELTAMIVIR PHOSPHATE 30 MG PO CAPS: 30.0000 mg | ORAL_CAPSULE | Freq: Two times a day (BID) | ORAL | 0 refills | Status: DC

## 2017-08-01 MED ORDER — CEPHALEXIN 500 MG PO CAPS: 500.0000 mg | ORAL_CAPSULE | Freq: Two times a day (BID) | ORAL | 0 refills | Status: DC

## 2017-08-01 MED ORDER — MAGNESIUM OXIDE 400 (241.3 MG) MG PO TABS
400.0000 mg | ORAL_TABLET | Freq: Two times a day (BID) | ORAL | Status: AC
  Administered 2017-08-01 (×2): 400 mg via ORAL
  Filled 2017-08-01 (×2): qty 1

## 2017-08-01 MED ORDER — BISACODYL 10 MG RE SUPP
10.0000 mg | Freq: Once | RECTAL | Status: DC
  Filled 2017-08-01: qty 1

## 2017-08-01 MED ORDER — HALOPERIDOL LACTATE 5 MG/ML IJ SOLN
2.0000 mg | Freq: Four times a day (QID) | INTRAMUSCULAR | Status: DC | PRN
  Administered 2017-08-01 – 2017-08-03 (×4): 2 mg via INTRAVENOUS
  Filled 2017-08-01 (×6): qty 1

## 2017-08-01 MED ORDER — DIPHENHYDRAMINE HCL 50 MG/ML IJ SOLN
25.0000 mg | Freq: Once | INTRAMUSCULAR | Status: AC | PRN
  Administered 2017-08-02: 25 mg via INTRAVENOUS
  Filled 2017-08-01: qty 0.5

## 2017-08-01 MED ORDER — FLEET ENEMA 7-19 GM/118ML RE ENEM
1.0000 | ENEMA | Freq: Once | RECTAL | Status: AC
  Administered 2017-08-01: 1 via RECTAL

## 2017-08-01 NOTE — Plan of Care (Signed)
  Problem: Education: Goal: Knowledge of General Education information will improve Outcome: Progressing   Problem: Health Behavior/Discharge Planning: Goal: Ability to manage health-related needs will improve Outcome: Progressing   

## 2017-08-01 NOTE — Clinical Social Work Note (Signed)
CSW received phone call from patient's insurance company that they are not going to approved for short term rehab.  CSW contacted [facility] who have agreed to take patient on private pay.  [facility] said they can not accept patient until she has been on Tamiflu for 5 days.  Patient will be able to discharge on Sunday 3/24 to [facility] as long as she is medically ready for discharge and orders have been received.  CSW updated physician and patient's family.  Ervin KnackEric R. Mohsin Crum, MSW, Theresia MajorsLCSWA [phone]  08/01/2017 5:20 PM

## 2017-08-01 NOTE — Discharge Summary (Signed)
[facility] [place] [facility]   PATIENT NAME: [name] [name]    MR#:  [id number]  DATE OF BIRTH:  1940-05-28  DATE OF ADMISSION:  07/29/2017 ADMITTING PHYSICIAN: Enedina Finner, MD  DATE OF DISCHARGE: 08/01/2017  PRIMARY CARE PHYSICIAN: Malva Limes, MD    ADMISSION DIAGNOSIS:  Influenza A [J10.1] Hypoxia [R09.02] Urinary tract infection without hematuria, site unspecified [N39.0]  DISCHARGE DIAGNOSIS:  Acute encephalopathy due to UTI and Influenza A Advanced dementia Supratherapeutic INR (coumadin on hold for now--INR today 5.1) H/o mechanical valve--- needs to start coumadin once INR between 2.5-3.5 SECONDARY DIAGNOSIS:   Past Medical History:  Diagnosis Date  . Anxiety   . Carotid stenosis    a. Carotid US (9/14):  Bilat 1-39% => f/u PRN  . Dementia   . Depression   . Gastroesophageal reflux disease   . History of echocardiogram    Echo 2/17: mild LVH, EF 55-60%, mild to mod AS (mean 14 mmHg), mechanical MVR ok (mean 7 mmHgg), mild LAE, PASP 58 mmHg  . HLD (hyperlipidemia)   . HTN (hypertension)   . Mitral regurgitation    a. s/p Bjork Shiley mechanical mitral valve replacement in 1980; b. 08/2016 Echo: EF 60-65%, no rwma, mild AS, mod dil LA, no signif MR, PASP .  . 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 non-obstructive by cath 2007.  [name] PAF (paroxysmal atrial fibrillation) (HCC)    on chronic coumadin  . Paroxysmal atrial flutter (HCC)    s/p RFA x 2  . TIA (transient ischemic attack)   . Tobacco abuse     HOSPITAL COURSE:   CeciliaMurrellis a76 y.o.femalewith a known history of chronic anxiety, depression, dementia, history of mitral regurgitation with mechanical mitral valve in 1980 on chronic Coumadin comes to the emergency room from Dr. Theodis Aguas office after patient was seen therefore poor appetite not feeling well generalized weakness lethargic not her usual self and overall weak according to  daughter-in-law. She was found to be hypoxic in the upper 80s at PCPs office.  1. Acute encephalopathy  -Multifactorial with influenza A positive, dehydration clinical, UTI, poor p.o. intake decreased appetite for the last several days and underlying advanced dementia.  2. Influenza A -Tamiflu for 5 days  3. Dehydration clinical -received  IV fluids  4. Supratherapeutic INR . Pt takes coumadin for  Mechanical valve -Hold warfarin for now since INR 5.19 -Pharmacy dose when appropriate when INR is between 2.5-3.5 (pt takes 2.5 mg)  5. UTI -Patient is listed as allergy to penicillin.   Per pharmacy patient has tolerated cephalosporins in the past.  Will change to Keflex for 5 days.  6. Advanced dementia with failure to thrive,poor p.o. intake, malaise fatigue, per family overall declining  7. DVT prophylaxis -On Coumadin with supratherapeutic INR  Overall appears to have a poor prognosis. Management social worker for discharge planning--to SNF today    CONSULTS OBTAINED:    DRUG ALLERGIES:   Allergies  Allergen Reactions  . Codeine Other (See Comments)  . Aspirin     unknown  . Penicillins     Has patient had a PCN reaction causing immediate rash, facial/tongue/throat swelling, SOB or lightheadedness with hypotension: NO Has patient had a PCN reaction causing severe rash involving mucus membranes or skin necrosis: NO Has patient had a PCN reaction that required hospitalization NO Has patient had a PCN reaction occurring within the last 10 years: NO If all of the above answers are "NO",  then may proceed with Cephalosporin use.    DISCHARGE MEDICATIONS:   Allergies as of 08/01/2017      Reactions   Codeine Other (See Comments)   Aspirin    unknown   Penicillins    Has patient had a PCN reaction causing immediate rash, facial/tongue/throat swelling, SOB or lightheadedness with hypotension: NO Has patient had a PCN reaction causing severe rash  involving mucus membranes or skin necrosis: NO Has patient had a PCN reaction that required hospitalization NO Has patient had a PCN reaction occurring within the last 10 years: NO If all of the above answers are "NO", then may proceed with Cephalosporin use.      Medication List    STOP taking these medications   pantoprazole 40 MG tablet Commonly known as:  PROTONIX   temazepam 15 MG capsule Commonly known as:  RESTORIL   TYLENOL PM EXTRA STRENGTH 25-500 MG Tabs tablet Generic drug:  diphenhydramine-acetaminophen   warfarin 2.5 MG tablet Commonly known as:  COUMADIN   warfarin 5 MG tablet Commonly known as:  COUMADIN     TAKE these medications   ALPRAZolam 0.5 MG tablet Commonly known as:  XANAX TAKE 1/2 TO 1 TABLET BY MOUTH EVERY 6 HOURS AS NEEDED FOR ANXIETY   amiodarone 200 MG tablet Commonly known as:  PACERONE Take 1 tablet (200 mg total) by mouth daily.   aspirin EC 81 MG tablet Take 81 mg by mouth daily.   atorvastatin 40 MG tablet Commonly known as:  LIPITOR TAKE 1 TABLET BY MOUTH ONCE A DAY   buPROPion 75 MG tablet Commonly known as:  WELLBUTRIN TAKE 1 TABLET BY MOUTH EVERY MORNING   cephALEXin 500 MG capsule Commonly known as:  KEFLEX Take 1 capsule (500 mg total) by mouth every 12 (twelve) hours.   EQL ONE DAILY WOMENS PO Take 1 tablet by mouth daily.   escitalopram 20 MG tablet Commonly known as:  LEXAPRO TAKE 1 TABLET BY MOUTH ONCE DAILY   ferrous sulfate 325 (65 FE) MG tablet Take 325 mg by mouth daily with breakfast.   lisinopril 10 MG tablet Commonly known as:  PRINIVIL,ZESTRIL TAKE 1 TABLET BY MOUTH ONCE DAILY   metoprolol tartrate 50 MG tablet Commonly known as:  LOPRESSOR Take 1 tablet (50 mg total) by mouth 2 (two) times daily.   oseltamivir 30 MG capsule Commonly known as:  TAMIFLU Take 1 capsule (30 mg total) by mouth 2 (two) times daily.   traZODone 100 MG tablet Commonly known as:  DESYREL TAKE 1 TABLET BY MOUTH AT  BEDTIME   Vitamin B 12 250 MCG Lozg Take 500 mg by mouth daily.   vitamin C 500 MG tablet Commonly known as:  ASCORBIC ACID Take 500 mg by mouth daily.            Durable Medical Equipment  (From admission, onward)        Start     Ordered   07/30/17 1440  For home use only DME Walker rolling  Once    Question:  Patient needs a walker to treat with the following condition  Answer:  Weakness   07/30/17 1441      If you experience worsening of your admission symptoms, develop shortness of breath, life threatening emergency, suicidal or homicidal thoughts you must seek medical attention immediately by calling 911 or calling your MD immediately  if symptoms less severe.  You Must read complete instructions/literature along with all the possible adverse reactions/side effects  for all the Medicines you take and that have been prescribed to you. Take any new Medicines after you have completely understood and accept all the possible adverse reactions/side effects.   Please note  You were cared for by a hospitalist during your hospital stay. If you have any questions about your discharge medications or the care you received while you were in the hospital after you are discharged, you can call the unit and asked to speak with the hospitalist on call if the hospitalist that took care of you is not available. Once you are discharged, your primary care physician will handle any further medical issues. Please note that NO REFILLS for any discharge medications will be authorized once you are discharged, as it is imperative that you return to your primary care physician (or establish a relationship with a primary care physician if you do not have one) for your aftercare needs so that they can reassess your need for medications and monitor your lab values. Today   SUBJECTIVE   confused  VITAL SIGNS:  Blood pressure (!) 141/66, pulse 92, temperature 98.2 F (36.8 C), temperature source Oral,  resp. rate 18, height 5\' 6"  (1.676 m), weight 71.7 kg (158 lb), SpO2 92 %.  I/O:  No intake or output data in the 24 hours ending 08/01/17 0837  PHYSICAL EXAMINATION:  GENERAL:  77 y.o.-year-old patient lying in the bed with no acute distress.  EYES: Pupils equal, round, reactive to light and accommodation. No scleral icterus. Extraocular muscles intact.  HEENT: Head atraumatic, normocephalic. Oropharynx and nasopharynx clear.  NECK:  Supple, no jugular venous distention. No thyroid enlargement, no tenderness.  LUNGS: Normal breath sounds bilaterally, no wheezing, rales,rhonchi or crepitation. No use of accessory muscles of respiration.  CARDIOVASCULAR: S1, S2 normal. No murmurs, rubs, or gallops.  ABDOMEN: Soft, non-tender, non-distended. Bowel sounds present. No organomegaly or mass.  EXTREMITIES: No pedal edema, cyanosis, or clubbing.  NEUROLOGIC:non focal PSYCHIATRIC: The patient is alert and awake SKIN: No obvious rash, lesion, or ulcer.   DATA REVIEW:   CBC  Recent Labs  Lab 08/01/17 0535  WBC 6.6  HGB 11.3*  HCT 35.0  PLT 418    Chemistries  Recent Labs  Lab 08/01/17 0535  NA 141  K 3.5  CL 108  CO2 25  GLUCOSE 99  BUN 9  CREATININE 0.57  CALCIUM 8.5*  MG 1.8    Microbiology Results   No results found for this or any previous visit (from the past 240 hour(s)).  RADIOLOGY:  No results found.   Management plans discussed with the patient, family and they are in agreement.  CODE STATUS:     Code Status Orders  (From admission, onward)        Start     Ordered   07/29/17 2224  Full code  Continuous     07/29/17 2223    Code Status History    Date Active Date Inactive Code Status Order ID Comments User Context   06/03/2015 1132 06/09/2015 1719 Full Code [id number]  Denton Brick, MD ED   05/07/2014 2030 05/11/2014 1842 Full Code [id number]  Eldred Manges, MD Inpatient   05/06/2014 0202 05/07/2014 2030 Full Code [id number]  Gust Rung, DO  Inpatient      TOTAL TIME TAKING CARE OF THIS PATIENT: 40 minutes.    Enedina Finner M.D on 08/01/2017 at 8:37 AM  Between 7am to 6pm - Pager - [phone] After 6pm go to [email or web address] -  password Environmental education officer  [facility]  Office  [phone]  CC: Primary care physician; Malva Limes, MD

## 2017-08-01 NOTE — Progress Notes (Signed)
ANTICOAGULATION CONSULT NOTE - Initial Consult   Pharmacy Consult for Warfarin  Indication: mechanical heart valve  Allergies  Allergen Reactions  . Codeine Other (See Comments)  . Aspirin     unknown  . Penicillins     Has patient had a PCN reaction causing immediate rash, facial/tongue/throat swelling, SOB or lightheadedness with hypotension: NO Has patient had a PCN reaction causing severe rash involving mucus membranes or skin necrosis: NO Has patient had a PCN reaction that required hospitalization NO Has patient had a PCN reaction occurring within the last 10 years: NO If all of the above answers are "NO", then may proceed with Cephalosporin use.    Patient Measurements: Height: 5\' 6"  (167.6 cm) Weight: 158 lb (71.7 kg) IBW/kg (Calculated) : 59.3 Heparin Dosing Weight:   Vital Signs: Temp: 98.2 F (36.8 C) (03/22 0432) Temp Source: Oral (03/22 0432) BP: 141/66 (03/22 0432) Pulse Rate: 92 (03/22 0432)  Labs: Recent Labs    07/29/17 1755 07/29/17 1941 07/30/17 0608 07/30/17 1344 07/31/17 0332 08/01/17 0535  HGB 11.9*  --   --   --   --  11.3*  HCT 36.4  --   --   --   --  35.0  PLT 305  --   --   --   --  418  LABPROT  --  47.7* 53.2*  --   --  47.4*  INR  --  5.23* 6.02*  --   --  5.19*  CREATININE 0.76  --   --  0.68 0.51 0.57  TROPONINI 0.11*  --   --   --   --   --     Estimated Creatinine Clearance: 59.8 mL/min (by C-G formula based on SCr of 0.57 mg/dL).   Medical History: Past Medical History:  Diagnosis Date  . Anxiety   . Carotid stenosis    a. Carotid US (9/14):  Bilat 1-39% => f/u PRN  . Dementia   . Depression   . Gastroesophageal reflux disease   . History of echocardiogram    Echo 2/17: mild LVH, EF 55-60%, mild to mod AS (mean 14 mmHg), mechanical MVR ok (mean 7 mmHgg), mild LAE, PASP 58 mmHg  . HLD (hyperlipidemia)   . HTN (hypertension)   . Mitral regurgitation    a. s/p Bjork Shiley mechanical mitral valve replacement in 1980; b.  08/2016 Echo: EF 60-65%, no rwma, mild AS, mod dil LA, no signif MR, PASP 60mmHg.  . 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 non-obstructive by cath 2007.  [name]. PAF (paroxysmal atrial fibrillation) (HCC)    on chronic coumadin  . Paroxysmal atrial flutter (HCC)    s/p RFA x 2  . TIA (transient ischemic attack)   . Tobacco abuse     Medications:  Medications Prior to Admission  Medication Sig Dispense Refill Last Dose  . amiodarone (PACERONE) 200 MG tablet Take 1 tablet (200 mg total) by mouth daily. 90 tablet 3 07/29/2017 at AM  . Ascorbic Acid (VITAMIN C) 500 MG tablet Take 500 mg by mouth daily.     07/29/2017 at AM  . aspirin EC 81 MG EC tablet Take 81 mg by mouth daily.     07/29/2017 at AM  . atorvastatin (LIPITOR) 40 MG tablet TAKE 1 TABLET BY MOUTH ONCE A DAY 90 tablet 4 07/28/2017 at PM  . buPROPion (WELLBUTRIN) 75 MG tablet TAKE 1 TABLET BY MOUTH EVERY MORNING 90 tablet 4 07/29/2017 at AM  .  Cyanocobalamin (VITAMIN B 12) 250 MCG LOZG Take 500 mg by mouth daily.     07/29/2017 at AM  . diphenhydramine-acetaminophen (TYLENOL PM EXTRA STRENGTH) 25-500 MG TABS tablet Take 1 tablet by mouth at bedtime.    07/28/2017 at PM  . escitalopram (LEXAPRO) 20 MG tablet TAKE 1 TABLET BY MOUTH ONCE DAILY 90 tablet 4 07/29/2017 at AM  . ferrous sulfate 325 (65 FE) MG tablet Take 325 mg by mouth daily with breakfast.   07/29/2017 at AM  . lisinopril (PRINIVIL,ZESTRIL) 10 MG tablet TAKE 1 TABLET BY MOUTH ONCE DAILY 30 tablet 2 07/29/2017 at AM  . metoprolol tartrate (LOPRESSOR) 50 MG tablet Take 1 tablet (50 mg total) by mouth 2 (two) times daily. 180 tablet 3 07/29/2017 at AM  . Multiple Vitamins-Calcium (EQL ONE DAILY WOMENS PO) Take 1 tablet by mouth daily.     07/29/2017 at AM  . pantoprazole (PROTONIX) 40 MG tablet TAKE 1 TABLET BY MOUTH DAILY BEFORE BREAKFAST 90 tablet 4 07/29/2017 at AM  . temazepam (RESTORIL) 15 MG capsule TAKE 1 CAPSULE BY MOUTH EVERY NIGHT AT BEDTIME AS NEEDED 30 capsule 5 PRN at  PRN  . traZODone (DESYREL) 100 MG tablet TAKE 1 TABLET BY MOUTH AT BEDTIME 30 tablet 12 07/28/2017 at PM  . warfarin (COUMADIN) 2.5 MG tablet Take 2.5 mg by mouth daily.   07/28/2017 at PM  . ALPRAZolam (XANAX) 0.5 MG tablet TAKE 1/2 TO 1 TABLET BY MOUTH EVERY 6 HOURS AS NEEDED FOR ANXIETY 60 tablet 4 PRN at PRN  . warfarin (COUMADIN) 5 MG tablet TAKE 1 TABLET BY MOUTH EVERY DAY EXCEPT ON TUESDAYS, THURSDAYS AND SUNDAYS TAKE 1/2 TABLET (Patient not taking: Reported on 07/29/2017) 30 tablet 4 Not Taking at --    Assessment: Pharmacy consulted to dose warfarin for heart valve repalcement in this 77 year old female admitted with dehydration, influenza A and acute encephalopathy.  Pt was on Warfarin 2.5 mg PO daily at home.  On admission 3/19 INR = 5.23 ,  Hgb = 11.9, Hct = 36.4   DATE INR DOSE 3/20 5.23 HELD 3/21 6.02 HELD 3/22 5.23   Goal of Therapy:  INR 2.5 - 3.5   Plan:  INR starting to trend down. Will hold warfarin for now and recheck INR on 3/23 with AM labs. Plan to restart warfarin when INR closer to therapeutic range.   Gardner Candle, PharmD, BCPS Clinical Pharmacist 08/01/2017 7:24 AM

## 2017-08-01 NOTE — Progress Notes (Signed)
Pharmacy Electrolyte Monitoring Consult:  Pharmacy consulted to assist in monitoring and replacing electrolytes in this 77 y.o. female admitted on 07/29/2017 with Cough   Labs:  Sodium (mmol/L)  Date Value  08/01/2017 141  06/14/2016 137   Potassium (mmol/L)  Date Value  08/01/2017 3.5   Magnesium (mg/dL)  Date Value  16/10/960403/22/2019 1.8   Phosphorus (mg/dL)  Date Value  54/09/811911/05/2015 3.7   Calcium (mg/dL)  Date Value  14/78/295603/22/2019 8.5 (L)   Albumin (g/dL)  Date Value  21/30/865703/10/2017 3.6    Plan: Mg still at lower end of normal. Will supplement with  MagOX 400mg  x 2 doses. K+ is trending down. Will give KCL 20mEq x 1 dose today. All other Electrolytes WNL.   Will recheck electrolytes in 48 hours and continue to replace as needed.   Gardner CandleSheema M Lavora Brisbon, PharmD, BCPS Clinical Pharmacist 08/01/2017 7:23 AM

## 2017-08-01 NOTE — Clinical Social Work Note (Signed)
Clinical Social Work Assessment  Patient Details  Name: [name] [name] MRN: [id number] Date of Birth: 1940-08-24  Date of referral:  07/31/17               Reason for consult:  Facility Placement                Permission sought to share information with:  Facility Medical sales representativeContact Representative, Family Supports Permission granted to share information::  Yes, Verbal Permission Granted  Name::     [name] [name] Son [phone] or [name] [name] C Spouse [phone]   Agency::  SNF admissions   Relationship::     Contact Information:     Housing/Transportation Living arrangements for the past 2 months:  Single Family Home Source of Information:  Adult Children, Spouse Patient Interpreter Needed:  None Criminal Activity/Legal Involvement Pertinent to Current Situation/Hospitalization:  No - Comment as needed Significant Relationships:  Adult Children, Spouse Lives with:  Spouse Do you feel safe going back to the place where you live?  No Need for family participation in patient care:  Yes (Comment)  Care giving concerns:  Patient's family can not take care of patient anymore and they need Long term care SNF placement.   Social Worker assessment / plan:  Patient is a 77 year old female who is alert and oriented x1.  Patient has advanced dementia, assessment was completed by speaking with patient's son and husband.  According to patient's family she has not been able to transfer her self for some time now.  Patient's husband has been the primary caregiver for her, and he has his own medical issues.  Patient's husband and family would like her to go to SNF.  CSW discussed with the family that patient will most likely not be approved for SNF placement by insurance based on her advanced dementia, and not being able to participate in therapy.  Patient was admitted with the flu and is being treated with tamiflu.  Patient's family have agreed to pay privately, and will apply  for long term care medicaid.  Patient's family have chosen [facility].  Patient's family is aware that patient will have to be private pay.  Patient's family did not express any other questions or concerns and have agreed to go to [facility].   Employment status:  Retired Database administratornsurance information:  Managed Medicare PT Recommendations:  No Follow Up Information / Referral to community resources:  Skilled Nursing Facility  Patient/Family's Response to care:  Patient's family have agreed to have her go to SNF as a long term care resident.  Patient/Family's Understanding of and Emotional Response to Diagnosis, Current Treatment, and Prognosis: Patient is not aware of current treatment plan and prognosis, but family is aware.  Emotional Assessment Appearance:  Appears stated age Attitude/Demeanor/Rapport:    Affect (typically observed):  Appropriate, Stable Orientation:  Oriented to Self Alcohol / Substance use:  Not Applicable Psych involvement (Current and /or in the community):  No (Comment)  Discharge Needs  Concerns to be addressed:  Care Coordination, Lack of Support Readmission within the last 30 days:  No Current discharge risk:  Lack of support system, Cognitively Impaired Barriers to Discharge:  Other(Patient has to be on Tamilflu for 5 days before she can discharge to [facility].)   [place] [employer], Corrigan Kretschmer R, LCSWA 08/01/2017, 5:31 PM

## 2017-08-01 NOTE — Discharge Instructions (Addendum)
Dysphagia 3 w/ well-chopped meats, Gravy added; Thin liquids. General aspiration precautions; assistance at meals for setup and encouragement  Medication Administration: Whole meds with puree(Crush in puree if necessary/able to safer, easier swallowing)    Follow-up with primary care physician at the facility in 3-4 days Repeat PT/INR on 08/04/2017 and daily basis until INR is therapeutic between 2.5-3.5

## 2017-08-02 ENCOUNTER — Observation Stay: Payer: Medicare PPO

## 2017-08-02 LAB — PROTIME-INR
INR: 4.95 — AB
Prothrombin Time: 45.7 seconds — ABNORMAL HIGH (ref 11.4–15.2)

## 2017-08-02 MED ORDER — FUROSEMIDE 20 MG PO TABS: 20.0000 mg | ORAL_TABLET | Freq: Every day | ORAL | Status: DC

## 2017-08-02 MED ORDER — FUROSEMIDE 20 MG PO TABS
20.0000 mg | ORAL_TABLET | Freq: Every day | ORAL | Status: DC
  Administered 2017-08-03: 20 mg via ORAL
  Filled 2017-08-02: qty 1

## 2017-08-02 MED ORDER — FUROSEMIDE 10 MG/ML IJ SOLN
20.0000 mg | Freq: Once | INTRAMUSCULAR | Status: AC
  Administered 2017-08-02: 20 mg via INTRAVENOUS
  Filled 2017-08-02: qty 2

## 2017-08-02 NOTE — Plan of Care (Signed)
  Problem: Education: Goal: Knowledge of General Education information will improve Outcome: Progressing   Problem: Health Behavior/Discharge Planning: Goal: Ability to manage health-related needs will improve Outcome: Progressing   

## 2017-08-02 NOTE — Progress Notes (Signed)
[facility] [place] [facility]   PATIENT NAME: [name] [name]    MR#:  [id number]  DATE OF BIRTH:  1940-05-25  SUBJECTIVE:   Confused, more awake, calling out family members  REVIEW OF SYSTEMS:   Review of Systems  Unable to perform ROS: Dementia   Tolerating Diet: Tolerating PT:   DRUG ALLERGIES:   Allergies  Allergen Reactions  . Codeine Other (See Comments)  . Aspirin     unknown  . Penicillins     Has patient had a PCN reaction causing immediate rash, facial/tongue/throat swelling, SOB or lightheadedness with hypotension: NO Has patient had a PCN reaction causing severe rash involving mucus membranes or skin necrosis: NO Has patient had a PCN reaction that required hospitalization NO Has patient had a PCN reaction occurring within the last 10 years: NO If all of the above answers are "NO", then may proceed with Cephalosporin use.    VITALS:  Blood pressure (!) 150/66, pulse 95, temperature 98.6 F (37 C), temperature source Oral, resp. rate 16, height 5\' 6"  (1.676 m), weight 71.7 kg (158 lb), SpO2 94 %.  PHYSICAL EXAMINATION:   Physical Exam  GENERAL:  77 y.o.-year-old patient lying in the bed with no acute distress.  EYES: Pupils equal, round, reactive to light and accommodation. No scleral icterus. Extraocular muscles intact.  HEENT: Head atraumatic, normocephalic. Oropharynx and nasopharynx clear.  NECK:  Supple, no jugular venous distention. No thyroid enlargement, no tenderness.  LUNGS: Normal breath sounds bilaterally, no wheezing, rales, rhonchi. No use of accessory muscles of respiration.  CARDIOVASCULAR: S1, S2 normal. No murmurs, rubs, or gallops.  ABDOMEN: Soft, nontender, nondistended. Bowel sounds present. No organomegaly or mass.  EXTREMITIES: No cyanosis, clubbing or edema b/l.    NEUROLOGIC: moves all extremities well PSYCHIATRIC:  patient is alert and awake, confused SKIN: No obvious rash, lesion, or ulcer.    LABORATORY PANEL:  CBC Recent Labs  Lab 08/01/17 0535  WBC 6.6  HGB 11.3*  HCT 35.0  PLT 418    Chemistries  Recent Labs  Lab 08/01/17 0535  NA 141  K 3.5  CL 108  CO2 25  GLUCOSE 99  BUN 9  CREATININE 0.57  CALCIUM 8.5*  MG 1.8   Cardiac Enzymes Recent Labs  Lab 07/29/17 1755  TROPONINI 0.11*   RADIOLOGY:  Dg Chest Port 1 View  Result Date: 08/02/2017 CLINICAL DATA:  Short of breath EXAM: PORTABLE CHEST 1 VIEW COMPARISON:  07/29/2017 FINDINGS: The heart is mildly enlarged. Postop changes from mitral valve replacement are noted. There are Kerley B-lines and prominent interstitial markings at the lung bases compatible with mild interstitial edema. Vascularity is indistinct. No pneumothorax. IMPRESSION: Mild CHF and interstitial edema. Electronically Signed   By: Jolaine ClickArthur  Hoss M.D.   On: 08/02/2017 14:49   ASSESSMENT AND PLAN:  [name] [name]  is a 77 y.o. female with a known history of chronic anxiety, depression, dementia, history of mitral regurgitation with mechanical mitral valve in 1980 on chronic Coumadin comes to the emergency room from Dr. Theodis AguasFisher's office after patient was seen therefore poor appetite not feeling well generalized weakness lethargic not her usual self and overall weak according to daughter-in-law. She was found to be hypoxic in the upper 80s at PCPs office.  1.  Acute encephalopathy     -Multifactorial with influenza A positive, dehydration clinical, UTI, poor p.o. intake decreased appetite for the last several days and underlying advanced dementia.  2.  Influenza A -  Tamiflu for 5 days  3.  Mild pulm edema Lasix  D/ced IVF which were given for dehydration  4.  Supratherapeutic INR -Hold warfarin -Pharmacy dose when appropriate  5.  UTI -Patient is listed as allergy to penicillin.    Per pharmacy patient has tolerated cephalosporins in the past.  Will give  Keflex for 5 days.  6.  Advanced dementia with failure to thrive, poor  p.o. intake, malaise fatigue, per family overall declining -We will consider palliative care consultation at [facility]   7.  DVT prophylaxis -On Coumadin with supratherapeutic INR 4.95, No bleeding noticed  Overall appears to have a poor prognosis. Management social worker for discharge planning  Spoke with son Brett Canales the phone and they are looking for placement for mother given her advanced dementia.  Discussed with social worker to discuss with family regarding options for patient  Case discussed with Care Management/Social Worker. Management plans discussed with the patient, family and they are in agreement.  CODE STATUS: full  DVT Prophylaxis: Supratherapeutic INR  TOTAL TIME TAKING CARE OF THIS PATIENT: 32 minutes.  >50% time spent on counselling and coordination of care  POSSIBLE D/C IN 1-2 DAYS, DEPENDING ON CLINICAL CONDITION.  Note: This dictation was prepared with Dragon dictation along with smaller phrase technology. Any transcriptional errors that result from this process are unintentional.  Ramonita Lab M.D on 08/02/2017 at 4:46 PM  Between 7am to 6pm - Pager - [phone]  After 6pm go to [email or web address] - password [email or web address]  [facility]  Office  [phone]  CC: Primary care physician; Malva Limes, MDPatient ID: [name] [name], female   DOB: 11-Sep-1940, 77 y.o.   MRN: [id number]

## 2017-08-02 NOTE — Progress Notes (Signed)
ANTICOAGULATION CONSULT NOTE   Pharmacy Consult for Warfarin  Indication: mechanical heart valve  Allergies  Allergen Reactions  . Codeine Other (See Comments)  . Aspirin     unknown  . Penicillins     Has patient had a PCN reaction causing immediate rash, facial/tongue/throat swelling, SOB or lightheadedness with hypotension: NO Has patient had a PCN reaction causing severe rash involving mucus membranes or skin necrosis: NO Has patient had a PCN reaction that required hospitalization NO Has patient had a PCN reaction occurring within the last 10 years: NO If all of the above answers are "NO", then may proceed with Cephalosporin use.    Patient Measurements: Height: 5\' 6"  (167.6 cm) Weight: 158 lb (71.7 kg) IBW/kg (Calculated) : 59.3  Vital Signs: Temp: 98.7 F (37.1 C) (03/23 0511) BP: 156/74 (03/23 0511) Pulse Rate: 95 (03/23 0511)  Labs: Recent Labs    07/30/17 1344 07/31/17 0332 08/01/17 0535 08/02/17 0516  HGB  --   --  11.3*  --   HCT  --   --  35.0  --   PLT  --   --  418  --   LABPROT  --   --  47.4* 45.7*  INR  --   --  5.19* 4.95*  CREATININE 0.68 0.51 0.57  --     Estimated Creatinine Clearance: 59.8 mL/min (by C-G formula based on SCr of 0.57 mg/dL).   Medical History: Past Medical History:  Diagnosis Date  . Anxiety   . Carotid stenosis    a. Carotid US (9/14):  Bilat 1-39% => f/u PRN  . Dementia   . Depression   . Gastroesophageal reflux disease   . History of echocardiogram    Echo 2/17: mild LVH, EF 55-60%, mild to mod AS (mean 14 mmHg), mechanical MVR ok (mean 7 mmHgg), mild LAE, PASP 58 mmHg  . HLD (hyperlipidemia)   . HTN (hypertension)   . Mitral regurgitation    a. s/p Bjork Shiley mechanical mitral valve replacement in 1980; b. 08/2016 Echo: EF 60-65%, no rwma, mild AS, mod dil LA, no signif MR, PASP .  . 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 non-obstructive by cath 2007.  [name] PAF (paroxysmal atrial fibrillation)  (HCC)    on chronic coumadin  . Paroxysmal atrial flutter (HCC)    s/p RFA x 2  . TIA (transient ischemic attack)   . Tobacco abuse     Medications:  Medications Prior to Admission  Medication Sig Dispense Refill Last Dose  . amiodarone (PACERONE) 200 MG tablet Take 1 tablet (200 mg total) by mouth daily. 90 tablet 3 07/29/2017 at AM  . Ascorbic Acid (VITAMIN C) 500 MG tablet Take 500 mg by mouth daily.     07/29/2017 at AM  . aspirin EC 81 MG EC tablet Take 81 mg by mouth daily.     07/29/2017 at AM  . atorvastatin (LIPITOR) 40 MG tablet TAKE 1 TABLET BY MOUTH ONCE A DAY 90 tablet 4 07/28/2017 at PM  . buPROPion (WELLBUTRIN) 75 MG tablet TAKE 1 TABLET BY MOUTH EVERY MORNING 90 tablet 4 07/29/2017 at AM  . Cyanocobalamin (VITAMIN B 12) 250 MCG LOZG Take 500 mg by mouth daily.     07/29/2017 at AM  . diphenhydramine-acetaminophen (TYLENOL PM EXTRA STRENGTH) 25-500 MG TABS tablet Take 1 tablet by mouth at bedtime.    07/28/2017 at PM  . escitalopram (LEXAPRO) 20 MG tablet TAKE 1 TABLET BY MOUTH ONCE DAILY  90 tablet 4 07/29/2017 at AM  . ferrous sulfate 325 (65 FE) MG tablet Take 325 mg by mouth daily with breakfast.   07/29/2017 at AM  . lisinopril (PRINIVIL,ZESTRIL) 10 MG tablet TAKE 1 TABLET BY MOUTH ONCE DAILY 30 tablet 2 07/29/2017 at AM  . metoprolol tartrate (LOPRESSOR) 50 MG tablet Take 1 tablet (50 mg total) by mouth 2 (two) times daily. 180 tablet 3 07/29/2017 at AM  . Multiple Vitamins-Calcium (EQL ONE DAILY WOMENS PO) Take 1 tablet by mouth daily.     07/29/2017 at AM  . pantoprazole (PROTONIX) 40 MG tablet TAKE 1 TABLET BY MOUTH DAILY BEFORE BREAKFAST 90 tablet 4 07/29/2017 at AM  . temazepam (RESTORIL) 15 MG capsule TAKE 1 CAPSULE BY MOUTH EVERY NIGHT AT BEDTIME AS NEEDED 30 capsule 5 PRN at PRN  . traZODone (DESYREL) 100 MG tablet TAKE 1 TABLET BY MOUTH AT BEDTIME 30 tablet 12 07/28/2017 at PM  . warfarin (COUMADIN) 2.5 MG tablet Take 2.5 mg by mouth daily.   07/28/2017 at PM  . ALPRAZolam  (XANAX) 0.5 MG tablet TAKE 1/2 TO 1 TABLET BY MOUTH EVERY 6 HOURS AS NEEDED FOR ANXIETY 60 tablet 4 PRN at PRN  . warfarin (COUMADIN) 5 MG tablet TAKE 1 TABLET BY MOUTH EVERY DAY EXCEPT ON TUESDAYS, THURSDAYS AND SUNDAYS TAKE 1/2 TABLET (Patient not taking: Reported on 07/29/2017) 30 tablet 4 Not Taking at --    Assessment: Pharmacy consulted to dose warfarin for heart valve repalcement in this 77 year old female admitted with dehydration, influenza A and acute encephalopathy.  Pt was on Warfarin 2.5 mg PO daily at home.  On admission 3/19 INR = 5.23 ,  Hgb = 11.9, Hct = 36.4   DATE INR DOSE 3/20 5.23 HELD 3/21 6.02 HELD 3/22 5.23 HELD 3/23     4.95  Goal of Therapy:  INR 2.5 - 3.5   Plan:  INR starting to trend down. Will hold warfarin for now and recheck INR on 3/24 with AM labs. Plan to restart warfarin when INR closer to therapeutic range.   Stormy CardKatsoudas,Halden Phegley K, Tom[facility] Va Medical CenterRPH Clinical Pharmacist 08/02/2017 10:09 AM

## 2017-08-03 LAB — T4, FREE: Free T4: 1.95 ng/dL — ABNORMAL HIGH (ref 0.82–1.77)

## 2017-08-03 LAB — SPECIMEN STATUS REPORT

## 2017-08-03 LAB — T3: T3, Total: 94 ng/dL (ref 71–180)

## 2017-08-03 LAB — PROTIME-INR
INR: 5.41 — AB
Prothrombin Time: 49 seconds — ABNORMAL HIGH (ref 11.4–15.2)

## 2017-08-03 LAB — MAGNESIUM: MAGNESIUM: 1.9 mg/dL (ref 1.7–2.4)

## 2017-08-03 MED ORDER — CEPHALEXIN 500 MG PO CAPS: 500.0000 mg | ORAL_CAPSULE | Freq: Two times a day (BID) | ORAL | 0 refills | Status: DC

## 2017-08-03 MED ORDER — TORSEMIDE 20 MG PO TABS: 20.0000 mg | ORAL_TABLET | Freq: Every day | ORAL | 0 refills | Status: DC

## 2017-08-03 MED ORDER — ALPRAZOLAM 0.5 MG PO TABS: 0.5000 mg | ORAL_TABLET | Freq: Four times a day (QID) | ORAL | 0 refills | Status: DC | PRN

## 2017-08-03 NOTE — Progress Notes (Signed)
CRITICAL VALUE ALERT  Critical Value:  INR 5.41  Date & Time Notied:  5:00 am  Provider Notified: Sheryle Hailiamond  Orders Received/Actions taken: No new order

## 2017-08-03 NOTE — Progress Notes (Signed)
ANTICOAGULATION CONSULT NOTE   Pharmacy Consult for Warfarin  Indication: mechanical heart valve  Allergies  Allergen Reactions  . Codeine Other (See Comments)  . Aspirin     unknown  . Penicillins     Has patient had a PCN reaction causing immediate rash, facial/tongue/throat swelling, SOB or lightheadedness with hypotension: NO Has patient had a PCN reaction causing severe rash involving mucus membranes or skin necrosis: NO Has patient had a PCN reaction that required hospitalization NO Has patient had a PCN reaction occurring within the last 10 years: NO If all of the above answers are "NO", then may proceed with Cephalosporin use.    Patient Measurements: Height: 5\' 6"  (167.6 cm) Weight: 158 lb (71.7 kg) IBW/kg (Calculated) : 59.3  Vital Signs: Temp: 97.9 F (36.6 C) (03/24 0501) Temp Source: Oral (03/24 0501) BP: 132/63 (03/24 0501) Pulse Rate: 95 (03/24 0501)  Labs: Recent Labs    08/01/17 0535 08/02/17 0516 08/03/17 0352  HGB 11.3*  --   --   HCT 35.0  --   --   PLT 418  --   --   LABPROT 47.4* 45.7* 49.0*  INR 5.19* 4.95* 5.41*  CREATININE 0.57  --   --     Estimated Creatinine Clearance: 59.8 mL/min (by C-G formula based on SCr of 0.57 mg/dL).   Medical History: Past Medical History:  Diagnosis Date  . Anxiety   . Carotid stenosis    a. Carotid US (9/14):  Bilat 1-39% => f/u PRN  . Dementia   . Depression   . Gastroesophageal reflux disease   . History of echocardiogram    Echo 2/17: mild LVH, EF 55-60%, mild to mod AS (mean 14 mmHg), mechanical MVR ok (mean 7 mmHgg), mild LAE, PASP 58 mmHg  . HLD (hyperlipidemia)   . HTN (hypertension)   . Mitral regurgitation    a. s/p Bjork Shiley mechanical mitral valve replacement in 1980; b. 08/2016 Echo: EF 60-65%, no rwma, mild AS, mod dil LA, no signif MR, PASP .  . 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 non-obstructive by cath 2007.  [name] PAF (paroxysmal atrial fibrillation) (HCC)    on  chronic coumadin  . Paroxysmal atrial flutter (HCC)    s/p RFA x 2  . TIA (transient ischemic attack)   . Tobacco abuse     Medications:  Medications Prior to Admission  Medication Sig Dispense Refill Last Dose  . amiodarone (PACERONE) 200 MG tablet Take 1 tablet (200 mg total) by mouth daily. 90 tablet 3 07/29/2017 at AM  . Ascorbic Acid (VITAMIN C) 500 MG tablet Take 500 mg by mouth daily.     07/29/2017 at AM  . aspirin EC 81 MG EC tablet Take 81 mg by mouth daily.     07/29/2017 at AM  . atorvastatin (LIPITOR) 40 MG tablet TAKE 1 TABLET BY MOUTH ONCE A DAY 90 tablet 4 07/28/2017 at PM  . buPROPion (WELLBUTRIN) 75 MG tablet TAKE 1 TABLET BY MOUTH EVERY MORNING 90 tablet 4 07/29/2017 at AM  . Cyanocobalamin (VITAMIN B 12) 250 MCG LOZG Take 500 mg by mouth daily.     07/29/2017 at AM  . diphenhydramine-acetaminophen (TYLENOL PM EXTRA STRENGTH) 25-500 MG TABS tablet Take 1 tablet by mouth at bedtime.    07/28/2017 at PM  . escitalopram (LEXAPRO) 20 MG tablet TAKE 1 TABLET BY MOUTH ONCE DAILY 90 tablet 4 07/29/2017 at AM  . ferrous sulfate 325 (65 FE) MG tablet  Take 325 mg by mouth daily with breakfast.   07/29/2017 at AM  . lisinopril (PRINIVIL,ZESTRIL) 10 MG tablet TAKE 1 TABLET BY MOUTH ONCE DAILY 30 tablet 2 07/29/2017 at AM  . metoprolol tartrate (LOPRESSOR) 50 MG tablet Take 1 tablet (50 mg total) by mouth 2 (two) times daily. 180 tablet 3 07/29/2017 at AM  . Multiple Vitamins-Calcium (EQL ONE DAILY WOMENS PO) Take 1 tablet by mouth daily.     07/29/2017 at AM  . pantoprazole (PROTONIX) 40 MG tablet TAKE 1 TABLET BY MOUTH DAILY BEFORE BREAKFAST 90 tablet 4 07/29/2017 at AM  . temazepam (RESTORIL) 15 MG capsule TAKE 1 CAPSULE BY MOUTH EVERY NIGHT AT BEDTIME AS NEEDED 30 capsule 5 PRN at PRN  . traZODone (DESYREL) 100 MG tablet TAKE 1 TABLET BY MOUTH AT BEDTIME 30 tablet 12 07/28/2017 at PM  . warfarin (COUMADIN) 2.5 MG tablet Take 2.5 mg by mouth daily.   07/28/2017 at PM  . ALPRAZolam (XANAX) 0.5 MG  tablet TAKE 1/2 TO 1 TABLET BY MOUTH EVERY 6 HOURS AS NEEDED FOR ANXIETY 60 tablet 4 PRN at PRN  . warfarin (COUMADIN) 5 MG tablet TAKE 1 TABLET BY MOUTH EVERY DAY EXCEPT ON TUESDAYS, THURSDAYS AND SUNDAYS TAKE 1/2 TABLET (Patient not taking: Reported on 07/29/2017) 30 tablet 4 Not Taking at --    Assessment: Pharmacy consulted to dose warfarin for heart valve repalcement in this 77 year old female admitted with dehydration, influenza A and acute encephalopathy.   Pt was on Warfarin 2.5 mg PO daily at home.  On admission 3/19 INR = 5.23 ,  Hgb = 11.9, Hct = 36.4   DATE INR DOSE 3/20 5.23 HELD 3/21 6.02 HELD 3/22 5.23 HELD 3/23     4.95     HELD 3/24     5.41    Goal of Therapy:  INR 2.5 - 3.5   Plan:  Will hold warfarin for now and recheck INR with AM labs.  Plan to restart warfarin when INR closer to therapeutic range.   [name] [name],[name] [name], [facility] Medical CenterRPH Clinical Pharmacist 08/03/2017 8:31 AM

## 2017-08-03 NOTE — Discharge Summary (Signed)
[facility] [place] [facility]   PATIENT NAME: [name] [name]    MR#:  [id number]  DATE OF BIRTH:  01-01-1941  DATE OF ADMISSION:  07/29/2017 ADMITTING PHYSICIAN: [name] Finner, MD  DATE OF DISCHARGE: 08/03/2017  PRIMARY CARE PHYSICIAN: [name] Limes, MD    ADMISSION DIAGNOSIS:  Influenza A [J10.1] Hypoxia [R09.02] Urinary tract infection without hematuria, site unspecified [N39.0]  DISCHARGE DIAGNOSIS:  Acute encephalopathy due to UTI and Influenza A Advanced dementia Supratherapeutic INR (coumadin on hold for now--INR today 5.4) H/o mechanical valve--- needs to start coumadin once INR between 2.5-3.5 Mild pulmonary edema  SECONDARY DIAGNOSIS:   Past Medical History:  Diagnosis Date  . Anxiety   . Carotid stenosis    a. Carotid US (9/14):  Bilat 1-39% => f/u PRN  . Dementia   . Depression   . Gastroesophageal reflux disease   . History of echocardiogram    Echo 2/17: mild LVH, EF 55-60%, mild to mod AS (mean 14 mmHg), mechanical MVR ok (mean 7 mmHgg), mild LAE, PASP 58 mmHg  . HLD (hyperlipidemia)   . HTN (hypertension)   . Mitral regurgitation    a. s/p Bjork Shiley mechanical mitral valve replacement in 1980; b. 08/2016 Echo: EF 60-65%, no rwma, mild AS, mod dil LA, no signif MR, PASP .  . 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 non-obstructive by cath 2007.  [name] PAF (paroxysmal atrial fibrillation) (HCC)    on chronic coumadin  . Paroxysmal atrial flutter (HCC)    s/p RFA x 2  . TIA (transient ischemic attack)   . Tobacco abuse     HOSPITAL COURSE:   CeciliaMurrellis a77 y.o.femalewith a known history of chronic anxiety, depression, dementia, history of mitral regurgitation with mechanical mitral valve in 1980 on chronic Coumadin comes to the emergency room from Dr. Theodis [name] office after patient was seen therefore poor appetite not feeling well generalized weakness lethargic not her usual self and overall weak  according to daughter-in-law. She was found to be hypoxic in the upper 80s at PCPs office.  1. Acute encephalopathy on  chronic dementia -Multifactorial with influenza A positive, dehydration clinical, UTI, poor p.o. intake decreased appetite for the last several days and underlying advanced dementia.  2. Influenza A -Tamiflu for 5 days course completed  3.  Mild pulm edema Small dose torsemide for the next 3 days and primary care physician at the facility has to reassess the patient D/ced IVF which were given for dehydration    4. Supratherapeutic INR . Pt takes coumadin for  Mechanical valve -Hold warfarin for now since INR 5.45.  No active bleeding or bruises noticed -Pharmacy dose when appropriate when INR is between 2.5-3.5 (pt takes 2.5 mg) -Repeat PT/INR tomorrow 08/04/2017 and then daily basis as needed  5. UTI -Patient is listed as allergy to penicillin.   Per pharmacy patient has tolerated cephalosporins in the past.    Give Keflex for 3 more days.  6. Advanced dementia with failure to thrive,poor p.o. intake, malaise fatigue, per family overall declining  7. DVT prophylaxis -On Coumadin with supratherapeutic INR  Overall appears to have a poor prognosis. Management social worker for discharge planning--to SNF today    CONSULTS OBTAINED:    DRUG ALLERGIES:   Allergies  Allergen Reactions  . Codeine Other (See Comments)  . Aspirin     unknown  . Penicillins     Has patient had a PCN reaction causing immediate rash, facial/tongue/throat  swelling, SOB or lightheadedness with hypotension: NO Has patient had a PCN reaction causing severe rash involving mucus membranes or skin necrosis: NO Has patient had a PCN reaction that required hospitalization NO Has patient had a PCN reaction occurring within the last 10 years: NO If all of the above answers are "NO", then may proceed with Cephalosporin use.    DISCHARGE MEDICATIONS:   Allergies as of  08/03/2017      Reactions   Codeine Other (See Comments)   Aspirin    unknown   Penicillins    Has patient had a PCN reaction causing immediate rash, facial/tongue/throat swelling, SOB or lightheadedness with hypotension: NO Has patient had a PCN reaction causing severe rash involving mucus membranes or skin necrosis: NO Has patient had a PCN reaction that required hospitalization NO Has patient had a PCN reaction occurring within the last 10 years: NO If all of the above answers are "NO", then may proceed with Cephalosporin use.      Medication List    STOP taking these medications   aspirin EC 81 MG tablet   pantoprazole 40 MG tablet Commonly known as:  PROTONIX   temazepam 15 MG capsule Commonly known as:  RESTORIL   TYLENOL PM EXTRA STRENGTH 25-500 MG Tabs tablet Generic drug:  diphenhydramine-acetaminophen   warfarin 2.5 MG tablet Commonly known as:  COUMADIN   warfarin 5 MG tablet Commonly known as:  COUMADIN     TAKE these medications   ALPRAZolam 0.5 MG tablet Commonly known as:  XANAX Take 1 tablet (0.5 mg total) by mouth every 6 (six) hours as needed for anxiety. What changed:  See the new instructions.   amiodarone 200 MG tablet Commonly known as:  PACERONE Take 1 tablet (200 mg total) by mouth daily.   atorvastatin 40 MG tablet Commonly known as:  LIPITOR TAKE 1 TABLET BY MOUTH ONCE A DAY   buPROPion 75 MG tablet Commonly known as:  WELLBUTRIN TAKE 1 TABLET BY MOUTH EVERY MORNING   cephALEXin 500 MG capsule Commonly known as:  KEFLEX Take 1 capsule (500 mg total) by mouth every 12 (twelve) hours.   EQL ONE DAILY WOMENS PO Take 1 tablet by mouth daily.   escitalopram 20 MG tablet Commonly known as:  LEXAPRO TAKE 1 TABLET BY MOUTH ONCE DAILY   ferrous sulfate 325 (65 FE) MG tablet Take 325 mg by mouth daily with breakfast.   lisinopril 10 MG tablet Commonly known as:  PRINIVIL,ZESTRIL TAKE 1 TABLET BY MOUTH ONCE DAILY   metoprolol  tartrate 50 MG tablet Commonly known as:  LOPRESSOR Take 1 tablet (50 mg total) by mouth 2 (two) times daily.   torsemide 20 MG tablet Commonly known as:  DEMADEX Take 1 tablet (20 mg total) by mouth daily.   traZODone 100 MG tablet Commonly known as:  DESYREL TAKE 1 TABLET BY MOUTH AT BEDTIME   Vitamin B 12 250 MCG Lozg Take 500 mg by mouth daily.   vitamin C 500 MG tablet Commonly known as:  ASCORBIC ACID Take 500 mg by mouth daily.            Durable Medical Equipment  (From admission, onward)        Start     Ordered   07/30/17 1440  For home use only DME Walker rolling  Once    Question:  Patient needs a walker to treat with the following condition  Answer:  Weakness   07/30/17 1441  If you experience worsening of your admission symptoms, develop shortness of breath, life threatening emergency, suicidal or homicidal thoughts you must seek medical attention immediately by calling 911 or calling your MD immediately  if symptoms less severe.  You Must read complete instructions/literature along with all the possible adverse reactions/side effects for all the Medicines you take and that have been prescribed to you. Take any new Medicines after you have completely understood and accept all the possible adverse reactions/side effects.   Please note  You were cared for by a hospitalist during your hospital stay. If you have any questions about your discharge medications or the care you received while you were in the hospital after you are discharged, you can call the unit and asked to speak with the hospitalist on call if the hospitalist that took care of you is not available. Once you are discharged, your primary care physician will handle any further medical issues. Please note that NO REFILLS for any discharge medications will be authorized once you are discharged, as it is imperative that you return to your primary care physician (or establish a relationship with a  primary care physician if you do not have one) for your aftercare needs so that they can reassess your need for medications and monitor your lab values. Today   SUBJECTIVE   confused  VITAL SIGNS:  Blood pressure (!) 156/72, pulse 94, temperature 98.8 F (37.1 C), temperature source Oral, resp. rate 16, height 5\' 6"  (1.676 m), weight 71.7 kg (158 lb), SpO2 96 %.  I/O:  No intake or output data in the 24 hours ending 08/03/17 0955  PHYSICAL EXAMINATION:  GENERAL:  77 y.o.-year-old patient lying in the bed with no acute distress.  EYES: Pupils equal, round, reactive to light and accommodation. No scleral icterus. Extraocular muscles intact.  HEENT: Head atraumatic, normocephalic. Oropharynx and nasopharynx clear.  NECK:  Supple, no jugular venous distention. No thyroid enlargement, no tenderness.  LUNGS: Normal breath sounds bilaterally, no wheezing, rales,rhonchi or crepitation. No use of accessory muscles of respiration.  CARDIOVASCULAR: S1, S2 normal. No murmurs, rubs, or gallops.  ABDOMEN: Soft, non-tender, non-distended. Bowel sounds present. No organomegaly or mass.  EXTREMITIES: No pedal edema, cyanosis, or clubbing.  NEUROLOGIC:non focal PSYCHIATRIC: The patient is alert and awake SKIN: No obvious rash, lesion, or ulcer.   DATA REVIEW:   CBC  Recent Labs  Lab 08/01/17 0535  WBC 6.6  HGB 11.3*  HCT 35.0  PLT 418    Chemistries  Recent Labs  Lab 08/01/17 0535  NA 141  K 3.5  CL 108  CO2 25  GLUCOSE 99  BUN 9  CREATININE 0.57  CALCIUM 8.5*  MG 1.8    Microbiology Results   No results found for this or any previous visit (from the past 240 hour(s)).  RADIOLOGY:  Dg Chest Port 1 View  Result Date: 08/02/2017 CLINICAL DATA:  Short of breath EXAM: PORTABLE CHEST 1 VIEW COMPARISON:  07/29/2017 FINDINGS: The heart is mildly enlarged. Postop changes from mitral valve replacement are noted. There are Kerley B-lines and prominent interstitial markings at the  lung bases compatible with mild interstitial edema. Vascularity is indistinct. No pneumothorax. IMPRESSION: Mild CHF and interstitial edema. Electronically Signed   By: Jolaine ClickArthur  Hoss M.D.   On: 08/02/2017 14:49     Management plans discussed with the patient's son and he is in agreement.  CODE STATUS:     Code Status Orders  (From admission, onward)  Start     Ordered   07/29/17 2224  Full code  Continuous     07/29/17 2223    Code Status History    Date Active Date Inactive Code Status Order ID Comments User Context   06/03/2015 1132 06/09/2015 1719 Full Code [id number]  Denton Brick, MD ED   05/07/2014 2030 05/11/2014 1842 Full Code [id number]  Eldred Manges, MD Inpatient   05/06/2014 0202 05/07/2014 2030 Full Code [id number]  Gust Rung, DO Inpatient      TOTAL TIME TAKING CARE OF THIS PATIENT: 45 minutes.    Ramonita Lab M.D on 08/03/2017 at 9:55 AM  Between 7am to 6pm - Pager - [phone] After 6pm go to [email or web address] - Social research officer, government  [facility]  Office  [phone]  CC: Primary care physician; [name] Limes, MD

## 2017-08-03 NOTE — Progress Notes (Signed)
Patient discharged via EMS, IV removed catheter intact. Patient with no complaints. [facility].

## 2017-08-03 NOTE — Clinical Social Work Note (Signed)
The [facility] will [facility] via non-emergent EMS due to AMS/weakness. The patient's son and the facility are aware and in agreement. The CSW will deliver the discharge packet when able, at which point the CSW will sign off. Please consult should additional needs arise.  Argentina PonderKaren Martha Javares Kaufhold, MSW, Theresia MajorsLCSWA [phone]

## 2017-08-07 ENCOUNTER — Telehealth: Payer: Self-pay

## 2017-08-07 NOTE — Telephone Encounter (Signed)
That's fine, just need to know all the days he missed work.

## 2017-08-07 NOTE — Telephone Encounter (Signed)
Patients son Eulah CitizenSteven Kutz called wanting to know if Dr. Sherrie MustacheFisher would sign FL[employer] forms for his job. Viviann SpareSteven says that he has missed several days of work due to having to take care of his mom and dad who are both patients of Dr. Sherrie MustacheFisher. His mom was recently hospitalized and was [facility]. Viviann SpareSteven wants to know if Dr. Sherrie MustacheFisher will complete his FMLA forms. Please call Viviann SpareSteven back to let him know at ( [phone].

## 2017-08-08 NOTE — Telephone Encounter (Signed)
[name] [name] was advised.

## 2017-08-13 ENCOUNTER — Telehealth: Payer: Self-pay | Admitting: *Deleted

## 2017-08-13 NOTE — Telephone Encounter (Signed)
Received from [facility] request for confimration of add-on testing for Free T4 and T3 from lab work from 07/16/17. Used Diagnosis codes: I48.0, I48.92, I10. Signed and faxed to [facility] at both fax numbers [phone] and [phone].

## 2017-08-20 ENCOUNTER — Ambulatory Visit: Payer: Medicare PPO | Admitting: Internal Medicine

## 2017-08-26 ENCOUNTER — Telehealth: Payer: Self-pay | Admitting: Family Medicine

## 2017-08-26 NOTE — Telephone Encounter (Signed)
Pt's son said he has sent FMLA paperwork two weeks ago and also brought the paperwork in last week.  He has not gotten the paperwork back and needs it back asap.  This is to take care of his mom.  He put the days on the paperwork that he took care of his mom.   His contact number is [phone]  Thanks  Barth Kirksteri

## 2017-08-29 NOTE — Telephone Encounter (Addendum)
Forms were faxed to [phone]. copy of forms are ready to pickup. LMOVM notifying Viviann SpareSteven.

## 2017-10-27 IMAGING — CR DG CHEST 1V PORT
1 series · 1 of 1 positions shown · non-contrast
Comparison: Prior chest x-ray 04/17/2009

CLINICAL DATA: 74-year-old female with chest pain earlier this
morning and confusion currently.

EXAM:
PORTABLE CHEST 1 VIEW

[AP]
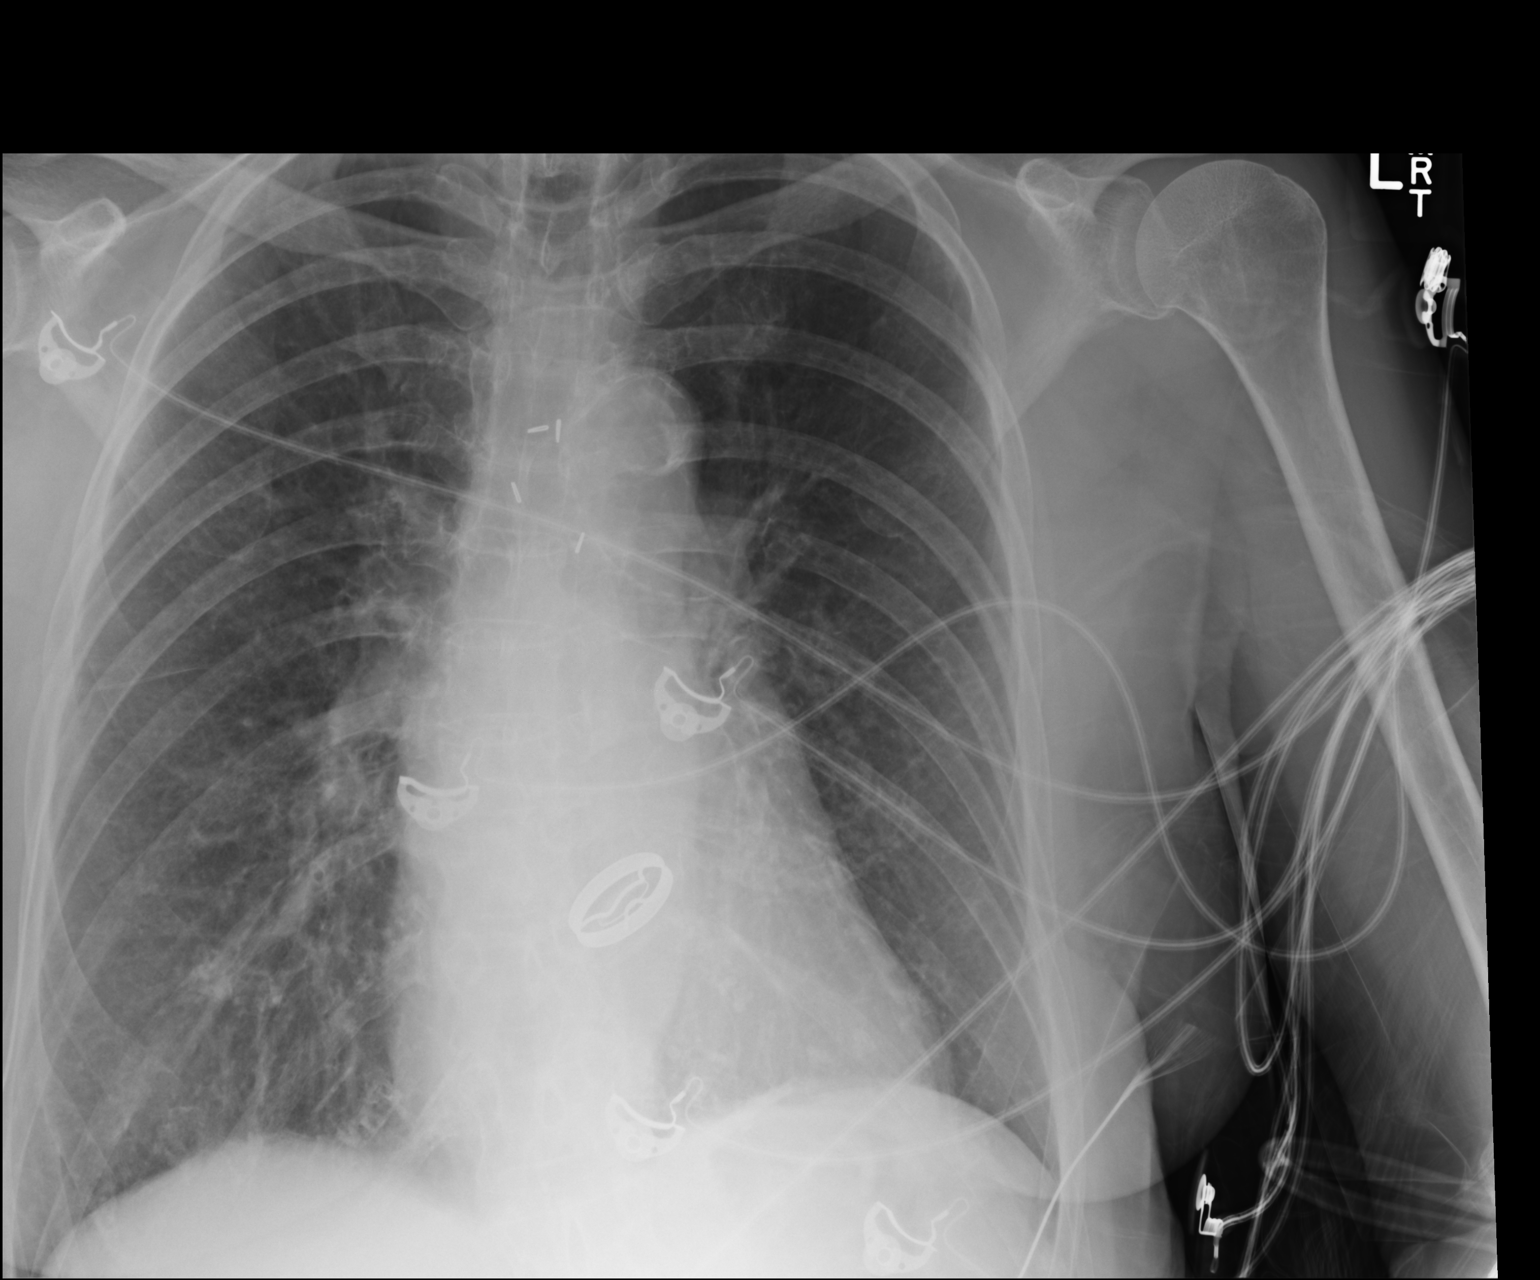

[1 of 1 positions shown; findings below may reference images not displayed]

FINDINGS: Stable cardiac and mediastinal contours. Mechanical mitral valve.
Surgical clips present in the anterior mediastinum. Atherosclerotic
calcifications again noted in the transverse aorta. Stable
hyperinflation and bronchitic changes, particularly in the right
middle lobe. There may be mild bronchiectasis. No focal infiltrate,
pleural effusion, pulmonary edema or pneumothorax. Trace biapical
pleural parenchymal scarring. No suspicious nodule or mass. No acute
osseous abnormality.
IMPRESSION: 1. No acute cardiopulmonary process.
2. Hyperinflation and central bronchitic change suggest underlying
COPD.
3. Bronchitic change with possible mild bronchiectasis in the right
middle lobe.

## 2017-11-15 ENCOUNTER — Emergency Department: Payer: Medicare PPO

## 2017-11-15 ENCOUNTER — Encounter: Payer: Self-pay | Admitting: Emergency Medicine

## 2017-11-15 ENCOUNTER — Inpatient Hospital Stay
Admission: EM | Admit: 2017-11-15 | Discharge: 2017-11-19 | DRG: 871 | Disposition: A | Discharge status: Skilled Nursing Facility | Payer: Medicare PPO | Source: Skilled Nursing Facility | Attending: Internal Medicine | Admitting: Internal Medicine

## 2017-11-15 ENCOUNTER — Other Ambulatory Visit: Payer: Self-pay

## 2017-11-15 DIAGNOSIS — E876 Hypokalemia: Secondary | ICD-10-CM | POA: Diagnosis present

## 2017-11-15 DIAGNOSIS — G9341 Metabolic encephalopathy: Secondary | ICD-10-CM | POA: Diagnosis present

## 2017-11-15 DIAGNOSIS — Z66 Do not resuscitate: Secondary | ICD-10-CM | POA: Diagnosis present

## 2017-11-15 DIAGNOSIS — E785 Hyperlipidemia, unspecified: Secondary | ICD-10-CM | POA: Diagnosis present

## 2017-11-15 DIAGNOSIS — I10 Essential (primary) hypertension: Secondary | ICD-10-CM | POA: Diagnosis present

## 2017-11-15 DIAGNOSIS — Z79899 Other long term (current) drug therapy: Secondary | ICD-10-CM

## 2017-11-15 DIAGNOSIS — K219 Gastro-esophageal reflux disease without esophagitis: Secondary | ICD-10-CM | POA: Diagnosis present

## 2017-11-15 DIAGNOSIS — Z885 Allergy status to narcotic agent status: Secondary | ICD-10-CM

## 2017-11-15 DIAGNOSIS — R402312 Coma scale, best motor response, none, at arrival to emergency department: Secondary | ICD-10-CM | POA: Diagnosis present

## 2017-11-15 DIAGNOSIS — I2510 Atherosclerotic heart disease of native coronary artery without angina pectoris: Secondary | ICD-10-CM | POA: Diagnosis present

## 2017-11-15 DIAGNOSIS — R451 Restlessness and agitation: Secondary | ICD-10-CM | POA: Diagnosis not present

## 2017-11-15 DIAGNOSIS — R402112 Coma scale, eyes open, never, at arrival to emergency department: Secondary | ICD-10-CM | POA: Diagnosis present

## 2017-11-15 DIAGNOSIS — Z952 Presence of prosthetic heart valve: Secondary | ICD-10-CM

## 2017-11-15 DIAGNOSIS — L8990 Pressure ulcer of unspecified site, unspecified stage: Secondary | ICD-10-CM

## 2017-11-15 DIAGNOSIS — R6520 Severe sepsis without septic shock: Secondary | ICD-10-CM | POA: Diagnosis present

## 2017-11-15 DIAGNOSIS — N179 Acute kidney failure, unspecified: Secondary | ICD-10-CM | POA: Diagnosis present

## 2017-11-15 DIAGNOSIS — E860 Dehydration: Secondary | ICD-10-CM | POA: Diagnosis present

## 2017-11-15 DIAGNOSIS — Z8673 Personal history of transient ischemic attack (TIA), and cerebral infarction without residual deficits: Secondary | ICD-10-CM

## 2017-11-15 DIAGNOSIS — F419 Anxiety disorder, unspecified: Secondary | ICD-10-CM | POA: Diagnosis present

## 2017-11-15 DIAGNOSIS — I482 Chronic atrial fibrillation: Secondary | ICD-10-CM | POA: Diagnosis present

## 2017-11-15 DIAGNOSIS — Z7901 Long term (current) use of anticoagulants: Secondary | ICD-10-CM

## 2017-11-15 DIAGNOSIS — Z8249 Family history of ischemic heart disease and other diseases of the circulatory system: Secondary | ICD-10-CM

## 2017-11-15 DIAGNOSIS — Z880 Allergy status to penicillin: Secondary | ICD-10-CM

## 2017-11-15 DIAGNOSIS — R4182 Altered mental status, unspecified: Secondary | ICD-10-CM

## 2017-11-15 DIAGNOSIS — F329 Major depressive disorder, single episode, unspecified: Secondary | ICD-10-CM | POA: Diagnosis present

## 2017-11-15 DIAGNOSIS — F0390 Unspecified dementia without behavioral disturbance: Secondary | ICD-10-CM | POA: Diagnosis present

## 2017-11-15 DIAGNOSIS — E870 Hyperosmolality and hypernatremia: Secondary | ICD-10-CM | POA: Diagnosis not present

## 2017-11-15 DIAGNOSIS — Z79891 Long term (current) use of opiate analgesic: Secondary | ICD-10-CM

## 2017-11-15 DIAGNOSIS — Z96642 Presence of left artificial hip joint: Secondary | ICD-10-CM | POA: Diagnosis present

## 2017-11-15 DIAGNOSIS — B9620 Unspecified Escherichia coli [E. coli] as the cause of diseases classified elsewhere: Secondary | ICD-10-CM | POA: Diagnosis present

## 2017-11-15 DIAGNOSIS — N390 Urinary tract infection, site not specified: Secondary | ICD-10-CM | POA: Diagnosis present

## 2017-11-15 DIAGNOSIS — F17210 Nicotine dependence, cigarettes, uncomplicated: Secondary | ICD-10-CM | POA: Diagnosis present

## 2017-11-15 DIAGNOSIS — Z1612 Extended spectrum beta lactamase (ESBL) resistance: Secondary | ICD-10-CM | POA: Diagnosis present

## 2017-11-15 DIAGNOSIS — E440 Moderate protein-calorie malnutrition: Secondary | ICD-10-CM | POA: Diagnosis present

## 2017-11-15 DIAGNOSIS — Z789 Other specified health status: Secondary | ICD-10-CM

## 2017-11-15 DIAGNOSIS — A419 Sepsis, unspecified organism: Secondary | ICD-10-CM | POA: Diagnosis not present

## 2017-11-15 DIAGNOSIS — R402212 Coma scale, best verbal response, none, at arrival to emergency department: Secondary | ICD-10-CM | POA: Diagnosis present

## 2017-11-15 DIAGNOSIS — Z886 Allergy status to analgesic agent status: Secondary | ICD-10-CM

## 2017-11-15 DIAGNOSIS — Z6824 Body mass index (BMI) 24.0-24.9, adult: Secondary | ICD-10-CM

## 2017-11-15 LAB — URINALYSIS, ROUTINE W REFLEX MICROSCOPIC
Bilirubin Urine: NEGATIVE
GLUCOSE, UA: NEGATIVE mg/dL
Ketones, ur: NEGATIVE mg/dL
NITRITE: NEGATIVE
PH: 5 (ref 5.0–8.0)
PROTEIN: 30 mg/dL — AB
Specific Gravity, Urine: 1.012 (ref 1.005–1.030)
Squamous Epithelial / LPF: NONE SEEN (ref 0–5)

## 2017-11-15 LAB — COMPREHENSIVE METABOLIC PANEL
ALBUMIN: 2.7 g/dL — AB (ref 3.5–5.0)
ALT: 79 U/L — ABNORMAL HIGH (ref 0–44)
ANION GAP: 11 (ref 5–15)
AST: 99 U/L — ABNORMAL HIGH (ref 15–41)
Alkaline Phosphatase: 118 U/L (ref 38–126)
BILIRUBIN TOTAL: 1.1 mg/dL (ref 0.3–1.2)
BUN: 44 mg/dL — ABNORMAL HIGH (ref 8–23)
CALCIUM: 9.3 mg/dL (ref 8.9–10.3)
CO2: 27 mmol/L (ref 22–32)
Chloride: 114 mmol/L — ABNORMAL HIGH (ref 98–111)
Creatinine, Ser: 2.06 mg/dL — ABNORMAL HIGH (ref 0.44–1.00)
GFR calc Af Amer: 26 mL/min — ABNORMAL LOW (ref >60)
GFR calc non Af Amer: 22 mL/min — ABNORMAL LOW (ref >60)
GLUCOSE: 175 mg/dL — AB (ref 70–99)
POTASSIUM: 3.8 mmol/L (ref 3.5–5.1)
SODIUM: 152 mmol/L — AB (ref 135–145)
TOTAL PROTEIN: 7.3 g/dL (ref 6.5–8.1)

## 2017-11-15 LAB — GLUCOSE, CAPILLARY: GLUCOSE-CAPILLARY: 151 mg/dL — AB (ref 70–99)

## 2017-11-15 LAB — CBC
HCT: 37.7 % (ref 35.0–47.0)
Hemoglobin: 12.3 g/dL (ref 12.0–16.0)
MCH: 28.1 pg (ref 26.0–34.0)
MCHC: 32.6 g/dL (ref 32.0–36.0)
MCV: 86.2 fL (ref 80.0–100.0)
Platelets: 270 10*3/uL (ref 150–440)
RBC: 4.38 MIL/uL (ref 3.80–5.20)
RDW: 17.8 % — AB (ref 11.5–14.5)
WBC: 15.4 10*3/uL — ABNORMAL HIGH (ref 3.6–11.0)

## 2017-11-15 LAB — MRSA PCR SCREENING: MRSA by PCR: NEGATIVE

## 2017-11-15 LAB — TROPONIN I: TROPONIN I: 0.03 ng/mL — AB (ref <0.03)

## 2017-11-15 LAB — LACTIC ACID, PLASMA
Lactic Acid, Venous: 1.9 mmol/L (ref 0.5–1.9)
Lactic Acid, Venous: 1.3 mmol/L (ref 0.5–1.9)

## 2017-11-15 LAB — PROTIME-INR
INR: 2.51
Prothrombin Time: 26.9 seconds — ABNORMAL HIGH (ref 11.4–15.2)

## 2017-11-15 MED ORDER — VITAMIN C 500 MG PO TABS
500.0000 mg | ORAL_TABLET | Freq: Every day | ORAL | Status: DC
  Administered 2017-11-16 – 2017-11-19 (×4): 500 mg via ORAL
  Filled 2017-11-15 (×4): qty 1

## 2017-11-15 MED ORDER — ATORVASTATIN CALCIUM 20 MG PO TABS
40.0000 mg | ORAL_TABLET | Freq: Every day | ORAL | Status: DC
  Administered 2017-11-15 – 2017-11-17 (×3): 40 mg via ORAL
  Filled 2017-11-15 (×4): qty 2

## 2017-11-15 MED ORDER — DOCUSATE SODIUM 100 MG PO CAPS: 100.0000 mg | ORAL_CAPSULE | Freq: Two times a day (BID) | ORAL | Status: DC | PRN

## 2017-11-15 MED ORDER — FERROUS SULFATE 325 (65 FE) MG PO TABS
325.0000 mg | ORAL_TABLET | Freq: Every day | ORAL | Status: DC
  Administered 2017-11-16 – 2017-11-19 (×4): 325 mg via ORAL
  Filled 2017-11-15 (×4): qty 1

## 2017-11-15 MED ORDER — CYANOCOBALAMIN 500 MCG PO TABS
500.0000 ug | ORAL_TABLET | Freq: Every day | ORAL | Status: DC
  Administered 2017-11-16 – 2017-11-19 (×4): 500 ug via ORAL
  Filled 2017-11-15 (×4): qty 1

## 2017-11-15 MED ORDER — HEPARIN SODIUM (PORCINE) 5000 UNIT/ML IJ SOLN
5000.0000 [IU] | Freq: Three times a day (TID) | INTRAMUSCULAR | Status: DC
  Administered 2017-11-15 – 2017-11-18 (×8): 5000 [IU] via SUBCUTANEOUS
  Filled 2017-11-15 (×8): qty 1

## 2017-11-15 MED ORDER — SODIUM CHLORIDE 0.9 % IV SOLN
2.0000 g | Freq: Once | INTRAVENOUS | Status: AC
  Administered 2017-11-15: 2 g via INTRAVENOUS
  Filled 2017-11-15: qty 2

## 2017-11-15 MED ORDER — SODIUM CHLORIDE 0.9 % IV BOLUS (SEPSIS)
1000.0000 mL | Freq: Once | INTRAVENOUS | Status: AC
  Administered 2017-11-15: 1000 mL via INTRAVENOUS

## 2017-11-15 MED ORDER — RISPERIDONE 1 MG PO TABS
2.0000 mg | ORAL_TABLET | Freq: Every day | ORAL | Status: DC
  Administered 2017-11-15 – 2017-11-17 (×3): 2 mg via ORAL
  Filled 2017-11-15 (×6): qty 2

## 2017-11-15 MED ORDER — SODIUM CHLORIDE 0.9 % IV BOLUS
500.0000 mL | Freq: Once | INTRAVENOUS | Status: AC
  Administered 2017-11-15: 500 mL via INTRAVENOUS

## 2017-11-15 MED ORDER — AMIODARONE HCL 200 MG PO TABS
200.0000 mg | ORAL_TABLET | Freq: Every day | ORAL | Status: DC
  Administered 2017-11-16 – 2017-11-19 (×4): 200 mg via ORAL
  Filled 2017-11-15 (×4): qty 1

## 2017-11-15 MED ORDER — SODIUM CHLORIDE 0.9 % IV BOLUS (SEPSIS): 1000.0000 mL | Freq: Once | INTRAVENOUS | Status: DC

## 2017-11-15 MED ORDER — BUPROPION HCL 75 MG PO TABS
75.0000 mg | ORAL_TABLET | Freq: Every morning | ORAL | Status: DC
  Administered 2017-11-16 – 2017-11-18 (×3): 75 mg via ORAL
  Filled 2017-11-15 (×3): qty 1

## 2017-11-15 MED ORDER — SODIUM CHLORIDE 0.45 % IV SOLN
INTRAVENOUS | Status: DC
Start: 2017-11-15 — End: 2017-11-18
  Administered 2017-11-15: 1000 mL via INTRAVENOUS
  Administered 2017-11-16 – 2017-11-18 (×6): via INTRAVENOUS

## 2017-11-15 MED ORDER — SODIUM CHLORIDE 0.9 % IV SOLN
1.0000 g | INTRAVENOUS | Status: DC
  Administered 2017-11-16 – 2017-11-17 (×2): 1 g via INTRAVENOUS
  Filled 2017-11-15: qty 10
  Filled 2017-11-15: qty 1
  Filled 2017-11-15 (×2): qty 10

## 2017-11-15 MED ORDER — SODIUM CHLORIDE 0.9 % IV SOLN
Freq: Once | INTRAVENOUS | Status: AC
  Administered 2017-11-15: 1000 mL via INTRAVENOUS

## 2017-11-15 MED ORDER — SODIUM CHLORIDE 0.9 % IV BOLUS
1000.0000 mL | Freq: Once | INTRAVENOUS | Status: AC
  Administered 2017-11-15: 1000 mL via INTRAVENOUS

## 2017-11-15 MED ORDER — LEVOFLOXACIN IN D5W 750 MG/150ML IV SOLN
750.0000 mg | Freq: Once | INTRAVENOUS | Status: AC
  Administered 2017-11-15: 750 mg via INTRAVENOUS
  Filled 2017-11-15: qty 150

## 2017-11-15 NOTE — ED Notes (Signed)
Family is going to get some food and will return.

## 2017-11-15 NOTE — ED Notes (Signed)
Left patient's son Brett CanalesSteve, a voicemail updating him on her status and that she was accepted on 1C and going to room 122.

## 2017-11-15 NOTE — ED Notes (Signed)
Patient taken to CT by Morrie SheldonAshley, RN.

## 2017-11-15 NOTE — Progress Notes (Signed)
Collected information from family to complete assessment. Provided family with OMBUDsman phone number. They are concerned that at the facility their Mother seems over medicated.  LCSW will complete assessment and Fl2 in the morning  Carolynne Schuchard  [phone]

## 2017-11-15 NOTE — ED Notes (Signed)
Vernona RiegerLaura, EDT to take patient to 1C-122

## 2017-11-15 NOTE — Progress Notes (Signed)
Family Meeting Note  Advance Directive:no  Today a meeting took place with the son.  Patient is unable to participate due ZO:XWRUEAto:Lacked capacity lethargic   The following clinical team members were present during this meeting:MD  The following were discussed:Patient's diagnosis:sepsis, UTI, Ac renal failure, dementia , Patient's progosis: Unable to determine and Goals for treatment: DNR  Additional follow-up to be provided: PMD  Time spent during discussion:20 minutes  [name] DillingVaibhavkumar Omesha Bowerman, MD

## 2017-11-15 NOTE — ED Notes (Signed)
Attempted report x1, 1C nurse is contacting Dr. Madelon LipsVacchani about appropriateness of patient coming to that unit.  Patient is DNR, but not comfort care.  RN is concerned of hypotensiveness.

## 2017-11-15 NOTE — Plan of Care (Signed)
  Problem: Education: Goal: Knowledge of General Education information will improve Outcome: Not Progressing   Problem: Health Behavior/Discharge Planning: Goal: Ability to manage health-related needs will improve Outcome: Not Progressing   Problem: Clinical Measurements: Goal: Ability to maintain clinical measurements within normal limits will improve Outcome: Not Progressing Goal: Will remain free from infection Outcome: Not Progressing Goal: Diagnostic test results will improve Outcome: Not Progressing Goal: Respiratory complications will improve Outcome: Not Progressing Goal: Cardiovascular complication will be avoided Outcome: Not Progressing   Problem: Activity: Goal: Risk for activity intolerance will decrease Outcome: Not Progressing   Problem: Nutrition: Goal: Adequate nutrition will be maintained Outcome: Not Progressing   Problem: Coping: Goal: Level of anxiety will decrease Outcome: Not Progressing   Problem: Elimination: Goal: Will not experience complications related to bowel motility Outcome: Not Progressing Goal: Will not experience complications related to urinary retention Outcome: Not Progressing   Problem: Pain Managment: Goal: General experience of comfort will improve Outcome: Not Progressing   Problem: Safety: Goal: Ability to remain free from injury will improve Outcome: Not Progressing   Problem: Skin Integrity: Goal: Risk for impaired skin integrity will decrease Outcome: Not Progressing   

## 2017-11-15 NOTE — ED Notes (Signed)
Per Dr. Pershing ProudSchaevitz, pt to receive a total of 2L NS only at this time.

## 2017-11-15 NOTE — ED Notes (Addendum)
Per Dr. Pershing ProudSchaevitz, ok to start atbx without second set of cultures.  Informed him pt had been stuck mulitple times for blood cultures and barely able to obtain the first set.

## 2017-11-15 NOTE — ED Notes (Signed)
Report to David W. RN 

## 2017-11-15 NOTE — ED Notes (Signed)
Hospitalist at bedside 

## 2017-11-15 NOTE — ED Notes (Signed)
Family at bedside. 

## 2017-11-15 NOTE — H&P (Signed)
[facility] [place] [facility]   PATIENT NAME: [name] [name]    MR#:  [id number]  DATE OF BIRTH:  May 01, 1941  DATE OF ADMISSION:  11/15/2017  PRIMARY CARE PHYSICIAN: Malva LimesFisher, Donald E, MD   REQUESTING/REFERRING PHYSICIAN: [email or web address]  CHIEF COMPLAINT:   Chief Complaint  Patient presents with  . Altered Mental Status    HISTORY OF PRESENT ILLNESS: [name] [name]  is a 77 y.o. female with a known history of Anxiety, Dementia- lives in dementia unit in [facility], a fib, Htn, HLD- sent from NH due to altered mental status. She is also on anxiety meds there due to her severe dementia. Noted to have UTI and Ac renal failure. She have Hypotension , which responded to IV fluids.  PAST MEDICAL HISTORY:   Past Medical History:  Diagnosis Date  . Anxiety   . Carotid stenosis    a. Carotid US (9/14):  Bilat 1-39% => f/u PRN  . Dementia   . Depression   . Gastroesophageal reflux disease   . History of echocardiogram    Echo 2/17: mild LVH, EF 55-60%, mild to mod AS (mean 14 mmHg), mechanical MVR ok (mean 7 mmHgg), mild LAE, PASP 58 mmHg  . HLD (hyperlipidemia)   . HTN (hypertension)   . Mitral regurgitation    a. s/p Bjork Shiley mechanical mitral valve replacement in 1980; b. 08/2016 Echo: EF 60-65%, no rwma, mild AS, mod dil LA, no signif MR, PASP 60mmHg.  . 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 non-obstructive by cath 2007.  [name]. PAF (paroxysmal atrial fibrillation) (HCC)    on chronic coumadin  . Paroxysmal atrial flutter (HCC)    s/p RFA x 2  . TIA (transient ischemic attack)   . Tobacco abuse     PAST SURGICAL HISTORY:  Past Surgical History:  Procedure Laterality Date  . A FLUTTER ABLATION     x 2  . ESOPHAGOGASTRODUODENOSCOPY N/A 06/04/2015   Procedure: ESOPHAGOGASTRODUODENOSCOPY (EGD);  Surgeon: Iva Booparl E Gessner, MD;  [facility];  Service: Endoscopy;  Laterality: N/A;  . HIP ARTHROPLASTY Left 05/07/2014   Procedure:  ARTHROPLASTY MONOPOLAR HIP;  Surgeon: Eldred MangesMark C Yates, MD;  Location: [facility] OR;  Service: Orthopedics;  Laterality: Left;  . MITRAL VALVE REPLACEMENT     Bjork-Shiley valve placed for mitral regurgitation    SOCIAL HISTORY:  Social History   Tobacco Use  . Smoking status: Current Some Day Smoker    Packs/day: 0.25    Years: 35.00    Pack years: 8.75    Types: Cigarettes    Last attempt to quit: 08/25/2015    Years since quitting: 2.2  . Smokeless tobacco: Former Engineer, waterUser  Substance Use Topics  . Alcohol use: No    FAMILY HISTORY:  Family History  Problem Relation Age of Onset  . Heart attack Other   . Hypertension Neg Hx   . Stroke Neg Hx     DRUG ALLERGIES:  Allergies  Allergen Reactions  . Codeine Other (See Comments)  . Aspirin     unknown  . Penicillins     Has patient had a PCN reaction causing immediate rash, facial/tongue/throat swelling, SOB or lightheadedness with hypotension: NO Has patient had a PCN reaction causing severe rash involving mucus membranes or skin necrosis: NO Has patient had a PCN reaction that required hospitalization NO Has patient had a PCN reaction occurring within the last 10 years: NO If all of the above answers are "NO", then  may proceed with Cephalosporin use.    REVIEW OF SYSTEMS:   Pt is lethargic, can not give ROS.  MEDICATIONS AT HOME:  Prior to Admission medications   Medication Sig Start Date End Date Taking? Authorizing Provider  ALPRAZolam Prudy Feeler) 0.5 MG tablet Take 1 tablet (0.5 mg total) by mouth every 6 (six) hours as needed for anxiety. 08/03/17  Yes Gouru, Deanna Artis, MD  amiodarone (PACERONE) 200 MG tablet Take 1 tablet (200 mg total) by mouth daily. 03/07/17  Yes Creig Hines, NP  Ascorbic Acid (VITAMIN C) 500 MG tablet Take 500 mg by mouth daily.     Yes [provider]  atorvastatin (LIPITOR) 40 MG tablet TAKE 1 TABLET BY MOUTH ONCE A DAY 07/29/16  Yes Malva Limes, MD  Cyanocobalamin (VITAMIN B 12) 250  MCG LOZG Take 500 mg by mouth daily.     Yes [provider]  escitalopram (LEXAPRO) 20 MG tablet TAKE 1 TABLET BY MOUTH ONCE DAILY 02/28/17  Yes Malva Limes, MD  ferrous sulfate 325 (65 FE) MG tablet Take 325 mg by mouth daily with breakfast.   Yes [provider]  lisinopril (PRINIVIL,ZESTRIL) 10 MG tablet TAKE 1 TABLET BY MOUTH ONCE DAILY 05/29/17  Yes End, Cristal Deer, MD  metoprolol tartrate (LOPRESSOR) 50 MG tablet Take 1 tablet (50 mg total) by mouth 2 (two) times daily. 02/05/17 11/15/17 Yes End, Cristal Deer, MD  Multiple Vitamin (MULTIVITAMIN WITH MINERALS) TABS tablet Take 1 tablet by mouth daily.   Yes [provider]  Multiple Vitamins-Calcium (EQL ONE DAILY WOMENS PO) Take 1 tablet by mouth daily.     Yes [provider]  QUEtiapine (SEROQUEL) 25 MG tablet Take 25 mg by mouth at bedtime. 11/14/17  Yes [provider]  risperiDONE (RISPERDAL) 1 MG tablet Take 2 mg by mouth at bedtime. 09/25/17  Yes [provider]  torsemide (DEMADEX) 20 MG tablet Take 1 tablet (20 mg total) by mouth daily. 08/03/17  Yes Gouru, Deanna Artis, MD  traZODone (DESYREL) 100 MG tablet TAKE 1 TABLET BY MOUTH AT BEDTIME 07/03/17  Yes Malva Limes, MD  buPROPion [facility]) 75 MG tablet TAKE 1 TABLET BY MOUTH EVERY MORNING Patient not taking: Reported on 11/15/2017 04/15/16   Malva Limes, MD  cephALEXin (KEFLEX) 500 MG capsule Take 1 capsule (500 mg total) by mouth every 12 (twelve) hours. Patient not taking: Reported on 11/15/2017 08/03/17   Ramonita Lab, MD  JANTOVEN 3 MG tablet Take 3 mg by mouth every evening. 11/07/17   [provider]      PHYSICAL EXAMINATION:   VITAL SIGNS: Blood pressure (!) 89/35, pulse (!) 56, temperature (!) 97.4 F (36.3 C), temperature source Oral, resp. rate 18, height 5\' 2"  (1.575 m), weight 59 kg (130 lb), SpO2 100 %.  GENERAL:  77 y.o.-year-old patient lying in the bed with no acute distress.  EYES: Pupils equal,  round, reactive to light and accommodation. No scleral icterus. Extraocular muscles intact.  HEENT: Head atraumatic, normocephalic. Oropharynx and nasopharynx clear.  NECK:  Supple, no jugular venous distention. No thyroid enlargement, no tenderness.  LUNGS: Normal breath sounds bilaterally, no wheezing, rales,rhonchi or crepitation. No use of accessory muscles of respiration.  CARDIOVASCULAR: S1, S2 normal. No murmurs, rubs, or gallops.  ABDOMEN: Soft, nontender, nondistended. Bowel sounds present. No organomegaly or mass.  EXTREMITIES: No pedal edema, cyanosis, or clubbing.  NEUROLOGIC: lethargic, opens eyes to stimuli and speaks a few words, does not follow commands. PSYCHIATRIC: The patient  is lethargic.  SKIN: No obvious rash, lesion, or ulcer.   LABORATORY PANEL:   CBC Recent Labs  Lab 11/15/17 1431  WBC 15.4*  HGB 12.3  HCT 37.7  PLT 270  MCV 86.2  MCH 28.1  MCHC 32.6  RDW 17.8*   ------------------------------------------------------------------------------------------------------------------  Chemistries  Recent Labs  Lab 11/15/17 1431  NA 152*  K 3.8  CL 114*  CO2 27  GLUCOSE 175*  BUN 44*  CREATININE 2.06*  CALCIUM 9.3  AST 99*  ALT 79*  ALKPHOS 118  BILITOT 1.1   ------------------------------------------------------------------------------------------------------------------ estimated creatinine clearance is 18.1 mL/min (A) (by C-G formula based on SCr of 2.06 mg/dL (H)). ------------------------------------------------------------------------------------------------------------------ No results for input(s): TSH, T4TOTAL, T3FREE, THYROIDAB in the last 72 hours.  Invalid input(s): FREET3   Coagulation profile No results for input(s): INR, PROTIME in the last 168 hours. ------------------------------------------------------------------------------------------------------------------- No results for input(s): DDIMER in the last 72  hours. -------------------------------------------------------------------------------------------------------------------  Cardiac Enzymes Recent Labs  Lab 11/15/17 1431  TROPONINI 0.03*   ------------------------------------------------------------------------------------------------------------------ Invalid input(s): POCBNP  ---------------------------------------------------------------------------------------------------------------  Urinalysis    Component Value Date/Time   COLORURINE YELLOW (A) 11/15/2017 1432   APPEARANCEUR TURBID (A) 11/15/2017 1432   LABSPEC 1.012 11/15/2017 1432   PHURINE 5.0 11/15/2017 1432   GLUCOSEU NEGATIVE 11/15/2017 1432   HGBUR SMALL (A) 11/15/2017 1432   BILIRUBINUR NEGATIVE 11/15/2017 1432   KETONESUR NEGATIVE 11/15/2017 1432   PROTEINUR 30 (A) 11/15/2017 1432   UROBILINOGEN 0.2 05/06/2014 0231   NITRITE NEGATIVE 11/15/2017 1432   LEUKOCYTESUR LARGE (A) 11/15/2017 1432     RADIOLOGY: Dg Chest 1 View  Result Date: 11/15/2017 CLINICAL DATA:  Status post CPR. EXAM: CHEST  1 VIEW COMPARISON:  08/02/2017 FINDINGS: 1443 hours. Lungs are hyperexpanded. The cardio pericardial silhouette is enlarged. The lungs are clear without focal pneumonia, edema, pneumothorax or pleural effusion. Interstitial markings are diffusely coarsened with chronic features. Status post cardiac valve replacement. Telemetry leads overlie the chest. Defibrillator pads are seen over the left chest. IMPRESSION: No acute cardiopulmonary findings. Electronically Signed   By: Kennith Center M.D.   On: 11/15/2017 15:12    EKG: Orders placed or performed during the hospital encounter of 11/15/17  . EKG 12-Lead  . EKG 12-Lead    IMPRESSION AND PLAN:  * Sepsis    UTi     Rocephin IV, Bl and ur cx sent   IV fluids  * Ac renal failure   IV fluids, Monitor  * hypernatremia   Due to dehydration   IV 1/2 NS  * Dementia, Anxiety   Will hold Xanax.  * Htn   Hold the  meds due to Hypotension and sepsis.  All the records are reviewed and case discussed with ED provider. Management plans discussed with the patient, family and they are in agreement.  CODE STATUS: DNR Code Status History    Date Active Date Inactive Code Status Order ID Comments User Context   07/29/2017 2223 08/03/2017 1849 Full Code [id number]  Enedina Finner, MD Inpatient   06/03/2015 1132 06/09/2015 1719 Full Code [id number]  Denton Brick, MD ED   05/07/2014 2030 05/11/2014 1842 Full Code [id number]  Eldred Manges, MD Inpatient   05/06/2014 0202 05/07/2014 2030 Full Code [id number]  Gust Rung, DO Inpatient       TOTAL TIME TAKING CARE OF THIS PATIENT: 50 minutes.  Discussed with son and daughter in law in room.  Altamese Dilling M.D on 11/15/2017   Between 7am to 6pm -  Pager - [phone]  After 6pm go to [email or web address] - password [email or web address]  [facility]  Office  [phone]  CC: Primary care physician; Malva Limes, MD   Note: This dictation was prepared with Dragon dictation along with smaller phrase technology. Any transcriptional errors that result from this process are unintentional.

## 2017-11-15 NOTE — ED Triage Notes (Addendum)
[facility] with reports of unresponsive and post CPR.  Per EMS staff initiated CPR about 10  mins PTA and ceased CPR upon EMS arrival.    Staff reported to EMS pt was pulseless.  Was reported to only have taken Xanax today.  Pt was given Narcan 2mg  by EMS.  Pupils pinpoint. Pt did respond  More after narcan, but is not at baseline.

## 2017-11-15 NOTE — Progress Notes (Signed)
CODE SEPSIS - PHARMACY COMMUNICATION  **Broad Spectrum Antibiotics should be administered within 1 hour of Sepsis diagnosis**  Time Code Sepsis Called/Page Received: 14:53  Antibiotics Ordered: Levaquin and Azactam  Time of 1st antibiotic administration: 15:11  Additional action taken by pharmacy: n/a  If necessary, Name of Provider/Nurse Contacted: n/a   Clovia CuffLisa Legrande Hao, PharmD, BCPS 11/15/2017 3:16 PM

## 2017-11-15 NOTE — ED Notes (Addendum)
Had conversation with Dr. Elisabeth PigeonVachhani and he ordered 500mL bolus x1, then DC remainder of that NS and give 0.45% infusion instead.

## 2017-11-15 NOTE — ED Notes (Signed)
Patient is now DNR per family conversation with hospitalist.

## 2017-11-15 NOTE — ED Provider Notes (Signed)
[facility] Emergency Department Provider Note ____________________________________________   First MD Initiated Contact with Patient 11/15/17 1444     (approximate)  I have reviewed the triage vital signs and the nursing notes.  HISTORY  Chief Complaint Altered Mental Status  HPI [name] [name] is a 77 y.o. female with a history of dementia as well as atrial fibrillation on Coumadin who is presenting to the emergency department with altered mental status.  EMS reports the patient was found unresponsive.  Staff at the patient's skilled nursing facility had difficulty finding a pulse and so did CPR.  However, CPR was terminated.  EMS arrived.  Patient found to have a pulse as well as a glucose of 161.  Pinpoint pupils upon arrival.  Patient without prescription for opiates but given 2 mg of IV Narcan with minimal response.  Patient unable to give history now secondary to altered mentation.  Past Medical History:  Diagnosis Date  . Anxiety   . Carotid stenosis    a. Carotid US (9/14):  Bilat 1-39% => f/u PRN  . Dementia   . Depression   . Gastroesophageal reflux disease   . History of echocardiogram    Echo 2/17: mild LVH, EF 55-60%, mild to mod AS (mean 14 mmHg), mechanical MVR ok (mean 7 mmHgg), mild LAE, PASP 58 mmHg  . HLD (hyperlipidemia)   . HTN (hypertension)   . Mitral regurgitation    a. s/p Bjork Shiley mechanical mitral valve replacement in 1980; b. 08/2016 Echo: EF 60-65%, no rwma, mild AS, mod dil LA, no signif MR, PASP .  . 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 non-obstructive by cath 2007.  [name] PAF (paroxysmal atrial fibrillation) (HCC)    on chronic coumadin  . Paroxysmal atrial flutter (HCC)    s/p RFA x 2  . TIA (transient ischemic attack)   . Tobacco abuse     Patient Active Problem List   Diagnosis Date Noted  . Encephalopathy acute 07/29/2017  . Valvular heart disease 06/13/2017  . Subclinical hypothyroidism  06/16/2016  . Coronary artery disease involving native coronary artery of native heart without angina pectoris 06/16/2015  . Aspiration pneumonia (HCC) 06/09/2015  . Atrial flutter (HCC) 06/07/2015  . Hypokalemia 06/07/2015  . Non-occlusive coronary artery disease 06/07/2015  . Acute upper gastrointestinal bleeding   . Erosive gastritis with hemorrhage   . 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 H/O prosthetic heart valve 03/27/2015  . Insomnia 03/27/2015  . 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 BP (high blood pressure) 03/27/2015  . H/O transient cerebral ischemia 03/27/2015  . Arthritis, degenerative 03/27/2015  . Closed fracture of hip (HCC) 03/27/2015  . Anxiety 03/27/2015  . Hyperlipemia 03/27/2015  . Depression 10/17/2014  . Dementia 05/06/2014  . Fracture of femoral neck, left (HCC) 05/06/2014  . Displaced fracture of left femoral neck (HCC) 05/05/2014  . Encounter for therapeutic drug monitoring 06/09/2013  . Carotid stenosis 04/08/2011  . Transient ischemic attack (TIA) 08/07/2010  . TOBACCO ABUSE 09/21/2008  . Essential hypertension, benign 09/21/2008  . PAF (paroxysmal atrial fibrillation) (HCC) 09/21/2008    Past Surgical History:  Procedure Laterality Date  . A FLUTTER ABLATION     x 2  . ESOPHAGOGASTRODUODENOSCOPY N/A 06/04/2015   Procedure: ESOPHAGOGASTRODUODENOSCOPY (EGD);  Surgeon: Iva Boop, MD;  Location: [facility] ENDOSCOPY;  Service: Endoscopy;  Laterality: N/A;  . HIP ARTHROPLASTY Left 05/07/2014   Procedure: ARTHROPLASTY MONOPOLAR HIP;  Surgeon: Eldred Manges, MD;  Location:  [facility] OR;  Service: Orthopedics;  Laterality: Left;  . MITRAL VALVE REPLACEMENT     Bjork-Shiley valve placed for mitral regurgitation    Prior to Admission medications   Medication Sig Start Date End Date Taking? Authorizing Provider  ALPRAZolam Prudy Feeler) 0.5 MG tablet Take 1 tablet (0.5 mg total) by mouth every 6 (six) hours as needed for anxiety. 08/03/17  Yes Gouru, Deanna Artis, MD    amiodarone (PACERONE) 200 MG tablet Take 1 tablet (200 mg total) by mouth daily. 03/07/17  Yes Creig Hines, NP  Ascorbic Acid (VITAMIN C) 500 MG tablet Take 500 mg by mouth daily.     Yes [provider]  atorvastatin (LIPITOR) 40 MG tablet TAKE 1 TABLET BY MOUTH ONCE A DAY 07/29/16  Yes Malva Limes, MD  Cyanocobalamin (VITAMIN B 12) 250 MCG LOZG Take 500 mg by mouth daily.     Yes [provider]  escitalopram (LEXAPRO) 20 MG tablet TAKE 1 TABLET BY MOUTH ONCE DAILY 02/28/17  Yes Malva Limes, MD  ferrous sulfate 325 (65 FE) MG tablet Take 325 mg by mouth daily with breakfast.   Yes [provider]  lisinopril (PRINIVIL,ZESTRIL) 10 MG tablet TAKE 1 TABLET BY MOUTH ONCE DAILY 05/29/17  Yes End, Cristal Deer, MD  metoprolol tartrate (LOPRESSOR) 50 MG tablet Take 1 tablet (50 mg total) by mouth 2 (two) times daily. 02/05/17 11/15/17 Yes End, Cristal Deer, MD  Multiple Vitamin (MULTIVITAMIN WITH MINERALS) TABS tablet Take 1 tablet by mouth daily.   Yes [provider]  Multiple Vitamins-Calcium (EQL ONE DAILY WOMENS PO) Take 1 tablet by mouth daily.     Yes [provider]  QUEtiapine (SEROQUEL) 25 MG tablet Take 25 mg by mouth at bedtime. 11/14/17  Yes [provider]  risperiDONE (RISPERDAL) 1 MG tablet Take 2 mg by mouth at bedtime. 09/25/17  Yes [provider]  torsemide (DEMADEX) 20 MG tablet Take 1 tablet (20 mg total) by mouth daily. 08/03/17  Yes Gouru, Deanna Artis, MD  traZODone (DESYREL) 100 MG tablet TAKE 1 TABLET BY MOUTH AT BEDTIME 07/03/17  Yes Malva Limes, MD  buPROPion [facility]) 75 MG tablet TAKE 1 TABLET BY MOUTH EVERY MORNING Patient not taking: Reported on 11/15/2017 04/15/16   Malva Limes, MD  cephALEXin (KEFLEX) 500 MG capsule Take 1 capsule (500 mg total) by mouth every 12 (twelve) hours. Patient not taking: Reported on 11/15/2017 08/03/17   Ramonita Lab, MD  JANTOVEN 3 MG tablet Take 3 mg by mouth  every evening. 11/07/17   [provider]    Allergies Codeine; Aspirin; and Penicillins  Family History  Problem Relation Age of Onset  . Heart attack Other   . Hypertension Neg Hx   . Stroke Neg Hx     Social History Social History   Tobacco Use  . Smoking status: Current Some Day Smoker    Packs/day: 0.25    Years: 35.00    Pack years: 8.75    Types: Cigarettes    Last attempt to quit: 08/25/2015    Years since quitting: 2.2  . Smokeless tobacco: Former Engineer, water Use Topics  . Alcohol use: No  . Drug use: No    Review of Systems  Level 5 caveat secondary to altered mental status.   ____________________________________________   PHYSICAL EXAM:  VITAL SIGNS: ED Triage Vitals  Enc Vitals Group     BP 11/15/17 1423 104/90     Pulse Rate 11/15/17 1423 69  Resp 11/15/17 1423 (!) 69     Temp 11/15/17 1423 (!) 97.4 F (36.3 C)     Temp Source 11/15/17 1423 Oral     SpO2 11/15/17 1423 100 %     Weight 11/15/17 1434 130 lb (59 kg)     Height 11/15/17 1434 5\' 2"  (1.575 m)     Head Circumference --      Peak Flow --      Pain Score --      Pain Loc --      Pain Edu? --      Excl. in GC? --     Constitutional: Initial GCS of 3.   Eyes: Conjunctivae are normal.  Head: Atraumatic. Nose: No congestion/rhinnorhea. Mouth/Throat: Mucous membranes are moist.  Neck: No stridor.   Cardiovascular: Normal rate, regular rhythm. Grossly normal heart sounds.  Good peripheral circulation. Respiratory: Normal respiratory effort.  No retractions. Lungs CTAB. Gastrointestinal: Soft and nontender. No distention. No CVA tenderness. Musculoskeletal: No lower extremity tenderness nor edema.  No joint effusions. Neurologic:  Normal speech and language. No gross focal neurologic deficits are appreciated. Skin:  Skin is warm, dry and intact. No rash noted. Psychiatric: Mood and affect are normal. Speech and behavior are  normal.  ____________________________________________   LABS (all labs ordered are listed, but only abnormal results are displayed)  Labs Reviewed  COMPREHENSIVE METABOLIC PANEL - Abnormal; Notable for the following components:      Result Value   Sodium 152 (*)    Chloride 114 (*)    Glucose, Bld 175 (*)    BUN 44 (*)    Creatinine, Ser 2.06 (*)    Albumin 2.7 (*)    AST 99 (*)    ALT 79 (*)    GFR calc non Af Amer 22 (*)    GFR calc Af Amer 26 (*)    All other components within normal limits  CBC - Abnormal; Notable for the following components:   WBC 15.4 (*)    RDW 17.8 (*)    All other components within normal limits  BLOOD GAS, VENOUS - Abnormal; Notable for the following components:   Bicarbonate 30.6 (*)    Acid-Base Excess 3.3 (*)    All other components within normal limits  URINALYSIS, ROUTINE W REFLEX MICROSCOPIC - Abnormal; Notable for the following components:   Color, Urine YELLOW (*)    APPearance TURBID (*)    Hgb urine dipstick SMALL (*)    Protein, ur 30 (*)    Leukocytes, UA LARGE (*)    RBC / HPF >50 (*)    WBC, UA >50 (*)    Bacteria, UA MANY (*)    All other components within normal limits  GLUCOSE, CAPILLARY - Abnormal; Notable for the following components:   Glucose-Capillary 151 (*)    All other components within normal limits  CULTURE, BLOOD (ROUTINE X 2)  CULTURE, BLOOD (ROUTINE X 2)  URINE CULTURE  LACTIC ACID, PLASMA  LACTIC ACID, PLASMA  PROTIME-INR  TROPONIN I  CBG MONITORING, ED   ____________________________________________  EKG  ED ECG REPORT I, Arelia Longest, the attending physician, personally viewed and interpreted this ECG.   Date: 11/15/2017  EKG Time: 1427  Rate: 75  Rhythm: normal sinus rhythm  Axis: Normal  Intervals:nonspecific intraventricular conduction delay  ST&T Change: Minimal ST depression with scooped T wave morphology in 2 3 and aVF as well as V4 through V6.  No ST elevations. No significant  change  from previous. ____________________________________________  RADIOLOGY  Chest x-ray without acute process ____________________________________________   PROCEDURES  Procedure(s) performed:   .Critical Care Performed by: Myrna BlazerSchaevitz, David Matthew, MD Authorized by: Myrna BlazerSchaevitz, David Matthew, MD   Critical care provider statement:    Critical care time (minutes):  35   Critical care time was exclusive of:  Separately billable procedures and treating other patients   Critical care was necessary to treat or prevent imminent or life-threatening deterioration of the following conditions:  Sepsis and CNS failure or compromise   Critical care was time spent personally by me on the following activities:  Development of treatment plan with patient or surrogate, discussions with consultants, evaluation of patient's response to treatment, examination of patient, obtaining history from patient or surrogate, ordering and performing treatments and interventions, ordering and review of laboratory studies, ordering and review of radiographic studies, pulse oximetry, re-evaluation of patient's condition and review of old charts    Critical Care performed:    ____________________________________________   INITIAL IMPRESSION / ASSESSMENT AND PLAN / ED COURSE  Pertinent labs & imaging results that were available during my care of the patient were reviewed by me and considered in my medical decision making (see chart for details).  Differential diagnosis includes, but is not limited to, alcohol, illicit or prescription medications, or other toxic ingestion; intracranial pathology such as stroke or intracerebral hemorrhage; fever or infectious causes including sepsis; hypoxemia and/or hypercarbia; uremia; trauma; endocrine related disorders such as diabetes, hypoglycemia, and thyroid-related diseases; hypertensive encephalopathy; etc. As part of my medical decision making, I reviewed the following  data within the electronic MEDICAL RECORD NUMBER Notes from prior ED visits  ----------------------------------------- 3:25 PM on 11/15/2017 -----------------------------------------  I had a lengthy discussion with the patient's son as well as daughter-in-law and husband.  At this point they are wishing to complete DNR paperwork.  The patient already has an advanced degree of dementia.  At this point intervention such as CPR or placing the patient on a ventilator would likely cause unnecessary pain.  Patient appears to have a UTI as well as renal failure and an elevated sodium.  To be admitted to the hospital.  Signed out to Dr. Elisabeth PigeonVachhani.  Family understanding of the treatment plan and willing to comply.  Pending CT of the head neck at this time.  However, even if there is a bleed.  There would likely be any aggressive interventions.  Family does report a fall several days ago. ____________________________________________   FINAL CLINICAL IMPRESSION(S) / ED DIAGNOSES  Sepsis.  Altered mental status.  UTI.  Renal failure.  Hypernatremia.    NEW MEDICATIONS STARTED DURING THIS VISIT:  New Prescriptions   No medications on file     Note:  This document was prepared using Dragon voice recognition software and may include unintentional dictation errors.     Myrna BlazerSchaevitz, David Matthew, MD 11/15/17 [phone]

## 2017-11-15 NOTE — ED Notes (Addendum)
Date and time results received: 11/15/17 1555  Test: Troponin Critical Value: 0.03  Name of Provider Notified: Mayford KnifeWilliams  Orders Received? Or Actions Taken?: None

## 2017-11-16 LAB — CBC
HEMATOCRIT: 32.9 % — AB (ref 35.0–47.0)
HEMOGLOBIN: 10.7 g/dL — AB (ref 12.0–16.0)
MCH: 28.1 pg (ref 26.0–34.0)
MCHC: 32.5 g/dL (ref 32.0–36.0)
MCV: 86.5 fL (ref 80.0–100.0)
Platelets: 215 10*3/uL (ref 150–440)
RBC: 3.8 MIL/uL (ref 3.80–5.20)
RDW: 17.2 % — ABNORMAL HIGH (ref 11.5–14.5)
WBC: 11.8 10*3/uL — AB (ref 3.6–11.0)

## 2017-11-16 LAB — BASIC METABOLIC PANEL
ANION GAP: 4 — AB (ref 5–15)
BUN: 38 mg/dL — AB (ref 8–23)
CO2: 26 mmol/L (ref 22–32)
Calcium: 8 mg/dL — ABNORMAL LOW (ref 8.9–10.3)
Chloride: 121 mmol/L — ABNORMAL HIGH (ref 98–111)
Creatinine, Ser: 1.42 mg/dL — ABNORMAL HIGH (ref 0.44–1.00)
GFR, EST AFRICAN AMERICAN: 40 mL/min — AB (ref >60)
GFR, EST NON AFRICAN AMERICAN: 35 mL/min — AB (ref >60)
Glucose, Bld: 87 mg/dL (ref 70–99)
POTASSIUM: 3.2 mmol/L — AB (ref 3.5–5.1)
SODIUM: 151 mmol/L — AB (ref 135–145)

## 2017-11-16 LAB — MAGNESIUM: MAGNESIUM: 1.7 mg/dL (ref 1.7–2.4)

## 2017-11-16 MED ORDER — HALOPERIDOL 1 MG PO TABS
1.0000 mg | ORAL_TABLET | Freq: Four times a day (QID) | ORAL | Status: DC | PRN
  Administered 2017-11-16 – 2017-11-19 (×4): 1 mg via ORAL
  Filled 2017-11-16 (×6): qty 1

## 2017-11-16 MED ORDER — ALPRAZOLAM 0.5 MG PO TABS: 0.5000 mg | ORAL_TABLET | Freq: Four times a day (QID) | ORAL | Status: DC | PRN

## 2017-11-16 MED ORDER — POTASSIUM CHLORIDE CRYS ER 20 MEQ PO TBCR
40.0000 meq | EXTENDED_RELEASE_TABLET | Freq: Once | ORAL | Status: AC
  Administered 2017-11-16: 40 meq via ORAL
  Filled 2017-11-16: qty 2

## 2017-11-16 MED ORDER — ESCITALOPRAM OXALATE 10 MG PO TABS
20.0000 mg | ORAL_TABLET | Freq: Every day | ORAL | Status: DC
  Administered 2017-11-17 – 2017-11-19 (×3): 20 mg via ORAL
  Filled 2017-11-16 (×3): qty 2

## 2017-11-16 MED ORDER — QUETIAPINE FUMARATE 25 MG PO TABS
25.0000 mg | ORAL_TABLET | Freq: Every day | ORAL | Status: DC
  Administered 2017-11-16 – 2017-11-17 (×2): 25 mg via ORAL
  Filled 2017-11-16 (×3): qty 1

## 2017-11-16 MED ORDER — MAGNESIUM SULFATE 2 GM/50ML IV SOLN
2.0000 g | Freq: Once | INTRAVENOUS | Status: AC
  Administered 2017-11-16: 16:00:00 2 g via INTRAVENOUS
  Filled 2017-11-16: qty 50

## 2017-11-16 MED ORDER — TRAZODONE HCL 50 MG PO TABS
100.0000 mg | ORAL_TABLET | Freq: Every day | ORAL | Status: DC
  Administered 2017-11-16 – 2017-11-17 (×2): 100 mg via ORAL
  Filled 2017-11-16 (×3): qty 2

## 2017-11-16 NOTE — Progress Notes (Signed)
Patient yelling out to hall, notified Dr. Imogene Burnhen, Dr. Imogene Burnhen stated he would put an order in.

## 2017-11-16 NOTE — Progress Notes (Signed)
[employer] - [place] [facility]   PATIENT NAME: [name] [name]    MR#:  [id number]  DATE OF BIRTH:  12-24-1940  SUBJECTIVE:  CHIEF COMPLAINT:   Chief Complaint  Patient presents with  . Altered Mental Status   The patient is demented, noncommunicative.  She was drowsy in the morning and later agitated per RN. REVIEW OF SYSTEMS:  Review of Systems  Unable to perform ROS: Mental status change    DRUG ALLERGIES:   Allergies  Allergen Reactions  . Codeine Other (See Comments)  . Aspirin     unknown  . Penicillins     Has patient had a PCN reaction causing immediate rash, facial/tongue/throat swelling, SOB or lightheadedness with hypotension: NO Has patient had a PCN reaction causing severe rash involving mucus membranes or skin necrosis: NO Has patient had a PCN reaction that required hospitalization NO Has patient had a PCN reaction occurring within the last 10 years: NO If all of the above answers are "NO", then may proceed with Cephalosporin use.   VITALS:  Blood pressure (!) 119/45, pulse 65, temperature 98.1 F (36.7 C), temperature source Oral, resp. rate 16, height 5\' 2"  (1.575 m), weight 130 lb (59 kg), SpO2 100 %. PHYSICAL EXAMINATION:  Physical Exam  Constitutional: She appears well-developed.  HENT:  Head: Normocephalic.  Eyes: Conjunctivae are normal. No scleral icterus.  Neck: Neck supple. No JVD present. No tracheal deviation present.  Cardiovascular: Normal rate, regular rhythm and normal heart sounds. Exam reveals no gallop.  No murmur heard. Pulmonary/Chest: Effort normal and breath sounds normal. No respiratory distress. She has no wheezes. She has no rales.  Abdominal: Soft. Bowel sounds are normal. She exhibits no distension. There is no tenderness. There is no rebound.  Musculoskeletal: She exhibits no edema or tenderness.  Neurological: No cranial nerve deficit.  Patient is drowsy and lethargic, unable to exam.  Skin: No  rash noted. No erythema.  Psychiatric:  Demented.   LABORATORY PANEL:  Female CBC Recent Labs  Lab 11/16/17 0637  WBC 11.8*  HGB 10.7*  HCT 32.9*  PLT 215   ------------------------------------------------------------------------------------------------------------------ Chemistries  Recent Labs  Lab 11/15/17 1431 11/16/17 0637  NA 152* 151*  K 3.8 3.2*  CL 114* 121*  CO2 27 26  GLUCOSE 175* 87  BUN 44* 38*  CREATININE 2.06* 1.42*  CALCIUM 9.3 8.0*  MG  --  1.7  AST 99*  --   ALT 79*  --   ALKPHOS 118  --   BILITOT 1.1  --    RADIOLOGY:  Dg Chest 1 View  Result Date: 11/15/2017 CLINICAL DATA:  Status post CPR. EXAM: CHEST  1 VIEW COMPARISON:  08/02/2017 FINDINGS: 1443 hours. Lungs are hyperexpanded. The cardio pericardial silhouette is enlarged. The lungs are clear without focal pneumonia, edema, pneumothorax or pleural effusion. Interstitial markings are diffusely coarsened with chronic features. Status post cardiac valve replacement. Telemetry leads overlie the chest. Defibrillator pads are seen over the left chest. IMPRESSION: No acute cardiopulmonary findings. Electronically Signed   By: Kennith Center M.D.   On: 11/15/2017 15:12   Ct Head Wo Contrast  Result Date: 11/15/2017 CLINICAL DATA:  Unresponsive. EXAM: CT HEAD WITHOUT CONTRAST CT CERVICAL SPINE WITHOUT CONTRAST TECHNIQUE: Multidetector CT imaging of the head and cervical spine was performed following the standard protocol without intravenous contrast. Multiplanar CT image reconstructions of the cervical spine were also generated. COMPARISON:  Head CT 05/06/2014. FINDINGS: CT HEAD FINDINGS Brain: There is  no evidence for acute hemorrhage, hydrocephalus, mass lesion, or abnormal extra-axial fluid collection. No definite CT evidence for acute infarction. Diffuse loss of parenchymal volume is consistent with atrophy. Patchy low attenuation in the deep hemispheric and periventricular white matter is nonspecific, but  likely reflects chronic microvascular ischemic demyelination. Chronic left frontal infarct noted. Vascular: No hyperdense vessel or unexpected calcification. Skull: No evidence for fracture. No worrisome lytic or sclerotic lesion. Sinuses/Orbits: The visualized paranasal sinuses and mastoid air cells are clear. Visualized portions of the globes and intraorbital fat are unremarkable. Other: None. CT CERVICAL SPINE FINDINGS Alignment: Reversal of normal cervical lordosis. Trace retrolisthesis of C3 on 4 is likely related to the loss of disc height at this level. Skull base and vertebrae: Congenital fusion at C2-3. Patient is also fused between the right occipital condyle and the right C1 lateral mass. No evidence for an acute fracture. No worrisome lytic or sclerotic osseous abnormality. Soft tissues and spinal canal: No prevertebral fluid or swelling. No visible canal hematoma. Disc levels: Loss of disc height noted at C3-4, C5-6, C6-7, and C7-T1. Diffuse facet degeneration noted bilaterally, right slightly more than left. Upper chest: Chronic interstitial coarsening. Other: None. IMPRESSION: 1. No acute intracranial abnormality. Atrophy with chronic small vessel white matter ischemic disease. 2. Degenerative changes in the cervical spine without acute fracture. Loss of cervical lordosis. This can be related to patient positioning, muscle spasm or soft tissue injury. Electronically Signed   By: Kennith CenterEric  Mansell M.D.   On: 11/15/2017 16:26   Ct Cervical Spine Wo Contrast  Result Date: 11/15/2017 CLINICAL DATA:  Unresponsive. EXAM: CT HEAD WITHOUT CONTRAST CT CERVICAL SPINE WITHOUT CONTRAST TECHNIQUE: Multidetector CT imaging of the head and cervical spine was performed following the standard protocol without intravenous contrast. Multiplanar CT image reconstructions of the cervical spine were also generated. COMPARISON:  Head CT 05/06/2014. FINDINGS: CT HEAD FINDINGS Brain: There is no evidence for acute hemorrhage,  hydrocephalus, mass lesion, or abnormal extra-axial fluid collection. No definite CT evidence for acute infarction. Diffuse loss of parenchymal volume is consistent with atrophy. Patchy low attenuation in the deep hemispheric and periventricular white matter is nonspecific, but likely reflects chronic microvascular ischemic demyelination. Chronic left frontal infarct noted. Vascular: No hyperdense vessel or unexpected calcification. Skull: No evidence for fracture. No worrisome lytic or sclerotic lesion. Sinuses/Orbits: The visualized paranasal sinuses and mastoid air cells are clear. Visualized portions of the globes and intraorbital fat are unremarkable. Other: None. CT CERVICAL SPINE FINDINGS Alignment: Reversal of normal cervical lordosis. Trace retrolisthesis of C3 on 4 is likely related to the loss of disc height at this level. Skull base and vertebrae: Congenital fusion at C2-3. Patient is also fused between the right occipital condyle and the right C1 lateral mass. No evidence for an acute fracture. No worrisome lytic or sclerotic osseous abnormality. Soft tissues and spinal canal: No prevertebral fluid or swelling. No visible canal hematoma. Disc levels: Loss of disc height noted at C3-4, C5-6, C6-7, and C7-T1. Diffuse facet degeneration noted bilaterally, right slightly more than left. Upper chest: Chronic interstitial coarsening. Other: None. IMPRESSION: 1. No acute intracranial abnormality. Atrophy with chronic small vessel white matter ischemic disease. 2. Degenerative changes in the cervical spine without acute fracture. Loss of cervical lordosis. This can be related to patient positioning, muscle spasm or soft tissue injury. Electronically Signed   By: Kennith CenterEric  Mansell M.D.   On: 11/15/2017 16:26   ASSESSMENT AND PLAN:  Beverely LowCecilia Daman  is a 77 y.o.  female with a known history of Anxiety, Dementia- lives in dementia unit in [facility], a fib, Htn, HLD- sent from NH due to altered mental status.     Acute metabolic encephalopathy due to sepsis and UTI. Aspiration fall precaution.  * Sepsis due to UTI  Continue Rocephin IV, leukocytosis improved, follow-up blood culture and urine culture.  * Acute renal failure due to dehydration.  Continue half-normal saline IV and follow-up BMP.  Hold lisinopril and torsemide.  * hypernatremia   Due to dehydration   Continue half-normal saline IV and follow-up BMP.  Hypokalemia.  Potassium supplement and follow-up BMP.  * Dementia, Anxiety resume Xanax.  * HTN   Hold the meds due to Hypotension and sepsis.  All the records are reviewed and case discussed with Care Management/Social Worker. Management plans discussed with the patient, family and they are in agreement.  CODE STATUS: DNR  TOTAL TIME TAKING CARE OF THIS PATIENT: 36 minutes.   More than 50% of the time was spent in counseling/coordination of care: YES  POSSIBLE D/C IN 2-3 DAYS, DEPENDING ON CLINICAL CONDITION.   Shaune Pollack M.D on 11/16/2017 at 1:30 PM  Between 7am to 6pm - Pager - [phone]  After 6pm go to [email or web address] - Therapist, nutritional Hospitalists

## 2017-11-16 NOTE — [place] FL2 (Signed)
[place] MEDICAID FL2 LEVEL OF CARE SCREENING TOOL     IDENTIFICATION  Patient Name: [name] [name] Birthdate: 07-Mar-1941 Sex: female Admission Date (Current Location): 11/15/2017  [place]   Facility and Address:  [facility], [address], [place], [place] [address]      Provider Number: [id number]  Attending Physician Name and Address:  Shaune Pollackhen, Qing, MD  Relative Name and Phone Number:     [name] CitizenSteven Baumgartner [phone]  Current Level of Care: Hospital Recommended Level of Care: Memory Care Prior Approval Number:    Date Approved/Denied:   PASRR Number: [id number]  Discharge Plan: Domiciliary (Rest home)    Current Diagnoses: Patient Active Problem List   Diagnosis Date Noted  . Sepsis (HCC) 11/15/2017  . Acute renal failure (ARF) (HCC) 11/15/2017  . Encephalopathy acute 07/29/2017  . Valvular heart disease 06/13/2017  . 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 Coronary artery disease involving native coronary artery of native heart without angina pectoris 06/16/2015  . Aspiration pneumonia (HCC) 06/09/2015  . Atrial flutter (HCC) 06/07/2015  . Hypokalemia 06/07/2015  . Non-occlusive coronary artery disease 06/07/2015  . Acute upper gastrointestinal bleeding   . Erosive gastritis with hemorrhage   . 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 H/O prosthetic heart valve 03/27/2015  . Insomnia 03/27/2015  . 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 BP (high blood pressure) 03/27/2015  . H/O transient cerebral ischemia 03/27/2015  . Arthritis, degenerative 03/27/2015  . Closed fracture of hip (HCC) 03/27/2015  . Anxiety 03/27/2015  . Hyperlipemia 03/27/2015  . Depression 10/17/2014  . Dementia 05/06/2014  . Fracture of femoral neck, left (HCC) 05/06/2014  . Displaced fracture of left femoral neck (HCC) 05/05/2014  . Encounter for therapeutic drug monitoring 06/09/2013  . Carotid stenosis 04/08/2011  .  Transient ischemic attack (TIA) 08/07/2010  . TOBACCO ABUSE 09/21/2008  . Essential hypertension, benign 09/21/2008  . PAF (paroxysmal atrial fibrillation) (HCC) 09/21/2008    Orientation RESPIRATION BLADDER Height & Weight     Self  Normal Incontinent Weight: 130 lb (59 kg) Height:  5\' 2"  (157.5 cm)  BEHAVIORAL SYMPTOMS/MOOD NEUROLOGICAL BOWEL NUTRITION STATUS      Incontinent Diet(soft foods)  AMBULATORY STATUS COMMUNICATION OF NEEDS Skin   Extensive Assist Non-Verbally Normal                       Personal Care Assistance Level of Assistance  Bathing, Feeding, Dressing, Total care Bathing Assistance: Limited assistance Feeding assistance: Limited assistance Dressing Assistance: Limited assistance Total Care Assistance: Limited assistance   Functional Limitations Info  Sight, Hearing, Speech Sight Info: Adequate Hearing Info: Adequate Speech Info: Impaired(Mostly non verbal ( last month))    SPECIAL CARE FACTORS FREQUENCY                       Contractures Contractures Info: Not present    Additional Factors Info  Code Status, Allergies Code Status Info: DNR Allergies Info: Coedine, asparin and penicilan           Current Medications (11/16/2017):  This is the current hospital active medication list Current Facility-Administered Medications  Medication Dose Route Frequency Provider Last Rate Last Dose  . 0.45 % sodium chloride infusion   Intravenous Continuous Altamese DillingVachhani, Vaibhavkumar, MD 100 mL/hr at 11/16/17 0650    . amiodarone (PACERONE) tablet 200 mg  200 mg Oral Daily Altamese DillingVachhani, Vaibhavkumar, MD      . atorvastatin (LIPITOR) tablet  40 mg  40 mg Oral Daily Altamese Dilling, MD   40 mg at 11/15/17 2031  . buPROPion (WELLBUTRIN) tablet 75 mg  75 mg Oral q morning - 10a Altamese Dilling, MD      . cefTRIAXone (ROCEPHIN) 1 g in sodium chloride 0.9 % 100 mL IVPB  1 g Intravenous Q24H Altamese Dilling, MD      . docusate sodium (COLACE)  capsule 100 mg  100 mg Oral BID PRN Altamese Dilling, MD      . ferrous sulfate tablet 325 mg  325 mg Oral Q breakfast Altamese Dilling, MD      . heparin injection 5,000 Units  5,000 Units Subcutaneous Q8H Altamese Dilling, MD   5,000 Units at 11/16/17 0650  . potassium chloride SA (K-DUR,KLOR-CON) CR tablet 40 mEq  40 mEq Oral Once Shaune Pollack, MD      . risperiDONE (RISPERDAL) tablet 2 mg  2 mg Oral QHS Altamese Dilling, MD   2 mg at 11/15/17 2031  . vitamin B-12 (CYANOCOBALAMIN) tablet 500 mcg  500 mcg Oral Daily Altamese Dilling, MD      . vitamin C (ASCORBIC ACID) tablet 500 mg  500 mg Oral Daily Altamese Dilling, MD         Discharge Medications: Please see discharge summary for a list of discharge medications.  Relevant Imaging Results:  Relevant Lab Results:   Additional Information SSN [id number]  Cheron Schaumann, [place]

## 2017-11-16 NOTE — Progress Notes (Signed)
PT Cancellation Note  Patient Details Name: [name] SangerCecilia R Schellhase MRN: [id number] DOB: 04-29-1941   Cancelled Treatment:    Reason Eval/Treat Not Completed: Other (comment). Consult received and chart reviewed. Pt confused and unable to follow commands per RN at this time. Not appropriate for PT evaluation. Will re-attempt next date.   Ilee Randleman 11/16/2017, 3:40 PM  Elizabeth PalauStephanie Romir Klimowicz, PT, DPT [phone]

## 2017-11-16 NOTE — Clinical Social Work Note (Signed)
Clinical Social Work Assessment  Patient Details  Name: [name] SangerCecilia R [name] MRN: [id number] Date of Birth: May 17, 1940  Date of referral:  11/15/17               Reason for consult:  Other (Comment Required)(From [facility])                Permission sought to share information with:  Family Supports, Oceanographeracility Contact Representative Permission granted to share information::  Yes, Verbal Permission Granted  Name::     Anastasia PallSteve Cormier [phone]  Efraim KaufmannMelissa ( wife) [phone]  Agency::  [facility]  Relationship::     Contact Information:     Housing/Transportation Living arrangements for the past 2 months:  Skilled Building surveyorursing Facility Source of Information:  Adult Children Patient Interpreter Needed:  None Criminal Activity/Legal Involvement Pertinent to Current Situation/Hospitalization:  No - Comment as needed Significant Relationships:  Adult Children, Church, Spouse Lives with:  Facility Resident Do you feel safe going back to the place where you live?  Yes Need for family participation in patient care:  Yes (Comment)  Care giving concerns: Son and daughter in law are concerned  Mother being over medicated at facility   Social Worker assessment / plan: LCSW introduced myself to patient and son and daughter in law. Patient is married and her husband has [facility] March of this year ( memory care unit) Patient has dementia and required full assistance with all her ADL's. Husband visits his wife daily and supports her with feeding. Patient was alert during assessment but was non verbal- son states she doesn't talk much and hasnt remembered him for about 2 years. Family was concerned because they feel she has been overmedicated at [facility] and LCSW provided information to contact ombudsman/dss. Daughter in law a IT consultantparalegal and stated she herself would find information and the family will access this resource. Patient has very good family support and her husband  visits her daily. As per son and daughter in law they feel she is failing to thrive and seems less awake and not eating, forgets to swallow and chews food repeatedly. Patient is to return to [facility]. No further needs ( Insurance is [employer]/Medicare)  Employment status:  Retired Health and safety inspectornsurance information:  Medicare([employer]) PT Recommendations:  Not assessed at this time Information / Referral to community resources:  Other (Comment Required)(Ombudsman/DSS)  Patient/Family's Response to care:  Glad she is hospital  Patient/Family's Understanding of and Emotional Response to Diagnosis, Current Treatment, and Prognosis:   Family has good understanding  Emotional Assessment Appearance:  Appears stated age Attitude/Demeanor/Rapport:  Sedated Affect (typically observed):  Flat, Guarded, Calm Orientation:  Oriented to Self Alcohol / Substance use:  Not Applicable Psych involvement (Current and /or in the community):  No (Comment)  Discharge Needs  Concerns to be addressed:  No discharge needs identified Readmission within the last 30 days:  No Current discharge risk:  None Barriers to Discharge:  No Barriers Identified   Cheron SchaumannBandi, Khali Perella M, LCSW 11/16/2017, 8:11 AM

## 2017-11-16 NOTE — Progress Notes (Signed)
Spouse requested a Child psychotherapistsocial worker consult to discuss  the care the patient is receiving from SNF. Notified Clydie BraunKaren.

## 2017-11-16 NOTE — Plan of Care (Signed)

## 2017-11-16 NOTE — Clinical Social Work Note (Signed)
CSW received consult that family has additional concerns about the patient's facility. CSW attempted to meet the family at bedside with no success. The CSW will attempt again tomorrow to discuss ongoing concerns.  Argentina PonderKaren Martha Jahzeel Poythress, MSW, Theresia MajorsLCSWA [phone]

## 2017-11-17 LAB — BASIC METABOLIC PANEL
ANION GAP: 2 — AB (ref 5–15)
BUN: 20 mg/dL (ref 8–23)
CO2: 24 mmol/L (ref 22–32)
Calcium: 8 mg/dL — ABNORMAL LOW (ref 8.9–10.3)
Chloride: 121 mmol/L — ABNORMAL HIGH (ref 98–111)
Creatinine, Ser: 0.88 mg/dL (ref 0.44–1.00)
GFR calc Af Amer: >60 mL/min (ref >60)
GLUCOSE: 80 mg/dL (ref 70–99)
POTASSIUM: 3.1 mmol/L — AB (ref 3.5–5.1)
Sodium: 147 mmol/L — ABNORMAL HIGH (ref 135–145)

## 2017-11-17 LAB — PROTIME-INR
INR: 3.06
PROTHROMBIN TIME: 31.4 s — AB (ref 11.4–15.2)

## 2017-11-17 MED ORDER — ENSURE ENLIVE PO LIQD
237.0000 mL | Freq: Three times a day (TID) | ORAL | Status: DC
  Administered 2017-11-17 – 2017-11-19 (×5): 237 mL via ORAL

## 2017-11-17 MED ORDER — POTASSIUM CHLORIDE CRYS ER 20 MEQ PO TBCR
40.0000 meq | EXTENDED_RELEASE_TABLET | Freq: Two times a day (BID) | ORAL | Status: AC
  Administered 2017-11-17 (×2): 40 meq via ORAL
  Filled 2017-11-17 (×2): qty 2

## 2017-11-17 MED ORDER — WARFARIN - PHARMACIST DOSING INPATIENT
Freq: Every day | Status: DC
  Administered 2017-11-18: 17:00:00

## 2017-11-17 MED ORDER — WARFARIN SODIUM 3 MG PO TABS: 3.0000 mg | ORAL_TABLET | Freq: Every day | ORAL | Status: DC

## 2017-11-17 MED ORDER — WARFARIN SODIUM 6 MG PO TABS: 6.0000 mg | ORAL_TABLET | Freq: Once | ORAL | Status: DC

## 2017-11-17 NOTE — Progress Notes (Signed)
[facility] [place] [facility]   PATIENT NAME: [name] [name]    MR#:  [id number]  DATE OF BIRTH:  1941-04-03  SUBJECTIVE:  CHIEF COMPLAINT:   Chief Complaint  Patient presents with  . Altered Mental Status   The patient is unresponsive to stimuli. She was given haldol due to agitation las night per RN. REVIEW OF SYSTEMS:  Review of Systems  Unable to perform ROS: Mental status change    DRUG ALLERGIES:   Allergies  Allergen Reactions  . Codeine Other (See Comments)  . Aspirin     unknown  . Penicillins     Has patient had a PCN reaction causing immediate rash, facial/tongue/throat swelling, SOB or lightheadedness with hypotension: NO Has patient had a PCN reaction causing severe rash involving mucus membranes or skin necrosis: NO Has patient had a PCN reaction that required hospitalization NO Has patient had a PCN reaction occurring within the last 10 years: NO If all of the above answers are "NO", then may proceed with Cephalosporin use.   VITALS:  Blood pressure (!) 112/49, pulse (!) 58, temperature 98.7 F (37.1 C), temperature source Oral, resp. rate 18, height 5\' 2"  (1.575 m), weight 130 lb (59 kg), SpO2 100 %. PHYSICAL EXAMINATION:  Physical Exam  Constitutional: She appears well-developed.  HENT:  Head: Normocephalic.  Eyes: Conjunctivae are normal. No scleral icterus.  Neck: Neck supple. No JVD present. No tracheal deviation present.  Cardiovascular: Normal rate, regular rhythm and normal heart sounds. Exam reveals no gallop.  No murmur heard. Pulmonary/Chest: Effort normal and breath sounds normal. No respiratory distress. She has no wheezes. She has no rales.  Abdominal: Soft. Bowel sounds are normal. She exhibits no distension. There is no tenderness. There is no rebound.  Musculoskeletal: She exhibits no edema or tenderness.  Neurological: No cranial nerve deficit.  Patient is unresponsive to stimuli.  Skin: No rash noted. No  erythema.  Psychiatric:  Demented.   LABORATORY PANEL:  Female CBC Recent Labs  Lab 11/16/17 0637  WBC 11.8*  HGB 10.7*  HCT 32.9*  PLT 215   ------------------------------------------------------------------------------------------------------------------ Chemistries  Recent Labs  Lab 11/15/17 1431 11/16/17 0637 11/17/17 0444  NA 152* 151* 147*  K 3.8 3.2* 3.1*  CL 114* 121* 121*  CO2 27 26 24   GLUCOSE 175* 87 80  BUN 44* 38* 20  CREATININE 2.06* 1.42* 0.88  CALCIUM 9.3 8.0* 8.0*  MG  --  1.7  --   AST 99*  --   --   ALT 79*  --   --   ALKPHOS 118  --   --   BILITOT 1.1  --   --    RADIOLOGY:  No results found. ASSESSMENT AND PLAN:  Raeshawn Tafolla  is a 77 y.o. female with a known history of Anxiety, Dementia- lives in dementia unit in [facility], a fib, Htn, HLD- sent from NH due to altered mental status.   Acute metabolic encephalopathy due to sepsis and UTI. Aspiration fall precaution.  * Sepsis due to UTI (E Coli)  Continue Rocephin IV, leukocytosis improved,Blood culture is negative so far and urine culture: E Coli, f/u sensitivity.  * Acute renal failure due to dehydration.  Improved with half-normal saline IV.  Hold lisinopril and torsemide.  * hypernatremia   Due to dehydration   Continue half-normal saline IV and follow-up BMP.  Hypokalemia.  Continue potassium supplement and follow-up BMP.  * Dementia, Anxiety resumed Xanax.  * HTN  Hold the meds due to Hypotension and sepsis.  All the records are reviewed and case discussed with Care Management/Social Worker. Management plans discussed with the patient's husband and they are in agreement.  CODE STATUS: DNR  TOTAL TIME TAKING CARE OF THIS PATIENT: 33 minutes.   More than 50% of the time was spent in counseling/coordination of care: YES  POSSIBLE D/C IN 2 DAYS, DEPENDING ON CLINICAL CONDITION.   Shaune PollackQing Anahla Bevis M.D on 11/17/2017 at 1:31 PM  Between 7am to 6pm - Pager -  [phone]  After 6pm go to [email or web address] - Therapist, nutritionalpassword EPAS [facility]  [facility] [place] [facility]

## 2017-11-17 NOTE — [place] FL2 (Signed)
[place] MEDICAID FL2 LEVEL OF CARE SCREENING TOOL     IDENTIFICATION  Patient Name: [name] [name] Birthdate: 1941/04/08 Sex: female Admission Date (Current Location): 11/15/2017  [place] Number:  Chiropodist and Address:  [facility], [address] [place], [place] [address]      Provider Number: [id number]  Attending Physician Name and Address:  Shaune Pollack, MD  Relative Name and Phone Number:       Current Level of Care: Hospital Recommended Level of Care: Skilled Nursing Facility Prior Approval Number:    Date Approved/Denied:   PASRR Number: [id number])  Discharge Plan: SNF    Current Diagnoses: Patient Active Problem List   Diagnosis Date Noted  . Sepsis (HCC) 11/15/2017  . Acute renal failure (ARF) (HCC) 11/15/2017  . Encephalopathy acute 07/29/2017  . Valvular heart disease 06/13/2017  . 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 Coronary artery disease involving native coronary artery of native heart without angina pectoris 06/16/2015  . Aspiration pneumonia (HCC) 06/09/2015  . Atrial flutter (HCC) 06/07/2015  . Hypokalemia 06/07/2015  . Non-occlusive coronary artery disease 06/07/2015  . Acute upper gastrointestinal bleeding   . Erosive gastritis with hemorrhage   . 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 H/O prosthetic heart valve 03/27/2015  . Insomnia 03/27/2015  . 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 BP (high blood pressure) 03/27/2015  . H/O transient cerebral ischemia 03/27/2015  . Arthritis, degenerative 03/27/2015  . Closed fracture of hip (HCC) 03/27/2015  . Anxiety 03/27/2015  . Hyperlipemia 03/27/2015  . Depression 10/17/2014  . Dementia 05/06/2014  . Fracture of femoral neck, left (HCC) 05/06/2014  . Displaced fracture of left femoral neck (HCC) 05/05/2014  . Encounter for therapeutic drug monitoring 06/09/2013  . Carotid stenosis 04/08/2011  . Transient ischemic attack (TIA)  08/07/2010  . TOBACCO ABUSE 09/21/2008  . Essential hypertension, benign 09/21/2008  . PAF (paroxysmal atrial fibrillation) (HCC) 09/21/2008    Orientation RESPIRATION BLADDER Height & Weight     Self  O2(2 Lites Oxygen. ) Continent Weight: 136 lb 4.8 oz (61.8 kg) Height:  5\' 2"  (157.5 cm)  BEHAVIORAL SYMPTOMS/MOOD NEUROLOGICAL BOWEL NUTRITION STATUS      Incontinent Diet(Diet: Heart Healthy )  AMBULATORY STATUS COMMUNICATION OF NEEDS Skin   Extensive Assist Verbally PU Stage and Appropriate Care(pressure ulcer stage 1 on sacrum. pressure ulcer stage 2 on buttocks. )                       Personal Care Assistance Level of Assistance  Bathing, Feeding, Dressing Bathing Assistance: Limited assistance Feeding assistance: Independent Dressing Assistance: Limited assistance Total Care Assistance: Limited assistance   Functional Limitations Info  Sight, Hearing, Speech Sight Info: Adequate Hearing Info: Adequate Speech Info: Adequate    SPECIAL CARE FACTORS FREQUENCY                       Contractures Contractures Info: Not present    Additional Factors Info  Code Status, Allergies Code Status Info: (DNR ) Allergies Info: (Codeine, Aspirin, Penicillins)           Current Medications (11/17/2017):  This is the current hospital active medication list Current Facility-Administered Medications  Medication Dose Route Frequency Provider Last Rate Last Dose  . 0.45 % sodium chloride infusion   Intravenous Continuous Altamese Dilling, MD 100 mL/hr at 11/17/17 1345    . ALPRAZolam (XANAX) tablet 0.5 mg  0.5 mg Oral Q6H  PRN Shaune Pollackhen, Qing, MD      . amiodarone (PACERONE) tablet 200 mg  200 mg Oral Daily Altamese DillingVachhani, Vaibhavkumar, MD   200 mg at 11/17/17 0901  . atorvastatin (LIPITOR) tablet 40 mg  40 mg Oral Daily Altamese DillingVachhani, Vaibhavkumar, MD   40 mg at 11/16/17 2111  . buPROPion [facility](WELLBUTRIN) tablet 75 mg  75 mg Oral q morning - 10a Altamese DillingVachhani, Vaibhavkumar, MD   75 mg at  11/17/17 0901  . cefTRIAXone (ROCEPHIN) 1 g in sodium chloride 0.9 % 100 mL IVPB  1 g Intravenous Q24H Altamese DillingVachhani, Vaibhavkumar, MD   Stopped at 11/16/17 1853  . docusate sodium (COLACE) capsule 100 mg  100 mg Oral BID PRN Altamese DillingVachhani, Vaibhavkumar, MD      . escitalopram (LEXAPRO) tablet 20 mg  20 mg Oral Daily Shaune Pollackhen, Qing, MD   20 mg at 11/17/17 0900  . feeding supplement (ENSURE ENLIVE) (ENSURE ENLIVE) liquid 237 mL  237 mL Oral TID BM Shaune Pollackhen, Qing, MD      . ferrous sulfate tablet 325 mg  325 mg Oral Q breakfast Altamese DillingVachhani, Vaibhavkumar, MD   325 mg at 11/17/17 0901  . haloperidol (HALDOL) tablet 1 mg  1 mg Oral Q6H PRN Shaune Pollackhen, Qing, MD   1 mg at 11/16/17 2329  . heparin injection 5,000 Units  5,000 Units Subcutaneous Q8H Altamese DillingVachhani, Vaibhavkumar, MD   5,000 Units at 11/17/17 1400  . potassium chloride SA (K-DUR,KLOR-CON) CR tablet 40 mEq  40 mEq Oral BID Shaune Pollackhen, Qing, MD   40 mEq at 11/17/17 0901  . QUEtiapine (SEROQUEL) tablet 25 mg  25 mg Oral QHS Shaune Pollackhen, Qing, MD   25 mg at 11/16/17 2112  . risperiDONE (RISPERDAL) tablet 2 mg  2 mg Oral QHS Altamese DillingVachhani, Vaibhavkumar, MD   2 mg at 11/16/17 2111  . traZODone (DESYREL) tablet 100 mg  100 mg Oral QHS Shaune Pollackhen, Qing, MD   100 mg at 11/16/17 2111  . vitamin B-12 (CYANOCOBALAMIN) tablet 500 mcg  500 mcg Oral Daily Altamese DillingVachhani, Vaibhavkumar, MD   500 mcg at 11/17/17 0901  . vitamin C (ASCORBIC ACID) tablet 500 mg  500 mg Oral Daily Altamese DillingVachhani, Vaibhavkumar, MD   500 mg at 11/17/17 0901     Discharge Medications: Please see discharge summary for a list of discharge medications.  Relevant Imaging Results:  Relevant Lab Results:   Additional Information (SSN: [id number])  Naveah Brave, Darleen CrockerBailey M, LCSW

## 2017-11-17 NOTE — Progress Notes (Addendum)
ANTICOAGULATION CONSULT NOTE - Initial Consult  Pharmacy Consult for warfarin Indication: atrial fibrillation  Allergies  Allergen Reactions  . Codeine Other (See Comments)  . Aspirin     unknown  . Penicillins     Has patient had a PCN reaction causing immediate rash, facial/tongue/throat swelling, SOB or lightheadedness with hypotension: NO Has patient had a PCN reaction causing severe rash involving mucus membranes or skin necrosis: NO Has patient had a PCN reaction that required hospitalization NO Has patient had a PCN reaction occurring within the last 10 years: NO If all of the above answers are "NO", then may proceed with Cephalosporin use.    Patient Measurements: Height: 5\' 2"  (157.5 cm) Weight: 136 lb 4.8 oz (61.8 kg) IBW/kg (Calculated) : 50.1 Heparin Dosing Weight:   Vital Signs: Temp: 98.6 F (37 C) (07/08 1432) Temp Source: Oral (07/08 1432) BP: 97/44 (07/08 1432) Pulse Rate: 83 (07/08 1432)  Labs: Recent Labs    11/15/17 1431 11/16/17 0637 11/17/17 0444  HGB 12.3 10.7*  --   HCT 37.7 32.9*  --   PLT 270 215  --   LABPROT 26.9*  --   --   INR 2.51  --   --   CREATININE 2.06* 1.42* 0.88  TROPONINI 0.03*  --   --     Estimated Creatinine Clearance: 46.3 mL/min (by C-G formula based on SCr of 0.88 mg/dL).   Medical History: Past Medical History:  Diagnosis Date  . Anxiety   . Carotid stenosis    a. Carotid US (9/14):  Bilat 1-39% => f/u PRN  . Dementia   . Depression   . Gastroesophageal reflux disease   . History of echocardiogram    Echo 2/17: mild LVH, EF 55-60%, mild to mod AS (mean 14 mmHg), mechanical MVR ok (mean 7 mmHgg), mild LAE, PASP 58 mmHg  . HLD (hyperlipidemia)   . HTN (hypertension)   . Mitral regurgitation    a. s/p Bjork Shiley mechanical mitral valve replacement in 1980; b. 08/2016 Echo: EF 60-65%, no rwma, mild AS, mod dil LA, no signif MR, PASP 60mmHg.  . 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 non-obstructive  by cath 2007.  [name]. PAF (paroxysmal atrial fibrillation) (HCC)    on chronic coumadin  . Paroxysmal atrial flutter (HCC)    s/p RFA x 2  . TIA (transient ischemic attack)   . Tobacco abuse     Medications:  Infusions:  . sodium chloride 100 mL/hr at 11/17/17 1345  . cefTRIAXone (ROCEPHIN)  IV Stopped (11/16/17 1853)    Assessment: [age] yof with AMS being treated with rocephin for UTI. Pharmacy consulted to manage VKA while IP.   Goal of Therapy:  INR 2-3 Monitor platelets by anticoagulation protocol: Yes   Plan:  Check baseline INR. If therapeutic or subtherapeutic resume home dosing of 3 mg po daily and check INR daily while on antibiotics.   F/U: Baseline INR is supratherapeutic at 3.06. Will schedule first dose for 7/9 at 1800. However, 7/9 dose may be held or adjusted based on the INR tomorrow morning.  Burnis Medinodney Elanah Osmanovic, PharmD Clinical Pharmacist 11/17/2017,3:05 PM

## 2017-11-17 NOTE — Progress Notes (Signed)
[facility] at [facility] patient is a long term care SNF resident and can return when stable. Per MD patient may D/C tomorrow. Verlon AuLeslie is aware of above.   B[employer] Hughes IncorporatedBailey Nasiah Polinsky, LCSW [phone]

## 2017-11-17 NOTE — Progress Notes (Signed)
PT Cancellation Note  Patient Details Name: [name] [name] MRN: [id number] DOB: 17-May-1940   Cancelled Treatment:    Reason Eval/Treat Not Completed: Other (comment). Consult received and chart reviewed. Per RN, pt still unable to participate in therapy as she is unable to follow commands due to severe dementia. Not appropriate for PT intervention at this time. Will re-attempt next date if pt able to participate.    Kylan Veach 11/17/2017, 10:24 AM  Elizabeth PalauStephanie Burke Terry, PT, DPT [phone]

## 2017-11-17 NOTE — Plan of Care (Signed)

## 2017-11-17 NOTE — Plan of Care (Signed)
  Problem: Clinical Measurements: Goal: Will remain free from infection Outcome: Progressing Goal: Diagnostic test results will improve Outcome: Progressing Goal: Respiratory complications will improve Outcome: Progressing Goal: Cardiovascular complication will be avoided Outcome: Progressing   

## 2017-11-17 NOTE — Progress Notes (Signed)
Initial Nutrition Assessment  DOCUMENTATION CODES:   Non-severe (moderate) malnutrition in context of chronic illness  INTERVENTION:   - Ensure Enlive po TID, each supplement provides 350 kcal and 20 grams of protein  NUTRITION DIAGNOSIS:   Moderate Malnutrition related to chronic illness(dementia) as evidenced by mild fat depletion, moderate fat depletion, mild muscle depletion, moderate muscle depletion, percent weight loss(14% weight loss in 4 months).  GOAL:   Patient will meet greater than or equal to 90% of their needs  MONITOR:   PO intake, Supplement acceptance, Weight trends, Skin, Labs, I & O's  REASON FOR ASSESSMENT:   Malnutrition Screening Tool    ASSESSMENT:   77 year old female who presented to the ED from [facility] with AMS. PMH significant for dementia, atrial fibrillation on Coumadin, GERD, hypertension, hyperlipidemia, and CAD.  Spoke with pt and husband at bedside. Pt somewhat agitated at time but able to respond to most simple questions when asked. Obtained majority of interview information from pt's husband.  Pt's husband states that pt does not have much of an appetite. Pt's husband reports that he goes to [facility] for most meals and feeds pt. Pt's husband doubts that pt is given feeding assistance when he is not there. Pt eats approximately half of what is plated at mealtimes when pt's husband is there.  Pt's husband reports that pt used to drink chocolate Ensure prior to living at [facility]. RD to order TID between meals to maximize kcal and protein intake and promote wound healing.  Pt's husband states pt just woke up and missed breakfast this morning. Pt's husband is concerned that pt is being overmedicated at the SNF where she resided.  Pt's husband reports that pt weighed 174 lbs when she was admitted to [facility] in March 2019. Pt's husband endorses weight loss. Bed weight obtained at time of visit: 136.6 lbs. Per weight  history in chart, pt has lost 22.2 lbs since 07/16/17. This is a 14% weight loss in 4 months which is significant for timeframe.  Pt's husband preparing to feed pt lunch as RD was leaving.  Meal Completion: 30%  Medications reviewed and include: 325 mg ferrous sulfate daily, 40 mEq K-dur x 2 today, 500 mcg vitamin B-12 daily, 500 mg vitamin C daily  Labs reviewed: sodium 147 (H), potassium 3.1 (L), chloride 121 (H), hemoglobin 10.7 (L), HCT 32.9 (L)  UOP: 1100 x 24 hours  NUTRITION - FOCUSED PHYSICAL EXAM:    Most Recent Value  Orbital Region  Mild depletion  Upper Arm Region  Moderate depletion  Thoracic and Lumbar Region  Mild depletion  Buccal Region  Mild depletion  Temple Region  Mild depletion  Clavicle Bone Region  Mild depletion  Clavicle and Acromion Bone Region  Moderate depletion  Scapular Bone Region  Unable to assess  Dorsal Hand  Mild depletion  Patellar Region  Moderate depletion  Anterior Thigh Region  Moderate depletion  Posterior Calf Region  Moderate depletion  Edema (RD Assessment)  None  Hair  Reviewed  Eyes  Reviewed  Mouth  Reviewed  Skin  Reviewed  Nails  Reviewed       Diet Order:   Diet Order           Diet Heart Room service appropriate? Yes; Fluid consistency: Thin  Diet effective now          EDUCATION NEEDS:   No education needs have been identified at this time  Skin:  Skin Assessment: Skin Integrity  Issues: Skin Integrity Issues:: Stage I, Stage II Stage I: sacrum Stage II: buttocks  Last BM:  11/16/17 medium type 4  Height:   Ht Readings from Last 1 Encounters:  11/15/17 5\' 2"  (1.575 m)    Weight:   Wt Readings from Last 1 Encounters:  11/17/17 136 lb 4.8 oz (61.8 kg)    Ideal Body Weight:  50 kg  BMI:  Body mass index is 24.93 kg/m.  Estimated Nutritional Needs:   Kcal:  1550-1750 kcal/day  Protein:  75-90 grams/day  Fluid:  1.6-1.8 L/day    Earma ReadingKate Jablonski Jamillah Camilo, MS, RD, LDN Pager:  [phone] Weekend/After Hours: [phone]

## 2017-11-18 DIAGNOSIS — L8990 Pressure ulcer of unspecified site, unspecified stage: Secondary | ICD-10-CM

## 2017-11-18 DIAGNOSIS — E440 Moderate protein-calorie malnutrition: Secondary | ICD-10-CM

## 2017-11-18 LAB — BASIC METABOLIC PANEL
ANION GAP: 2 — AB (ref 5–15)
BUN: 11 mg/dL (ref 8–23)
CHLORIDE: 119 mmol/L — AB (ref 98–111)
CO2: 23 mmol/L (ref 22–32)
Calcium: 8.1 mg/dL — ABNORMAL LOW (ref 8.9–10.3)
Creatinine, Ser: 0.69 mg/dL (ref 0.44–1.00)
GFR calc Af Amer: >60 mL/min (ref >60)
GFR calc non Af Amer: >60 mL/min (ref >60)
GLUCOSE: 93 mg/dL (ref 70–99)
POTASSIUM: 4.2 mmol/L (ref 3.5–5.1)
Sodium: 144 mmol/L (ref 135–145)

## 2017-11-18 LAB — URINE CULTURE

## 2017-11-18 LAB — PROTIME-INR
INR: 2.66
PROTHROMBIN TIME: 28.1 s — AB (ref 11.4–15.2)

## 2017-11-18 LAB — MAGNESIUM: Magnesium: 1.8 mg/dL (ref 1.7–2.4)

## 2017-11-18 MED ORDER — WARFARIN SODIUM 2 MG PO TABS
2.0000 mg | ORAL_TABLET | Freq: Every day | ORAL | Status: DC
  Administered 2017-11-18: 17:00:00 2 mg via ORAL
  Filled 2017-11-18 (×2): qty 1

## 2017-11-18 MED ORDER — SODIUM CHLORIDE 0.9 % IV SOLN
1.0000 g | Freq: Three times a day (TID) | INTRAVENOUS | Status: DC
  Administered 2017-11-18 – 2017-11-19 (×3): 1 g via INTRAVENOUS
  Filled 2017-11-18 (×4): qty 1

## 2017-11-18 NOTE — Progress Notes (Signed)
Clinical Social Worker (CSW) met with patient's husband and son Richardson Landry at their request. Husband asked about patient going to another long term care facility. CSW explained that long term care beds are limited and patient will have to D/C back to [facility]. CSW explained to husband and son that the social worker at [facility] can try to find another facility or put her on a waiting list. CSW also encouraged husband and son to meet with Environmental consultant of nursing and discuss their concerns. Husband and son verbalized their understanding and thanked CSW for visit.   [employer], LCSW [phone]

## 2017-11-18 NOTE — Care Management Important Message (Signed)
Important Message  Patient Details  Name: [name] [name] MRN: [id number] Date of Birth: 15-Nov-1940   Medicare Important Message Given:  Yes    [name] [name] 11/18/2017, 10:34 AM

## 2017-11-18 NOTE — Progress Notes (Signed)
Clinical Child psychotherapistocial Worker (CSW) started [facility] request. CS[facility] aware that patient may D/C in 1-2 days per MD.   Fredric MareBailey Rayvn Rickerson, LCSW [phone]

## 2017-11-18 NOTE — Plan of Care (Signed)
  Problem: Education: Goal: Knowledge of General Education information will improve Outcome: Progressing   Problem: Health Behavior/Discharge Planning: Goal: Ability to manage health-related needs will improve Outcome: Progressing   Problem: Clinical Measurements: Goal: Ability to maintain clinical measurements within normal limits will improve Outcome: Progressing Goal: Will remain free from infection Outcome: Progressing Goal: Diagnostic test results will improve Outcome: Progressing Goal: Respiratory complications will improve Outcome: Progressing Goal: Cardiovascular complication will be avoided Outcome: Progressing   Problem: Activity: Goal: Risk for activity intolerance will decrease Outcome: Progressing   Problem: Nutrition: Goal: Adequate nutrition will be maintained Outcome: Progressing   Problem: Coping: Goal: Level of anxiety will decrease Outcome: Progressing   Problem: Elimination: Goal: Will not experience complications related to bowel motility Outcome: Progressing Goal: Will not experience complications related to urinary retention Outcome: Progressing   Problem: Pain Managment: Goal: General experience of comfort will improve Outcome: Progressing   Problem: Safety: Goal: Ability to remain free from injury will improve Outcome: Progressing   Problem: Skin Integrity: Goal: Risk for impaired skin integrity will decrease Outcome: Progressing Abx changed to merropenum on isolation for esbl in unine

## 2017-11-18 NOTE — Evaluation (Signed)
Physical Therapy Evaluation Patient Details Name: TANICIA WOLAVER MRN: [id number] DOB: [birth year] Today's Date: 11/18/2017   History of Present Illness  Pt is a 77 y.o. female presenting to hospital with AMS/unresponsive (CPR initiated at facility but terminated when EMS arrived).  Pt admitted with sepsis, UTI, acute renal failure, and hypernatremia.  PMH includes dementia, a-fib on Coumadin, anxiety, htn, TIA, aspiration PNA, MVR, and h/o L hip arthroplasty.  Clinical Impression  Per chart pt is a long term care resident (memory care) at facility since March 2019.  Contacted SW regarding pt's mobility status and SW reporting mobility status not known.  Attempted to contact pt's facility but first attempt after call was transferred therapist was disconnected; 2nd attempt after call transferred again no one answered.   Pt oriented to self only.  Inconsistent with following commands.  Minimal initiation with functional activities.  Pt was intermittently verbally conversational.  Currently pt is 1-2 assist with bed mobility (assist required d/t pt not initiating movement to perform activity) but pt was able to sit on edge of bed x15 minutes (pt requesting to just "sit" and pt did not want to do anything else; mild increased R lean noted with fatigue sitting edge of bed) with close SBA to CGA.  No c/o pain.  Pt would benefit from skilled PT to address noted impairments and functional limitations during hospital stay (see below for any additional details).  Upon hospital discharge, recommend pt discharge back to facility with 24/7 assist; d/t pt's difficulty with participating in PT, no further PT needs anticipated upon hospital discharge.    Follow Up Recommendations No PT follow up;Supervision/Assistance - 24 hour(return to facility)    Equipment Recommendations  None recommended by PT    Recommendations for Other Services       Precautions / Restrictions Precautions Precautions:  Fall Restrictions Weight Bearing Restrictions: No      Mobility  Bed Mobility Overal bed mobility: Needs Assistance Bed Mobility: Supine to Sit;Sit to Supine     Supine to sit: Mod assist;HOB elevated Sit to supine: Min assist;Mod assist;+2 for physical assistance;HOB elevated   General bed mobility comments: assist for LE's and trunk supine to/from sit; 2 assist to boost pt up in bed  Transfers                 General transfer comment: Deferred (pt initially did not want to put her feet on the ground when sitting edge of bed and then pt requesting to just "sit" on edge of bed)  Ambulation/Gait             General Gait Details: Deferred (pt requesting to just "sit" on edge of bed)  Stairs            Wheelchair Mobility    Modified Rankin (Stroke Patients Only)       Balance Overall balance assessment: Needs assistance Sitting-balance support: No upper extremity supported;Feet supported Sitting balance-Leahy Scale: Fair Sitting balance - Comments: steady static sitting no UE support                                     Pertinent Vitals/Pain Pain Assessment: No/denies pain    Home Living Family/patient expects to be discharged to:: Skilled nursing facility                 Additional Comments: Per chart pt is a long term care resident (  memory care) at [facility] since March 2019.    Prior Function Level of Independence: Needs assistance   Gait / Transfers Assistance Needed: when therapist asked pt if she was able to walk pt stated "I hope so"  ADL's / Homemaking Assistance Needed: Per chart pt is full assist with ADL's and doesn't talk much.        Hand Dominance        Extremity/Trunk Assessment   Upper Extremity Assessment Upper Extremity Assessment: Generalized weakness    Lower Extremity Assessment Lower Extremity Assessment: Generalized weakness    Cervical / Trunk Assessment Cervical / Trunk  Assessment: Normal  Communication   Communication: No difficulties  Cognition Arousal/Alertness: Awake/alert Behavior During Therapy: WFL for tasks assessed/performed Overall Cognitive Status: (Oriented to name only)                                 General Comments: Inconsistent with following one step commands      General Comments General comments (skin integrity, edema, etc.): Pt resting in bed with B hand mitts donned upon PT arrival.  Nursing cleared pt for participation in physical therapy.  Pt agreeable to PT session.    Exercises     Assessment/Plan    PT Assessment Patient needs continued PT services  PT Problem List Decreased strength;Decreased activity tolerance;Decreased balance;Decreased mobility       PT Treatment Interventions DME instruction;Gait training;Functional mobility training;Therapeutic activities;Therapeutic exercise;Balance training;Patient/family education    PT Goals (Current goals can be found in the Care Plan section)  Acute Rehab PT Goals Patient Stated Goal: to "sit" on edge of bed PT Goal Formulation: With patient Time For Goal Achievement: 12/02/17 Potential to Achieve Goals: Good    Frequency Min 2X/week   Barriers to discharge        Co-evaluation               AM-PAC PT "6 Clicks" Daily Activity  Outcome Measure Difficulty turning over in bed (including adjusting bedclothes, sheets and blankets)?: Unable Difficulty moving from lying on back to sitting on the side of the bed? : Unable Difficulty sitting down on and standing up from a chair with arms (e.g., wheelchair, bedside commode, etc,.)?: Unable Help needed moving to and from a bed to chair (including a wheelchair)?: A Lot Help needed walking in hospital room?: Total Help needed climbing 3-5 steps with a railing? : Total 6 Click Score: 7    End of Session Equipment Utilized During Treatment: Oxygen Activity Tolerance: Patient tolerated treatment  well Patient left: in bed;with call bell/phone within reach;with bed alarm set;with nursing/sitter in room Nurse Communication: Mobility status;Precautions PT Visit Diagnosis: Other abnormalities of gait and mobility (R26.89);Muscle weakness (generalized) (M62.81);History of falling (Z91.81)    Time: [id number] PT Time Calculation (min) (ACUTE ONLY): 27 min   Charges:   PT Evaluation $PT Eval Low Complexity: 1 Low PT Treatments $Therapeutic Exercise: 8-22 mins   PT G CodesHendricks Limes:       Treasure Ingrum, PT 11/18/17, 11:50 AM [phone]

## 2017-11-18 NOTE — Progress Notes (Addendum)
[facility] - [place] [facility]   PATIENT NAME: [name] [name] Loeb    MR#:  [id number]  DATE OF BIRTH:  Jan 12, 1941  SUBJECTIVE:  CHIEF COMPLAINT:   Chief Complaint  Patient presents with  . Altered Mental Status   The patient is more awake but demented. Fever 100.5 this am. REVIEW OF SYSTEMS:  Review of Systems  Unable to perform ROS: Dementia    DRUG ALLERGIES:   Allergies  Allergen Reactions  . Codeine Other (See Comments)  . Aspirin     unknown  . Penicillins     Has patient had a PCN reaction causing immediate rash, facial/tongue/throat swelling, SOB or lightheadedness with hypotension: NO Has patient had a PCN reaction causing severe rash involving mucus membranes or skin necrosis: NO Has patient had a PCN reaction that required hospitalization NO Has patient had a PCN reaction occurring within the last 10 years: NO If all of the above answers are "NO", then may proceed with Cephalosporin use.   VITALS:  Blood pressure (!) 104/58, pulse 85, temperature (!) 100.5 F (38.1 C), temperature source Oral, resp. rate 19, height 5\' 2"  (1.575 m), weight 136 lb 4.8 oz (61.8 kg), SpO2 98 %. PHYSICAL EXAMINATION:  Physical Exam  Constitutional: She appears well-developed.  HENT:  Head: Normocephalic.  Eyes: Conjunctivae are normal. No scleral icterus.  Neck: Neck supple. No JVD present. No tracheal deviation present.  Cardiovascular: Normal rate, regular rhythm and normal heart sounds. Exam reveals no gallop.  No murmur heard. Pulmonary/Chest: Effort normal and breath sounds normal. No respiratory distress. She has no wheezes. She has no rales.  Abdominal: Soft. Bowel sounds are normal. She exhibits no distension. There is no tenderness. There is no rebound.  Musculoskeletal: She exhibits no edema or tenderness.  Neurological: No cranial nerve deficit.  Skin: No rash noted. No erythema.  Psychiatric:  Demented.   LABORATORY PANEL:   Female CBC Recent Labs  Lab 11/16/17 0637  WBC 11.8*  HGB 10.7*  HCT 32.9*  PLT 215   ------------------------------------------------------------------------------------------------------------------ Chemistries  Recent Labs  Lab 11/15/17 1431  11/18/17 0545  NA 152*   < > 144  K 3.8   < > 4.2  CL 114*   < > 119*  CO2 27   < > 23  GLUCOSE 175*   < > 93  BUN 44*   < > 11  CREATININE 2.06*   < > 0.69  CALCIUM 9.3   < > 8.1*  MG  --    < > 1.8  AST 99*  --   --   ALT 79*  --   --   ALKPHOS 118  --   --   BILITOT 1.1  --   --    < > = values in this interval not displayed.   RADIOLOGY:  No results found. ASSESSMENT AND PLAN:  [name] [name]  is a 77 y.o. female with a known history of Anxiety, Dementia- lives in dementia unit in [facility], a fib, Htn, HLD- sent from NH due to altered mental status.   Acute metabolic encephalopathy due to sepsis and UTI. Aspiration fall precaution. Improving.  * Sepsis due to UTI (ESBL) Leukocytosis improved,Blood culture is negative so far and urine culture:ESBL. Disontinued Rocephin IV, started meropenem IV.  Contact isolation.  * Acute renal failure due to dehydration.  Improved with half-normal saline IV.  Hold lisinopril and torsemide.  * hypernatremia   Due to dehydration  Improved with  half-normal saline IV.  Hypokalemia.  Improved with potassium supplement.  * Dementia, Anxiety resumed Xanax prn.  * HTN   Hold the meds due to Hypotension and sepsis.  Chronic A. fib.  Continue amiodarone and Coumadin.  All the records are reviewed and case discussed with Care Management/Social Worker. Management plans discussed with the patient's husband and they are in agreement.  CODE STATUS: DNR  TOTAL TIME TAKING CARE OF THIS PATIENT: 33 minutes.   More than 50% of the time was spent in counseling/coordination of care: YES  POSSIBLE D/C IN 2 DAYS, DEPENDING ON CLINICAL CONDITION.   Shaune Pollack M.D on 11/18/2017 at  12:20 PM  Between 7am to 6pm - Pager - [phone]  After 6pm go to [email or web address] - Therapist, nutritional Hospitalists

## 2017-11-18 NOTE — Progress Notes (Signed)
ANTICOAGULATION CONSULT NOTE - Initial Consult  Pharmacy Consult for warfarin Indication: atrial fibrillation  Allergies  Allergen Reactions  . Codeine Other (See Comments)  . Aspirin     unknown  . Penicillins     Has patient had a PCN reaction causing immediate rash, facial/tongue/throat swelling, SOB or lightheadedness with hypotension: NO Has patient had a PCN reaction causing severe rash involving mucus membranes or skin necrosis: NO Has patient had a PCN reaction that required hospitalization NO Has patient had a PCN reaction occurring within the last 10 years: NO If all of the above answers are "NO", then may proceed with Cephalosporin use.    Patient Measurements: Height: 5\' 2"  (157.5 cm) Weight: 136 lb 4.8 oz (61.8 kg) IBW/kg (Calculated) : 50.1 Heparin Dosing Weight:   Vital Signs: Temp: 99.5 F (37.5 C) (07/09 0504) Temp Source: Oral (07/09 0504) BP: 99/50 (07/09 0504) Pulse Rate: 90 (07/09 0504)  Labs: Recent Labs    11/15/17 1431 11/16/17 0637 11/17/17 0444 11/17/17 1518 11/18/17 0545  HGB 12.3 10.7*  --   --   --   HCT 37.7 32.9*  --   --   --   PLT 270 215  --   --   --   LABPROT 26.9*  --   --  31.4* 28.1*  INR 2.51  --   --  3.06 2.66  CREATININE 2.06* 1.42* 0.88  --  0.69  TROPONINI 0.03*  --   --   --   --     Estimated Creatinine Clearance: 50.9 mL/min (by C-G formula based on SCr of 0.69 mg/dL).   Medical History: Past Medical History:  Diagnosis Date  . Anxiety   . Carotid stenosis    a. Carotid US (9/14):  Bilat 1-39% => f/u PRN  . Dementia   . Depression   . Gastroesophageal reflux disease   . History of echocardiogram    Echo 2/17: mild LVH, EF 55-60%, mild to mod AS (mean 14 mmHg), mechanical MVR ok (mean 7 mmHgg), mild LAE, PASP 58 mmHg  . HLD (hyperlipidemia)   . HTN (hypertension)   . Mitral regurgitation    a. s/p Bjork Shiley mechanical mitral valve replacement in 1980; b. 08/2016 Echo: EF 60-65%, no rwma, mild AS, mod dil  LA, no signif MR, PASP 60mmHg.  . 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 non-obstructive by cath 2007.  [name]. PAF (paroxysmal atrial fibrillation) (HCC)    on chronic coumadin  . Paroxysmal atrial flutter (HCC)    s/p RFA x 2  . TIA (transient ischemic attack)   . Tobacco abuse     Medications:  Infusions:  . sodium chloride 100 mL/hr at 11/18/17 0100  . cefTRIAXone (ROCEPHIN)  IV Stopped (11/17/17 1939)    Assessment: [age] yof with AMS being treated with rocephin for UTI. Pharmacy consulted to manage VKA while IP.   DATE INR DOSE 7/6 2.51 Held 7/7 N/A Held 7/8 3.06 Held 7/9 2.66   Goal of Therapy:  INR 2-3 Monitor platelets by anticoagulation protocol: Yes   Plan:  INR is now therapeutic even though dose has been held for several days. Will resume lower dose (warfarin 2 mg po daily) and check INR daily while on antibiotics.  Ailene Royal A. [place] Bay[place]son, VermontPharm.D., BCPS Clinical Pharmacist 11/18/2017,7:33 AM

## 2017-11-19 LAB — PROTIME-INR
INR: 1.84
PROTHROMBIN TIME: 21.1 s — AB (ref 11.4–15.2)

## 2017-11-19 MED ORDER — FOSFOMYCIN TROMETHAMINE 3 G PO PACK
3.0000 g | PACK | ORAL | Status: DC
  Filled 2017-11-19: qty 3

## 2017-11-19 MED ORDER — FOSFOMYCIN TROMETHAMINE 3 G PO PACK
3.0000 g | PACK | Freq: Once | ORAL | Status: AC
  Administered 2017-11-19: 3 g via ORAL
  Filled 2017-11-19: qty 3

## 2017-11-19 NOTE — Plan of Care (Signed)
  Problem: Education: Goal: Knowledge of General Education information will improve Outcome: Progressing   Problem: Health Behavior/Discharge Planning: Goal: Ability to manage health-related needs will improve Outcome: Progressing   Problem: Clinical Measurements: Goal: Ability to maintain clinical measurements within normal limits will improve Outcome: Progressing Goal: Will remain free from infection Outcome: Progressing Goal: Diagnostic test results will improve Outcome: Progressing Goal: Respiratory complications will improve Outcome: Progressing Goal: Cardiovascular complication will be avoided Outcome: Progressing   Problem: Activity: Goal: Risk for activity intolerance will decrease Outcome: Progressing   Problem: Nutrition: Goal: Adequate nutrition will be maintained Outcome: Progressing   Problem: Coping: Goal: Level of anxiety will decrease Outcome: Progressing   Problem: Coping: Goal: Level of anxiety will decrease Outcome: Progressing   Problem: Elimination: Goal: Will not experience complications related to bowel motility Outcome: Progressing Goal: Will not experience complications related to urinary retention Outcome: Progressing   Problem: Pain Managment: Goal: General experience of comfort will improve Outcome: Progressing   Problem: Safety: Goal: Ability to remain free from injury will improve Outcome: Progressing   Problem: Skin Integrity: Goal: Risk for impaired skin integrity will decrease Outcome: Progressing

## 2017-11-19 NOTE — Progress Notes (Signed)
Patient is medically stable for D/C back [facility]. [facility] patient can return today to room 313. RN will call report and arrange EMS for transport. Clinical Child psychotherapistocial Worker (CSW) sent D/C orders to [facility] via [employer]. CSW left patient's husband Jonny RuizJohn and son Bradly ChrisSteve voicemails. Please reconsult if future social work needs arise. CSW signing off.   [employer] Hughes Incorporated[employer]iley Ashok Sawaya, LCSW [phone]

## 2017-11-19 NOTE — Progress Notes (Addendum)
Pharmacy Antibiotic Note  Jonni SangerCecilia R [name] is a 77 y.o. female admitted on 11/15/2017 with UTI with ESBL E. Coli - has been on meropenem IP.  Pharmacy has been consulted for fosfomycin dosing.  Plan: Stop meropenem. Start fosfomycin 3 gm po once for ESBL E. Coli UTI/complicated.  Height: 5\' 2"  (157.5 cm) Weight: 136 lb 4.8 oz (61.8 kg) IBW/kg (Calculated) : 50.1  Temp (24hrs), Avg:99.1 F (37.3 C), Min:98 F (36.7 C), Max:100.5 F (38.1 C)  Recent Labs  Lab 11/15/17 1431 11/15/17 1501 11/15/17 1731 11/16/17 0637 11/17/17 0444 11/18/17 0545  WBC 15.4*  --   --  11.8*  --   --   CREATININE 2.06*  --   --  1.42* 0.88 0.69  LATICACIDVEN  --  1.9 1.3  --   --   --     Estimated Creatinine Clearance: 50.9 mL/min (by C-G formula based on SCr of 0.69 mg/dL).    Allergies  Allergen Reactions  . Codeine Other (See Comments)  . Aspirin     unknown  . Penicillins     Has patient had a PCN reaction causing immediate rash, facial/tongue/throat swelling, SOB or lightheadedness with hypotension: NO Has patient had a PCN reaction causing severe rash involving mucus membranes or skin necrosis: NO Has patient had a PCN reaction that required hospitalization NO Has patient had a PCN reaction occurring within the last 10 years: NO If all of the above answers are "NO", then may proceed with Cephalosporin use.    Antimicrobials this admission: Ceftriaxone (E Coli Resistant) 11/16/17 to 11/18/17 Meropenem 11/18/17 to 11/19/17  Dose adjustments this admission:   Microbiology results: 11/15/17 BCx: NGTD 11/15/17 UCx: ESBL E Coli    11/15/17 MRSA PCR: negative  Thank you for allowing pharmacy to be a part of this patient's care.  Carola FrostNathan A Alexandar Weisenberger, Pharm.D., BCPS Clinical Pharmacist 11/19/2017 7:38 AM

## 2017-11-19 NOTE — Progress Notes (Signed)
Pnt refused 2200 meds. Could not get pnt to even open her mouth. Pnt was alert but said she didn't want anymore medication.

## 2017-11-19 NOTE — Progress Notes (Signed)
Report called to Cordelia PenSherry, LPN and EMS contacted for transport.  Orson Apeanielle Lis Savitt, RN

## 2017-11-19 NOTE — Discharge Summary (Signed)
[facility] [place] [facility]   PATIENT NAME: [name] LowCecilia [name]    MR#:  [id number]  DATE OF BIRTH:  1941-05-13  DATE OF ADMISSION:  11/15/2017   ADMITTING PHYSICIAN: Altamese DillingVaibhavkumar Vachhani, MD  DATE OF DISCHARGE: 11/19/2017 PRIMARY CARE PHYSICIAN: Malva LimesFisher, Donald E, MD   ADMISSION DIAGNOSIS:  Hypernatremia [E87.0] AKI (acute kidney injury) (HCC) [N17.9] Sepsis, due to unspecified organism (HCC) [A41.9] Urinary tract infection without hematuria, site unspecified [N39.0] Altered mental status, unspecified altered mental status type [R41.82] DISCHARGE DIAGNOSIS:  Principal Problem:   Sepsis (HCC) Active Problems:   Acute renal failure (ARF) (HCC)   Malnutrition of moderate degree   Pressure injury of skin  SECONDARY DIAGNOSIS:   Past Medical History:  Diagnosis Date  . Anxiety   . Carotid stenosis    a. Carotid US (9/14):  Bilat 1-39% => f/u PRN  . Dementia   . Depression   . Gastroesophageal reflux disease   . History of echocardiogram    Echo 2/17: mild LVH, EF 55-60%, mild to mod AS (mean 14 mmHg), mechanical MVR ok (mean 7 mmHgg), mild LAE, PASP 58 mmHg  . HLD (hyperlipidemia)   . HTN (hypertension)   . Mitral regurgitation    a. s/p Bjork Shiley mechanical mitral valve replacement in 1980; b. 08/2016 Echo: EF 60-65%, no rwma, mild AS, mod dil LA, no signif MR, PASP 60mmHg.  . 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 non-obstructive by cath 2007.  [name]. PAF (paroxysmal atrial fibrillation) (HCC)    on chronic coumadin  . Paroxysmal atrial flutter (HCC)    s/p RFA x 2  . TIA (transient ischemic attack)   . Tobacco abuse    HOSPITAL COURSE:  CeciliaMurrellis a77 y.o.femalewith a known history of Anxiety, Dementia- lives in dementia unit in [facility], a fib, Htn, HLD- sent from NH due to altered mental status.   Acute metabolic encephalopathy due to sepsis and UTI. Aspiration fall precaution. Improved to baseline.  *  Sepsis due to complicated UTI (ESBL) Leukocytosis improved,Blood culture is negative so far and urine culture:ESBL. Disontinued Rocephin IV, started meropenem IV.  Fosfomycin 3 g one dose for ESBL today and no need after discharge per pharmacy.  * Acute renal failure due to dehydration.  Improved with half-normal saline IV.  Hold lisinopril and torsemide.  * hypernatremia Due to dehydration Improved with half-normal saline IV.  Hypokalemia.  Improved with potassium supplement.  * Dementia, Anxiety resumed Xanax prn.  * HTN Hold the meds due to Hypotension and sepsis. BP is in [name] side. Hold HTN meds and follow up PCP to resume.  Chronic A. fib.  Continue amiodarone and Coumadin. DISCHARGE CONDITIONS:  Stable, discharge to SNF today. CONSULTS OBTAINED:   DRUG ALLERGIES:   Allergies  Allergen Reactions  . Codeine Other (See Comments)  . Aspirin     unknown  . Penicillins     Has patient had a PCN reaction causing immediate rash, facial/tongue/throat swelling, SOB or lightheadedness with hypotension: NO Has patient had a PCN reaction causing severe rash involving mucus membranes or skin necrosis: NO Has patient had a PCN reaction that required hospitalization NO Has patient had a PCN reaction occurring within the last 10 years: NO If all of the above answers are "NO", then may proceed with Cephalosporin use.   DISCHARGE MEDICATIONS:   Allergies as of 11/19/2017      Reactions   Codeine Other (See Comments)   Aspirin  unknown   Penicillins    Has patient had a PCN reaction causing immediate rash, facial/tongue/throat swelling, SOB or lightheadedness with hypotension: NO Has patient had a PCN reaction causing severe rash involving mucus membranes or skin necrosis: NO Has patient had a PCN reaction that required hospitalization NO Has patient had a PCN reaction occurring within the last 10 years: NO If all of the above answers are "NO", then may proceed with  Cephalosporin use.      Medication List    STOP taking these medications   cephALEXin 500 MG capsule Commonly known as:  KEFLEX   lisinopril 10 MG tablet Commonly known as:  PRINIVIL,ZESTRIL   metoprolol tartrate 50 MG tablet Commonly known as:  LOPRESSOR   torsemide 20 MG tablet Commonly known as:  DEMADEX     TAKE these medications   ALPRAZolam 0.5 MG tablet Commonly known as:  XANAX Take 1 tablet (0.5 mg total) by mouth every 6 (six) hours as needed for anxiety.   amiodarone 200 MG tablet Commonly known as:  PACERONE Take 1 tablet (200 mg total) by mouth daily.   atorvastatin 40 MG tablet Commonly known as:  LIPITOR TAKE 1 TABLET BY MOUTH ONCE A DAY   buPROPion 75 MG tablet Commonly known as:  WELLBUTRIN TAKE 1 TABLET BY MOUTH EVERY MORNING   EQL ONE DAILY WOMENS PO Take 1 tablet by mouth daily.   escitalopram 20 MG tablet Commonly known as:  LEXAPRO TAKE 1 TABLET BY MOUTH ONCE DAILY   ferrous sulfate 325 (65 FE) MG tablet Take 325 mg by mouth daily with breakfast.   JANTOVEN 3 MG tablet Generic drug:  warfarin Take as directed. If you are unsure how to take this medication, talk to your nurse or doctor. Original instructions:  Take 3 mg by mouth every evening.   multivitamin with minerals Tabs tablet Take 1 tablet by mouth daily.   QUEtiapine 25 MG tablet Commonly known as:  SEROQUEL Take 25 mg by mouth at bedtime.   risperiDONE 1 MG tablet Commonly known as:  RISPERDAL Take 2 mg by mouth at bedtime.   traZODone 100 MG tablet Commonly known as:  DESYREL TAKE 1 TABLET BY MOUTH AT BEDTIME   Vitamin B 12 250 MCG Lozg Take 500 mg by mouth daily.   vitamin C 500 MG tablet Commonly known as:  ASCORBIC ACID Take 500 mg by mouth daily.        DISCHARGE INSTRUCTIONS:  See AVS.  If you experience worsening of your admission symptoms, develop shortness of breath, life threatening emergency, suicidal or homicidal thoughts you must seek medical  attention immediately by calling 911 or calling your MD immediately  if symptoms less severe.  You Must read complete instructions/literature along with all the possible adverse reactions/side effects for all the Medicines you take and that have been prescribed to you. Take any new Medicines after you have completely understood and accpet all the possible adverse reactions/side effects.   Please note  You were cared for by a hospitalist during your hospital stay. If you have any questions about your discharge medications or the care you received while you were in the hospital after you are discharged, you can call the unit and asked to speak with the hospitalist on call if the hospitalist that took care of you is not available. Once you are discharged, your primary care physician will handle any further medical issues. Please note that NO REFILLS for any discharge medications will be  authorized once you are discharged, as it is imperative that you return to your primary care physician (or establish a relationship with a primary care physician if you do not have one) for your aftercare needs so that they can reassess your need for medications and monitor your lab values.    On the day of Discharge:  VITAL SIGNS:  Blood pressure (!) 100/53, pulse 96, temperature 98 F (36.7 C), resp. rate (!) 22, height 5\' 2"  (1.575 m), weight 136 lb 4.8 oz (61.8 kg), SpO2 100 %. PHYSICAL EXAMINATION:  GENERAL:  77 y.o.-year-old patient lying in the bed with no acute distress.  EYES: No scleral icterus. Extraocular muscles intact.  HEENT: Head atraumatic, normocephalic.  NECK:  Supple, no jugular venous distention. No thyroid enlargement, no tenderness.  LUNGS: Normal breath sounds bilaterally, no wheezing, rales,rhonchi or crepitation. No use of accessory muscles of respiration.  CARDIOVASCULAR: S1, S2 normal. No murmurs, rubs, or gallops.  ABDOMEN: Soft, non-tender, non-distended. Bowel sounds present. No  organomegaly or mass.  EXTREMITIES: No pedal edema, cyanosis, or clubbing.  NEUROLOGIC: unable to exam.  PSYCHIATRIC: The patient is demented. SKIN: No obvious rash, lesion, or ulcer.  DATA REVIEW:   CBC Recent Labs  Lab 11/16/17 0637  WBC 11.8*  HGB 10.7*  HCT 32.9*  PLT 215    Chemistries  Recent Labs  Lab 11/15/17 1431  11/18/17 0545  NA 152*   < > 144  K 3.8   < > 4.2  CL 114*   < > 119*  CO2 27   < > 23  GLUCOSE 175*   < > 93  BUN 44*   < > 11  CREATININE 2.06*   < > 0.69  CALCIUM 9.3   < > 8.1*  MG  --    < > 1.8  AST 99*  --   --   ALT 79*  --   --   ALKPHOS 118  --   --   BILITOT 1.1  --   --    < > = values in this interval not displayed.     Microbiology Results  Results for orders placed or performed during the hospital encounter of 11/15/17  Urine culture     Status: Abnormal   Collection Time: 11/15/17  2:32 PM  Result Value Ref Range Status   Specimen Description   Final    URINE, RANDOM Performed at [facility], [address], [place], [place] [address]    Special Requests   Final    NONE Performed at [facility], [address] [place], [place] [address]    Culture (A)  Final    >=100,000 COLONIES/mL ESCHERICHIA COLI Confirmed Extended Spectrum Beta-Lactamase Producer (ESBL).  In bloodstream infections from ESBL organisms, carbapenems are preferred over piperacillin/tazobactam. They are shown to have a lower risk of mortality. Performed at [facility], [address] [place], [place] [address]    Report Status 11/18/2017 FINAL  Final   Organism ID, Bacteria ESCHERICHIA COLI (A)  Final      Susceptibility   Escherichia coli - MIC*    AMPICILLIN >=32 RESISTANT Resistant     CEFAZOLIN >=64 RESISTANT Resistant     CEFTRIAXONE >=64 RESISTANT Resistant     CIPROFLOXACIN >=4 RESISTANT Resistant     GENTAMICIN <=1 SENSITIVE Sensitive     IMIPENEM <=0.25 SENSITIVE Sensitive     NITROFURANTOIN <=16 SENSITIVE  Sensitive     TRIMETH/SULFA >=320 RESISTANT Resistant  AMPICILLIN/SULBACTAM 8 SENSITIVE Sensitive     PIP/TAZO <=4 SENSITIVE Sensitive     Extended ESBL POSITIVE Resistant     * >=100,000 COLONIES/mL ESCHERICHIA COLI  Culture, blood (Routine x 2)     Status: None (Preliminary result)   Collection Time: 11/15/17  2:50 PM  Result Value Ref Range Status   Specimen Description BLOOD LEFT FOREARM  Final   Special Requests   Final    BOTTLES DRAWN AEROBIC AND ANAEROBIC Blood Culture results may not be optimal due to an inadequate volume of blood received in culture bottles   Culture   Final    NO GROWTH 3 DAYS Performed at [facility], [address], [place], [place] [address]    Report Status PENDING  Incomplete  Culture, blood (Routine x 2)     Status: None (Preliminary result)   Collection Time: 11/15/17  5:31 PM  Result Value Ref Range Status   Specimen Description RIGHT ANTECUBITAL  Final   Special Requests   Final    BOTTLES DRAWN AEROBIC AND ANAEROBIC Blood Culture adequate volume   Culture   Final    NO GROWTH 3 DAYS Performed at [facility], [address], [place], [place] [address]    Report Status PENDING  Incomplete  MRSA PCR Screening     Status: None   Collection Time: 11/15/17  8:04 PM  Result Value Ref Range Status   MRSA by PCR NEGATIVE NEGATIVE Final    Comment:        The GeneXpert MRSA Assay (FDA approved for NASAL specimens only), is one component of a comprehensive MRSA colonization surveillance program. It is not intended to diagnose MRSA infection nor to guide or monitor treatment for MRSA infections. Performed at [facility], [address] [place], [place] [address]     RADIOLOGY:  No results found.   Management plans discussed with the patient, family and they are in agreement.  CODE STATUS: DNR   TOTAL TIME TAKING CARE OF THIS PATIENT: 33 minutes.    Shaune Pollack M.D on 11/19/2017 at 9:03 AM  Between  7am to 6pm - Pager - [phone]  After 6pm go to [email or web address] - Social research officer, government  [facility]  Office  [phone]  CC: Primary care physician; Malva Limes, MD   Note: This dictation was prepared with Dragon dictation along with smaller phrase technology. Any transcriptional errors that result from this process are unintentional.

## 2017-11-19 NOTE — Progress Notes (Signed)
ANTICOAGULATION CONSULT NOTE - Initial Consult  Pharmacy Consult for warfarin Indication: atrial fibrillation  Allergies  Allergen Reactions  . Codeine Other (See Comments)  . Aspirin     unknown  . Penicillins     Has patient had a PCN reaction causing immediate rash, facial/tongue/throat swelling, SOB or lightheadedness with hypotension: NO Has patient had a PCN reaction causing severe rash involving mucus membranes or skin necrosis: NO Has patient had a PCN reaction that required hospitalization NO Has patient had a PCN reaction occurring within the last 10 years: NO If all of the above answers are "NO", then may proceed with Cephalosporin use.    Patient Measurements: Height: 5\' 2"  (157.5 cm) Weight: 136 lb 4.8 oz (61.8 kg) IBW/kg (Calculated) : 50.1 Heparin Dosing Weight:   Vital Signs: BP: 100/53 (07/10 0659) Pulse Rate: 96 (07/10 0659)  Labs: Recent Labs    11/17/17 0444 11/17/17 1518 11/18/17 0545 11/19/17 0601  LABPROT  --  31.4* 28.1* 21.1*  INR  --  3.06 2.66 1.84  CREATININE 0.88  --  0.69  --     Estimated Creatinine Clearance: 50.9 mL/min (by C-G formula based on SCr of 0.69 mg/dL).   Medical History: Past Medical History:  Diagnosis Date  . Anxiety   . Carotid stenosis    a. Carotid US (9/14):  Bilat 1-39% => f/u PRN  . Dementia   . Depression   . Gastroesophageal reflux disease   . History of echocardiogram    Echo 2/17: mild LVH, EF 55-60%, mild to mod AS (mean 14 mmHg), mechanical MVR ok (mean 7 mmHgg), mild LAE, PASP 58 mmHg  . HLD (hyperlipidemia)   . HTN (hypertension)   . Mitral regurgitation    a. s/p Bjork Shiley mechanical mitral valve replacement in 1980; b. 08/2016 Echo: EF 60-65%, no rwma, mild AS, mod dil LA, no signif MR, PASP 60mmHg.  . 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 non-obstructive by cath 2007.  [name]. PAF (paroxysmal atrial fibrillation) (HCC)    on chronic coumadin  . Paroxysmal atrial flutter (HCC)    s/p  RFA x 2  . TIA (transient ischemic attack)   . Tobacco abuse     Medications:  Infusions:    Assessment: 89 yof with AMS being treated with rocephin for UTI. Pharmacy consulted to manage VKA while IP.   DATE INR DOSE 7/6 2.51 Held 7/7 N/A Held 7/8 3.06 Held 7/9 2.66 2 mg 7/10 1.84  Goal of Therapy:  INR 2-3 Monitor platelets by anticoagulation protocol: Yes   Plan:  INR is now subtherapeutic - dose has been held for several days and was just resumed yesterday. Will continue lower dose (warfarin 2 mg po daily) and check INR daily while on antibiotics.  Reno Clasby A. [place], [place]arm.D., BCPS Clinical Pharmacist 11/19/2017,9:07 AM

## 2017-11-19 NOTE — Progress Notes (Signed)
[employer] [facility]. [facility] is aware of above.   [place] [employer] Kienan Doublin, LCSW [phone]

## 2017-11-19 NOTE — Progress Notes (Signed)
Patient transport via EMS to [facility].  Patient's husband at bedside and aware.  Orson Apeanielle Amberlyn Martinezgarcia, RN

## 2017-11-20 LAB — CULTURE, BLOOD (ROUTINE X 2)
Culture: NO GROWTH
Culture: NO GROWTH
Special Requests: ADEQUATE

## 2017-11-20 LAB — BLOOD GAS, VENOUS
Acid-Base Excess: 3.3 mmol/L — ABNORMAL HIGH (ref 0.0–2.0)
Bicarbonate: 30.6 mmol/L — ABNORMAL HIGH (ref 20.0–28.0)
PCO2 VEN: 58 mmHg (ref 44.0–60.0)
PH VEN: 7.33 (ref 7.250–7.430)
Patient temperature: 37

## 2017-12-11 ENCOUNTER — Telehealth: Payer: Self-pay

## 2017-12-11 NOTE — Telephone Encounter (Signed)
Also, note left on husbands chart to schedule his AWV too. -MM

## 2017-12-11 NOTE — Telephone Encounter (Signed)
LM for pt to CB and schedule AWV for anytime after 01/03/18. -MM

## 2017-12-17 NOTE — Telephone Encounter (Signed)
Spoke with husband who states pt is in an assisted care facility now and will no longer be coming in for AWV. -MM

## 2017-12-29 ENCOUNTER — Emergency Department: Payer: Medicare (Managed Care)

## 2017-12-29 ENCOUNTER — Other Ambulatory Visit: Payer: Self-pay

## 2017-12-29 ENCOUNTER — Inpatient Hospital Stay
Admission: EM | Admit: 2017-12-29 | Discharge: 2018-01-02 | DRG: 640 | Disposition: A | Discharge status: Skilled Nursing Facility | Payer: Medicare (Managed Care) | Source: Skilled Nursing Facility | Attending: Internal Medicine | Admitting: Internal Medicine

## 2017-12-29 DIAGNOSIS — E876 Hypokalemia: Secondary | ICD-10-CM | POA: Diagnosis present

## 2017-12-29 DIAGNOSIS — Z87891 Personal history of nicotine dependence: Secondary | ICD-10-CM

## 2017-12-29 DIAGNOSIS — E871 Hypo-osmolality and hyponatremia: Secondary | ICD-10-CM | POA: Diagnosis present

## 2017-12-29 DIAGNOSIS — Z66 Do not resuscitate: Secondary | ICD-10-CM | POA: Diagnosis present

## 2017-12-29 DIAGNOSIS — G9341 Metabolic encephalopathy: Secondary | ICD-10-CM | POA: Diagnosis present

## 2017-12-29 DIAGNOSIS — Z6823 Body mass index (BMI) 23.0-23.9, adult: Secondary | ICD-10-CM

## 2017-12-29 DIAGNOSIS — F0150 Vascular dementia without behavioral disturbance: Secondary | ICD-10-CM | POA: Diagnosis present

## 2017-12-29 DIAGNOSIS — Z885 Allergy status to narcotic agent status: Secondary | ICD-10-CM

## 2017-12-29 DIAGNOSIS — N3000 Acute cystitis without hematuria: Secondary | ICD-10-CM | POA: Diagnosis present

## 2017-12-29 DIAGNOSIS — I480 Paroxysmal atrial fibrillation: Secondary | ICD-10-CM | POA: Diagnosis present

## 2017-12-29 DIAGNOSIS — Z952 Presence of prosthetic heart valve: Secondary | ICD-10-CM | POA: Diagnosis not present

## 2017-12-29 DIAGNOSIS — Z8673 Personal history of transient ischemic attack (TIA), and cerebral infarction without residual deficits: Secondary | ICD-10-CM

## 2017-12-29 DIAGNOSIS — R4182 Altered mental status, unspecified: Secondary | ICD-10-CM | POA: Diagnosis not present

## 2017-12-29 DIAGNOSIS — Z515 Encounter for palliative care: Secondary | ICD-10-CM | POA: Diagnosis not present

## 2017-12-29 DIAGNOSIS — K219 Gastro-esophageal reflux disease without esophagitis: Secondary | ICD-10-CM | POA: Diagnosis present

## 2017-12-29 DIAGNOSIS — F329 Major depressive disorder, single episode, unspecified: Secondary | ICD-10-CM | POA: Diagnosis present

## 2017-12-29 DIAGNOSIS — N390 Urinary tract infection, site not specified: Secondary | ICD-10-CM | POA: Diagnosis not present

## 2017-12-29 DIAGNOSIS — Z7901 Long term (current) use of anticoagulants: Secondary | ICD-10-CM

## 2017-12-29 DIAGNOSIS — E870 Hyperosmolality and hypernatremia: Principal | ICD-10-CM | POA: Diagnosis present

## 2017-12-29 DIAGNOSIS — Z7401 Bed confinement status: Secondary | ICD-10-CM | POA: Diagnosis not present

## 2017-12-29 DIAGNOSIS — F419 Anxiety disorder, unspecified: Secondary | ICD-10-CM | POA: Diagnosis present

## 2017-12-29 DIAGNOSIS — Z7189 Other specified counseling: Secondary | ICD-10-CM | POA: Diagnosis not present

## 2017-12-29 DIAGNOSIS — N179 Acute kidney failure, unspecified: Secondary | ICD-10-CM | POA: Diagnosis present

## 2017-12-29 DIAGNOSIS — R778 Other specified abnormalities of plasma proteins: Secondary | ICD-10-CM

## 2017-12-29 DIAGNOSIS — Z8249 Family history of ischemic heart disease and other diseases of the circulatory system: Secondary | ICD-10-CM

## 2017-12-29 DIAGNOSIS — I5020 Unspecified systolic (congestive) heart failure: Secondary | ICD-10-CM | POA: Diagnosis present

## 2017-12-29 DIAGNOSIS — Z79899 Other long term (current) drug therapy: Secondary | ICD-10-CM

## 2017-12-29 DIAGNOSIS — Z886 Allergy status to analgesic agent status: Secondary | ICD-10-CM

## 2017-12-29 DIAGNOSIS — R7989 Other specified abnormal findings of blood chemistry: Secondary | ICD-10-CM

## 2017-12-29 DIAGNOSIS — Z880 Allergy status to penicillin: Secondary | ICD-10-CM

## 2017-12-29 DIAGNOSIS — I2510 Atherosclerotic heart disease of native coronary artery without angina pectoris: Secondary | ICD-10-CM | POA: Diagnosis present

## 2017-12-29 DIAGNOSIS — E785 Hyperlipidemia, unspecified: Secondary | ICD-10-CM | POA: Diagnosis present

## 2017-12-29 DIAGNOSIS — E43 Unspecified severe protein-calorie malnutrition: Secondary | ICD-10-CM | POA: Diagnosis present

## 2017-12-29 DIAGNOSIS — Z96642 Presence of left artificial hip joint: Secondary | ICD-10-CM | POA: Diagnosis present

## 2017-12-29 DIAGNOSIS — I110 Hypertensive heart disease with heart failure: Secondary | ICD-10-CM | POA: Diagnosis present

## 2017-12-29 DIAGNOSIS — E860 Dehydration: Secondary | ICD-10-CM | POA: Diagnosis present

## 2017-12-29 LAB — URINALYSIS, COMPLETE (UACMP) WITH MICROSCOPIC
BILIRUBIN URINE: NEGATIVE
Bacteria, UA: NONE SEEN
Glucose, UA: NEGATIVE mg/dL
Ketones, ur: 5 mg/dL — AB
Nitrite: NEGATIVE
Protein, ur: NEGATIVE mg/dL
SPECIFIC GRAVITY, URINE: 1.013 (ref 1.005–1.030)
WBC, UA: >50 WBC/hpf — ABNORMAL HIGH (ref 0–5)
pH: 5 (ref 5.0–8.0)

## 2017-12-29 LAB — CBC WITH DIFFERENTIAL/PLATELET
Basophils Absolute: 0.1 10*3/uL (ref 0–0.1)
Basophils Relative: 1 %
EOS PCT: 6 %
Eosinophils Absolute: 0.5 10*3/uL (ref 0–0.7)
HCT: 33.3 % — ABNORMAL LOW (ref 35.0–47.0)
Hemoglobin: 10.5 g/dL — ABNORMAL LOW (ref 12.0–16.0)
LYMPHS ABS: 1.7 10*3/uL (ref 1.0–3.6)
LYMPHS PCT: 21 %
MCH: 28.2 pg (ref 26.0–34.0)
MCHC: 31.6 g/dL — ABNORMAL LOW (ref 32.0–36.0)
MCV: 89.1 fL (ref 80.0–100.0)
MONO ABS: 0.5 10*3/uL (ref 0.2–0.9)
Monocytes Relative: 7 %
Neutro Abs: 5 10*3/uL (ref 1.4–6.5)
Neutrophils Relative %: 65 %
PLATELETS: 192 10*3/uL (ref 150–440)
RBC: 3.74 MIL/uL — ABNORMAL LOW (ref 3.80–5.20)
RDW: 17.4 % — AB (ref 11.5–14.5)
WBC: 7.8 10*3/uL (ref 3.6–11.0)

## 2017-12-29 LAB — COMPREHENSIVE METABOLIC PANEL
ALT: 27 U/L (ref 0–44)
AST: 35 U/L (ref 15–41)
Albumin: 2.2 g/dL — ABNORMAL LOW (ref 3.5–5.0)
Alkaline Phosphatase: 92 U/L (ref 38–126)
BILIRUBIN TOTAL: 0.8 mg/dL (ref 0.3–1.2)
BUN: 43 mg/dL — AB (ref 8–23)
CO2: 25 mmol/L (ref 22–32)
CREATININE: 1.88 mg/dL — AB (ref 0.44–1.00)
Calcium: 8.5 mg/dL — ABNORMAL LOW (ref 8.9–10.3)
Chloride: >130 mmol/L (ref 98–111)
GFR, EST AFRICAN AMERICAN: 29 mL/min — AB (ref >60)
GFR, EST NON AFRICAN AMERICAN: 25 mL/min — AB (ref >60)
Glucose, Bld: 78 mg/dL (ref 70–99)
POTASSIUM: 3.6 mmol/L (ref 3.5–5.1)
Sodium: 169 mmol/L (ref 135–145)
TOTAL PROTEIN: 6 g/dL — AB (ref 6.5–8.1)

## 2017-12-29 LAB — PROTIME-INR
INR: 3.26
PROTHROMBIN TIME: 33 s — AB (ref 11.4–15.2)

## 2017-12-29 LAB — LACTIC ACID, PLASMA
Lactic Acid, Venous: 0.8 mmol/L (ref 0.5–1.9)
Lactic Acid, Venous: 0.9 mmol/L (ref 0.5–1.9)

## 2017-12-29 LAB — MRSA PCR SCREENING: MRSA by PCR: NEGATIVE

## 2017-12-29 LAB — BRAIN NATRIURETIC PEPTIDE: B NATRIURETIC PEPTIDE 5: 190 pg/mL — AB (ref 0.0–100.0)

## 2017-12-29 LAB — TROPONIN I: TROPONIN I: 0.18 ng/mL — AB (ref <0.03)

## 2017-12-29 MED ORDER — DEXTROSE 5 % IV SOLN
Freq: Once | INTRAVENOUS | Status: AC
  Administered 2017-12-29: 13:00:00 via INTRAVENOUS

## 2017-12-29 MED ORDER — SILVER SULFADIAZINE 1 % EX CREA
1.0000 "application " | TOPICAL_CREAM | Freq: Two times a day (BID) | CUTANEOUS | Status: DC
  Administered 2017-12-29 – 2018-01-01 (×5): 1 via TOPICAL
  Filled 2017-12-29: qty 85

## 2017-12-29 MED ORDER — ACETAMINOPHEN 325 MG PO TABS: 650.0000 mg | ORAL_TABLET | Freq: Four times a day (QID) | ORAL | Status: DC | PRN

## 2017-12-29 MED ORDER — ADULT MULTIVITAMIN W/MINERALS CH
1.0000 | ORAL_TABLET | Freq: Every day | ORAL | Status: DC
  Administered 2017-12-30 – 2018-01-02 (×3): 1 via ORAL
  Filled 2017-12-29 (×3): qty 1

## 2017-12-29 MED ORDER — WARFARIN - PHARMACIST DOSING INPATIENT
Freq: Every day | Status: DC
  Administered 2017-12-29 – 2017-12-30 (×2)

## 2017-12-29 MED ORDER — SENNA 8.6 MG PO TABS
2.0000 | ORAL_TABLET | Freq: Two times a day (BID) | ORAL | Status: DC
  Administered 2017-12-29 – 2018-01-02 (×5): 17.2 mg via ORAL
  Filled 2017-12-29 (×6): qty 2

## 2017-12-29 MED ORDER — SODIUM CHLORIDE 0.9 % IV SOLN
1.0000 g | INTRAVENOUS | Status: DC
  Administered 2017-12-30: 1 g via INTRAVENOUS
  Filled 2017-12-29: qty 1
  Filled 2017-12-29: qty 10

## 2017-12-29 MED ORDER — AMIODARONE HCL 200 MG PO TABS
200.0000 mg | ORAL_TABLET | Freq: Every day | ORAL | Status: DC
  Administered 2017-12-30 – 2018-01-02 (×4): 200 mg via ORAL
  Filled 2017-12-29 (×4): qty 1

## 2017-12-29 MED ORDER — ESCITALOPRAM OXALATE 10 MG PO TABS
20.0000 mg | ORAL_TABLET | Freq: Every day | ORAL | Status: DC
  Administered 2017-12-30 – 2018-01-02 (×3): 20 mg via ORAL
  Filled 2017-12-29 (×4): qty 2

## 2017-12-29 MED ORDER — ONDANSETRON HCL 4 MG/2ML IJ SOLN: 4.0000 mg | Freq: Four times a day (QID) | INTRAMUSCULAR | Status: DC | PRN

## 2017-12-29 MED ORDER — RISPERIDONE 1 MG PO TABS
2.0000 mg | ORAL_TABLET | Freq: Every day | ORAL | Status: DC
  Administered 2017-12-29 – 2017-12-30 (×2): 2 mg via ORAL
  Filled 2017-12-29 (×5): qty 2

## 2017-12-29 MED ORDER — ACETAMINOPHEN 650 MG RE SUPP: 650.0000 mg | Freq: Four times a day (QID) | RECTAL | Status: DC | PRN

## 2017-12-29 MED ORDER — FERROUS SULFATE 325 (65 FE) MG PO TABS
325.0000 mg | ORAL_TABLET | Freq: Every day | ORAL | Status: DC
  Administered 2017-12-30 – 2018-01-02 (×3): 325 mg via ORAL
  Filled 2017-12-29 (×3): qty 1

## 2017-12-29 MED ORDER — VITAMIN B-12 1000 MCG PO TABS
500.0000 ug | ORAL_TABLET | Freq: Every day | ORAL | Status: DC
  Administered 2017-12-30 – 2018-01-02 (×4): 500 ug via ORAL
  Filled 2017-12-29 (×5): qty 1

## 2017-12-29 MED ORDER — DEXTROSE 5 % IV SOLN
INTRAVENOUS | Status: DC
  Administered 2017-12-29 – 2018-01-01 (×7): via INTRAVENOUS

## 2017-12-29 MED ORDER — METOPROLOL TARTRATE 25 MG PO TABS
12.5000 mg | ORAL_TABLET | Freq: Two times a day (BID) | ORAL | Status: DC
  Administered 2018-01-01: 12.5 mg via ORAL
  Filled 2017-12-29 (×5): qty 1

## 2017-12-29 MED ORDER — SODIUM CHLORIDE 0.9 % IV SOLN
Freq: Once | INTRAVENOUS | Status: AC
  Administered 2017-12-29: 13:00:00 via INTRAVENOUS

## 2017-12-29 MED ORDER — WARFARIN SODIUM 3 MG PO TABS
3.0000 mg | ORAL_TABLET | Freq: Once | ORAL | Status: AC
  Administered 2017-12-29: 3 mg via ORAL
  Filled 2017-12-29: qty 1

## 2017-12-29 MED ORDER — DIVALPROEX SODIUM 125 MG PO DR TAB
125.0000 mg | DELAYED_RELEASE_TABLET | Freq: Two times a day (BID) | ORAL | Status: DC
  Administered 2017-12-30 – 2017-12-31 (×3): 125 mg via ORAL
  Filled 2017-12-29 (×4): qty 1

## 2017-12-29 MED ORDER — VITAMIN C 500 MG PO TABS
500.0000 mg | ORAL_TABLET | Freq: Every day | ORAL | Status: DC
  Administered 2017-12-30 – 2018-01-02 (×3): 500 mg via ORAL
  Filled 2017-12-29 (×4): qty 1

## 2017-12-29 MED ORDER — TRAZODONE HCL 100 MG PO TABS
100.0000 mg | ORAL_TABLET | Freq: Every day | ORAL | Status: DC
  Administered 2017-12-29 – 2017-12-30 (×2): 100 mg via ORAL
  Filled 2017-12-29 (×3): qty 1

## 2017-12-29 MED ORDER — SODIUM CHLORIDE 0.9 % IV SOLN
1.0000 g | Freq: Once | INTRAVENOUS | Status: AC
  Administered 2017-12-29: 1 g via INTRAVENOUS
  Filled 2017-12-29: qty 10

## 2017-12-29 MED ORDER — ONDANSETRON HCL 4 MG PO TABS: 4.0000 mg | ORAL_TABLET | Freq: Four times a day (QID) | ORAL | Status: DC | PRN

## 2017-12-29 MED ORDER — ATORVASTATIN CALCIUM 20 MG PO TABS
40.0000 mg | ORAL_TABLET | Freq: Every day | ORAL | Status: DC
  Administered 2017-12-30 – 2018-01-02 (×4): 40 mg via ORAL
  Filled 2017-12-29 (×4): qty 2

## 2017-12-29 NOTE — ED Notes (Signed)
Pt checked and dry at this time. 

## 2017-12-29 NOTE — ED Notes (Signed)
Pt cleaned up and repositioned in bed at this time 

## 2017-12-29 NOTE — ED Notes (Signed)
Pt not following commands at this time. Pt unable to respond. MD at bedside. Pt's husband trying to arose pt. Pt tried to communicate to husband. Pt's husband states she has not been communicating since she got septic and was taken to [facility].

## 2017-12-29 NOTE — ED Notes (Signed)
MD notified of critical test results.  Na-169 Cl>130 Troponin 0.18

## 2017-12-29 NOTE — ED Notes (Signed)
Pt checked at this time. Pt dry

## 2017-12-29 NOTE — ED Provider Notes (Addendum)
[facility] Emergency Department Provider Note   ____________________________________________   First MD Initiated Contact with Patient 12/29/17 1140     (approximate)  I have reviewed the triage vital signs and the nursing notes.   HISTORY  Chief Complaint Altered Mental Status    HPI [name] [name] is a 77 y.o. female patient was in the hospital with hyponatremia and sepsis beginning of last month.  She went back to the nursing home was continuing her IVs.  EMS reports they lost the IV at the nursing home could not get it restarted so she came here to complete her antibiotics.  EMS was able to get an IV easily.  Patient however is reported by her husband to not be herself usually she is awake and talking and she is just sitting there now not responding.  She is also not been eating the last few days of food just sits in her mouth.  She had a temperature here rectally of 99.4.   Past Medical History:  Diagnosis Date  . Anxiety   . Carotid stenosis    a. Carotid US (9/14):  Bilat 1-39% => f/u PRN  . Dementia   . Depression   . Gastroesophageal reflux disease   . History of echocardiogram    Echo 2/17: mild LVH, EF 55-60%, mild to mod AS (mean 14 mmHg), mechanical MVR ok (mean 7 mmHgg), mild LAE, PASP 58 mmHg  . HLD (hyperlipidemia)   . HTN (hypertension)   . Mitral regurgitation    a. s/p Bjork Shiley mechanical mitral valve replacement in 1980; b. 08/2016 Echo: EF 60-65%, no rwma, mild AS, mod dil LA, no signif MR, PASP .  . 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 non-obstructive by cath 2007.  [name] PAF (paroxysmal atrial fibrillation) (HCC)    on chronic coumadin  . Paroxysmal atrial flutter (HCC)    s/p RFA x 2  . TIA (transient ischemic attack)   . Tobacco abuse     Patient Active Problem List   Diagnosis Date Noted  . Malnutrition of moderate degree 11/18/2017  . Pressure injury of skin 11/18/2017  . Sepsis (HCC)  11/15/2017  . Acute renal failure (ARF) (HCC) 11/15/2017  . Encephalopathy acute 07/29/2017  . Valvular heart disease 06/13/2017  . 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 Coronary artery disease involving native coronary artery of native heart without angina pectoris 06/16/2015  . Aspiration pneumonia (HCC) 06/09/2015  . Atrial flutter (HCC) 06/07/2015  . Hypokalemia 06/07/2015  . Non-occlusive coronary artery disease 06/07/2015  . Acute upper gastrointestinal bleeding   . Erosive gastritis with hemorrhage   . 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 H/O prosthetic heart valve 03/27/2015  . Insomnia 03/27/2015  . 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 BP (high blood pressure) 03/27/2015  . H/O transient cerebral ischemia 03/27/2015  . Arthritis, degenerative 03/27/2015  . Closed fracture of hip (HCC) 03/27/2015  . Anxiety 03/27/2015  . Hyperlipemia 03/27/2015  . Depression 10/17/2014  . Dementia 05/06/2014  . Fracture of femoral neck, left (HCC) 05/06/2014  . Displaced fracture of left femoral neck (HCC) 05/05/2014  . Encounter for therapeutic drug monitoring 06/09/2013  . Carotid stenosis 04/08/2011  . Transient ischemic attack (TIA) 08/07/2010  . TOBACCO ABUSE 09/21/2008  . Essential hypertension, benign 09/21/2008  . PAF (paroxysmal atrial fibrillation) (HCC) 09/21/2008    Past Surgical History:  Procedure Laterality Date  . A FLUTTER ABLATION     x 2  . ESOPHAGOGASTRODUODENOSCOPY  N/A 06/04/2015   Procedure: ESOPHAGOGASTRODUODENOSCOPY (EGD);  Surgeon: Iva Booparl E Gessner, MD;  [facility];  Service: Endoscopy;  Laterality: N/A;  . HIP ARTHROPLASTY Left 05/07/2014   Procedure: ARTHROPLASTY MONOPOLAR HIP;  Surgeon: Eldred MangesMark C Yates, MD;  Location: [facility] OR;  Service: Orthopedics;  Laterality: Left;  . MITRAL VALVE REPLACEMENT     Bjork-Shiley valve placed for mitral regurgitation    Prior to Admission medications   Medication Sig Start Date End Date Taking?  Authorizing Provider  ALPRAZolam Prudy Feeler(XANAX) 0.5 MG tablet Take 1 tablet (0.5 mg total) by mouth every 6 (six) hours as needed for anxiety. 08/03/17   Ramonita LabGouru, Aruna, MD  amiodarone (PACERONE) 200 MG tablet Take 1 tablet (200 mg total) by mouth daily. 03/07/17   Creig HinesBerge, Christopher Ronald, NP  Ascorbic Acid (VITAMIN C) 500 MG tablet Take 500 mg by mouth daily.      [provider]  atorvastatin (LIPITOR) 40 MG tablet TAKE 1 TABLET BY MOUTH ONCE A DAY 07/29/16   Malva LimesFisher, Donald E, MD  buPROPion [facility](WELLBUTRIN) 75 MG tablet TAKE 1 TABLET BY MOUTH EVERY MORNING Patient not taking: Reported on 11/15/2017 04/15/16   Malva LimesFisher, Donald E, MD  Cyanocobalamin (VITAMIN B 12) 250 MCG LOZG Take 500 mg by mouth daily.      [provider]  escitalopram (LEXAPRO) 20 MG tablet TAKE 1 TABLET BY MOUTH ONCE DAILY 02/28/17   Malva LimesFisher, Donald E, MD  ferrous sulfate 325 (65 FE) MG tablet Take 325 mg by mouth daily with breakfast.    [provider]  JANTOVEN 3 MG tablet Take 3 mg by mouth every evening. 11/07/17   [provider]  Multiple Vitamin (MULTIVITAMIN WITH MINERALS) TABS tablet Take 1 tablet by mouth daily.    [provider]  Multiple Vitamins-Calcium (EQL ONE DAILY WOMENS PO) Take 1 tablet by mouth daily.      [provider]  QUEtiapine (SEROQUEL) 25 MG tablet Take 25 mg by mouth at bedtime. 11/14/17   [provider]  risperiDONE (RISPERDAL) 1 MG tablet Take 2 mg by mouth at bedtime. 09/25/17   [provider]  traZODone (DESYREL) 100 MG tablet TAKE 1 TABLET BY MOUTH AT BEDTIME 07/03/17   Malva LimesFisher, Donald E, MD    Allergies Codeine; Aspirin; and Penicillins  Family History  Problem Relation Age of Onset  . Heart attack Other   . Hypertension Neg Hx   . Stroke Neg Hx     Social History Social History   Tobacco Use  . Smoking status: Former Smoker    Packs/day: 0.25    Years: 35.00    Pack years: 8.75    Types: Cigarettes    Last attempt to  quit: 08/25/2015    Years since quitting: 2.3  . Smokeless tobacco: Former Engineer, waterUser  Substance Use Topics  . Alcohol use: No  . Drug use: No    Review of Systems unobtainable  ____________________________________________   PHYSICAL EXAM:  VITAL SIGNS: ED Triage Vitals [12/29/17 1131]  Enc Vitals Group     BP (!) 103/44     Pulse Rate 70     Resp 19     Temp 99.4 F (37.4 C)     Temp Source Rectal     SpO2 97 %     Weight 130 lb (59 kg)     Height 5\' 3"  (1.6 m)     Head Circumference      Peak Flow      Pain  Score      Pain Loc      Pain Edu?      Excl. in GC?     Constitutional: Sleepy and difficult to arouse not in any distress Eyes: Conjunctivae are normal.  Head: Atraumatic.  Patient has some oozing from the mouth which is Hemoccult positive not sure if this is vomit or something she is coughed up Nose: No congestion/rhinnorhea. Mouth/Throat: Mucous membranes are moist.  Oropharynx non-erythematous. Neck: No stridor. Cardiovascular: Normal rate, regular rhythm. Grossly normal heart sounds.  Good peripheral circulation. Respiratory: Normal respiratory effort.  No retractions. Lungs CTAB. Gastrointestinal: Soft and nontender. No distention. No abdominal bruits. No CVA tenderness. Rectal: Hemoccult negative no masses Musculoskeletal: No lower extremity tenderness nor edema. Neurologic: Patient unresponsive except to pain she is contracted not moving her extremities Skin:  Skin is warm, dry appears to be very small stage II decubitus on the lower inner thigh left leg with the legs rubbing together Psychiatric: Mood and affect are normal. Speech and behavior are normal.  ____________________________________________   LABS (all labs ordered are listed, but only abnormal results are displayed)  Labs Reviewed  COMPREHENSIVE METABOLIC PANEL - Abnormal; Notable for the following components:      Result Value   Sodium 169 (*)    Chloride >130 (*)    BUN 43 (*)     Creatinine, Ser 1.88 (*)    Calcium 8.5 (*)    Total Protein 6.0 (*)    Albumin 2.2 (*)    GFR calc non Af Amer 25 (*)    GFR calc Af Amer 29 (*)    All other components within normal limits  BRAIN NATRIURETIC PEPTIDE - Abnormal; Notable for the following components:   B Natriuretic Peptide 190.0 (*)    All other components within normal limits  TROPONIN I - Abnormal; Notable for the following components:   Troponin I 0.18 (*)    All other components within normal limits  CBC WITH DIFFERENTIAL/PLATELET - Abnormal; Notable for the following components:   RBC 3.74 (*)    Hemoglobin 10.5 (*)    HCT 33.3 (*)    MCHC 31.6 (*)    RDW 17.4 (*)    All other components within normal limits  URINALYSIS, COMPLETE (UACMP) WITH MICROSCOPIC - Abnormal; Notable for the following components:   Color, Urine YELLOW (*)    APPearance CLOUDY (*)    Hgb urine dipstick SMALL (*)    Ketones, ur 5 (*)    Leukocytes, UA SMALL (*)    WBC, UA >50 (*)    All other components within normal limits  LACTIC ACID, PLASMA  LACTIC ACID, PLASMA   ____________________________________________  EKG  EKG read interpreted by me shows sinus rhythm rate of 73 normal axis no acute ST-T wave changes very irregular baseline computer in fact is reading AV disassociation but on the monitor strip done at the same time as EKG you can see P waves which are regularly associated with the QRS ____________________________________________  RADIOLOGY  ED MD interpretation:    Official radiology report(s): Ct Head Wo Contrast  Result Date: 12/29/2017 CLINICAL DATA:  NG a, altered level of consciousness EXAM: CT HEAD WITHOUT CONTRAST TECHNIQUE: Contiguous axial images were obtained from the base of the skull through the vertex without intravenous contrast. COMPARISON:  11/15/2017 FINDINGS: Brain: There is atrophy and chronic small vessel disease changes. Old left frontal infarct, stable. No acute intracranial abnormality.  Specifically, no hemorrhage, hydrocephalus, mass lesion,  acute infarction, or significant intracranial injury. Vascular: No hyperdense vessel or unexpected calcification. Skull: No acute calvarial abnormality. Sinuses/Orbits: Visualized paranasal sinuses and mastoids clear. Orbital soft tissues unremarkable. Other: None IMPRESSION: No acute intracranial abnormality. Atrophy, chronic microvascular disease. Old left frontal infarct. Electronically Signed   By: Charlett Nose M.D.   On: 12/29/2017 12:12   Dg Chest Portable 1 View  Result Date: 12/29/2017 CLINICAL DATA:  Acute altered mental status. EXAM: PORTABLE CHEST 1 VIEW COMPARISON:  11/15/2017, 08/02/2017 and 07/29/2017 FINDINGS: There is new mild disc central a shin of the interstitial markings primarily on the right consistent with mild interstitial edema. Overall heart size is normal. Prosthetic aortic valve. Aortic atherosclerosis. Severe compression fracture of T7, increased since 08/02/2017 but probably not significantly changed since 11/15/2017. IMPRESSION: 1. Mild interstitial pulmonary edema. 2.  Aortic Atherosclerosis (ICD10-I70.0). Electronically Signed   By: Francene Boyers M.D.   On: 12/29/2017 12:11    ____________________________________________   PROCEDURES  Procedure(s) performed:   Procedures  Critical Care performed: Nickel care time 45 minutes.  This includes discussing the patient with her husband twice talking to the hospitalist reviewing the old records and reviewing the treatment for hyponatremia on up-to-date.  ____________________________________________   INITIAL IMPRESSION / ASSESSMENT AND PLAN / ED COURSE Lab calls and reports a sodium of 169.  Chlorides elevated as well as the troponin.  0.18.  Up-to-date recommends replacing the water loss with 3-6 5 mL/kg of D5W and maintenance fluids on top of that.  This is what we will do.  Initially patient has a UTI.  She has acute kidney kidney injury most likely from her  dehydration.  She has an elevated BNP and a chest x-ray consistent with CHF as well.  Also her troponin is elevated.  I will watch the fluid status carefully.  We will plan on getting her in the hospital.       ____________________________________________   FINAL CLINICAL IMPRESSION(S) / ED DIAGNOSES  Final diagnoses:  AKI (acute kidney injury) (HCC)  Hypernatremia  Urinary tract infection without hematuria, site unspecified  Vascular dementia without behavioral disturbance  Elevated troponin  Systolic congestive heart failure, unspecified HF chronicity (HCC)  Altered mental status, unspecified altered mental status type     ED Discharge Orders    None       Note:  This document was prepared using Dragon voice recognition software and may include unintentional dictation errors.    Arnaldo Natal, MD 12/29/17 1329    Arnaldo Natal, MD 12/29/17 1330

## 2017-12-29 NOTE — H&P (Signed)
[facility] [place] [facility]   PATIENT NAME: [name] LowCecilia Legendre    MR#:  [id number]  DATE OF BIRTH:  Nov 13, 1940  DATE OF ADMISSION:  12/29/2017  PRIMARY CARE PHYSICIAN: Malva LimesFisher, Donald E, MD   REQUESTING/REFERRING PHYSICIAN: Dr Dorothea GlassmanPaul Malinda  CHIEF COMPLAINT:   Chief Complaint  Patient presents with  . Altered Mental Status    HISTORY OF PRESENT ILLNESS:  [name] [name]  is a 77 y.o. female with a known history of dementia brought in with altered mental status.  As per the husband she has not been eating well for the past week and a half.  Unable to get her even to drink anything over the last couple days.  With her dementia she is bedbound and able to recognize her husband and son.  Patient does answer few questions.  In the ER she was found to have a sodium of 169 and a creatinine of 1.88 and a urinalysis that was positive.  Hospitalist services were contacted for further evaluation.  PAST MEDICAL HISTORY:   Past Medical History:  Diagnosis Date  . Anxiety   . Carotid stenosis    a. Carotid US (9/14):  Bilat 1-39% => f/u PRN  . Dementia   . Depression   . Gastroesophageal reflux disease   . History of echocardiogram    Echo 2/17: mild LVH, EF 55-60%, mild to mod AS (mean 14 mmHg), mechanical MVR ok (mean 7 mmHgg), mild LAE, PASP 58 mmHg  . HLD (hyperlipidemia)   . HTN (hypertension)   . Mitral regurgitation    a. s/p Bjork Shiley mechanical mitral valve replacement in 1980; b. 08/2016 Echo: EF 60-65%, no rwma, mild AS, mod dil LA, no signif MR, PASP 60mmHg.  . 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 non-obstructive by cath 2007.  [name]. PAF (paroxysmal atrial fibrillation) (HCC)    on chronic coumadin  . Paroxysmal atrial flutter (HCC)    s/p RFA x 2  . TIA (transient ischemic attack)   . Tobacco abuse     PAST SURGICAL HISTORY:   Past Surgical History:  Procedure Laterality Date  . A FLUTTER ABLATION     x 2  .  ESOPHAGOGASTRODUODENOSCOPY N/A 06/04/2015   Procedure: ESOPHAGOGASTRODUODENOSCOPY (EGD);  Surgeon: Iva Booparl E Gessner, MD;  [facility];  Service: Endoscopy;  Laterality: N/A;  . HIP ARTHROPLASTY Left 05/07/2014   Procedure: ARTHROPLASTY MONOPOLAR HIP;  Surgeon: Eldred MangesMark C Yates, MD;  Location: [facility] OR;  Service: Orthopedics;  Laterality: Left;  . MITRAL VALVE REPLACEMENT     Bjork-Shiley valve placed for mitral regurgitation    SOCIAL HISTORY:   Social History   Tobacco Use  . Smoking status: Former Smoker    Packs/day: 0.25    Years: 35.00    Pack years: 8.75    Types: Cigarettes    Last attempt to quit: 08/25/2015    Years since quitting: 2.3  . Smokeless tobacco: Former Engineer, waterUser  Substance Use Topics  . Alcohol use: No    FAMILY HISTORY:   Family History  Problem Relation Age of Onset  . Dementia Mother   . CAD Father   . Heart attack Other   . Hypertension Neg Hx   . Stroke Neg Hx     DRUG ALLERGIES:   Allergies  Allergen Reactions  . Codeine Other (See Comments)  . Aspirin     unknown  . Penicillins     Has patient had a PCN reaction causing immediate  rash, facial/tongue/throat swelling, SOB or lightheadedness with hypotension: NO Has patient had a PCN reaction causing severe rash involving mucus membranes or skin necrosis: NO Has patient had a PCN reaction that required hospitalization NO Has patient had a PCN reaction occurring within the last 10 years: NO If all of the above answers are "NO", then may proceed with Cephalosporin use.    REVIEW OF SYSTEMS:  Difficult to review of systems with dementia and altered mental status.  Cardiovascular system: No chest pain Gastrointestinal.  No abdominal pain   MEDICATIONS AT HOME:   Prior to Admission medications   Medication Sig Start Date End Date Taking? Authorizing Provider  ALPRAZolam Prudy Feeler) 0.5 MG tablet Take 1 tablet (0.5 mg total) by mouth every 6 (six) hours as needed for anxiety. 08/03/17   Ramonita Lab, MD  amiodarone (PACERONE) 200 MG tablet Take 1 tablet (200 mg total) by mouth daily. 03/07/17   Creig Hines, NP  Ascorbic Acid (VITAMIN C) 500 MG tablet Take 500 mg by mouth daily.      [provider]  atorvastatin (LIPITOR) 40 MG tablet TAKE 1 TABLET BY MOUTH ONCE A DAY 07/29/16   Malva Limes, MD  buPROPion [facility]) 75 MG tablet TAKE 1 TABLET BY MOUTH EVERY MORNING Patient not taking: Reported on 11/15/2017 04/15/16   Malva Limes, MD  Cyanocobalamin (VITAMIN B 12) 250 MCG LOZG Take 500 mg by mouth daily.      [provider]  escitalopram (LEXAPRO) 20 MG tablet TAKE 1 TABLET BY MOUTH ONCE DAILY 02/28/17   Malva Limes, MD  ferrous sulfate 325 (65 FE) MG tablet Take 325 mg by mouth daily with breakfast.    [provider]  JANTOVEN 3 MG tablet Take 3 mg by mouth every evening. 11/07/17   [provider]  Multiple Vitamin (MULTIVITAMIN WITH MINERALS) TABS tablet Take 1 tablet by mouth daily.    [provider]  Multiple Vitamins-Calcium (EQL ONE DAILY WOMENS PO) Take 1 tablet by mouth daily.      [provider]  QUEtiapine (SEROQUEL) 25 MG tablet Take 25 mg by mouth at bedtime. 11/14/17   [provider]  risperiDONE (RISPERDAL) 1 MG tablet Take 2 mg by mouth at bedtime. 09/25/17   [provider]  traZODone (DESYREL) 100 MG tablet TAKE 1 TABLET BY MOUTH AT BEDTIME 07/03/17   Malva Limes, MD   Medication reconciliation still undergoing.  VITAL SIGNS:  Blood pressure (!) 108/52, pulse 69, temperature 99.4 F (37.4 C), temperature source Rectal, resp. rate 13, height 5\' 3"  (1.6 m), weight 59 kg, SpO2 98 %.  PHYSICAL EXAMINATION:  GENERAL:  77 y.o.-year-old patient lying in the bed with no acute distress.  EYES: Pupils equal, round, reactive to light and accommodation. No scleral icterus.   HEENT: Head atraumatic, normocephalic. Oropharynx small little bumps in the back of the mouth.  And  nasopharynx clear.  NECK:  Supple, no jugular venous distention. No thyroid enlargement, no tenderness.  LUNGS: Normal breath sounds bilaterally, no wheezing, rales,rhonchi or crepitation. No use of accessory muscles of respiration.  CARDIOVASCULAR: S1, S2 normal. No murmurs, rubs, or gallops.  ABDOMEN: Soft, nontender, nondistended. Bowel sounds present. No organomegaly or mass.  EXTREMITIES: No pedal edema, cyanosis, or clubbing.  NEUROLOGIC: Patient is lethargic but answers a few questions.  PSYCHIATRIC: The patient is lethargic.  SKIN: No rash, lesion, or ulcer.   LABORATORY PANEL:   CBC Recent Labs  Lab 12/29/17  1134  WBC 7.8  HGB 10.5*  HCT 33.3*  PLT 192   ------------------------------------------------------------------------------------------------------------------  Chemistries  Recent Labs  Lab 12/29/17 1134  NA 169*  K 3.6  CL >130*  CO2 25  GLUCOSE 78  BUN 43*  CREATININE 1.88*  CALCIUM 8.5*  AST 35  ALT 27  ALKPHOS 92  BILITOT 0.8   ------------------------------------------------------------------------------------------------------------------  Cardiac Enzymes Recent Labs  Lab 12/29/17 1134  TROPONINI 0.18*   ------------------------------------------------------------------------------------------------------------------  RADIOLOGY:  Ct Head Wo Contrast  Result Date: 12/29/2017 CLINICAL DATA:  NG a, altered level of consciousness EXAM: CT HEAD WITHOUT CONTRAST TECHNIQUE: Contiguous axial images were obtained from the base of the skull through the vertex without intravenous contrast. COMPARISON:  11/15/2017 FINDINGS: Brain: There is atrophy and chronic small vessel disease changes. Old left frontal infarct, stable. No acute intracranial abnormality. Specifically, no hemorrhage, hydrocephalus, mass lesion, acute infarction, or significant intracranial injury. Vascular: No hyperdense vessel or unexpected calcification. Skull: No acute calvarial  abnormality. Sinuses/Orbits: Visualized paranasal sinuses and mastoids clear. Orbital soft tissues unremarkable. Other: None IMPRESSION: No acute intracranial abnormality. Atrophy, chronic microvascular disease. Old left frontal infarct. Electronically Signed   By: Charlett NoseKevin  Dover M.D.   On: 12/29/2017 12:12   Dg Chest Portable 1 View  Result Date: 12/29/2017 CLINICAL DATA:  Acute altered mental status. EXAM: PORTABLE CHEST 1 VIEW COMPARISON:  11/15/2017, 08/02/2017 and 07/29/2017 FINDINGS: There is new mild disc central a shin of the interstitial markings primarily on the right consistent with mild interstitial edema. Overall heart size is normal. Prosthetic aortic valve. Aortic atherosclerosis. Severe compression fracture of T7, increased since 08/02/2017 but probably not significantly changed since 11/15/2017. IMPRESSION: 1. Mild interstitial pulmonary edema. 2.  Aortic Atherosclerosis (ICD10-I70.0). Electronically Signed   By: Francene BoyersJames  Maxwell M.D.   On: 12/29/2017 12:11    EKG:    Atrial fibrillation 73 bpm intraventricular conduction delay  IMPRESSION AND PLAN:   1.  Severe hypernatremia with dementia.  This is a poor prognosis.  Full CODE STATUS.  Will get palliative care consultation.  IV fluids with D5W and serial sodiums.  Everything will depend on whether the patient is able to eat.  Even at the nursing facility she needs help with eating. 2.  Acute cystitis.  Rocephin ordered.  Follow-up urine culture which was ordered by me. 3.  Dementia, bedbound, poor appetite.  Overall prognosis is poor over the long-term. 4.  Mechanical heart valve.  Medication reconciliation still undergoing.  I ordered an INR.  Pharmacy to dose Coumadin. 5.  Paroxysmal atrial fibrillation.  On Coumadin. 6.  Depression on Wellbutrin Seroquel and Risperdal and Lexapro 7.  Hyperlipidemia unspecified on Lipitor  All the records are reviewed and case discussed with ED provider. Management plans discussed with the  patient, family and they are in agreement.  CODE STATUS: Full code  TOTAL TIME TAKING CARE OF THIS PATIENT: 50 minutes, including ACP time   Alford Highlandichard Deondria Puryear M.D on 12/29/2017 at 2:07 PM  Between 7am to 6pm - Pager - [phone]  After 6pm call admission pager [phone]  [facility]  [phone]  CC: Primary care physician; Malva LimesFisher, Donald E, MD

## 2017-12-29 NOTE — ED Notes (Signed)
Admitting MD at bedside.

## 2017-12-29 NOTE — Progress Notes (Signed)
Patient ID: Jonni SangerCecilia R [name], female   DOB: December 27, 1940, 77 y.o.   MRN: [id number]  ACP note  Patient and husband present  Diagnosis: Severe hyponatremia with dementia, acute cystitis, mechanical heart valve, paroxysmal atrial fibrillation, depression, hyperlipidemia  CODE STATUS discussed.  Patient wishes to be a full code.  Plan.  With severe hyponatremia and dementia her overall prognosis is poor and she is high risk for this developing again once IV fluids are stopped.  Everything will heal off her mental status and eating ability once we stop the IV fluids.  Palliative care consultation.  Time spent on ACP discussion 17 minutes Dr. Alford Highlandichard Lillianna Sabel

## 2017-12-29 NOTE — ED Notes (Signed)
Attempted to call report. RN will call back. 

## 2017-12-29 NOTE — ED Notes (Signed)
Per Tyron RussellYeni they will add on urine culture to urine already in lab

## 2017-12-29 NOTE — ED Notes (Signed)
Pt given 200cc bolus per MD Darnelle CatalanMalinda

## 2017-12-29 NOTE — Progress Notes (Signed)
ANTICOAGULATION CONSULT NOTE - Initial Consult  Pharmacy Consult for Warfarin Indication: Mechanical Mitral Valve  Allergies  Allergen Reactions  . Codeine Other (See Comments)  . Aspirin     unknown  . Penicillins     Has patient had a PCN reaction causing immediate rash, facial/tongue/throat swelling, SOB or lightheadedness with hypotension: NO Has patient had a PCN reaction causing severe rash involving mucus membranes or skin necrosis: NO Has patient had a PCN reaction that required hospitalization NO Has patient had a PCN reaction occurring within the last 10 years: NO If all of the above answers are "NO", then may proceed with Cephalosporin use.    Patient Measurements: Height: 5\' 3"  (160 cm) Weight: 130 lb (59 kg) IBW/kg (Calculated) : 52.4 Heparin Dosing Weight:    Vital Signs: Temp: 99.4 F (37.4 C) (08/19 1131) Temp Source: Rectal (08/19 1131) BP: 109/45 (08/19 1430) Pulse Rate: 66 (08/19 1430)  Labs: Recent Labs    12/29/17 1134  HGB 10.5*  HCT 33.3*  PLT 192  LABPROT 33.0*  INR 3.26  CREATININE 1.88*  TROPONINI 0.18*    Estimated Creatinine Clearance: 20.7 mL/min (A) (by C-G formula based on SCr of 1.88 mg/dL (H)).   Medical History: Past Medical History:  Diagnosis Date  . Anxiety   . Carotid stenosis    a. Carotid US (9/14):  Bilat 1-39% => f/u PRN  . Dementia   . Depression   . Gastroesophageal reflux disease   . History of echocardiogram    Echo 2/17: mild LVH, EF 55-60%, mild to mod AS (mean 14 mmHg), mechanical MVR ok (mean 7 mmHgg), mild LAE, PASP 58 mmHg  . HLD (hyperlipidemia)   . HTN (hypertension)   . Mitral regurgitation    a. s/p Bjork Shiley mechanical mitral valve replacement in 1980; b. 08/2016 Echo: EF 60-65%, no rwma, mild AS, mod dil LA, no signif MR, PASP 60mmHg.  . 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 non-obstructive by cath 2007.  [name]. PAF (paroxysmal atrial fibrillation) (HCC)    on chronic coumadin  .  Paroxysmal atrial flutter (HCC)    s/p RFA x 2  . TIA (transient ischemic attack)   . Tobacco abuse    Assessment: Patient is a 77yo female admitted with altered mental status possibly due to UTI. Patient has h/o mechanical mitral valve replacement, on warfarin prior to admission. Patient was taking Warfarin 5mg , dose has been held since 12/24/17. INR on admission is 3.26  Goal of Therapy:  INR 2.5 - 3.5 Monitor platelets by anticoagulation protocol: Yes   Plan:  INR is currently in the therapeutic range but patient has not received warfarin since 8/14. Will order Warfarin 3mg  times one dose tonight. Daily INR checks.  Clovia CuffLisa Lindalee Huizinga, PharmD, BCPS 12/29/2017 3:23 PM

## 2017-12-29 NOTE — ED Triage Notes (Signed)
Pt comes via [facility] from [facility] with c/o AMS. EMS states 99.2 axillary, BP-110/34. Pt has hx of dementia. EMS states pt is usually verbal, but at this time she is nonverbal.

## 2017-12-30 DIAGNOSIS — N179 Acute kidney failure, unspecified: Secondary | ICD-10-CM

## 2017-12-30 DIAGNOSIS — R4182 Altered mental status, unspecified: Secondary | ICD-10-CM

## 2017-12-30 DIAGNOSIS — I5020 Unspecified systolic (congestive) heart failure: Secondary | ICD-10-CM

## 2017-12-30 DIAGNOSIS — E870 Hyperosmolality and hypernatremia: Principal | ICD-10-CM

## 2017-12-30 DIAGNOSIS — Z66 Do not resuscitate: Secondary | ICD-10-CM

## 2017-12-30 DIAGNOSIS — Z7189 Other specified counseling: Secondary | ICD-10-CM

## 2017-12-30 DIAGNOSIS — E43 Unspecified severe protein-calorie malnutrition: Secondary | ICD-10-CM

## 2017-12-30 DIAGNOSIS — F0150 Vascular dementia without behavioral disturbance: Secondary | ICD-10-CM

## 2017-12-30 DIAGNOSIS — N390 Urinary tract infection, site not specified: Secondary | ICD-10-CM

## 2017-12-30 DIAGNOSIS — Z515 Encounter for palliative care: Secondary | ICD-10-CM

## 2017-12-30 LAB — MAGNESIUM: MAGNESIUM: 1.9 mg/dL (ref 1.7–2.4)

## 2017-12-30 LAB — BASIC METABOLIC PANEL
ANION GAP: 3 — AB (ref 5–15)
BUN: 30 mg/dL — AB (ref 8–23)
CO2: 26 mmol/L (ref 22–32)
Calcium: 8.3 mg/dL — ABNORMAL LOW (ref 8.9–10.3)
Chloride: 129 mmol/L — ABNORMAL HIGH (ref 98–111)
Creatinine, Ser: 1.59 mg/dL — ABNORMAL HIGH (ref 0.44–1.00)
GFR, EST AFRICAN AMERICAN: 35 mL/min — AB (ref >60)
GFR, EST NON AFRICAN AMERICAN: 30 mL/min — AB (ref >60)
Glucose, Bld: 159 mg/dL — ABNORMAL HIGH (ref 70–99)
POTASSIUM: 3.2 mmol/L — AB (ref 3.5–5.1)
SODIUM: 158 mmol/L — AB (ref 135–145)

## 2017-12-30 LAB — CBC
HEMATOCRIT: 33.5 % — AB (ref 35.0–47.0)
Hemoglobin: 10.6 g/dL — ABNORMAL LOW (ref 12.0–16.0)
MCH: 28.1 pg (ref 26.0–34.0)
MCHC: 31.5 g/dL — ABNORMAL LOW (ref 32.0–36.0)
MCV: 89.4 fL (ref 80.0–100.0)
Platelets: 188 10*3/uL (ref 150–440)
RBC: 3.75 MIL/uL — AB (ref 3.80–5.20)
RDW: 17.6 % — AB (ref 11.5–14.5)
WBC: 7.4 10*3/uL (ref 3.6–11.0)

## 2017-12-30 LAB — PROTIME-INR
INR: 3.44
Prothrombin Time: 34.4 seconds — ABNORMAL HIGH (ref 11.4–15.2)

## 2017-12-30 LAB — SODIUM: Sodium: 152 mmol/L — ABNORMAL HIGH (ref 135–145)

## 2017-12-30 LAB — PHOSPHORUS: PHOSPHORUS: 3.2 mg/dL (ref 2.5–4.6)

## 2017-12-30 MED ORDER — WARFARIN SODIUM 1 MG PO TABS
1.0000 mg | ORAL_TABLET | Freq: Once | ORAL | Status: AC
  Administered 2017-12-30: 1 mg via ORAL
  Filled 2017-12-30: qty 1

## 2017-12-30 NOTE — Progress Notes (Signed)
[facility] [place] [facility]   PATIENT NAME: [name] [name]    MR#:  [id number]  DATE OF BIRTH:  12/24/1940  SUBJECTIVE:   Poor historian patient has baseline dementia no family in the room REVIEW OF SYSTEMS:   Review of Systems  Unable to perform ROS: Dementia    DRUG ALLERGIES:   Allergies  Allergen Reactions  . Codeine Other (See Comments)  . Aspirin     unknown  . Penicillins     Has patient had a PCN reaction causing immediate rash, facial/tongue/throat swelling, SOB or lightheadedness with hypotension: NO Has patient had a PCN reaction causing severe rash involving mucus membranes or skin necrosis: NO Has patient had a PCN reaction that required hospitalization NO Has patient had a PCN reaction occurring within the last 10 years: NO If all of the above answers are "NO", then may proceed with Cephalosporin use.    VITALS:  Blood pressure 93/63, pulse 66, temperature 97.6 F (36.4 C), temperature source Oral, resp. rate 16, height 5\' 3"  (1.6 m), weight 59 kg, SpO2 100 %.  PHYSICAL EXAMINATION:   Physical Exam  GENERAL:  77 y.o.-year-old patient lying in the bed with no acute distress. Thin.cachectic EYES: Pupils equal, round, reactive to light and accommodation. No scleral icterus. Extraocular muscles intact.  HEENT: Head atraumatic, normocephalic. Oropharynx and nasopharynx clear.  NECK:  Supple, no jugular venous distention. No thyroid enlargement, no tenderness.  LUNGS: Normal breath sounds bilaterally, no wheezing, rales, rhonchi. No use of accessory muscles of respiration.  CARDIOVASCULAR: S1, S2 normal. No murmurs, rubs, or gallops.  ABDOMEN: Soft, nontender, nondistended. Bowel sounds present. No organomegaly or mass.  EXTREMITIES: No cyanosis, clubbing or edema b/l.    NEUROLOGIC:moves all extremities PSYCHIATRIC:  patient is alert  SKIN: No obvious rash, lesion, or ulcer.   LABORATORY PANEL:  CBC Recent Labs  Lab  12/30/17 0429  WBC 7.4  HGB 10.6*  HCT 33.5*  PLT 188    Chemistries  Recent Labs  Lab 12/29/17 1134 12/30/17 0429  NA 169* 158*  K 3.6 3.2*  CL >130* 129*  CO2 25 26  GLUCOSE 78 159*  BUN 43* 30*  CREATININE 1.88* 1.59*  CALCIUM 8.5* 8.3*  MG  --  1.9  AST 35  --   ALT 27  --   ALKPHOS 92  --   BILITOT 0.8  --    Cardiac Enzymes Recent Labs  Lab 12/29/17 1134  TROPONINI 0.18*   RADIOLOGY:  Ct Head Wo Contrast  Result Date: 12/29/2017 CLINICAL DATA:  NG a, altered level of consciousness EXAM: CT HEAD WITHOUT CONTRAST TECHNIQUE: Contiguous axial images were obtained from the base of the skull through the vertex without intravenous contrast. COMPARISON:  11/15/2017 FINDINGS: Brain: There is atrophy and chronic small vessel disease changes. Old left frontal infarct, stable. No acute intracranial abnormality. Specifically, no hemorrhage, hydrocephalus, mass lesion, acute infarction, or significant intracranial injury. Vascular: No hyperdense vessel or unexpected calcification. Skull: No acute calvarial abnormality. Sinuses/Orbits: Visualized paranasal sinuses and mastoids clear. Orbital soft tissues unremarkable. Other: None IMPRESSION: No acute intracranial abnormality. Atrophy, chronic microvascular disease. Old left frontal infarct. Electronically Signed   By: Charlett NoseKevin  Dover M.D.   On: 12/29/2017 12:12   Dg Chest Portable 1 View  Result Date: 12/29/2017 CLINICAL DATA:  Acute altered mental status. EXAM: PORTABLE CHEST 1 VIEW COMPARISON:  11/15/2017, 08/02/2017 and 07/29/2017 FINDINGS: There is new mild disc central a shin of the interstitial  markings primarily on the right consistent with mild interstitial edema. Overall heart size is normal. Prosthetic aortic valve. Aortic atherosclerosis. Severe compression fracture of T7, increased since 08/02/2017 but probably not significantly changed since 11/15/2017. IMPRESSION: 1. Mild interstitial pulmonary edema. 2.  Aortic  Atherosclerosis (ICD10-I70.0). Electronically Signed   By: Francene BoyersJames  Maxwell M.D.   On: 12/29/2017 12:11   ASSESSMENT AND PLAN:   [name] [name]  is a 77 y.o. female with a known history of dementia brought in with altered mental status.  As per the husband she has not been eating well for the past week and a half.  Unable to get her even to drink anything over the last couple days.  With her dementia she is bedbound and long term resident at [facility]  1.  Severe hypernatremia with dementia.  This is a poor prognosis.  -  appreciate palliative care consultation-- [facility] DNR [facility]  -cont  IV fluids with D5W and serial sodiums.    2.  Acute cystitis.  Rocephin ordered.  Follow-up urine culture which was ordered by me. -change to oral kelfex  3.  Dementia, bedbound, poor appetite.  Overall prognosis is poor over the long-term.  4.  Mechanical heart valve.   Pharmacy to dose Coumadin.  5.  Paroxysmal atrial fibrillation.  On Coumadin.  6.  Depression on Wellbutrin Seroquel and Risperdal and Lexapro  7.  Hyperlipidemia unspecified on Lipitor  8. Severe Protein-Calorie Malnutrition  Follow dietitian recommendation  Case discussed with Care Management/Social Worker. Management plans discussed with the patient, family and they are in agreement.  CODE STATUS: DNR  DVT Prophylaxis: coumadin TOTAL TIME TAKING CARE OF THIS PATIENT: *25* minutes.  >50% time spent on counselling and coordination of care  POSSIBLE D/C IN *2-3* DAYS, DEPENDING ON CLINICAL CONDITION.  Note: This dictation was prepared with Dragon dictation along with smaller phrase technology. Any transcriptional errors that result from this process are unintentional.  Enedina FinnerSona Anysha Frappier M.D on 12/30/2017 at 2:24 PM  Between 7am to 6pm - Pager - [phone]  After 6pm go to [email or web address] - password [email or web address]  [facility]  Office  [phone]  CC: Primary care  physician; Malva LimesFisher, Donald E, MDPatient ID: [name] [name], female   DOB: [birth year], 77 y.o.   MRN: [id number]

## 2017-12-30 NOTE — Progress Notes (Signed)
ANTICOAGULATION CONSULT NOTE - Initial Consult  Pharmacy Consult for Warfarin Indication: Mechanical Mitral Valve  Allergies  Allergen Reactions  . Codeine Other (See Comments)  . Aspirin     unknown  . Penicillins     Has patient had a PCN reaction causing immediate rash, facial/tongue/throat swelling, SOB or lightheadedness with hypotension: NO Has patient had a PCN reaction causing severe rash involving mucus membranes or skin necrosis: NO Has patient had a PCN reaction that required hospitalization NO Has patient had a PCN reaction occurring within the last 10 years: NO If all of the above answers are "NO", then may proceed with Cephalosporin use.    Patient Measurements: Height: 5\' 3"  (160 cm) Weight: 130 lb (59 kg) IBW/kg (Calculated) : 52.4 Heparin Dosing Weight:    Vital Signs: Temp: 97.6 F (36.4 C) (08/20 0447) Temp Source: Oral (08/20 0447) BP: 93/63 (08/20 0907) Pulse Rate: 66 (08/20 0907)  Labs: Recent Labs    12/29/17 1134 12/30/17 0429  HGB 10.5* 10.6*  HCT 33.3* 33.5*  PLT 192 188  LABPROT 33.0* 34.4*  INR 3.26 3.44  CREATININE 1.88* 1.59*  TROPONINI 0.18*  --     Estimated Creatinine Clearance: 24.5 mL/min (A) (by C-G formula based on SCr of 1.59 mg/dL (H)).   Medical History: Past Medical History:  Diagnosis Date  . Anxiety   . Carotid stenosis    a. Carotid US (9/14):  Bilat 1-39% => f/u PRN  . Dementia   . Depression   . Gastroesophageal reflux disease   . History of echocardiogram    Echo 2/17: mild LVH, EF 55-60%, mild to mod AS (mean 14 mmHg), mechanical MVR ok (mean 7 mmHgg), mild LAE, PASP 58 mmHg  . HLD (hyperlipidemia)   . HTN (hypertension)   . Mitral regurgitation    a. s/p Bjork Shiley mechanical mitral valve replacement in 1980; b. 08/2016 Echo: EF 60-65%, no rwma, mild AS, mod dil LA, no signif MR, PASP 60mmHg.  . 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 non-obstructive by cath 2007.  [name]. PAF (paroxysmal atrial  fibrillation) (HCC)    on chronic coumadin  . Paroxysmal atrial flutter (HCC)    s/p RFA x 2  . TIA (transient ischemic attack)   . Tobacco abuse    Assessment: Patient is a 77yo female admitted with altered mental status possibly due to UTI. Patient has h/o mechanical mitral valve replacement, on warfarin prior to admission. Patient was taking Warfarin 5mg , dose has been held since 12/24/17. INR on admission is 3.26  8/19  INR 3.26   Warfarin 3 mg 8/20  INR 3.44  Goal of Therapy:  INR 2.5 - 3.5 Monitor platelets by anticoagulation protocol: Yes   Plan:  INR is currently in the therapeutic range but at upper end. Will order Warfarin 1mg  times one dose tonight. Daily INR checks.  Clovia CuffLisa Abryana Lykens, PharmD, BCPS 12/30/2017 9:31 AM

## 2017-12-30 NOTE — Evaluation (Addendum)
Clinical/Bedside Swallow Evaluation Patient Details  Name: Jonni SangerCecilia R Schank MRN: [id number] Date of Birth: 1940-06-27  Today's Date: 12/30/2017 Time: SLP Start Time (ACUTE ONLY): 1340 SLP Stop Time (ACUTE ONLY): 1440 SLP Time Calculation (min) (ACUTE ONLY): 60 min  Past Medical History:  Past Medical History:  Diagnosis Date  . Anxiety   . Carotid stenosis    a. Carotid US (9/14):  Bilat 1-39% => f/u PRN  . Dementia   . Depression   . Gastroesophageal reflux disease   . History of echocardiogram    Echo 2/17: mild LVH, EF 55-60%, mild to mod AS (mean 14 mmHg), mechanical MVR ok (mean 7 mmHgg), mild LAE, PASP 58 mmHg  . HLD (hyperlipidemia)   . HTN (hypertension)   . Mitral regurgitation    a. s/p Bjork Shiley mechanical mitral valve replacement in 1980; b. 08/2016 Echo: EF 60-65%, no rwma, mild AS, mod dil LA, no signif MR, PASP 60mmHg.  . 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 non-obstructive by cath 2007.  [name]. PAF (paroxysmal atrial fibrillation) (HCC)    on chronic coumadin  . Paroxysmal atrial flutter (HCC)    s/p RFA x 2  . TIA (transient ischemic attack)   . Tobacco abuse    Past Surgical History:  Past Surgical History:  Procedure Laterality Date  . A FLUTTER ABLATION     x 2  . ESOPHAGOGASTRODUODENOSCOPY N/A 06/04/2015   Procedure: ESOPHAGOGASTRODUODENOSCOPY (EGD);  Surgeon: Iva Booparl E Gessner, MD;  [facility];  Service: Endoscopy;  Laterality: N/A;  . HIP ARTHROPLASTY Left 05/07/2014   Procedure: ARTHROPLASTY MONOPOLAR HIP;  Surgeon: Eldred MangesMark C Yates, MD;  Location: [facility] OR;  Service: Orthopedics;  Laterality: Left;  . MITRAL VALVE REPLACEMENT     Bjork-Shiley valve placed for mitral regurgitation   HPI:  Pt is a 77 y.o. female with a known history of multiple medical issues including Dementia, GERD, HTN, TIA brought in with altered mental status.  As per the husband she has not been eating well for the past week and a half.  Unable to get her even to  drink anything over the last couple days.  With her dementia she is bedbound and able to recognize her husband and son.  Patient does answer few questions.  In the ER she was found to have an increased sodium of 169 and a creatinine of 1.88 and a urinalysis that was positive.    Assessment / Plan / Recommendation Clinical Impression  Pt appears to present w/ oropharyngeal phase dysphagia impacted by her declined Cognitive status and decreased awareness for task of oral intake. Pt requires Max verbal/tactile cues to direct attention to/accept po trials. This declined Cognitive presentation does increase risk for aspiration w/ oral intake. She requires full feeding assistance w/ all po's. Pt consumed trials of Nectar liquids via Cup and purees w/ SLP feeding her. No overt s/s of aspiration noted; vocal quality remained clear post trials when she exhibited mumbled speech. No decline in respiratory status during/post trials. Oral phase grossly wfl once she attended to bolus trials to accept them from utensil. Given min time, she manipulated the bolues (of each consistency) then transferred and swallowed. Oral clearing was adequate during trials; alternated food/liquid boluses to aid clearing. No gross OM weakness noted; impact from declined Cognitive status in general. Recommend a Dysphagia level 1 (PUREE) diet w/ NECTAR consistency liquids; aspiration precautions; feeding support at all meals. Pills in Puree - Crushed. Pt appears close to/at her baseline  per MD report. This would be the recommended diet at discharge d/t pt's declined Cognitive status and risk for aspiration. NSG updated, agreed. Addendum: MD informed that pt only accepted few po's during lunch meal today w/ much encouragement.  SLP Visit Diagnosis: Dysphagia, oropharyngeal phase (R13.12)    Aspiration Risk  Mild aspiration risk;Moderate aspiration risk;Risk for inadequate nutrition/hydration    Diet Recommendation  Dysphagia level 1 (PUREE)  w/ NECTAR consistency liquids; aspiration precautions; feeding support and encouragement at meals.   Medication Administration: Crushed with puree(for safest swallowing)    Other  Recommendations Recommended Consults: (Dietician f/u) Oral Care Recommendations: Oral care BID;Staff/trained caregiver to provide oral care Other Recommendations: Order thickener from pharmacy;Prohibited food (jello, ice cream, thin soups);Remove water pitcher;Have oral suction available   Follow up Recommendations None      Frequency and Duration (n/a)  (n/a)       Prognosis Prognosis for Safe Diet Advancement: Fair Barriers to Reach Goals: Cognitive deficits;Severity of deficits      Swallow Study   General Date of Onset: 12/29/17 HPI: Pt is a 77 y.o. female with a known history of multiple medical issues including Dementia, GERD, HTN, TIA brought in with altered mental status.  As per the husband she has not been eating well for the past week and a half.  Unable to get her even to drink anything over the last couple days.  With her dementia she is bedbound and able to recognize her husband and son.  Patient does answer few questions.  In the ER she was found to have an increased sodium of 169 and a creatinine of 1.88 and a urinalysis that was positive.  Type of Study: Bedside Swallow Evaluation Previous Swallow Assessment: none reported Diet Prior to this Study: Dysphagia 1 (puree);Nectar-thick liquids(ordered at admission) Temperature Spikes Noted: No(wbc 7.4) Respiratory Status: Nasal cannula(2 liters) History of Recent Intubation: No Behavior/Cognition: Cooperative;Confused;Distractible;Requires cueing(Awake; Fidgity) Oral Cavity Assessment: (adequate but limited assessment d/t Cognitive status/decline) Oral Care Completed by SLP: Recent completion by staff Oral Cavity - Dentition: Missing dentition Vision: (n/a) Self-Feeding Abilities: Total assist(d/t Cognitive decline) Patient Positioning:  Upright in bed Baseline Vocal Quality: Low vocal intensity(mumbled/muttered speech) Volitional Cough: Cognitively unable to elicit Volitional Swallow: Unable to elicit    Oral/Motor/Sensory Function Overall Oral Motor/Sensory Function: (appeared adequate for bolus management/clearing)   Ice Chips Ice chips: Not tested   Thin Liquid Thin Liquid: Not tested    Nectar Thick Nectar Thick Liquid: Within functional limits Presentation: Cup(fed; 10 trials) Other Comments: pt was distracted and required verbal/tactile cues to attend to/accept bolus trials    Honey Thick Honey Thick Liquid: Not tested   Puree Puree: Within functional limits Presentation: Spoon(fed; 10 trials) Other Comments: pt was distracted and required verbal/tactile cues to attend to/accept bolus trials    Solid     Solid: Not tested       Jerilynn SomKatherine Nathan Stallworth, MS, CCC-SLP Trig Mcbryar 12/30/2017,4:41 PM

## 2017-12-30 NOTE — Progress Notes (Signed)
Initial Nutrition Assessment  DOCUMENTATION CODES:   Severe malnutrition in context of chronic illness  INTERVENTION:   Pt at high refeeding risk; recommend monitor K, Mg and P daily   Vital Cuisine TID, each supplement provides 520kcal and 22g of protein.   Magic cup TID with meals, each supplement provides 290 kcal and 9 grams of protein  MVI daily   NUTRITION DIAGNOSIS:   Severe Malnutrition related to chronic illness(dementia) as evidenced by 18 percent weight loss in 5 months, moderate fat depletion, moderate muscle depletion.  GOAL:   Patient will meet greater than or equal to 90% of their needs  MONITOR:   PO intake, Supplement acceptance, Labs, Weight trends, Skin, I & O's  REASON FOR ASSESSMENT:   Low Braden    ASSESSMENT:   77 y.o. female with a known history of dementia brought in with altered mental status.     Unable to obtain any nutrition related history from patient r/t dementia. Pt familiar to nutrition department from recent previous admit. Pt with poor appetite and oral intake at baseline. Per chart, pt has begun holding food in her mouth now. Pt has lost 28lbs(18%) over the past 5 months; this is severe weight loss. Husband reports that pt used to drink chocolate Ensure but no longer gets these. Pt on dysphagia 1/nectar thick diet. RD will add nectar thick supplements on meal trays. Pt likely at high refeeding risk; recommend monitor K, Mg and P labs as pt on 5% dextrose.    Medications reviewed and include: ferrous sulfate, MVI, senokot, B12, Vit C, warfarin, ceftriaxone, 5% dextrose @100ml /hr  Labs reviewed: Na 158(H), K 3.2(L), Cl 129(L), BUN 30(H), creat 1.59(H) Hgb 10.6(L), Hct 33.5(L)  NUTRITION - FOCUSED PHYSICAL EXAM:    Most Recent Value  Orbital Region  Mild depletion  Upper Arm Region  Moderate depletion  Thoracic and Lumbar Region  Mild depletion  Buccal Region  Mild depletion  Temple Region  Mild depletion  Clavicle Bone Region   Moderate depletion  Clavicle and Acromion Bone Region  Moderate depletion  Scapular Bone Region  Mild depletion  Dorsal Hand  Mild depletion  Patellar Region  Moderate depletion  Anterior Thigh Region  Moderate depletion  Posterior Calf Region  Moderate depletion  Edema (RD Assessment)  None  Hair  Reviewed  Eyes  Reviewed  Mouth  Reviewed  Skin  Reviewed  Nails  Reviewed     Diet Order:   Diet Order            DIET - DYS 1 Room service appropriate? Yes; Fluid consistency: Nectar Thick  Diet effective now             DUCATION NEEDS:   No education needs have been identified at this time  Skin:  Skin Assessment: Reviewed RN Assessment(Stage II buttocks )  Last BM:  pta  Height:   Ht Readings from Last 1 Encounters:  12/29/17 5\' 3"  (1.6 m)    Weight:   Wt Readings from Last 1 Encounters:  12/29/17 59 kg    Ideal Body Weight:  52.3 kg  BMI:  Body mass index is 23.03 kg/m.  Estimated Nutritional Needs:   Kcal:  1400-1600kcal/day   Protein:  88-100g/day   Fluid:  >1.4L/day   Betsey Holidayasey Ceilidh Torregrossa MS, RD, LDN Pager #- [phone] [phone]- [phone] After Hours Pager: [phone]

## 2017-12-31 LAB — SODIUM
SODIUM: 144 mmol/L (ref 135–145)
Sodium: 150 mmol/L — ABNORMAL HIGH (ref 135–145)

## 2017-12-31 LAB — URINE CULTURE
CULTURE: NO GROWTH
Special Requests: NORMAL

## 2017-12-31 LAB — PROTIME-INR
INR: 5.47 — AB
Prothrombin Time: 49.4 seconds — ABNORMAL HIGH (ref 11.4–15.2)

## 2017-12-31 NOTE — Progress Notes (Signed)
[facility] [place] [facility]   PATIENT NAME: Beverely LowCecilia Terry    MR#:  [id number]  DATE OF BIRTH:  Oct 05, 1940  SUBJECTIVE:   Poor historian patient has baseline dementia no family in the room REVIEW OF SYSTEMS:   Review of Systems  Unable to perform ROS: Dementia    DRUG ALLERGIES:   Allergies  Allergen Reactions  . Codeine Other (See Comments)  . Aspirin     unknown  . Penicillins     Has patient had a PCN reaction causing immediate rash, facial/tongue/throat swelling, SOB or lightheadedness with hypotension: NO Has patient had a PCN reaction causing severe rash involving mucus membranes or skin necrosis: NO Has patient had a PCN reaction that required hospitalization NO Has patient had a PCN reaction occurring within the last 10 years: NO If all of the above answers are "NO", then may proceed with Cephalosporin use.    VITALS:  Blood pressure (!) 91/45, pulse 67, temperature 97.7 F (36.5 C), resp. rate 14, height 5\' 3"  (1.6 m), weight 59 kg, SpO2 95 %.  PHYSICAL EXAMINATION:   Physical Exam  GENERAL:  77 y.o.-year-old patient lying in the bed with no acute distress. Thin.cachectic EYES: Pupils equal, round, reactive to light and accommodation. No scleral icterus. Extraocular muscles intact.  HEENT: Head atraumatic, normocephalic. Oropharynx and nasopharynx clear.  NECK:  Supple, no jugular venous distention. No thyroid enlargement, no tenderness.  LUNGS: Normal breath sounds bilaterally, no wheezing, rales, rhonchi. No use of accessory muscles of respiration.  CARDIOVASCULAR: S1, S2 normal. No murmurs, rubs, or gallops.  ABDOMEN: Soft, nontender, nondistended. Bowel sounds present. No organomegaly or mass.  EXTREMITIES: No cyanosis, clubbing or edema b/l.    NEUROLOGIC:moves all extremities PSYCHIATRIC:  patient is alert  SKIN: No obvious rash.   LABORATORY PANEL:  CBC Recent Labs  Lab 12/30/17 0429  WBC 7.4  HGB 10.6*  HCT  33.5*  PLT 188    Chemistries  Recent Labs  Lab 12/29/17 1134 12/30/17 0429  12/31/17 0508  NA 169* 158*   < > 150*  K 3.6 3.2*  --   --   CL >130* 129*  --   --   CO2 25 26  --   --   GLUCOSE 78 159*  --   --   BUN 43* 30*  --   --   CREATININE 1.88* 1.59*  --   --   CALCIUM 8.5* 8.3*  --   --   MG  --  1.9  --   --   AST 35  --   --   --   ALT 27  --   --   --   ALKPHOS 92  --   --   --   BILITOT 0.8  --   --   --    < > = values in this interval not displayed.   Cardiac Enzymes Recent Labs  Lab 12/29/17 1134  TROPONINI 0.18*   RADIOLOGY:  Ct Head Wo Contrast  Result Date: 12/29/2017 CLINICAL DATA:  NG a, altered level of consciousness EXAM: CT HEAD WITHOUT CONTRAST TECHNIQUE: Contiguous axial images were obtained from the base of the skull through the vertex without intravenous contrast. COMPARISON:  11/15/2017 FINDINGS: Brain: There is atrophy and chronic small vessel disease changes. Old left frontal infarct, stable. No acute intracranial abnormality. Specifically, no hemorrhage, hydrocephalus, mass lesion, acute infarction, or significant intracranial injury. Vascular: No hyperdense vessel or unexpected calcification.  Skull: No acute calvarial abnormality. Sinuses/Orbits: Visualized paranasal sinuses and mastoids clear. Orbital soft tissues unremarkable. Other: None IMPRESSION: No acute intracranial abnormality. Atrophy, chronic microvascular disease. Old left frontal infarct. Electronically Signed   By: Charlett NoseKevin  Dover M.D.   On: 12/29/2017 12:12   Dg Chest Portable 1 View  Result Date: 12/29/2017 CLINICAL DATA:  Acute altered mental status. EXAM: PORTABLE CHEST 1 VIEW COMPARISON:  11/15/2017, 08/02/2017 and 07/29/2017 FINDINGS: There is new mild disc central a shin of the interstitial markings primarily on the right consistent with mild interstitial edema. Overall heart size is normal. Prosthetic aortic valve. Aortic atherosclerosis. Severe compression fracture of T7,  increased since 08/02/2017 but probably not significantly changed since 11/15/2017. IMPRESSION: 1. Mild interstitial pulmonary edema. 2.  Aortic Atherosclerosis (ICD10-I70.0). Electronically Signed   By: Francene BoyersJames  Maxwell M.D.   On: 12/29/2017 12:11   ASSESSMENT AND PLAN:   Beverely LowCecilia Wieser  is a 77 y.o. female with a known history of dementia brought in with altered mental status.  As per the husband she has not been eating well for the past week and a half.  Unable to get her even to drink anything over the last couple days.  With her dementia she is bedbound and long term [facility] at [facility]  1.  Severe hypernatremia with dementia.  This is a poor prognosis.  -  appreciate palliative care consultation-- [facility]  -cont  IV fluids with D5W and serial sodiums.   -Na 169--158--152--150  2.  Abnormal UA -  Rocephin ordered.  Follow-up urine culture No growth -d/c abxs  3.  Dementia, bedbound, poor appetite.  Overall prognosis is poor over the long-term.  4.  Mechanical heart valve.   Pharmacy to dose Coumadin.  5.  Paroxysmal atrial fibrillation.  On Coumadin.  6.  Depression on Wellbutrin Seroquel and Risperdal and Lexapro  7.  Hyperlipidemia unspecified on Lipitor  8. Severe Protein-Calorie Malnutrition  Follow dietitian recommendation  Case discussed with Care Management/Social Worker. Management plans discussed with the patient, family and they are in agreement.  CODE STATUS: DNR  DVT Prophylaxis: coumadin TOTAL TIME TAKING CARE OF THIS PATIENT: *25* minutes.  >50% time spent on counselling and coordination of care  POSSIBLE D/C IN *2-3* DAYS, DEPENDING ON CLINICAL CONDITION.  Note: This dictation was prepared with Dragon dictation along with smaller phrase technology. Any transcriptional errors that result from this process are unintentional.  Enedina FinnerSona Constant [place] M.D on 12/31/2017 at 9:17 AM  Between 7am to 6pm - Pager -  [phone]  After 6pm go to [email or web address] - password [email or web address]  [facility]  Office  [phone]  CC: Primary care physician; Malva LimesFisher, Donald E, MDPatient ID: Jonni Sangerecilia R Bohnsack, female   DOB: 09/08/1940, 77 y.o.   MRN: [id number]

## 2017-12-31 NOTE — Progress Notes (Signed)
ANTICOAGULATION CONSULT NOTE - Initial Consult  Pharmacy Consult for Warfarin Indication: Mechanical Mitral Valve  Allergies  Allergen Reactions  . Codeine Other (See Comments)  . Aspirin     unknown  . Penicillins     Has patient had a PCN reaction causing immediate rash, facial/tongue/throat swelling, SOB or lightheadedness with hypotension: NO Has patient had a PCN reaction causing severe rash involving mucus membranes or skin necrosis: NO Has patient had a PCN reaction that required hospitalization NO Has patient had a PCN reaction occurring within the last 10 years: NO If all of the above answers are "NO", then may proceed with Cephalosporin use.    Patient Measurements: Height: 5\' 3"  (160 cm) Weight: 130 lb (59 kg) IBW/kg (Calculated) : 52.4 Heparin Dosing Weight:    Vital Signs: Temp: 97.7 F (36.5 C) (08/21 0614) BP: 91/45 (08/21 0634) Pulse Rate: 67 (08/21 0634)  Labs: Recent Labs    12/29/17 1134 12/30/17 0429 12/31/17 0508  HGB 10.5* 10.6*  --   HCT 33.3* 33.5*  --   PLT 192 188  --   LABPROT 33.0* 34.4* 49.4*  INR 3.26 3.44 5.47*  CREATININE 1.88* 1.59*  --   TROPONINI 0.18*  --   --     Estimated Creatinine Clearance: 24.5 mL/min (A) (by C-G formula based on SCr of 1.59 mg/dL (H)).   Medical History: Past Medical History:  Diagnosis Date  . Anxiety   . Carotid stenosis    a. Carotid US (9/14):  Bilat 1-39% => f/u PRN  . Dementia   . Depression   . Gastroesophageal reflux disease   . History of echocardiogram    Echo 2/17: mild LVH, EF 55-60%, mild to mod AS (mean 14 mmHg), mechanical MVR ok (mean 7 mmHgg), mild LAE, PASP 58 mmHg  . HLD (hyperlipidemia)   . HTN (hypertension)   . Mitral regurgitation    a. s/p Bjork Shiley mechanical mitral valve replacement in 1980; b. 08/2016 Echo: EF 60-65%, no rwma, mild AS, mod dil LA, no signif MR, PASP 60mmHg.  . 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 non-obstructive by cath 2007.  [name]. PAF  (paroxysmal atrial fibrillation) (HCC)    on chronic coumadin  . Paroxysmal atrial flutter (HCC)    s/p RFA x 2  . TIA (transient ischemic attack)   . Tobacco abuse    Assessment: Patient is a 77yo female admitted with altered mental status possibly due to UTI. Patient has h/o mechanical mitral valve replacement, on warfarin prior to admission. Patient was taking Warfarin 5mg , dose has been held since 12/24/17. INR on admission is 3.26           INR Dose 8/19  3.26 3 mg 8/20  3.44 1 mg 8/21  5.47 HOLD  Goal of Therapy:  INR 2.5 - 3.5 Monitor platelets by anticoagulation protocol: Yes   Plan:  INR is currently supratherapeutic. Will hold warfarin today. Daily INR checks.  Burnis Medinodney Charmain Diosdado, PhamD 12/31/2017 10:57 AM

## 2017-12-31 NOTE — Progress Notes (Signed)
Patient ID: [name] SangerCecilia R Moffit, female   DOB: 11/11/1940, 77 y.o.   MRN: [id number]  Per RN husband feels pt is getting more sleepy after taking Divalproex. HE would like it to be discontinued.

## 2017-12-31 NOTE — [place] FL2 (Signed)
[place] MEDICAID FL2 LEVEL OF CARE SCREENING TOOL     IDENTIFICATION  Patient Name: [name] [name] Birthdate: [birth year] Sex: female Admission Date (Current Location): 12/29/2017  [facility] [place]   Facility and Address:  [facility], [address], [place], [place] [address]      Provider Number: [id number]  Attending Physician Name and Address:  Enedina FinnerPatel, Sona, MD  Relative Name and Phone Number:       Current Level of Care: Hospital Recommended Level of Care: Skilled Nursing Facility Prior Approval Number:    Date Approved/Denied:   PASRR Number:    Discharge Plan: SNF    Current Diagnoses: Patient Active Problem List   Diagnosis Date Noted  . Protein-calorie malnutrition, severe 12/30/2017  . Hypernatremia 12/29/2017  . Malnutrition of moderate degree 11/18/2017  . Pressure injury of skin 11/18/2017  . Sepsis (HCC) 11/15/2017  . Acute renal failure (ARF) (HCC) 11/15/2017  . Encephalopathy acute 07/29/2017  . Valvular heart disease 06/13/2017  . 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 Coronary artery disease involving native coronary artery of native heart without angina pectoris 06/16/2015  . Aspiration pneumonia (HCC) 06/09/2015  . Atrial flutter (HCC) 06/07/2015  . Hypokalemia 06/07/2015  . Non-occlusive coronary artery disease 06/07/2015  . Acute upper gastrointestinal bleeding   . Erosive gastritis with hemorrhage   . 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 H/O prosthetic heart valve 03/27/2015  . Insomnia 03/27/2015  . 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 BP (high blood pressure) 03/27/2015  . H/O transient cerebral ischemia 03/27/2015  . Arthritis, degenerative 03/27/2015  . Closed fracture of hip (HCC) 03/27/2015  . Anxiety 03/27/2015  . Hyperlipemia 03/27/2015  . Depression 10/17/2014  . Dementia 05/06/2014  . Fracture of femoral neck, left (HCC) 05/06/2014  . Displaced fracture of  left femoral neck (HCC) 05/05/2014  . Encounter for therapeutic drug monitoring 06/09/2013  . Carotid stenosis 04/08/2011  . Transient ischemic attack (TIA) 08/07/2010  . TOBACCO ABUSE 09/21/2008  . Essential hypertension, benign 09/21/2008  . PAF (paroxysmal atrial fibrillation) (HCC) 09/21/2008    Orientation RESPIRATION BLADDER Height & Weight     Self  Normal Incontinent Weight: 130 lb (59 kg) Height:  5\' 3"  (160 cm)  BEHAVIORAL SYMPTOMS/MOOD NEUROLOGICAL BOWEL NUTRITION STATUS  (none) (none) Incontinent Diet(dysphagia 1)  AMBULATORY STATUS COMMUNICATION OF NEEDS Skin   Total Care Verbally PU Stage and Appropriate Care                       Personal Care Assistance Level of Assistance  Total care           Functional Limitations Info  Hearing   Hearing Info: Impaired      SPECIAL CARE FACTORS FREQUENCY                       Contractures Contractures Info: Not present    Additional Factors Info  Code Status Code Status Info: dnr             Current Medications (12/31/2017):  This is the current hospital active medication list Current Facility-Administered Medications  Medication Dose Route Frequency Provider Last Rate Last Dose  . acetaminophen (TYLENOL) tablet 650 mg  650 mg Oral Q6H PRN Alford HighlandWieting, Richard, MD       Or  . acetaminophen (TYLENOL) suppository 650 mg  650 mg Rectal Q6H PRN Alford HighlandWieting, Richard, MD      . amiodarone (PACERONE)  tablet 200 mg  200 mg Oral Daily Alford HighlandWieting, Richard, MD   200 mg at 12/31/17 0903  . atorvastatin (LIPITOR) tablet 40 mg  40 mg Oral Daily Alford HighlandWieting, Richard, MD   40 mg at 12/31/17 0903  . dextrose 5 % solution   Intravenous Continuous Alford HighlandWieting, Richard, MD 100 mL/hr at 12/31/17 1358    . escitalopram (LEXAPRO) tablet 20 mg  20 mg Oral Daily Alford HighlandWieting, Richard, MD   20 mg at 12/31/17 0902  . ferrous sulfate tablet 325 mg  325 mg Oral Q breakfast Alford HighlandWieting, Richard, MD   325 mg at 12/31/17 0855  . metoprolol tartrate  (LOPRESSOR) tablet 12.5 mg  12.5 mg Oral BID Alford HighlandWieting, Richard, MD      . multivitamin with minerals tablet 1 tablet  1 tablet Oral Daily Alford HighlandWieting, Richard, MD   1 tablet at 12/31/17 0902  . ondansetron (ZOFRAN) tablet 4 mg  4 mg Oral Q6H PRN Alford HighlandWieting, Richard, MD       Or  . ondansetron [facility](ZOFRAN) injection 4 mg  4 mg Intravenous Q6H PRN Wieting, Richard, MD      . risperiDONE (RISPERDAL) tablet 2 mg  2 mg Oral QHS Alford HighlandWieting, Richard, MD   2 mg at 12/30/17 2215  . senna (SENOKOT) tablet 17.2 mg  2 tablet Oral BID Alford HighlandWieting, Richard, MD   17.2 mg at 12/31/17 0902  . silver sulfADIAZINE (SILVADENE) 1 % cream 1 application  1 application Topical BID Alford HighlandWieting, Richard, MD   1 application at 12/31/17 [id number]  . traZODone (DESYREL) tablet 100 mg  100 mg Oral QHS Alford HighlandWieting, Richard, MD   100 mg at 12/30/17 2215  . vitamin B-12 (CYANOCOBALAMIN) tablet 500 mcg  500 mcg Oral Daily Alford HighlandWieting, Richard, MD   500 mcg at 12/31/17 0855  . vitamin C (ASCORBIC ACID) tablet 500 mg  500 mg Oral Daily Alford HighlandWieting, Richard, MD   500 mg at 12/31/17 0903  . Warfarin - Pharmacist Dosing Inpatient   Does not apply q1800 Alford HighlandWieting, Richard, MD         Discharge Medications: Please see discharge summary for a list of discharge medications.  Relevant Imaging Results:  Relevant Lab Results:   Additional Information [facility] to follow post discharge from hospital  [facility] Ashiah Karpowicz, LCSW

## 2017-12-31 NOTE — Consult Note (Signed)
Consultation Note Date: 12/31/2017   Patient Name: [name] [name]  DOB: [birth year]  MRN: [id number]  Age / Sex: 77 y.o., female  PCP: Birdie Sons, MD Referring Physician: Fritzi Mandes, MD  Reason for Consultation: Establishing goals of care  HPI/Patient Profile: 77 y.o. female admitted on 12/29/2017 from [facility] with complaints of altered mental status. She has a past medical history significant for dementia, anxiety, depression, GERD, EF 55-60%, hypertension, hyperlipidemia, mitral valve replacement (1980), TIA, and atrial fibrillation. Patient is unable to provide history. Her husband reported that she has not been eating well for over 2 weeks prior to admission and that they have been unable to get her to drink anything for several days prior to admission. During her ED course her patient remained altered. Her NA was 169. Creatinine 1.88. UA positive. Since admission patient continues to have poor appetite. She is receiving Rocephin with pending urine cultures. Palliative Medicine team consulted for goals of care discussion.    Clinical Assessment and Goals of Care: I have reviewed medical records including lab results, imaging, Epic notes, and MAR, received report from the bedside RN, and assessed the patient. I then met at the bedside with patient's husband, Zuzu Befort to discuss diagnosis prognosis, [place], EOL wishes, disposition and options. Patient has hx of dementia. She is alert and oriented to self and husband. She was able to verbalized that she was at the hospital also. Husband feels patient can engage in goals of care conversation and have some input in areas.   I introduced Palliative Medicine as specialized medical care for people living with serious illness. It focuses on providing relief from the symptoms and stress of a serious illness. The goal is to improve quality of life for both  the patient and the family.  We discussed a brief life review of the patient. Patient and her husband has been married for 46 years.  They have 2 children.  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  Husband reports patient lost to be outside and gardening.  As far as functional and nutritional status husband states he has noticed more of a decline in her health over the past 6 to 8 weeks.  He reports she was placed at [facility] back in March 2019 due to her advanced dementia and his inability to care for her full-time in their home.  Husband states patient has not walked since March after becoming sick and spending time in the hospital. She relies on assistance with ADLs. He says at baseline patient is alert to him and his son. She generally has a good appetite, however over the past 2-3 weeks patient would hardly eat or drink anything and has been sleeping more. During conversation patient stated "I don't want to eat that's why!"   We discussed her current illness and what it means in the larger context of her on-going co-morbidities.  Natural disease trajectory and expectations at EOL were discussed. Husband verbalized understanding of patient's current illness and the  dementia process. He remains hopefull that she will show signs of improvement and return to her baseline. We talked in great detail about the signs and expectations in regards to the dementia process. He verbalized understanding.   I attempted to elicit values and goals of care important to the patient and her husband.   The difference between aggressive medical intervention and comfort care was considered in light of the patient's goals of care. Mr. carl bleecker that he would like to continue to treat the treatable. He is hopeful that once her UTI clears up that maybe she will regain an appetite. He feels as though her mental status has improved.   Advanced directives, concepts specific to code status,  artifical feeding and hydration, and rehospitalization were considered and discussed. Husband reports patient would not want any forms of artificial feedings such as PEG tube. He asked appropriate questions about the risk and benefits of PEG tube as well as CPR and intubation. We had a detailed conversation of PEG tube placement in dementia patients. We discussed CPR and intubation given into context of her current illness and co-morbidities. Husband verbalized that he and his wife had discussed these topics during her early onset of dementia and she did expressed to him not wanting CPR or other life sustaining measures including PEG or intubation. He states this is not what he would want for her either. Husband wanted me to ask her how she felt about CPR and intubation. I attempted to explain given her dementia and altered mental status patient could not competently make this decision. He verbalized understanding but felt she understood what we were discussion and wanted her input. When discussed with patient and asked would she want CPR or intubation. Patient verbalized "I don't ever want that and I don't want no machines." Husband was satisfied with response and patient's ability to state her wishes. At this time patient's husband has requested patient be a DNR/DNI. I explained to him that staff would place a purple bracelet on patient as a way to identify their wishes to all of the medical team. He verbalized understanding.   Hospice and Palliative Care services outpatient were explained and offered. We discussed the goals and care of both hospice and palliative. Mr. Meske verbalized understanding. He verbalizes his wishes to continue to treat the treatable, however, he knows that she will not get any better and if she does not increase her nutritional input that she would only continue to decline. At this time he is requesting to have outpatient hospice services to be involved in her care once discharged  back to [facility].   Questions and concerns were addressed.  Hard Choices booklet left for review. The family was encouraged to call with questions or concerns.  PMT will continue to support holistically.  Primary Decision Maker: HCPOA-John Regino, husband    SUMMARY OF RECOMMENDATIONS    DNR/DNI-as requested by husband (POA)  Continue to treat the treatable without escalation of care. Husband remains hopeful that patient will show signs of improvement and return to baseline. He is hopeful that patient's appetite will increase.   Discussed with husband Dietitian's recommendations and orders. He verbalized understanding of TID supplements and MVI.   CSW for outpatient hospice at [facility].   Palliative Medicine team will continue to support patient, family, and medical team during hospitalization.   Code Status/Advance Care Planning:  DNR/DNI  Palliative Prophylaxis:   Aspiration, Bowel Regimen, Oral Care and Turn Reposition  Additional Recommendations (Limitations, Scope,  Preferences):  Full Scope Treatment-continue to treat the treatable without escalation of care.   Psycho-social/Spiritual:   Desire for further Chaplaincy support:NO   Prognosis:   Guarded to Poor in the setting of severe protein calorie malnutrition, 18% weight loss in 5 months, muscle depletion, bed bound, dementia, atrial fibrillation, depression, CAD, poor po intake, severe hypernatremia, dehydration, mechanical heart valve, and hyperlipidemia.    Discharge Planning: At husband's request patient to return to [place] at discharge with outpatient hospice.       Primary Diagnoses: Present on Admission: . Hypernatremia   I have reviewed the medical record, interviewed the patient and family, and examined the patient. The following aspects are pertinent.  Past Medical History:  Diagnosis Date  . Anxiety   . Carotid stenosis    a. Carotid US (9/14):  Bilat 1-39% => f/u PRN  .  Dementia   . Depression   . Gastroesophageal reflux disease   . History of echocardiogram    Echo 2/17: mild LVH, EF 55-60%, mild to mod AS (mean 14 mmHg), mechanical MVR ok (mean 7 mmHgg), mild LAE, PASP 58 mmHg  . HLD (hyperlipidemia)   . HTN (hypertension)   . Mitral regurgitation    a. s/p Bjork Shiley mechanical mitral valve replacement in 1980; b. 08/2016 Echo: EF 60-65%, no rwma, mild AS, mod dil LA, no signif MR, PASP 34mHg.  . 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 non-obstructive by cath 2007.  .Marland KitchenPAF (paroxysmal atrial fibrillation) (HCC)    on chronic coumadin  . Paroxysmal atrial flutter (HCC)    s/p RFA x 2  . TIA (transient ischemic attack)   . Tobacco abuse    Social History   Socioeconomic History  . Marital status: Married    Spouse name: Not on file  . Number of children: Not on file  . Years of education: Not on file  . Highest education level: Not on file  Occupational History  . Not on file  Social Needs  . Financial resource strain: Not on file  . Food insecurity:    Worry: Not on file    Inability: Not on file  . Transportation needs:    Medical: Not on file    Non-medical: Not on file  Tobacco Use  . Smoking status: Former Smoker    Packs/day: 0.25    Years: 35.00    Pack years: 8.75    Types: Cigarettes    Last attempt to quit: 08/25/2015    Years since quitting: 2.3  . Smokeless tobacco: Former UNetwork engineerand Sexual Activity  . Alcohol use: No  . Drug use: No  . Sexual activity: Not on file  Lifestyle  . Physical activity:    Days per week: Not on file    Minutes per session: Not on file  . Stress: Not on file  Relationships  . Social connections:    Talks on phone: Not on file    Gets together: Not on file    Attends religious service: Not on file    Active member of club or organization: Not on file    Attends meetings of clubs or organizations: Not on file    Relationship status: Not on file  Other Topics  Concern  . Not on file  Social History Narrative   Has one son who liver two doors down.    Family History  Problem Relation Age of Onset  . Dementia Mother   . CAD  Father   . Heart attack Other   . Hypertension Neg Hx   . Stroke Neg Hx    Scheduled Meds: . amiodarone  200 mg Oral Daily  . atorvastatin  40 mg Oral Daily  . divalproex  125 mg Oral BID  . escitalopram  20 mg Oral Daily  . ferrous sulfate  325 mg Oral Q breakfast  . metoprolol tartrate  12.5 mg Oral BID  . multivitamin with minerals  1 tablet Oral Daily  . risperiDONE  2 mg Oral QHS  . senna  2 tablet Oral BID  . silver sulfADIAZINE  1 application Topical BID  . traZODone  100 mg Oral QHS  . vitamin B-12  500 mcg Oral Daily  . vitamin C  500 mg Oral Daily  . Warfarin - Pharmacist Dosing Inpatient   Does not apply q1800   Continuous Infusions: . dextrose 100 mL/hr at 12/31/17 0436   PRN Meds:.acetaminophen **OR** acetaminophen, ondansetron **OR** ondansetron (ZOFRAN) IV Medications Prior to Admission:  Prior to Admission medications   Medication Sig Start Date End Date Taking? Authorizing Provider  amiodarone (PACERONE) 200 MG tablet Take 1 tablet (200 mg total) by mouth daily. 03/07/17  Yes Theora Gianotti, NP  Ascorbic Acid (VITAMIN C) 500 MG tablet Take 500 mg by mouth daily.     Yes [provider]  atorvastatin (LIPITOR) 40 MG tablet TAKE 1 TABLET BY MOUTH ONCE A DAY 07/29/16  Yes Birdie Sons, MD  Cyanocobalamin (VITAMIN B 12) 250 MCG LOZG Take 500 mg by mouth daily.     Yes [provider]  divalproex (DEPAKOTE) 125 MG DR tablet Take 125 mg by mouth 2 (two) times daily.   Yes [provider]  ERTAPENEM SODIUM IV Inject 1 g into the vein daily. 12/27/17 01/02/18 Yes [provider]  escitalopram (LEXAPRO) 20 MG tablet TAKE 1 TABLET BY MOUTH ONCE DAILY 02/28/17  Yes Birdie Sons, MD  ferrous sulfate 325 (65 FE) MG tablet Take 325 mg by mouth daily with  breakfast.   Yes [provider]  metoprolol tartrate (LOPRESSOR) 25 MG tablet Take 12.5 mg by mouth 2 (two) times daily.   Yes [provider]  Multiple Vitamin (MULTIVITAMIN WITH MINERALS) TABS tablet Take 1 tablet by mouth daily.   Yes [provider]  Multiple Vitamins-Calcium (EQL ONE DAILY WOMENS PO) Take 1 tablet by mouth daily.     Yes [provider]  QUEtiapine (SEROQUEL) 25 MG tablet Take 25 mg by mouth 2 (two) times daily.  11/14/17  Yes [provider]  risperiDONE (RISPERDAL) 1 MG tablet Take 2 mg by mouth at bedtime. 09/25/17  Yes [provider]  senna (SENOKOT) 8.6 MG TABS tablet Take 2 tablets by mouth 2 (two) times daily.   Yes [provider]  silver sulfADIAZINE (SILVADENE) 1 % cream Apply 1 application topically 2 (two) times daily. sacrum   Yes [provider]  traZODone (DESYREL) 100 MG tablet TAKE 1 TABLET BY MOUTH AT BEDTIME 07/03/17  Yes Birdie Sons, MD  warfarin (COUMADIN) 5 MG tablet Take 5 mg by mouth daily.   Yes [provider]   Allergies  Allergen Reactions  . Codeine Other (See Comments)  . Aspirin     unknown  . Penicillins     Has patient had a PCN reaction causing immediate rash, facial/tongue/throat swelling, SOB or lightheadedness with hypotension: NO Has patient had a PCN reaction causing severe rash involving  mucus membranes or skin necrosis: NO Has patient had a PCN reaction that required hospitalization NO Has patient had a PCN reaction occurring within the last 10 years: NO If all of the above answers are "NO", then may proceed with Cephalosporin use.   Review of Systems  Neurological: Positive for weakness.  All other systems reviewed and are negative.   Physical Exam  Constitutional: Vital signs are normal. She is cooperative. She appears ill.  Thin and frail in appearance   Cardiovascular: Normal rate, regular rhythm, normal heart sounds and normal pulses.    Pulmonary/Chest: Effort normal. She has decreased breath sounds.  Abdominal: Soft. Normal appearance and bowel sounds are normal.  Musculoskeletal:  bedbound   Neurological: She is alert. She is disoriented.  Alert to self and husband, muffled speech   Skin: Skin is warm and dry. Bruising noted.  Psychiatric: Cognition and memory are impaired. She expresses inappropriate judgment.  Nursing note and vitals reviewed.   Vital Signs: BP (!) 91/45 (BP Location: Left Arm)   Pulse 67   Temp 97.7 F (36.5 C)   Resp 14   Ht '5\' 3"'$  (1.6 m)   Wt 59 kg   SpO2 95%   BMI 23.03 kg/m  Pain Scale: PAINAD   Pain Score: 0-No pain   SpO2: SpO2: 95 % O2 Device:SpO2: 95 % O2 Flow Rate: .O2 Flow Rate (L/min): 2 L/min  IO: Intake/output summary:   Intake/Output Summary (Last 24 hours) at 12/31/2017 1131 Last data filed at 12/31/2017 0616 Gross per 24 hour  Intake 2073.64 ml  Output 850 ml  Net 1223.64 ml    LBM: Last BM Date: (pt unsure) Baseline Weight: Weight: 59 kg Most recent weight: Weight: 59 kg     Palliative Assessment/Data: PPS 20 %   Time In: 1230 Time Out: 1345 Time Total: 75 min.   Greater than 50%  of this time was spent counseling and coordinating care related to the above assessment and plan.  Signed by:  Alda Lea, NP-BC Palliative Medicine Team  Phone: [phone] Fax: [phone] Pager: [phone] Amion: Bjorn Pippin    Please contact Palliative Medicine Team phone at [phone] for questions and concerns.  For individual provider: See Shea Evans

## 2017-12-31 NOTE — Progress Notes (Signed)
New referral for [facility] at [facility] received from [place] following a Palliative Medicine consult. Patient is a 77 year old woman with a PMH of dementia, anxiety, carotid artery stenosis, GERD, depression, mitral valve replacement (1980), TIA and atrial fibrillation, admitted to [facility] on 8/19 with al;tered mental status, hypernatremia. She was admitted for treatment of a UTI. Since admission patient has continued with poor oral intake and altered mental status. Palliative Medicine was consulted for goals of care and met with patient's family yesterday. They have chosen for her to return to [facility] with the support of hospice services.Writer met in the room with Mr. Roulhac, patient seen lying in bed appeared to be sleeping. Writer initiated education regarding hospice services, philosophy and team approach to care. Hospice information and contact number given. Patient information faxed to referral. Plan is for discharge tomorrow, via EMS with signed DNR in place. Hospital team updated. Will continue to follow through discharge. Thank you for the opportunity to be involved in the care of this patient and her husband.  Flo Shanks RN, BSN, [place] [facility], hospital Liaison [phone]

## 2017-12-31 NOTE — Clinical Social Work Note (Signed)
Clinical Social Work Assessment  Patient Details  Name: [name] SangerCecilia R Barman MRN: [id number] Date of Birth: Jul 19, 1940  Date of referral:  12/31/17               Reason for consult:  End of Life/Hospice, Discharge Planning                Permission sought to share information with:    Permission granted to share information::     Name::        Agency::     Relationship::     Contact Information:     Housing/Transportation Living arrangements for the past 2 months:  Skilled Nursing Facility Source of Information:  Spouse, Palliative Care Team Patient Interpreter Needed:  None Criminal Activity/Legal Involvement Pertinent to Current Situation/Hospitalization:    Significant Relationships:  Adult Children, Spouse Lives with:  Facility Resident Do you feel safe going back to the place where you live?  Yes Need for family participation in patient care:  Yes (Comment)  Care giving concerns:  Patient is long term care at [facility].   Social Worker assessment / plan:  CSW spoke with patient's husband at patient's bedside. Patient with advanced dementia and sleeping during assessment. Patient's husband confirms that he wishes for patient to return to [facility] with hospice. Hospice choice was given and he stated he did not want [facility]. He was agreeable to [facility] [place]. Referral given to [facility] [place].  Employment status:    Insurance information:    PT Recommendations:    Information / Referral to community resources:     Patient/Family's Response to care:  Patient's husband expressed appreciation for CSW assistance.  Patient/Family's Understanding of and Emotional Response to Diagnosis, Current Treatment, and Prognosis:  Patient's husband is very loving towards his wife.   Emotional Assessment Appearance:  Appears older than stated age Attitude/Demeanor/Rapport:  (sleeping) Affect (typically observed):    Orientation:    Alcohol / Substance use:   Not Applicable Psych involvement (Current and /or in the community):  No (Comment)  Discharge Needs  Concerns to be addressed:  Care Coordination Readmission within the last 30 days:  No Current discharge risk:  None Barriers to Discharge:  No Barriers Identified   York SpanielMonica Keenan Dimitrov, LCSW 12/31/2017, 1:48 PM

## 2018-01-01 LAB — BASIC METABOLIC PANEL
Anion gap: 3 — ABNORMAL LOW (ref 5–15)
BUN: 17 mg/dL (ref 8–23)
CALCIUM: 8.2 mg/dL — AB (ref 8.9–10.3)
CO2: 24 mmol/L (ref 22–32)
CREATININE: 1.15 mg/dL — AB (ref 0.44–1.00)
Chloride: 117 mmol/L — ABNORMAL HIGH (ref 98–111)
GFR, EST AFRICAN AMERICAN: 52 mL/min — AB (ref >60)
GFR, EST NON AFRICAN AMERICAN: 45 mL/min — AB (ref >60)
Glucose, Bld: 103 mg/dL — ABNORMAL HIGH (ref 70–99)
Potassium: 2.9 mmol/L — ABNORMAL LOW (ref 3.5–5.1)
Sodium: 144 mmol/L (ref 135–145)

## 2018-01-01 LAB — PHOSPHORUS: Phosphorus: 3.1 mg/dL (ref 2.5–4.6)

## 2018-01-01 LAB — PROTIME-INR
INR: 4.81
Prothrombin Time: 44.7 seconds — ABNORMAL HIGH (ref 11.4–15.2)

## 2018-01-01 LAB — MAGNESIUM: Magnesium: 1.7 mg/dL (ref 1.7–2.4)

## 2018-01-01 LAB — POTASSIUM: Potassium: 4.5 mmol/L (ref 3.5–5.1)

## 2018-01-01 MED ORDER — POTASSIUM CHLORIDE 20 MEQ PO PACK
40.0000 meq | PACK | Freq: Once | ORAL | Status: AC
  Administered 2018-01-01: 40 meq via ORAL
  Filled 2018-01-01: qty 2

## 2018-01-01 MED ORDER — POTASSIUM CHLORIDE 2 MEQ/ML IV SOLN
INTRAVENOUS | Status: DC
  Administered 2018-01-01: 12:00:00 via INTRAVENOUS
  Filled 2018-01-01 (×2): qty 1000

## 2018-01-01 MED ORDER — POTASSIUM CHLORIDE 10 MEQ/100ML IV SOLN: 10.0000 meq | INTRAVENOUS | Status: DC

## 2018-01-01 NOTE — Clinical Social Work Note (Signed)
Physician informed CSW that patient would discharge tomorrow as she wants to monitor patient's electrolytes for another 24 hours. CSW informed Verlon AuLeslie at [facility]. York SpanielMonica Lysette Lindenbaum MSW,LCSW [phone]

## 2018-01-01 NOTE — Care Management Important Message (Signed)
Important Message  Patient Details  Name: [name] [name] MRN: [id number] Date of Birth: [birth year]   Medicare Important Message Given:  Yes    Twilla Khouri A Justinn Welter, RN 01/01/2018, 2:08 PM

## 2018-01-01 NOTE — Progress Notes (Signed)
Follow up visit made to new referral for [facility] at [facility]. Patient seen lying in bed, husband at bedside. Patient appeared to be sleeping, but did wake to voice. Mr. Rada HayMurrell was concerned about whether his wife had eaten lunch or not as he was not present. Per staff RN, yes she did and has drank thickened liquids at times through out the day, applesauce with meds. Potassium 2/9 this morning, patient held another day for electrolyte correction. Will continue to follow through final discharge. Dayna BarkerKaren Robertson RN, BSN, [facility], [place] liaison [phone]

## 2018-01-01 NOTE — Progress Notes (Addendum)
MEDICATION RELATED CONSULT NOTE  Pharmacy Consult for electrolyte replacement Indication: hypokalemia  Allergies  Allergen Reactions  . Codeine Other (See Comments)  . Aspirin     unknown  . Penicillins     Has patient had a PCN reaction causing immediate rash, facial/tongue/throat swelling, SOB or lightheadedness with hypotension: NO Has patient had a PCN reaction causing severe rash involving mucus membranes or skin necrosis: NO Has patient had a PCN reaction that required hospitalization NO Has patient had a PCN reaction occurring within the last 10 years: NO If all of the above answers are "NO", then may proceed with Cephalosporin use.    Patient Measurements: Height: 5\' 3"  (160 cm) Weight: 130 lb (59 kg) IBW/kg (Calculated) : 52.4  Vital Signs: Temp: 98.3 F (36.8 C) (08/22 0532) Temp Source: Oral (08/21 2126) BP: 105/38 (08/22 0535) Pulse Rate: 62 (08/22 0535) Intake/Output from previous day: 08/21 0701 - 08/22 0700 In: 1984.4 [I.V.:1984.4] Out: 900 [Urine:900] Intake/Output from this shift: No intake/output data recorded.  Labs: Recent Labs    12/29/17 1134 12/30/17 0429 01/01/18 0448  WBC 7.8 7.4  --   HGB 10.5* 10.6*  --   HCT 33.3* 33.5*  --   PLT 192 188  --   CREATININE 1.88* 1.59* 1.15*  MG  --  1.9  --   PHOS  --  3.2  --   ALBUMIN 2.2*  --   --   PROT 6.0*  --   --   AST 35  --   --   ALT 27  --   --   ALKPHOS 92  --   --   BILITOT 0.8  --   --    Estimated Creatinine Clearance: 33.9 mL/min (A) (by C-G formula based on SCr of 1.15 mg/dL (H)).  Medical History: Past Medical History:  Diagnosis Date  . Anxiety   . Carotid stenosis    a. Carotid US (9/14):  Bilat 1-39% => f/u PRN  . Dementia   . Depression   . Gastroesophageal reflux disease   . History of echocardiogram    Echo 2/17: mild LVH, EF 55-60%, mild to mod AS (mean 14 mmHg), mechanical MVR ok (mean 7 mmHgg), mild LAE, PASP 58 mmHg  . HLD (hyperlipidemia)   . HTN  (hypertension)   . Mitral regurgitation    a. s/p Bjork Shiley mechanical mitral valve replacement in 1980; b. 08/2016 Echo: EF 60-65%, no rwma, mild AS, mod dil LA, no signif MR, PASP 60mmHg.  . 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 non-obstructive by cath 2007.  [name]. PAF (paroxysmal atrial fibrillation) (HCC)    on chronic coumadin  . Paroxysmal atrial flutter (HCC)    s/p RFA x 2  . TIA (transient ischemic attack)   . Tobacco abuse    Assessment: 77 y.o.femalewith a h/o dementia admitted with AMS. She was found to be be extremely hypernatremic but has since almost resolved on D5. However, she is now hypokalemic. She has received no potassium replacement therapy prior to this note.  Goal of Therapy:  K 3.5-5.1 mmol/L  Plan:  Change IV fluids to D520 at 17000ml/hr. Give 4 runs IV KCl. Patient is not alert and oriented and is too drowsy for po replacement at this time. We will draw a level after IV runs are completed and replace as needed.  Addendum: The patient had an infiltrated IV while trying to infuse the the D520. This took place prior to the 4  runs of IV KCl. She was able to receive approximately 1/2 of the D520 prior to the infiltration but none of the IV IV KCl minibags. The patient was more alert around noon so we attempted a dose by mouth in applesauce, which was successful. We are now attempting a second dose at 1600 in the same manner. A level has been ordered for 1800.   Lowella Bandy, PharmD 01/01/2018,8:45 AM

## 2018-01-01 NOTE — Progress Notes (Signed)
[facility] [place] at [facility]   PATIENT NAME: [name] [name]    MR#:  [id number]  DATE OF BIRTH:  1940/11/11  SUBJECTIVE:   Poor historian patient has baseline dementia Husband in the room. Pt more awake today REVIEW OF SYSTEMS:   Review of Systems  Unable to perform ROS: Dementia    DRUG ALLERGIES:   Allergies  Allergen Reactions  . Codeine Other (See Comments)  . Aspirin     unknown  . Penicillins     Has patient had a PCN reaction causing immediate rash, facial/tongue/throat swelling, SOB or lightheadedness with hypotension: NO Has patient had a PCN reaction causing severe rash involving mucus membranes or skin necrosis: NO Has patient had a PCN reaction that required hospitalization NO Has patient had a PCN reaction occurring within the last 10 years: NO If all of the above answers are "NO", then may proceed with Cephalosporin use.    VITALS:  Blood pressure (!) 163/113, pulse (!) 165, temperature (!) 97.5 F (36.4 C), temperature source Axillary, resp. rate 16, height 5\' 3"  (1.6 m), weight 59 kg, SpO2 94 %.  PHYSICAL EXAMINATION:   Physical Exam  GENERAL:  77 y.o.-year-old patient lying in the bed with no acute distress. Thin.cachectic EYES: Pupils equal, round, reactive to light and accommodation. No scleral icterus. Extraocular muscles intact.  HEENT: Head atraumatic, normocephalic. Oropharynx and nasopharynx clear.  NECK:  Supple, no jugular venous distention. No thyroid enlargement, no tenderness.  LUNGS: Normal breath sounds bilaterally, no wheezing, rales, rhonchi. No use of accessory muscles of respiration.  CARDIOVASCULAR: S1, S2 normal. No murmurs, rubs, or gallops.  ABDOMEN: Soft, nontender, nondistended. Bowel sounds present. No organomegaly or mass.  EXTREMITIES: No cyanosis, clubbing or edema b/l.    NEUROLOGIC:moves all extremities PSYCHIATRIC:  patient is alert  SKIN: No obvious rash.   LABORATORY PANEL:   CBC Recent Labs  Lab 12/30/17 0429  WBC 7.4  HGB 10.6*  HCT 33.5*  PLT 188    Chemistries  Recent Labs  Lab 12/29/17 1134  01/01/18 0448 01/01/18 1850  NA 169*   < > 144  --   K 3.6   < > 2.9* 4.5  CL >130*   < > 117*  --   CO2 25   < > 24  --   GLUCOSE 78   < > 103*  --   BUN 43*   < > 17  --   CREATININE 1.88*   < > 1.15*  --   CALCIUM 8.5*   < > 8.2*  --   MG  --    < > 1.7  --   AST 35  --   --   --   ALT 27  --   --   --   ALKPHOS 92  --   --   --   BILITOT 0.8  --   --   --    < > = values in this interval not displayed.   Cardiac Enzymes Recent Labs  Lab 12/29/17 1134  TROPONINI 0.18*   RADIOLOGY:  No results found. ASSESSMENT AND PLAN:   [name] [name]  is a 77 y.o. female with a known history of dementia brought in with altered mental status.  As per the husband she has not been eating well for the past week and a half.  Unable to get her even to drink anything over the last couple days.  With her dementia she is  bedbound and long term resident at [facility]  1.  Severe hypernatremia/hypokalemia--corrected.  This is a poor prognosis.  -  appreciate palliative care consultation-- Husband request DNR and back to [facility] with hospice to follow  -cont  IV fluids with D5W and serial sodiums.   -Na 169--158--152--150--144--144  2.  Abnormal UA \  Follow-up urine culture No growth -d/c abxs  3.  Dementia, bedbound, poor appetite.  Overall prognosis is poor over the long-term.  4.  Mechanical heart valve.   Pharmacy to dose Coumadin.  5.  Paroxysmal atrial fibrillation.  On Coumadin.  6.  Depression on Wellbutrin Seroquel and Risperdal and Lexapro  7.  Hyperlipidemia unspecified on Lipitor  8. Severe Protein-Calorie Malnutrition  Follow dietitian recommendation  Case discussed with Care Management/Social Worker. Management plans discussed with the patient, family and they are in agreement.  CODE STATUS: DNR  DVT Prophylaxis: coumadin TOTAL  TIME TAKING CARE OF THIS PATIENT: *25* minutes.  >50% time spent on counselling and coordination of care  POSSIBLE D/C IN AM       DEPENDING ON CLINICAL CONDITION.  Note: This dictation was prepared with Dragon dictation along with smaller phrase technology. Any transcriptional errors that result from this process are unintentional.  Enedina Finner M.D on 01/01/2018 at 7:25 PM  Between 7am to 6pm - Pager - [phone]  After 6pm go to [email or web address] - password [email or web address]  [facility]  Office  [phone]  CC: Primary care physician; Malva Limes, MDPatient ID: [name], female   DOB: 1941-01-18, 77 y.o.   MRN: [id number]

## 2018-01-01 NOTE — Progress Notes (Signed)
MEDICATION RELATED CONSULT NOTE  Pharmacy Consult for electrolyte replacement Indication: hypokalemia  Allergies  Allergen Reactions  . Codeine Other (See Comments)  . Aspirin     unknown  . Penicillins     Has patient had a PCN reaction causing immediate rash, facial/tongue/throat swelling, SOB or lightheadedness with hypotension: NO Has patient had a PCN reaction causing severe rash involving mucus membranes or skin necrosis: NO Has patient had a PCN reaction that required hospitalization NO Has patient had a PCN reaction occurring within the last 10 years: NO If all of the above answers are "NO", then may proceed with Cephalosporin use.    Patient Measurements: Height: 5\' 3"  (160 cm) Weight: 130 lb (59 kg) IBW/kg (Calculated) : 52.4  Vital Signs: Temp: 97.5 F (36.4 C) (08/22 1221) Temp Source: Axillary (08/22 1221) BP: 163/113 (08/22 1221) Pulse Rate: 165 (08/22 1221) Intake/Output from previous day: 08/21 0701 - 08/22 0700 In: 1984.4 [I.V.:1984.4] Out: 900 [Urine:900] Intake/Output from this shift: No intake/output data recorded.  Labs: Recent Labs    12/30/17 0429 01/01/18 0448  WBC 7.4  --   HGB 10.6*  --   HCT 33.5*  --   PLT 188  --   CREATININE 1.59* 1.15*  MG 1.9 1.7  PHOS 3.2 3.1   Estimated Creatinine Clearance: 33.9 mL/min (A) (by C-G formula based on SCr of 1.15 mg/dL (H)).   Potassium (mmol/L)  Date Value  01/01/2018 4.5     Medical History: Past Medical History:  Diagnosis Date  . Anxiety   . Carotid stenosis    a. Carotid US (9/14):  Bilat 1-39% => f/u PRN  . Dementia   . Depression   . Gastroesophageal reflux disease   . History of echocardiogram    Echo 2/17: mild LVH, EF 55-60%, mild to mod AS (mean 14 mmHg), mechanical MVR ok (mean 7 mmHgg), mild LAE, PASP 58 mmHg  . HLD (hyperlipidemia)   . HTN (hypertension)   . Mitral regurgitation    a. s/p Bjork Shiley mechanical mitral valve replacement in 1980; b. 08/2016 Echo: EF  60-65%, no rwma, mild AS, mod dil LA, no signif MR, PASP .  . 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 non-obstructive by cath 2007.  [name] PAF (paroxysmal atrial fibrillation) (HCC)    on chronic coumadin  . Paroxysmal atrial flutter (HCC)    s/p RFA x 2  . TIA (transient ischemic attack)   . Tobacco abuse    Assessment: 77 y.o.femalewith a h/o dementia admitted with AMS. She was found to be be extremely hypernatremic but has since almost resolved on D5. However, she is now hypokalemic. She has received no potassium replacement therapy prior to this note.  Goal of Therapy:  K 3.5-5.1 mmol/L  Plan:  Change IV fluids to D520 at 130ml/hr. Give 4 runs IV KCl. Patient is not alert and oriented and is too drowsy for po replacement at this time. We will draw a level after IV runs are completed and replace as needed.  Addendum: The patient had an infiltrated IV while trying to infuse the the D520. This took place prior to the 4 runs of IV KCl. She was able to receive approximately 1/2 of the D520 prior to the infiltration but none of the IV IV KCl minibags. The patient was more alert around noon so we attempted a dose by mouth in applesauce, which was successful. We are now attempting a second dose at 1600 in  the same manner. A level has been ordered for 1800.   8/22 PM: K is WNL. Will f/u AM labs.   Valentina Guhristy, Allysia Ingles D, PharmD 01/01/2018,7:21 PM

## 2018-01-01 NOTE — Progress Notes (Signed)
ANTICOAGULATION CONSULT NOTE - Initial Consult  Pharmacy Consult for Warfarin Indication: Mechanical Mitral Valve  Allergies  Allergen Reactions  . Codeine Other (See Comments)  . Aspirin     unknown  . Penicillins     Has patient had a PCN reaction causing immediate rash, facial/tongue/throat swelling, SOB or lightheadedness with hypotension: NO Has patient had a PCN reaction causing severe rash involving mucus membranes or skin necrosis: NO Has patient had a PCN reaction that required hospitalization NO Has patient had a PCN reaction occurring within the last 10 years: NO If all of the above answers are "NO", then may proceed with Cephalosporin use.    Patient Measurements: Height: 5\' 3"  (160 cm) Weight: 130 lb (59 kg) IBW/kg (Calculated) : 52.4 Heparin Dosing Weight:    Vital Signs: Temp: 98.3 F (36.8 C) (08/22 0532) Temp Source: Oral (08/21 2126) BP: 105/38 (08/22 0535) Pulse Rate: 62 (08/22 0535)  Labs: Recent Labs    12/29/17 1134 12/30/17 0429 12/31/17 0508 01/01/18 0448  HGB 10.5* 10.6*  --   --   HCT 33.3* 33.5*  --   --   PLT 192 188  --   --   LABPROT 33.0* 34.4* 49.4* 44.7*  INR 3.26 3.44 5.47* 4.81*  CREATININE 1.88* 1.59*  --  1.15*  TROPONINI 0.18*  --   --   --     Estimated Creatinine Clearance: 33.9 mL/min (A) (by C-G formula based on SCr of 1.15 mg/dL (H)).   Medical History: Past Medical History:  Diagnosis Date  . Anxiety   . Carotid stenosis    a. Carotid US (9/14):  Bilat 1-39% => f/u PRN  . Dementia   . Depression   . Gastroesophageal reflux disease   . History of echocardiogram    Echo 2/17: mild LVH, EF 55-60%, mild to mod AS (mean 14 mmHg), mechanical MVR ok (mean 7 mmHgg), mild LAE, PASP 58 mmHg  . HLD (hyperlipidemia)   . HTN (hypertension)   . Mitral regurgitation    a. s/p Bjork Shiley mechanical mitral valve replacement in 1980; b. 08/2016 Echo: EF 60-65%, no rwma, mild AS, mod dil LA, no signif MR, PASP 60mmHg.  .  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 non-obstructive by cath 2007.  [name]. PAF (paroxysmal atrial fibrillation) (HCC)    on chronic coumadin  . Paroxysmal atrial flutter (HCC)    s/p RFA x 2  . TIA (transient ischemic attack)   . Tobacco abuse    Assessment: Patient is a 77yo female admitted with altered mental status possibly due to UTI. Patient has h/o mechanical mitral valve replacement, on warfarin prior to admission. Patient was taking Warfarin 5mg , dose has been held since 12/24/17. INR on admission is 3.26           INR Dose 8/19  3.26 3 mg 8/20  3.44 1 mg 8/21  5.47 HOLD 8/22  4.81 HOLD  Goal of Therapy:  INR 2.5 - 3.5 Monitor platelets by anticoagulation protocol: Yes   Plan:  INR is currently supratherapeutic but trending back down. No noted s/s of bleeding; H&H stable. Will hold warfarin today. Daily INR checks.  Burnis Medinodney Makilah Dowda, PhamD 01/01/2018 8:42 AM

## 2018-01-01 NOTE — Progress Notes (Signed)
Pt with critical INR of 4.81 this AM. MD notified.

## 2018-01-02 LAB — PROTIME-INR
INR: 4.42 — AB
Prothrombin Time: 41.8 seconds — ABNORMAL HIGH (ref 11.4–15.2)

## 2018-01-02 LAB — MAGNESIUM: Magnesium: 1.9 mg/dL (ref 1.7–2.4)

## 2018-01-02 LAB — PHOSPHORUS: Phosphorus: 2.8 mg/dL (ref 2.5–4.6)

## 2018-01-02 NOTE — Discharge Summary (Signed)
[facility] -  at [facility]   PATIENT NAME: Latoyna Hird    MR#:  [id number]  DATE OF BIRTH:  1940/09/22  DATE OF ADMISSION:  12/29/2017 ADMITTING PHYSICIAN: Alford Highland, MD  DATE OF DISCHARGE: 01/02/2018  PRIMARY CARE PHYSICIAN: Malva Limes, MD    ADMISSION DIAGNOSIS:  Hypernatremia [E87.0] Elevated troponin [R74.8] Vascular dementia without behavioral disturbance [F01.50] AKI (acute kidney injury) (HCC) [N17.9] Urinary tract infection without hematuria, site unspecified [N39.0] Altered mental status, unspecified altered mental status type [R41.82] Systolic congestive heart failure, unspecified HF chronicity (HCC) [I50.20]  DISCHARGE DIAGNOSIS:  Severe hypernatremia/hypokalemia due to poor po intake Advance Dementia Supratherapeutic INR--No bleeding SECONDARY DIAGNOSIS:   Past Medical History:  Diagnosis Date  . Anxiety   . Carotid stenosis    a. Carotid US (9/14):  Bilat 1-39% => f/u PRN  . Dementia   . Depression   . Gastroesophageal reflux disease   . History of echocardiogram    Echo 2/17: mild LVH, EF 55-60%, mild to mod AS (mean 14 mmHg), mechanical MVR ok (mean 7 mmHgg), mild LAE, PASP 58 mmHg  . HLD (hyperlipidemia)   . HTN (hypertension)   . Mitral regurgitation    a. s/p Bjork Shiley mechanical mitral valve replacement in 1980; b. 08/2016 Echo: EF 60-65%, no rwma, mild AS, mod dil LA, no signif MR, PASP .  . 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 non-obstructive by cath 2007.  [name] PAF (paroxysmal atrial fibrillation) (HCC)    on chronic coumadin  . Paroxysmal atrial flutter (HCC)    s/p RFA x 2  . TIA (transient ischemic attack)   . Tobacco abuse     HOSPITAL COURSE:  [name] [name] a76 y.o.femalewith a known history of dementia brought in with altered mental status. As per the husband she has not been eating well for the past week and a half. Unable to get her even to drink anything  over the last couple days. With her dementia she is bedbound and long term resident at [facility]  1. Severe hypernatremia/hypokalemia--corrected. This is a poor prognosis.  - appreciate palliative care consultation-- Husband request DNR and back to [facility] with hospice to follow  -cont IV fluids with D5W and serial sodiums.  -Na 169--158--152--150--144--144  2. Abnormal UA \ Follow-up urine culture No growth -d/c abxs  3. Dementia, bedbound, poor appetite. Overall prognosis is poor over the long-term.  4. Mechanical heart valve.  Pharmacy to dose Coumadin.currently on Hold due to INR 4.5 Pharmacy at [facility] to follow daily INR and resume dose to keep INR between 2.5-3.5  5. Paroxysmal atrial fibrillation. On Coumadin--currently on hold  6. Depression on Wellbutrin Seroquel , Risperdal and Lexapro  7. Hyperlipidemia unspecified on Lipitor  8. Severe Protein-Calorie Malnutrition  Follow dietitian recommendation and dietitian to follow at [facility].  Overall well hydrated and pt more perked up. Will d/c to [facility] with Hospice to follow  CONSULTS OBTAINED:    DRUG ALLERGIES:   Allergies  Allergen Reactions  . Codeine Other (See Comments)  . Aspirin     unknown  . Penicillins     Has patient had a PCN reaction causing immediate rash, facial/tongue/throat swelling, SOB or lightheadedness with hypotension: NO Has patient had a PCN reaction causing severe rash involving mucus membranes or skin necrosis: NO Has patient had a PCN reaction that required hospitalization NO Has patient had a PCN reaction occurring within the last 10 years: NO If all  of the above answers are "NO", then may proceed with Cephalosporin use.    DISCHARGE MEDICATIONS:   Allergies as of 01/02/2018      Reactions   Codeine Other (See Comments)   Aspirin    unknown   Penicillins    Has patient had a PCN reaction causing immediate rash, facial/tongue/throat  swelling, SOB or lightheadedness with hypotension: NO Has patient had a PCN reaction causing severe rash involving mucus membranes or skin necrosis: NO Has patient had a PCN reaction that required hospitalization NO Has patient had a PCN reaction occurring within the last 10 years: NO If all of the above answers are "NO", then may proceed with Cephalosporin use.      Medication List    STOP taking these medications   divalproex 125 MG DR tablet Commonly known as:  DEPAKOTE   ERTAPENEM SODIUM IV   warfarin 5 MG tablet Commonly known as:  COUMADIN     TAKE these medications   amiodarone 200 MG tablet Commonly known as:  PACERONE Take 1 tablet (200 mg total) by mouth daily.   atorvastatin 40 MG tablet Commonly known as:  LIPITOR TAKE 1 TABLET BY MOUTH ONCE A DAY   EQL ONE DAILY WOMENS PO Take 1 tablet by mouth daily.   escitalopram 20 MG tablet Commonly known as:  LEXAPRO TAKE 1 TABLET BY MOUTH ONCE DAILY   ferrous sulfate 325 (65 FE) MG tablet Take 325 mg by mouth daily with breakfast.   metoprolol tartrate 25 MG tablet Commonly known as:  LOPRESSOR Take 12.5 mg by mouth 2 (two) times daily.   multivitamin with minerals Tabs tablet Take 1 tablet by mouth daily.   QUEtiapine 25 MG tablet Commonly known as:  SEROQUEL Take 25 mg by mouth 2 (two) times daily.   risperiDONE 1 MG tablet Commonly known as:  RISPERDAL Take 2 mg by mouth at bedtime.   senna 8.6 MG Tabs tablet Commonly known as:  SENOKOT Take 2 tablets by mouth 2 (two) times daily.   silver sulfADIAZINE 1 % cream Commonly known as:  SILVADENE Apply 1 application topically 2 (two) times daily. sacrum   traZODone 100 MG tablet Commonly known as:  DESYREL TAKE 1 TABLET BY MOUTH AT BEDTIME   Vitamin B 12 250 MCG Lozg Take 500 mg by mouth daily.   vitamin C 500 MG tablet Commonly known as:  ASCORBIC ACID Take 500 mg by mouth daily.       If you experience worsening of your admission symptoms,  develop shortness of breath, life threatening emergency, suicidal or homicidal thoughts you must seek medical attention immediately by calling 911 or calling your MD immediately  if symptoms less severe.  You Must read complete instructions/literature along with all the possible adverse reactions/side effects for all the Medicines you take and that have been prescribed to you. Take any new Medicines after you have completely understood and accept all the possible adverse reactions/side effects.   Please note  You were cared for by a hospitalist during your hospital stay. If you have any questions about your discharge medications or the care you received while you were in the hospital after you are discharged, you can call the unit and asked to speak with the hospitalist on call if the hospitalist that took care of you is not available. Once you are discharged, your primary care physician will handle any further medical issues. Please note that NO REFILLS for any discharge medications will be authorized  once you are discharged, as it is imperative that you return to your primary care physician (or establish a relationship with a primary care physician if you do not have one) for your aftercare needs so that they can reassess your need for medications and monitor your lab values. Today   SUBJECTIVE   More awake  VITAL SIGNS:  Blood pressure 101/67, pulse 67, temperature 97.8 F (36.6 C), temperature source Oral, resp. rate 18, height 5\' 3"  (1.6 m), weight 59 kg, SpO2 97 %.  I/O:    Intake/Output Summary (Last 24 hours) at 01/02/2018 0803 Last data filed at 01/02/2018 0421 Gross per 24 hour  Intake 1107.77 ml  Output 1200 ml  Net -92.23 ml    PHYSICAL EXAMINATION:  GENERAL:  77 y.o.-year-old patient lying in the bed with no acute distress. thin EYES: Pupils equal, round, reactive to light and accommodation. No scleral icterus. Extraocular muscles intact.  HEENT: Head atraumatic,  normocephalic. Oropharynx and nasopharynx clear. Poor dentition NECK:  Supple, no jugular venous distention. No thyroid enlargement, no tenderness.  LUNGS: Normal breath sounds bilaterally, no wheezing, rales,rhonchi or crepitation. No use of accessory muscles of respiration.  CARDIOVASCULAR: S1, S2 normal. No murmurs, rubs, or gallops.  ABDOMEN: Soft, non-tender, non-distended. Bowel sounds present. No organomegaly or mass.  EXTREMITIES: No pedal edema, cyanosis, or clubbing.  NEUROLOGIC: moves all extremities well PSYCHIATRIC:  patient is alert and oriented x 3.  SKIN: No obvious rash, lesion, or ulcer.   DATA REVIEW:   CBC  Recent Labs  Lab 12/30/17 0429  WBC 7.4  HGB 10.6*  HCT 33.5*  PLT 188    Chemistries  Recent Labs  Lab 12/29/17 1134  01/01/18 0448 01/01/18 1850 01/02/18 0337  NA 169*   < > 144  --   --   K 3.6   < > 2.9* 4.5  --   CL >130*   < > 117*  --   --   CO2 25   < > 24  --   --   GLUCOSE 78   < > 103*  --   --   BUN 43*   < > 17  --   --   CREATININE 1.88*   < > 1.15*  --   --   CALCIUM 8.5*   < > 8.2*  --   --   MG  --    < > 1.7  --  1.9  AST 35  --   --   --   --   ALT 27  --   --   --   --   ALKPHOS 92  --   --   --   --   BILITOT 0.8  --   --   --   --    < > = values in this interval not displayed.    Microbiology Results   Recent Results (from the past 240 hour(s))  Urine Culture     Status: None   Collection Time: 12/29/17 11:34 AM  Result Value Ref Range Status   Specimen Description   Final    URINE, RANDOM Performed at [facility], [address] [place], [place] [address]    Special Requests   Final    Normal Performed at [facility], [address] [place], [place] [address]    Culture   Final    NO GROWTH Performed at [facility], [address] [place], [place]  [address]    Report Status 12/31/2017 FINAL  Final  MRSA PCR Screening     Status: None   Collection Time: 12/29/17  8:32 PM   Result Value Ref Range Status   MRSA by PCR NEGATIVE NEGATIVE Final    Comment:        The GeneXpert MRSA Assay (FDA approved for NASAL specimens only), is one component of a comprehensive MRSA colonization surveillance program. It is not intended to diagnose MRSA infection nor to guide or monitor treatment for MRSA infections. Performed at [facility], [address], [place], [place] [address]     RADIOLOGY:  No results found.   Management plans discussed with the patient, family and they are in agreement.  CODE STATUS:     Code Status Orders  (From admission, onward)         Start     Ordered   12/30/17 1340  Do not attempt resuscitation (DNR)  Continuous    Question Answer Comment  In the event of cardiac or respiratory ARREST Do not call a "code blue"   In the event of cardiac or respiratory ARREST Do not perform Intubation, CPR, defibrillation or ACLS   In the event of cardiac or respiratory ARREST Use medication by any route, position, wound care, and other measures to relive pain and suffering. May use oxygen, suction and manual treatment of airway obstruction as needed for comfort.      12/30/17 1339        Code Status History    Date Active Date Inactive Code Status Order ID Comments User Context   12/29/2017 1405 12/30/2017 1339 Full Code [id number]  Alford Highland, MD ED   11/15/2017 1833 11/19/2017 1429 DNR [id number]  Altamese Dilling, MD Inpatient   07/29/2017 2223 08/03/2017 1849 Full Code [id number]  Enedina Finner, MD Inpatient   06/03/2015 1132 06/09/2015 1719 Full Code [id number]  Denton Brick, MD ED   05/07/2014 2030 05/11/2014 1842 Full Code [id number]  Eldred Manges, MD Inpatient   05/06/2014 0202 05/07/2014 2030 Full Code [id number]  Gust Rung, DO Inpatient      TOTAL TIME TAKING CARE OF THIS PATIENT: 40 minutes.    Enedina Finner M.D on 01/02/2018 at 8:03 AM  Between 7am to 6pm - Pager - [phone] After 6pm go to  [email or web address] - Social research officer, government  [facility]  Office  [phone]  CC: Primary care physician; Malva Limes, MD

## 2018-01-02 NOTE — Clinical Social Work Note (Addendum)
CSW informed patient to discharge today to return to [facility]. CSW notified [facility] and Clydie BraunKaren with [facility]. Discharge information sent. Patient's husband is aware of discharge. York SpanielMonica Marquis Down MSW,LCSW [phone]

## 2018-01-02 NOTE — Discharge Instructions (Signed)
Dysphagia level 1 (PUREE) w/ NECTAR consistency liquids; aspiration precautions; feeding support and encouragement at meals.   Medication Administration: Crushed with puree(for safest swallowing)

## 2018-01-02 NOTE — Progress Notes (Signed)
Follow up visit made. Patient seen lying in bed, husband at bedside. Patient lying with eyes closed, did answer questions when spoken too. Mr. [name] [name] reported that she ate well at lunch. Plan is for discharge back to [facility] via EMS today. Signed DNR to accompany patient at discharge. Discharge summary faxed to referral. Dayna BarkerKaren Robertson RN, BSN, [facility], hospital Liaison [phone]

## 2018-01-02 NOTE — Progress Notes (Signed)
Called report to [facility]. Spoke with Bj. EMS called. Patient IV removed, vitals taken, belongings gathered. Ready for discharge.

## 2018-01-02 NOTE — Progress Notes (Signed)
Dr. Sheryle Hailiamond notified of critical INR 4.42; acknowledged; no new orders. Windy Carinaurner,Yael Angerer K, RN 4:47 AM 01/02/2018

## 2018-01-05 ENCOUNTER — Ambulatory Visit: Payer: Self-pay | Admitting: Family Medicine

## 2018-01-05 ENCOUNTER — Telehealth: Payer: Self-pay

## 2018-01-05 NOTE — Telephone Encounter (Signed)
Pt was discharged back to [facility]. Was unaware she was in a [facility]. No TCM needed.  -MM

## 2018-04-15 ENCOUNTER — Telehealth: Payer: Self-pay | Admitting: Internal Medicine

## 2018-04-15 NOTE — Telephone Encounter (Signed)
Spoke w/ Stacy @ [facility].  She reports that pt has been under their care for about 3 months and there is some confusion at to what pt's INR range should be.  Advised her that our records indicate that pt's range is 3.0-4.0 due to her CHADSVASC score & mechanical mitral valve replacement, but that pt was d[facility] to [facility] in March 2019 and they assumed care of her INR checks and coumadin dosing at that time. She would like to reduce the range due to pt's declining health and increased fall risk. Advised her to contact Dr. Dareen PianoAnderson about this, as he is dosing pt's coumadin.  Asked her to call back if she would like Dr. Okey DupreEnd & our office to resume dosing.  She is appreciative and will call back w/ any further questions or concerns.

## 2018-04-15 NOTE — Telephone Encounter (Signed)
Please call to discuss what INR readings should be.

## 2018-05-13 DEATH — deceased
